# Patient Record
Sex: Female | Born: 1990 | Race: Black or African American | Hispanic: No | Marital: Single | Smoking: Former smoker
Health system: Southern US, Community
[De-identification: ages and names within clinical notes are randomized; demographics above are authoritative.]

## PROBLEM LIST (undated history)

## (undated) ENCOUNTER — Inpatient Hospital Stay (HOSPITAL_COMMUNITY): Payer: Self-pay

## (undated) DIAGNOSIS — Z8619 Personal history of other infectious and parasitic diseases: Secondary | ICD-10-CM

## (undated) DIAGNOSIS — A549 Gonococcal infection, unspecified: Secondary | ICD-10-CM

## (undated) DIAGNOSIS — F319 Bipolar disorder, unspecified: Secondary | ICD-10-CM

## (undated) DIAGNOSIS — F329 Major depressive disorder, single episode, unspecified: Secondary | ICD-10-CM

## (undated) DIAGNOSIS — F419 Anxiety disorder, unspecified: Secondary | ICD-10-CM

## (undated) DIAGNOSIS — A539 Syphilis, unspecified: Secondary | ICD-10-CM

## (undated) DIAGNOSIS — O24419 Gestational diabetes mellitus in pregnancy, unspecified control: Secondary | ICD-10-CM

## (undated) DIAGNOSIS — F29 Unspecified psychosis not due to a substance or known physiological condition: Secondary | ICD-10-CM

## (undated) DIAGNOSIS — F32A Depression, unspecified: Secondary | ICD-10-CM

## (undated) DIAGNOSIS — O30009 Twin pregnancy, unspecified number of placenta and unspecified number of amniotic sacs, unspecified trimester: Secondary | ICD-10-CM

## (undated) HISTORY — PX: TYMPANOSTOMY TUBE PLACEMENT: SHX32

## (undated) HISTORY — DX: Gestational diabetes mellitus in pregnancy, unspecified control: O24.419

## (undated) HISTORY — DX: Personal history of other infectious and parasitic diseases: Z86.19

## (undated) HISTORY — DX: Syphilis, unspecified: A53.9

## (undated) HISTORY — DX: Unspecified psychosis not due to a substance or known physiological condition: F29

## (undated) HISTORY — DX: Gonococcal infection, unspecified: A54.9

---
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
## 2011-01-03 LAB — URINE MICROSCOPIC-ADD ON

## 2011-01-03 LAB — BASIC METABOLIC PANEL
BUN: 6
CO2: 26
Calcium: 10.1
Chloride: 103
Creatinine, Ser: 0.65

## 2011-01-03 LAB — CBC
MCHC: 34.1
MCV: 90.7
Platelets: 266
RBC: 4.41
WBC: 8.5

## 2011-01-03 LAB — RAPID URINE DRUG SCREEN, HOSP PERFORMED: Barbiturates: NOT DETECTED

## 2011-01-03 LAB — DIFFERENTIAL
Basophils Relative: 1
Eosinophils Absolute: 0.1
Neutro Abs: 5.4
Neutrophils Relative %: 63

## 2011-01-03 LAB — URINALYSIS, ROUTINE W REFLEX MICROSCOPIC
Nitrite: NEGATIVE
Specific Gravity, Urine: 1.01

## 2011-01-03 LAB — POCT PREGNANCY, URINE: Preg Test, Ur: NEGATIVE

## 2011-01-03 LAB — ETHANOL: Alcohol, Ethyl (B): <5

## 2011-01-03 LAB — SALICYLATE LEVEL: Salicylate Lvl: <4

## 2011-01-04 LAB — LIPID PANEL
Cholesterol: 126
HDL: 50
LDL Cholesterol: 67
Total CHOL/HDL Ratio: 2.5
Triglycerides: 44
VLDL: 9

## 2011-01-04 LAB — CK: Total CK: 188 — ABNORMAL HIGH

## 2011-01-04 LAB — CORTISOL-AM, BLOOD: Cortisol - AM: 8.2

## 2011-01-11 LAB — BASIC METABOLIC PANEL
BUN: 10
Creatinine, Ser: 0.62
Glucose, Bld: 98
Potassium: 4.2

## 2011-01-11 LAB — DIFFERENTIAL
Basophils Absolute: 0.1
Eosinophils Absolute: 0.1
Eosinophils Relative: 2
Lymphocytes Relative: 36

## 2011-01-11 LAB — RAPID URINE DRUG SCREEN, HOSP PERFORMED
Amphetamines: NOT DETECTED
Benzodiazepines: NOT DETECTED
Cocaine: NOT DETECTED
Opiates: NOT DETECTED
Tetrahydrocannabinol: NOT DETECTED

## 2011-01-11 LAB — URINALYSIS, ROUTINE W REFLEX MICROSCOPIC
Bilirubin Urine: NEGATIVE
Glucose, UA: NEGATIVE
Hgb urine dipstick: NEGATIVE
Nitrite: NEGATIVE
Specific Gravity, Urine: 1.029
pH: 6.5

## 2011-01-11 LAB — CBC
HCT: 39.8
Platelets: 284
RDW: 12.6

## 2011-01-18 LAB — DIFFERENTIAL
Lymphocytes Relative: 14 — ABNORMAL LOW
Lymphs Abs: 1 — ABNORMAL LOW
Neutrophils Relative %: 77 — ABNORMAL HIGH

## 2011-01-18 LAB — URINALYSIS, ROUTINE W REFLEX MICROSCOPIC
Glucose, UA: NEGATIVE
Ketones, ur: NEGATIVE
pH: 6

## 2011-01-18 LAB — RAPID URINE DRUG SCREEN, HOSP PERFORMED
Barbiturates: NOT DETECTED
Benzodiazepines: NOT DETECTED
Cocaine: NOT DETECTED
Opiates: NOT DETECTED

## 2011-01-18 LAB — BASIC METABOLIC PANEL
BUN: 10
Creatinine, Ser: 0.65

## 2011-01-18 LAB — CBC
Platelets: 281
WBC: 7

## 2011-01-18 LAB — POCT PREGNANCY, URINE: Preg Test, Ur: NEGATIVE

## 2011-01-18 LAB — ETHANOL: Alcohol, Ethyl (B): <5

## 2011-02-13 ENCOUNTER — Encounter: Payer: Self-pay | Admitting: *Deleted

## 2011-02-13 ENCOUNTER — Emergency Department (HOSPITAL_COMMUNITY)
Admission: EM | Admit: 2011-02-13 | Discharge: 2011-02-14 | Disposition: A | Discharge status: Home / Self Care | Payer: BC Managed Care – PPO | Attending: Emergency Medicine | Admitting: Emergency Medicine

## 2011-02-13 DIAGNOSIS — F329 Major depressive disorder, single episode, unspecified: Secondary | ICD-10-CM | POA: Insufficient documentation

## 2011-02-13 DIAGNOSIS — F3289 Other specified depressive episodes: Secondary | ICD-10-CM | POA: Insufficient documentation

## 2011-02-13 DIAGNOSIS — F32A Depression, unspecified: Secondary | ICD-10-CM

## 2011-02-13 DIAGNOSIS — R4586 Emotional lability: Secondary | ICD-10-CM | POA: Insufficient documentation

## 2011-02-13 HISTORY — DX: Depression, unspecified: F32.A

## 2011-02-13 HISTORY — DX: Major depressive disorder, single episode, unspecified: F32.9

## 2011-02-13 HISTORY — DX: Anxiety disorder, unspecified: F41.9

## 2011-02-13 LAB — RAPID URINE DRUG SCREEN, HOSP PERFORMED
Benzodiazepines: NOT DETECTED
Cocaine: NOT DETECTED

## 2011-02-13 LAB — COMPREHENSIVE METABOLIC PANEL
BUN: 11 mg/dL (ref 6–23)
Calcium: 9.8 mg/dL (ref 8.4–10.5)
Creatinine, Ser: 0.81 mg/dL (ref 0.50–1.10)
GFR calc Af Amer: >90 mL/min (ref >90)
GFR calc non Af Amer: >90 mL/min (ref >90)
Glucose, Bld: 90 mg/dL (ref 70–99)
Total Protein: 7.8 g/dL (ref 6.0–8.3)

## 2011-02-13 LAB — CBC
HCT: 40.2 % (ref 36.0–46.0)
Hemoglobin: 13.9 g/dL (ref 12.0–15.0)
MCH: 30.9 pg (ref 26.0–34.0)
MCHC: 34.6 g/dL (ref 30.0–36.0)
MCV: 89.3 fL (ref 78.0–100.0)

## 2011-02-13 LAB — ETHANOL: Alcohol, Ethyl (B): <11 mg/dL (ref 0–11)

## 2011-02-13 MED ORDER — ZOLPIDEM TARTRATE 5 MG PO TABS: 5.0000 mg | ORAL_TABLET | Freq: Every evening | ORAL | Status: DC | PRN

## 2011-02-13 MED ORDER — IBUPROFEN 200 MG PO TABS: 600.0000 mg | ORAL_TABLET | Freq: Three times a day (TID) | ORAL | Status: DC | PRN

## 2011-02-13 MED ORDER — ARIPIPRAZOLE 2 MG PO TABS
2.0000 mg | ORAL_TABLET | Freq: Every day | ORAL | Status: DC
  Administered 2011-02-14: 2 mg via ORAL
  Filled 2011-02-13: qty 1

## 2011-02-13 MED ORDER — ACETAMINOPHEN 325 MG PO TABS: 650.0000 mg | ORAL_TABLET | ORAL | Status: DC | PRN

## 2011-02-13 MED ORDER — LORAZEPAM 1 MG PO TABS
1.0000 mg | ORAL_TABLET | Freq: Three times a day (TID) | ORAL | Status: DC | PRN
  Filled 2011-02-13: qty 1
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
## 2011-02-14 NOTE — ED Notes (Signed)
Pt attempting to find another ride home- aware of discharge

## 2011-02-14 NOTE — ED Notes (Signed)
Pt came to desk stating she will go home with someone else other than her mother.  Discussed with ACT. "Ok for this pt to go home however".  Witnessed pt calling and talking to mother.  EDP Hosmer updated on pt current status
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
## 2011-02-14 NOTE — ED Notes (Signed)
Pt family called update given.
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
## 2011-02-14 NOTE — ED Notes (Signed)
Patient denies pain and is resting comfortably.  

## 2011-02-14 NOTE — ED Notes (Signed)
Patient is resting comfortably. 
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
## 2011-02-14 NOTE — ED Notes (Signed)
Informed mother called phone in room- she was informed she could call if she wanted to.  Pt remains calm and cooperative with care- denies SI/HI at present

## 2011-02-14 NOTE — ED Notes (Signed)
Pt up pacing in room singing loudly- asked if was anxious.  Pt denied.  Pt encouraged to verbalize her thoughts.  ACT present to discuss her plan of care.  Pt refused ativan-

## 2011-02-17 ENCOUNTER — Encounter (HOSPITAL_COMMUNITY): Payer: Self-pay | Admitting: Emergency Medicine

## 2011-02-17 ENCOUNTER — Emergency Department (HOSPITAL_COMMUNITY)
Admission: EM | Admit: 2011-02-17 | Discharge: 2011-02-17 | Disposition: A | Discharge status: Home / Self Care | Payer: BC Managed Care – PPO | Attending: Emergency Medicine | Admitting: Emergency Medicine

## 2011-02-17 DIAGNOSIS — F99 Mental disorder, not otherwise specified: Secondary | ICD-10-CM

## 2011-02-17 DIAGNOSIS — F489 Nonpsychotic mental disorder, unspecified: Secondary | ICD-10-CM | POA: Insufficient documentation

## 2011-02-17 DIAGNOSIS — R443 Hallucinations, unspecified: Secondary | ICD-10-CM | POA: Insufficient documentation

## 2011-02-17 HISTORY — DX: Bipolar disorder, unspecified: F31.9

## 2011-02-17 LAB — URINALYSIS, DIPSTICK ONLY
Bilirubin Urine: NEGATIVE
Ketones, ur: NEGATIVE mg/dL
Nitrite: NEGATIVE
Protein, ur: NEGATIVE mg/dL
Urobilinogen, UA: 0.2 mg/dL (ref 0.0–1.0)

## 2011-02-17 LAB — COMPREHENSIVE METABOLIC PANEL
ALT: 26 U/L (ref 0–35)
AST: 28 U/L (ref 0–37)
Albumin: 3.7 g/dL (ref 3.5–5.2)
Alkaline Phosphatase: 44 U/L (ref 39–117)
BUN: 8 mg/dL (ref 6–23)
Chloride: 100 mEq/L (ref 96–112)
Potassium: 3.9 mEq/L (ref 3.5–5.1)
Sodium: 136 mEq/L (ref 135–145)
Total Bilirubin: 0.2 mg/dL — ABNORMAL LOW (ref 0.3–1.2)
Total Protein: 7.4 g/dL (ref 6.0–8.3)

## 2011-02-17 LAB — ETHANOL: Alcohol, Ethyl (B): <11 mg/dL (ref 0–11)

## 2011-02-17 LAB — RAPID URINE DRUG SCREEN, HOSP PERFORMED
Amphetamines: NOT DETECTED
Benzodiazepines: NOT DETECTED
Tetrahydrocannabinol: NOT DETECTED

## 2011-02-17 LAB — CBC
MCH: 29.9 pg (ref 26.0–34.0)
Platelets: 288 10*3/uL (ref 150–400)
RBC: 4.28 MIL/uL (ref 3.87–5.11)
RDW: 12.7 % (ref 11.5–15.5)
WBC: 6.5 10*3/uL (ref 4.0–10.5)

## 2011-02-17 NOTE — ED Notes (Signed)
Pt under IVC

## 2011-02-17 NOTE — ED Notes (Signed)
Pt with ivc paperwork from [facility] pt speaking in unknown language in triage but pt will also answer questions appropriately at times. Pt states it's the baby kicking inside of me.

## 2011-02-17 NOTE — ED Notes (Signed)
Pt presenting to ed from [facility] with c/o psych evaluation per paperwork pt is highly manic and laughing inappropriately. Pt found to be acting strangely talking to herself. Per paperwork pt states she's pregnant

## 2011-02-17 NOTE — ED Provider Notes (Signed)
History     CSN: [id number] Arrival date & time: 02/17/2011  3:15 PM   First MD Initiated Contact with Patient 02/17/11 1719      Chief Complaint  Patient presents with  . Psychiatric Evaluation    (Consider location/radiation/quality/duration/timing/severity/associated sxs/prior treatment) The history is provided by the patient.  pt presents from [facility] with a pt who has been desplaying odd behavior. Singing loudly and inappropriatly. Grabbing her stuff from her room and putting it in the living room. The patient states she is pregnant because she has been feeling the baby kick (patients belly is flat).  Past Medical History  Diagnosis Date  . Depression   . Anxiety   . Bipolar 1 disorder     History reviewed. No pertinent past surgical history.  History reviewed. No pertinent family history.  History  Substance Use Topics  . Smoking status: Current Some Day Smoker  . Smokeless tobacco: Not on file  . Alcohol Use: No    OB History    Grav Para Term Preterm Abortions TAB SAB Ect Mult Living                  Review of Systems  Unable to perform ROS   Allergies  Review of patient's allergies indicates no known allergies.  Home Medications   Current Outpatient Rx  Name Route Sig Dispense Refill  . ARIPIPRAZOLE 2 MG PO TABS Oral Take 2 mg by mouth daily.        BP 101/57  Pulse 98  Temp(Src) 98.5 F (36.9 C) (Oral)  Resp 16  SpO2 100%  LMP 01/15/2011  Physical Exam  Nursing note and vitals reviewed. Constitutional: She appears well-developed and well-nourished.  HENT:  Head: Normocephalic and atraumatic.  Eyes: Conjunctivae are normal. Pupils are equal, round, and reactive to light.  Neck: Trachea normal, normal range of motion and full passive range of motion without pain. Neck supple.  Cardiovascular: Normal rate, regular rhythm and normal pulses.   Pulmonary/Chest: Effort normal and breath sounds normal. Chest wall is not dull to  percussion. She exhibits no tenderness, no crepitus, no edema, no deformity and no retraction.  Abdominal: Soft. Normal appearance and bowel sounds are normal.  Musculoskeletal: Normal range of motion.  Lymphadenopathy:       Head (right side): No submental, no submandibular, no tonsillar, no preauricular, no posterior auricular and no occipital adenopathy present.       Head (left side): No submental, no submandibular, no tonsillar, no preauricular, no posterior auricular and no occipital adenopathy present.    She has no cervical adenopathy.    She has no axillary adenopathy.  Neurological: She is alert. She has normal strength.  Skin: Skin is warm, dry and intact.  Psychiatric: Her speech is normal. She is actively hallucinating. Thought content is delusional. Cognition and memory are normal. She expresses inappropriate judgment.       Pt laughing inappropriatly. She is inattentive.    ED Course  Procedures (including critical care time)  Labs Reviewed  COMPREHENSIVE METABOLIC PANEL - Abnormal; Notable for the following:    Total Bilirubin 0.2 (*)    All other components within normal limits  URINALYSIS, DIPSTICK ONLY - Abnormal; Notable for the following:    Hgb urine dipstick SMALL (*)    Leukocytes, UA SMALL (*)    All other components within normal limits  URINE RAPID DRUG SCREEN (HOSP PERFORMED)  CBC  POCT PREGNANCY, URINE  ETHANOL  POCT PREGNANCY, URINE  No results found.   No diagnosis found.    MDM  No EKG.  Pt is medically cleared to go back to [facility].        Dorthula Matas, PA 02/17/11 Ernestina Columbia

## 2011-02-18 NOTE — ED Provider Notes (Signed)
Medical screening examination/treatment/procedure(s) were performed by non-physician practitioner and as supervising physician I was immediately available for consultation/collaboration.   Benny Lennert, MD 02/18/11 [phone]

## 2011-06-08 ENCOUNTER — Ambulatory Visit (INDEPENDENT_AMBULATORY_CARE_PROVIDER_SITE_OTHER): Payer: BC Managed Care – PPO | Admitting: Family Medicine

## 2011-06-08 VITALS — BP 111/71 | HR 73 | Temp 97.9°F | Resp 18 | Ht 66.0 in | Wt 128.0 lb

## 2011-06-08 DIAGNOSIS — Z113 Encounter for screening for infections with a predominantly sexual mode of transmission: Secondary | ICD-10-CM

## 2011-06-08 LAB — POCT WET PREP WITH KOH
Clue Cells Wet Prep HPF POC: NEGATIVE
KOH Prep POC: NEGATIVE
Trichomonas, UA: NEGATIVE
WBC Wet Prep HPF POC: NEGATIVE
Yeast Wet Prep HPF POC: NEGATIVE

## 2011-06-08 NOTE — Progress Notes (Signed)
  [facility]:  Office Visit  Chief Complaint:  Chief Complaint  Patient presents with  . std check    HPI: Cheryl Cohen is a 21 y.o. female who complains of  STD check. Did not use protection about 4 days ago. She has a ? H/o of RPR and Gonorrhea which has been treated. Denies fevers, chills, joint pain, vaginal dc, dysuria, dysparenia  Past Medical History  Diagnosis Date  . Depression   . Anxiety   . Bipolar 1 disorder   . Syphilis   . Gonorrhea    History reviewed. No pertinent past surgical history. History   Social History  . Marital Status: Single    Spouse Name: N/A    Number of Children: N/A  . Years of Education: N/A   Social History Main Topics  . Smoking status: Former Games developer  . Smokeless tobacco: None  . Alcohol Use: No  . Drug Use: No  . Sexually Active: None   Other Topics Concern  . None   Social History Narrative  . None   Family History  Problem Relation Age of Onset  . Diabetes Mother   . Hypertension Father    No Known Allergies Prior to Admission medications   Medication Sig Start Date End Date Taking? Authorizing Provider  ARIPiprazole (ABILIFY) 2 MG tablet Take 2 mg by mouth daily.     Yes Historical Provider, MD     ROS: The patient denies fevers, chills, night sweats, unintentional weight loss, chest pain, palpitations, wheezing, dyspnea on exertion, nausea, vomiting, abdominal pain, dysuria, hematuria, melena, numbness, weakness, or tingling.   All other systems have been reviewed and were otherwise negative with the exception of those mentioned in the HPI and as above.    PHYSICAL EXAM: Filed Vitals:   06/08/11 1951  BP: 111/71  Pulse: 73  Temp: 97.9 F (36.6 C)  Resp: 18   Filed Vitals:   06/08/11 1951  Height: 5\' 6"  (1.676 m)  Weight: 128 lb (58.06 kg)   Body mass index is 20.66 kg/(m^2).  General: Alert, no acute distress HEENT:  Normocephalic, atraumatic, oropharynx patent.  Cardiovascular:   Regular rate and rhythm, no rubs murmurs or gallops.  No Carotid bruits, radial pulse intact. No pedal edema.  Respiratory: Clear to auscultation bilaterally.  No wheezes, rales, or rhonchi.  No cyanosis, no use of accessory musculature GI: No organomegaly, abdomen is soft and non-tender, positive bowel sounds.  No masses. Skin: No rashes. Neurologic: Facial musculature symmetric. Psychiatric: Patient is appropriate throughout our interaction. Lymphatic: No cervical lymphadenopathy Musculoskeletal: Gait intact. Pelvic Exam: nl , no CMT, no rashes, masses, lesions on labia  ASSESSMENT/PLAN: Encounter Diagnosis  Name Primary?  . Screening for STD (sexually transmitted disease) Yes    1. Wet Prep nl 2. HIV, RPR, G/C urine, HSV pending 3. Encourage condom use  Charene Mccallister PHUONG, DO 06/10/2011 11:26 AM

## 2011-06-10 LAB — SYPHILIS: RPR W/REFLEX TO RPR TITER AND TREPONEMAL ANTIBODIES, TRADITIONAL SCREENING AND DIAGNOSIS ALGORITHM

## 2011-06-10 LAB — HIV ANTIBODY (ROUTINE TESTING W REFLEX): HIV: NONREACTIVE

## 2011-06-10 LAB — GC/CHLAMYDIA PROBE AMP, URINE
Chlamydia, Swab/Urine, PCR: NEGATIVE
GC Probe Amp, Urine: NEGATIVE

## 2011-06-12 ENCOUNTER — Telehealth: Payer: Self-pay | Admitting: Family Medicine

## 2011-06-12 LAB — HSV(HERPES SIMPLEX VRS) I + II AB-IGG
HSV 1 Glycoprotein G Ab, IgG: 0.11 IV
HSV 2 Glycoprotein G Ab, IgG: 0.18 IV

## 2011-06-12 NOTE — Telephone Encounter (Signed)
Left generic message that all test results that we did were negative. She knows what that means since we only did labwork for STD screening.

## 2011-10-27 ENCOUNTER — Inpatient Hospital Stay (HOSPITAL_COMMUNITY)
Admission: AD | Admit: 2011-10-27 | Discharge: 2011-10-27 | Disposition: A | Discharge status: Home / Self Care | Payer: BC Managed Care – PPO | Source: Ambulatory Visit | Attending: Obstetrics and Gynecology | Admitting: Obstetrics and Gynecology

## 2011-10-27 DIAGNOSIS — A5400 Gonococcal infection of lower genitourinary tract, unspecified: Secondary | ICD-10-CM | POA: Insufficient documentation

## 2011-10-27 MED ORDER — CEFTRIAXONE SODIUM 250 MG IJ SOLR
250.0000 mg | Freq: Once | INTRAMUSCULAR | Status: AC
  Administered 2011-10-27: 250 mg via INTRAMUSCULAR
  Filled 2011-10-27: qty 250

## 2011-10-27 MED ORDER — AZITHROMYCIN 1 G PO PACK
1.0000 g | PACK | Freq: Once | ORAL | Status: AC
  Administered 2011-10-27: 1 g via ORAL
  Filled 2011-10-27: qty 1

## 2011-10-27 NOTE — MAU Note (Signed)
Tolerated injection.  Handout for gonorrhea given.  Stressed importance of partner treatment.    NO sex until partner treated for at least 2 wks.

## 2012-01-11 ENCOUNTER — Ambulatory Visit (INDEPENDENT_AMBULATORY_CARE_PROVIDER_SITE_OTHER): Payer: BC Managed Care – PPO | Admitting: Internal Medicine

## 2012-01-11 ENCOUNTER — Encounter: Payer: Self-pay | Admitting: Internal Medicine

## 2012-01-11 VITALS — BP 102/66 | HR 82 | Temp 98.2°F | Ht 66.5 in | Wt 146.0 lb

## 2012-01-11 DIAGNOSIS — Z299 Encounter for prophylactic measures, unspecified: Secondary | ICD-10-CM

## 2012-01-11 DIAGNOSIS — R5381 Other malaise: Secondary | ICD-10-CM

## 2012-01-11 DIAGNOSIS — R404 Transient alteration of awareness: Secondary | ICD-10-CM

## 2012-01-11 DIAGNOSIS — R400 Somnolence: Secondary | ICD-10-CM | POA: Insufficient documentation

## 2012-01-11 DIAGNOSIS — Z833 Family history of diabetes mellitus: Secondary | ICD-10-CM

## 2012-01-11 DIAGNOSIS — Z23 Encounter for immunization: Secondary | ICD-10-CM

## 2012-01-11 DIAGNOSIS — R5383 Other fatigue: Secondary | ICD-10-CM

## 2012-01-11 DIAGNOSIS — F1910 Other psychoactive substance abuse, uncomplicated: Secondary | ICD-10-CM

## 2012-01-11 DIAGNOSIS — F1990 Other psychoactive substance use, unspecified, uncomplicated: Secondary | ICD-10-CM

## 2012-01-11 DIAGNOSIS — F319 Bipolar disorder, unspecified: Secondary | ICD-10-CM

## 2012-01-11 HISTORY — DX: Family history of diabetes mellitus: Z83.3

## 2012-01-11 LAB — CBC WITH DIFFERENTIAL/PLATELET
Basophils Relative: 0.4 % (ref 0.0–3.0)
HCT: 41.4 % (ref 36.0–46.0)
Hemoglobin: 13.6 g/dL (ref 12.0–15.0)
Lymphocytes Relative: 24.7 % (ref 12.0–46.0)
Lymphs Abs: 1.8 10*3/uL (ref 0.7–4.0)
MCHC: 32.9 g/dL (ref 30.0–36.0)
Monocytes Relative: 10.2 % (ref 3.0–12.0)
Neutro Abs: 4.5 10*3/uL (ref 1.4–7.7)
RBC: 4.42 Mil/uL (ref 3.87–5.11)
RDW: 12.9 % (ref 11.5–14.6)

## 2012-01-11 LAB — HEPATIC FUNCTION PANEL
AST: 18 U/L (ref 0–37)
Albumin: 4 g/dL (ref 3.5–5.2)
Alkaline Phosphatase: 34 U/L — ABNORMAL LOW (ref 39–117)
Total Protein: 8 g/dL (ref 6.0–8.3)

## 2012-01-11 LAB — HEMOGLOBIN A1C: Hgb A1c MFr Bld: 5.6 % (ref 4.6–6.5)

## 2012-01-11 LAB — T4, FREE: Free T4: 0.88 ng/dL (ref 0.60–1.60)

## 2012-01-11 LAB — BASIC METABOLIC PANEL
CO2: 29 mEq/L (ref 19–32)
Calcium: 9.8 mg/dL (ref 8.4–10.5)
GFR: 106.96 mL/min (ref >60.00)
Glucose, Bld: 86 mg/dL (ref 70–99)
Potassium: 4.4 mEq/L (ref 3.5–5.1)
Sodium: 138 mEq/L (ref 135–145)

## 2012-01-11 LAB — TSH: TSH: 1.06 u[IU]/mL (ref 0.35–5.50)

## 2012-01-11 LAB — LIPID PANEL: Cholesterol: 153 mg/dL (ref 0–200)

## 2012-01-11 NOTE — Progress Notes (Signed)
Subjective:    Patient ID: Cheryl Cohen, female    DOB: 1990/09/30, 21 y.o.   MRN: [id number]  HPI Patient comes in as new patient visit . Previous care was  Various places [facility]  and Dr Phillip Heal her psychiatrist who has rxed her fro bipolar disorder since 9th grade . She has been hospitalized for this with aggression mania and depression in the past   Time in fall of 2012. Here with  Mom today .Mom concern  About sleepiness.  Sleeps up to 12 hours at times   Last  Hops 2012   For 14 days and hard for school and had to fail courses  With missed school  In choir  And working . Now functioning fairly well Psych said not the meds but not really addressed recently.  No new meds  Denies subs use use . No Fever like mono  There is a family hx of dm and  Lipids and etoh issues  Review of Systems ROS:  GEN/ HEENT: No fever, significant weight changes sweats headaches vision problems hearing changes, CV/ PULM; No chest pain shortness of breath cough, syncope,edema  change in exercise tolerance. GI /GU: No adominal pain, vomiting, change in bowel habits. No blood in the stool. No significant GU symptoms. At this time has ocass lower pelvic pain  rx for gc by gyne and recheck was negative no new partners SKIN/HEME: ,no acute skin rashes suspicious lesions or bleeding. No lymphadenopathy, nodules, masses.  NEURO/ PSYCH:  No neurologic signs such as weakness numbness. IMM/ Allergy: No unusual infections.  Allergy .   REST of 12 system review negative except as per HPI Past Medical History  Diagnosis Date  . Depression   . Anxiety   . Bipolar 1 disorder     hospitalized  3 x onset rx in 9th grade   . Syphilis   . Gonorrhea   . Hx of varicella     History   Social History  . Marital Status: Single    Spouse Name: N/A    Number of Children: N/A  . Years of Education: N/A   Occupational History  . Not on file.   Social History Main Topics  . Smoking status: Never Smoker   .  Smokeless tobacco: Not on file  . Alcohol Use: No  . Drug Use: No  . Sexually Active: Not on file   Other Topics Concern  . 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 Tympanostomy tube placement     asage 2-3 yrs    Family History  Problem Relation Age of Onset  . Diabetes Mother   . Arthritis Mother   . Hypertension Father   . Alcohol abuse Father     No Known Allergies  Current Outpatient Prescriptions on File Prior to Visit  Medication Sig Dispense Refill  . Norgestim-Eth Estrad Triphasic (TRINESSA, 28, PO) Take 1 tablet by mouth daily.      . ARIPiprazole (ABILIFY) 2 MG tablet Take 2 mg by mouth daily.        now on 15 mg abilify  BP 102/66  Pulse 82  Temp 98.2 F (36.8 C) (Oral)  Ht 5' 6.5" (1.689 m)  Wt 146 lb (66.225 kg)  BMI 23.21 kg/m2  SpO2 97%  LMP 12/31/2011     Objective:   Physical Exam WDWN in nad slightly drowsy smiles nl speech and affect. HEENT: Normocephalic ;atraumatic , Eyes;  PERRL, EOMs  Full, lids and conjunctiva clear,,Ears: no deformities, canals nl, TM landmarks normal, Nose: no deformity or discharge  Mouth : OP clear without lesion or edema . Neck: Supple without adenopathy or masses or bruits Chest:  Clear to A&P without wheezes rales or rhonchi CV:  S1-S2 no gallops or murmurs peripheral perfusion is normal Abdomen:  Sof,t normal bowel sounds without hepatosplenomegaly, no guarding rebound or masses no CVA tenderness No clubbing cyanosis or edema Skin: normal capillary refill ,turgor , color: No acute rashes ,petechiae or bruising Good eye contact  MS exam: normal;  No scoliosis ,LOM , joint swelling or gait disturbance . Muscle mass is normal .     Assessment & Plan:   Sleepiness ? Hypersomnia  In setting of rx for bipolar  ? If med adding to this but should talk with  psych about this  Certainly no change in med without input.  Want to avoid relapse and interfere ing with school and job at present.  Non focal exam disc prevention risk.  Check labs for r/o metabolic thyroid etc.   Bipolar  Hx of hosp x 3 at least   Continue with psych care disc transitioning to adult psych names given.  fam hx dm  Total visit > 50% spent counseling and coordinating care  Review of data

## 2012-01-11 NOTE — Patient Instructions (Signed)
Will notify you  of labs when available. Continue impaired attention to regular exercise 150 minutes per week including aerobic and resistance. This can help sleep energy level. It is also good to prevent diabetes  Please pay attention to the sugar and processed carbohydrates in your diet and limit them. Again this is to prevent diabetes and may also help your energy level.  Would ask psychiatrist to see Abilify 15 mg could be adding to your sleepiness. At this time only a psychiatrist should change or adjust medication. Review benefit risk of any medicines.

## 2012-01-12 LAB — EPSTEIN-BARR VIRUS VCA ANTIBODY PANEL
EBV EA IgG: <5 U/mL (ref <9.0)
EBV VCA IgG: <10 U/mL (ref <18.0)
EBV VCA IgM: <10 U/mL (ref <36.0)

## 2012-01-13 ENCOUNTER — Encounter: Payer: Self-pay | Admitting: Internal Medicine

## 2012-01-13 DIAGNOSIS — F319 Bipolar disorder, unspecified: Secondary | ICD-10-CM | POA: Insufficient documentation

## 2012-01-13 DIAGNOSIS — F1910 Other psychoactive substance abuse, uncomplicated: Secondary | ICD-10-CM | POA: Insufficient documentation

## 2012-01-13 DIAGNOSIS — F1990 Other psychoactive substance use, unspecified, uncomplicated: Secondary | ICD-10-CM | POA: Insufficient documentation

## 2012-01-26 ENCOUNTER — Other Ambulatory Visit: Payer: Self-pay | Admitting: Obstetrics and Gynecology

## 2012-01-26 DIAGNOSIS — N6310 Unspecified lump in the right breast, unspecified quadrant: Secondary | ICD-10-CM

## 2012-02-09 ENCOUNTER — Ambulatory Visit
Admission: RE | Admit: 2012-02-09 | Discharge: 2012-02-09 | Disposition: A | Discharge status: Home / Self Care | Payer: Medicaid Other | Source: Ambulatory Visit | Attending: Obstetrics and Gynecology | Admitting: Obstetrics and Gynecology

## 2012-02-09 DIAGNOSIS — N6310 Unspecified lump in the right breast, unspecified quadrant: Secondary | ICD-10-CM

## 2012-07-28 ENCOUNTER — Ambulatory Visit (INDEPENDENT_AMBULATORY_CARE_PROVIDER_SITE_OTHER): Payer: BC Managed Care – PPO | Admitting: Emergency Medicine

## 2012-07-28 VITALS — BP 110/70 | HR 101 | Temp 97.9°F | Resp 16 | Ht 66.0 in | Wt 137.8 lb

## 2012-07-28 DIAGNOSIS — R102 Pelvic and perineal pain: Secondary | ICD-10-CM

## 2012-07-28 DIAGNOSIS — M25559 Pain in unspecified hip: Secondary | ICD-10-CM

## 2012-07-28 LAB — POCT URINALYSIS DIPSTICK
Ketones, UA: 15
Protein, UA: 300
Urobilinogen, UA: 1
pH, UA: 6

## 2012-07-28 LAB — POCT UA - MICROSCOPIC ONLY
Casts, Ur, LPF, POC: NEGATIVE
Crystals, Ur, HPF, POC: NEGATIVE
Yeast, UA: NEGATIVE

## 2012-07-28 MED ORDER — PHENAZOPYRIDINE HCL 200 MG PO TABS
200.0000 mg | ORAL_TABLET | Freq: Three times a day (TID) | ORAL | Status: DC | PRN
Start: 2012-07-28 — End: 2013-04-23

## 2012-07-28 MED ORDER — SULFAMETHOXAZOLE-TRIMETHOPRIM 800-160 MG PO TABS: 1.0000 | ORAL_TABLET | Freq: Two times a day (BID) | ORAL | Status: DC

## 2012-07-28 NOTE — Patient Instructions (Addendum)
Urinary Tract Infection Urinary tract infections (UTIs) can develop anywhere along your urinary tract. Your urinary tract is your body's drainage system for removing wastes and extra water. Your urinary tract includes two kidneys, two ureters, a bladder, and a urethra. Your kidneys are a pair of bean-shaped organs. Each kidney is about the size of your fist. They are located below your ribs, one on each side of your spine. CAUSES Infections are caused by microbes, which are microscopic organisms, including fungi, viruses, and bacteria. These organisms are so small that they can only be seen through a microscope. Bacteria are the microbes that most commonly cause UTIs. SYMPTOMS  Symptoms of UTIs may vary by age and gender of the patient and by the location of the infection. Symptoms in young women typically include a frequent and intense urge to urinate and a painful, burning feeling in the bladder or urethra during urination. Older women and men are more likely to be tired, shaky, and weak and have muscle aches and abdominal pain. A fever may mean the infection is in your kidneys. Other symptoms of a kidney infection include pain in your back or sides below the ribs, nausea, and vomiting. DIAGNOSIS To diagnose a UTI, your caregiver will ask you about your symptoms. Your caregiver also will ask to provide a urine sample. The urine sample will be tested for bacteria and white blood cells. White blood cells are made by your body to help fight infection. TREATMENT  Typically, UTIs can be treated with medication. Because most UTIs are caused by a bacterial infection, they usually can be treated with the use of antibiotics. The choice of antibiotic and length of treatment depend on your symptoms and the type of bacteria causing your infection. HOME CARE INSTRUCTIONS  If you were prescribed antibiotics, take them exactly as your caregiver instructs you. Finish the medication even if you feel better after you  have only taken some of the medication.  Drink enough water and fluids to keep your urine clear or pale yellow.  Avoid caffeine, tea, and carbonated beverages. They tend to irritate your bladder.  Empty your bladder often. Avoid holding urine for long periods of time.  Empty your bladder before and after sexual intercourse.  After a bowel movement, women should cleanse from front to back. Use each tissue only once. SEEK MEDICAL CARE IF:   You have back pain.  You develop a fever.  Your symptoms do not begin to resolve within 3 days. SEEK IMMEDIATE MEDICAL CARE IF:   You have severe back pain or lower abdominal pain.  You develop chills.  You have nausea or vomiting.  You have continued burning or discomfort with urination. MAKE SURE YOU:   Understand these instructions.  Will watch your condition.  Will get help right away if you are not doing well or get worse. Document Released: 12/28/2004 Document Revised: 09/19/2011 Document Reviewed: 04/28/2011 Exit[employer] Patient Information 2013 [employer].  

## 2012-07-28 NOTE — Progress Notes (Signed)
[facility] [address] [place] [address] [phone]  Date:  07/28/2012   Name:  Cheryl Cohen   DOB:  Mar 31, 1991   MRN:  [id number]  PCP:  Lorretta Harp, MD    Chief Complaint: Abdominal Pain, Back Pain and Possible Pregnancy   History of Present Illness:  Cheryl Cohen is a 22 y.o. very pleasant female patient who presents with the following:  Friday developed pain in lower abdomen and pelvis. Radiates into back.  No dysuria or frequency but having urgency.  On depo provera and current on shots.  Some spotting now. No dyspareunia or discharge.  No nausea or vomiting.  No fever or chills.  Pain worse with standing and walking.   No stool change.  The patient has no complaint of blood, mucous, or pus in her stools.   Patient Active Problem List  Diagnosis  . Sleepiness  . Fatigue  . Family history of diabetes mellitus type II  . Bipolar 1 disorder  . Psychotropic drug use    Past Medical History  Diagnosis Date  . Depression   . Anxiety   . Bipolar 1 disorder     hospitalized  3 x onset rx in 9th grade   . Syphilis   . Gonorrhea   . Hx of varicella     Past Surgical History  Procedure Laterality Date  . Tympanostomy tube placement      asage 2-3 yrs    History  Substance Use Topics  . Smoking status: Never Smoker   . Smokeless tobacco: Not on file  . Alcohol Use: No    Family History  Problem Relation Age of Onset  . Diabetes Mother   . Arthritis Mother   . Hypertension Father   . Alcohol abuse Father     No Known Allergies  Medication list has been reviewed and updated.  Current Outpatient Prescriptions on File Prior to Visit  Medication Sig Dispense Refill  . ARIPiprazole (ABILIFY) 2 MG tablet Take 2 mg by mouth daily.        Lorita Officer Triphasic (TRINESSA, 28, PO) Take 1 tablet by mouth daily.       No current facility-administered medications on file prior to visit.    Review of Systems:  As  per HPI, otherwise negative.    Physical Examination: Filed Vitals:   07/28/12 1042  BP: 110/70  Pulse: 101  Temp: 97.9 F (36.6 C)  Resp: 16   Filed Vitals:   07/28/12 1042  Height: 5\' 6"  (1.676 m)  Weight: 137 lb 12.8 oz (62.506 kg)   Body mass index is 22.25 kg/(m^2). Ideal Body Weight: Weight in (lb) to have BMI = 25: 154.6  GEN: WDWN, NAD, Non-toxic, A & O x 3 HEENT: Atraumatic, Normocephalic. Neck supple. No masses, No LAD. Ears and Nose: No external deformity. CV: RRR, No M/G/R. No JVD. No thrill. No extra heart sounds. PULM: CTA B, no wheezes, crackles, rhonchi. No retractions. No resp. distress. No accessory muscle use. ABD: S, NT, ND, +BS. No rebound. No HSM. EXTR: No c/c/e NEURO Normal gait.  PSYCH: Normally interactive. Conversant. Not depressed or anxious appearing.  Calm demeanor.    Assessment and Plan: Acute cystitis Septra Pyridium   Signed,  Phillips Odor, MD   Results for orders placed in visit on 07/28/12  POCT UA - MICROSCOPIC ONLY      Result Value Range   WBC, Ur, HPF, POC TNTC  RBC, urine, microscopic 10-15     Bacteria, U Microscopic 1+     Mucus, UA small     Epithelial cells, urine per micros 3-6     Crystals, Ur, HPF, POC neg     Casts, Ur, LPF, POC neg     Yeast, UA neg    POCT URINALYSIS DIPSTICK      Result Value Range   Color, UA yellow     Clarity, UA cloudy     Glucose, UA neg     Bilirubin, UA neg     Ketones, UA 15     Spec Grav, UA 1.025     Blood, UA large     pH, UA 6.0     Protein, UA 300     Urobilinogen, UA 1.0     Nitrite, UA postive     Leukocytes, UA moderate (2+)    POCT URINE PREGNANCY      Result Value Range   Preg Test, Ur Negative

## 2012-07-29 ENCOUNTER — Telehealth: Payer: Self-pay | Admitting: Internal Medicine

## 2012-07-29 NOTE — Telephone Encounter (Signed)
Cheryl Cohen/Mother's call follows UC 07/28/12 diagnosed with UTI, T 100.  1 now, back pain continues; using Ibuprofen and pain med, antibiotic.  Wants to make sure this is normal.

## 2012-07-29 NOTE — Telephone Encounter (Signed)
Patient notified to contact urgent care for advise since she was treated there.  Was advised to come in and be seen in no better in a few days.  Advised she has only been on the antibiotic for 1 day.  Full effect has not began.  Not surprised she is having back pain today.  Again, advised to come in for an office visit in a few days if not doing better or getting worse.

## 2013-01-01 ENCOUNTER — Ambulatory Visit (INDEPENDENT_AMBULATORY_CARE_PROVIDER_SITE_OTHER): Payer: BC Managed Care – PPO | Admitting: Emergency Medicine

## 2013-01-01 VITALS — BP 100/60 | HR 90 | Temp 98.0°F | Resp 16 | Ht 66.5 in | Wt 131.2 lb

## 2013-01-01 DIAGNOSIS — J039 Acute tonsillitis, unspecified: Secondary | ICD-10-CM

## 2013-01-01 MED ORDER — PENICILLIN V POTASSIUM 500 MG PO TABS: 500.0000 mg | ORAL_TABLET | Freq: Four times a day (QID) | ORAL | Status: DC

## 2013-01-01 NOTE — Progress Notes (Signed)
[facility] [address] [place]  [place] [address] [phone]  Date:  01/01/2013   Name:  Cheryl Cohen   DOB:  Nov 03, 1990   MRN:  [id number]  PCP:  Lorretta Harp, MD    Chief Complaint: Sore Throat   History of Present Illness:  Cheryl Cohen is a 22 y.o. very pleasant female patient who presents with the following:  Ill for the week with sore throat and swollen lymph nodes.  Pain with swallowing.  No rash or cough.  No wheezing or shortness of breath.  No coryza.  Thinks she had a fever but none measured.  No improvement with over the counter medications or other home remedies. Denies other complaint or health concern today.   Patient Active Problem List   Diagnosis Date Noted  . Psychotropic drug use 01/13/2012  . Bipolar 1 disorder   . Sleepiness 01/11/2012  . Fatigue 01/11/2012  . Family history of diabetes mellitus type II 01/11/2012    Past Medical History  Diagnosis Date  . Depression   . Anxiety   . Bipolar 1 disorder     hospitalized  3 x onset rx in 9th grade   . Syphilis   . Gonorrhea   . Hx of varicella     Past Surgical History  Procedure Laterality Date  . Tympanostomy tube placement      asage 2-3 yrs    History  Substance Use Topics  . Smoking status: Never Smoker   . Smokeless tobacco: Not on file  . Alcohol Use: No    Family History  Problem Relation Age of Onset  . Diabetes Mother   . Arthritis Mother   . Hypertension Father   . Alcohol abuse Father     No Known Allergies  Medication list has been reviewed and updated.  Current Outpatient Prescriptions on File Prior to Visit  Medication Sig Dispense Refill  . ARIPiprazole (ABILIFY) 2 MG tablet Take 2 mg by mouth daily.        . medroxyPROGESTERone (DEPO-PROVERA) 150 MG/ML injection Inject 150 mg into the muscle every 3 (three) months.      Lorita Officer Triphasic (TRINESSA, 28, PO) Take 1 tablet by mouth daily.      . phenazopyridine (PYRIDIUM)  200 MG tablet Take 1 tablet (200 mg total) by mouth 3 (three) times daily as needed for pain.  6 tablet  0  . sulfamethoxazole-trimethoprim (BACTRIM DS,SEPTRA DS) 800-160 MG per tablet Take 1 tablet by mouth 2 (two) times daily.  20 tablet  0   No current facility-administered medications on file prior to visit.    Review of Systems:  As per HPI, otherwise negative.    Physical Examination: Filed Vitals:   01/01/13 1316  BP: 100/60  Pulse: 90  Temp: 98 F (36.7 C)  Resp: 16   Filed Vitals:   01/01/13 1316  Height: 5' 6.5" (1.689 m)  Weight: 131 lb 3.2 oz (59.512 kg)   Body mass index is 20.86 kg/(m^2). Ideal Body Weight: Weight in (lb) to have BMI = 25: 156.9  GEN: WDWN, NAD, Non-toxic, A & O x 3 HEENT: Atraumatic, Normocephalic. Neck supple. No masses, anterior cervical LAD.  Right exudative tonsillitis Ears and Nose: No external deformity. CV: RRR, No M/G/R. No JVD. No thrill. No extra heart sounds. PULM: CTA B, no wheezes, crackles, rhonchi. No retractions. No resp. distress. No accessory muscle use. ABD: S, NT, ND, +BS. No rebound. No HSM.  EXTR: No c/c/e NEURO Normal gait.  PSYCH: Normally interactive. Conversant. Not depressed or anxious appearing.  Calm demeanor.    Assessment and Plan: Tonsillitis Pen v k Tylenol   Signed,  Phillips Odor, MD

## 2013-01-01 NOTE — Patient Instructions (Addendum)
Tonsillitis  Tonsils are lumps of lymphoid tissues at the back of the throat. Each tonsil has 20 crevices (crypts). Tonsils help fight nose and throat infections and keep infection from spreading to other parts of the body for the first 18 months of life. Tonsillitis is an infection of the throat that causes the tonsils to become red, tender, and swollen.  CAUSES  Sudden and, if treated, temporary (acute) tonsillitis is usually caused by infection with streptococcal bacteria. Long lasting (chronic) tonsillitis occurs when the crypts of the tonsils become filled with pieces of food and bacteria, which makes it easy for the tonsils to become constantly infected.  SYMPTOMS   Symptoms of tonsillitis include:   A sore throat.   White patches on the tonsils.   Fever.   Tiredness.  DIAGNOSIS  Tonsillitis can be diagnosed through a physical exam. Diagnosis can be confirmed with the results of lab tests, including a throat culture.  TREATMENT   The goals of tonsillitis treatment include the reduction of the severity and duration of symptoms, prevention of associated conditions, and prevention of disease transmission. Tonsillitis caused by bacteria can be treated with antibiotics. Usually, treatment with antibiotics is started before the cause of the tonsillitis is known. However, if it is determined that the cause is not bacterial, antibiotics will not treat the tonsillitis. If attacks of tonsillitis are severe and frequent, your caregiver may recommend surgery to remove the tonsils (tonsillectomy).  HOME CARE INSTRUCTIONS    Rest as much as possible and get plenty of sleep.   Drink plenty of fluids. While the throat is very sore, eat soft foods or liquids, such as sherbet, soups, or instant breakfast drinks.   Eat frozen ice pops.   Older children and adults may gargle with a warm or cold liquid to help soothe the throat. Mix 1 teaspoon of salt in 1 cup of water.   Other family members who also develop a sore  throat or fever should have a medical exam or throat culture.   Only take over-the-counter or prescription medicines for pain, discomfort, or fever as directed by your caregiver.   If you are given antibiotics, take them as directed. Finish them even if you start to feel better.  SEEK MEDICAL CARE IF:    Your baby is older than 3 months with a rectal temperature of 100.5 F (38.1 C) or higher for more than 1 day.   Large, tender lumps develop in your neck.   A rash develops.   Green, yellow-brown, or bloody substance is coughed up.   You are unable to swallow liquids or food for 24 hours.   Your child is unable to swallow food or liquids for 12 hours.  SEEK IMMEDIATE MEDICAL CARE IF:    You develop any new symptoms such as vomiting, severe headache, stiff neck, chest pain, or trouble breathing or swallowing.   You have severe throat pain along with drooling or voice changes.   You have severe pain, unrelieved with recommended medications.   You are unable to fully open the mouth.   You develop redness, swelling, or severe pain anywhere in the neck.   You have a fever.   Your baby is older than 3 months with a rectal temperature of 102 F (38.9 C) or higher.   Your baby is 3 months old or younger with a rectal temperature of 100.4 F (38 C) or higher.  MAKE SURE YOU:    Understand these instructions.   Will 

## 2013-04-23 ENCOUNTER — Ambulatory Visit (INDEPENDENT_AMBULATORY_CARE_PROVIDER_SITE_OTHER): Payer: BC Managed Care – PPO | Admitting: Physician Assistant

## 2013-04-23 VITALS — BP 96/66 | HR 109 | Temp 98.5°F | Resp 16 | Ht 66.0 in | Wt 126.0 lb

## 2013-04-23 DIAGNOSIS — R059 Cough, unspecified: Secondary | ICD-10-CM

## 2013-04-23 DIAGNOSIS — R5381 Other malaise: Secondary | ICD-10-CM

## 2013-04-23 DIAGNOSIS — R5383 Other fatigue: Secondary | ICD-10-CM

## 2013-04-23 DIAGNOSIS — D7289 Other specified disorders of white blood cells: Secondary | ICD-10-CM

## 2013-04-23 DIAGNOSIS — R05 Cough: Secondary | ICD-10-CM

## 2013-04-23 DIAGNOSIS — J020 Streptococcal pharyngitis: Secondary | ICD-10-CM

## 2013-04-23 DIAGNOSIS — J029 Acute pharyngitis, unspecified: Secondary | ICD-10-CM

## 2013-04-23 LAB — POCT CBC
Granulocyte percent: 76.3 %G (ref 37–80)
HCT, POC: 44.5 % (ref 37.7–47.9)
HEMOGLOBIN: 14.3 g/dL (ref 12.2–16.2)
LYMPH, POC: 2 (ref 0.6–3.4)
MCH: 30.4 pg (ref 27–31.2)
MCHC: 32.1 g/dL (ref 31.8–35.4)
MCV: 94.7 fL (ref 80–97)
MID (CBC): 1.1 — AB (ref 0–0.9)
MPV: 8.2 fL (ref 0–99.8)
POC Granulocyte: 10.1 — AB (ref 2–6.9)
POC LYMPH PERCENT: 15.2 %L (ref 10–50)
POC MID %: 8.5 % (ref 0–12)
Platelet Count, POC: 212 10*3/uL (ref 142–424)
RBC: 4.7 M/uL (ref 4.04–5.48)
RDW, POC: 13.4 %
WBC: 13.2 10*3/uL — AB (ref 4.6–10.2)

## 2013-04-23 LAB — POCT RAPID STREP A (OFFICE): Rapid Strep A Screen: POSITIVE — AB

## 2013-04-23 MED ORDER — AMOXICILLIN 875 MG PO TABS: 875.0000 mg | ORAL_TABLET | Freq: Two times a day (BID) | ORAL | Status: DC

## 2013-04-23 NOTE — Progress Notes (Signed)
Subjective:    Patient ID: Cheryl Cohen, female    DOB: July 25, 1990, 23 y.o.   MRN: [id number]  HPI Patient ID: Cheryl GrahamMiya B Cohen MRN: [id number], DOB: July 25, 1990, 22 y.o. Date of Encounter: 04/23/2013, 9:46 AM  Primary Physician: Lorretta HarpPANOSH,WANDA KOTVAN, MD  Chief Complaint:  Chief Complaint  Patient presents with  . Sore Throat    since this weekend, exposed to URI    HPI: 23 y.o. female presents with a 3-4 day history of sore throat, post nasal drip, cough, otalgia, fever, chills, and fatigue. Subjective fever and chills. No congestion, rhinorrhea, sinus pressure, or headache. Cough is not productive and not associated with time of day. No SOB or wheezing. Normal hearing. No GI complaints. Able to swallow saliva, but hurts to do so. Right side of the throat hurts worse than the left. Decreased appetite secondary to sore throat. Two known sick contacts, both with URI's. No known strep contacts.   PMH: Past Medical History  Diagnosis Date  . Depression   . Anxiety   . Bipolar 1 disorder     hospitalized  3 x onset rx in 9th grade   . Syphilis   . Gonorrhea   . Hx of varicella      Home Meds: Prior to Admission medications   Medication Sig Start Date End Date Taking? Authorizing Provider  ARIPiprazole (ABILIFY) 2 MG tablet Take 2 mg by mouth daily.     Yes Historical Provider, MD  medroxyPROGESTERone (DEPO-PROVERA) 150 MG/ML injection Inject 150 mg into the muscle every 3 (three) months.   No Historical Provider, MD  Norgestim-Eth Estrad Triphasic (TRINESSA, 28, PO) Take 1 tablet by mouth daily.   No Bing Plumehomas F Henley, MD    Allergies: No Known Allergies  History   Social History  . Marital Status: Single    Spouse Name: N/A    Number of Children: N/A  . Years of Education: N/A   Occupational History  . Not on file.   Social History Main Topics  . Smoking status: Current Every Day Smoker    Types: Cigarettes  . Smokeless tobacco: Not on file     Comment: 1 cigarette a  day  . Alcohol Use: No  . Drug Use: No  . Sexual Activity: Not on file   Other Topics Concern  . 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 Negative for appetite change.  HENT: Positive for ear pain, postnasal drip and sore throat. Negative for congestion, rhinorrhea and sinus pressure.        Otalgia-right more than left  Respiratory: Positive for cough. Negative for shortness of breath and wheezing.        Dry cough. Cough not associate with time of day.  Gastrointestinal: Negative for nausea, vomiting and diarrhea.  Musculoskeletal: Positive for myalgias.  Neurological: Negative for headaches.       Objective:   Physical Exam  Physical Exam: Blood pressure 96/66, pulse 109, temperature 98.5 F (36.9 C), temperature source Oral, resp. rate 16, height 5\' 6"  (1.676 m), weight 126 lb (57.153 kg), last menstrual period 04/07/2013, SpO2 98.00%., Body mass index is  20.35 kg/(m^2). General: Well developed, well nourished, in no acute distress. Head: Normocephalic, atraumatic, eyes without discharge, sclera non-icteric, nares are patent. Bilateral auditory canals clear, TM's are without perforation, pearly grey with reflective cone of light bilaterally. No sinus TTP. Oral cavity moist, dentition normal. Posterior pharynx erythematous with post nasal drip. Tonsils 2+ bilaterally. No peritonsillar abscess or tonsillar exudate. Uvula midline.  Neck: Supple. No thyromegaly. Full ROM. Lymph nodes: less than 2 cm AC bilaterally. Lungs: Clear bilaterally to auscultation without wheezes, rales, or rhonchi. Breathing is unlabored. Heart: RRR with S1 S2. No murmurs, rubs, or gallops appreciated. Msk:  Strength and tone normal for age. Extremities: No  clubbing or cyanosis. No edema. Neuro: Alert and oriented X 3. Moves all extremities spontaneously. CNII-XII grossly in tact. Psych:  Responds to questions appropriately with a normal affect.   Labs: Results for orders placed in visit on 04/23/13  POCT CBC      Result Value Range   WBC 13.2 (*) 4.6 - 10.2 K/uL   Lymph, poc 2.0  0.6 - 3.4   POC LYMPH PERCENT 15.2  10 - 50 %L   MID (cbc) 1.1 (*) 0 - 0.9   POC MID % 8.5  0 - 12 %M   POC Granulocyte 10.1 (*) 2 - 6.9   Granulocyte percent 76.3  37 - 80 %G   RBC 4.70  4.04 - 5.48 M/uL   Hemoglobin 14.3  12.2 - 16.2 g/dL   HCT, POC 81.1  91.4 - 47.9 %   MCV 94.7  80 - 97 fL   MCH, POC 30.4  27 - 31.2 pg   MCHC 32.1  31.8 - 35.4 g/dL   RDW, POC 78.2     Platelet Count, POC 212  142 - 424 K/uL   MPV 8.2  0 - 99.8 fL  POCT RAPID STREP A (OFFICE)      Result Value Range   Rapid Strep A Screen Positive (*) Negative       Assessment & Plan:  23 year old female with strep pharyngitis and leukocytosis  -Amoxicillin 875 mg 1 po bid #20 no RF -New toothbrush -Tylenol/Motrin -Rest/fluids -RTC precautions   Eula Listen, MHS, PA-C [facility] [address] [place], [place] [address] [phone] [facility] 04/23/2013 9:46 AM

## 2013-05-02 ENCOUNTER — Ambulatory Visit (INDEPENDENT_AMBULATORY_CARE_PROVIDER_SITE_OTHER): Payer: BC Managed Care – PPO | Admitting: Family Medicine

## 2013-05-02 VITALS — BP 120/80 | HR 99 | Temp 99.0°F | Resp 17 | Ht 66.5 in | Wt 130.0 lb

## 2013-05-02 DIAGNOSIS — Z880 Allergy status to penicillin: Secondary | ICD-10-CM

## 2013-05-02 DIAGNOSIS — L239 Allergic contact dermatitis, unspecified cause: Secondary | ICD-10-CM

## 2013-05-02 DIAGNOSIS — L259 Unspecified contact dermatitis, unspecified cause: Secondary | ICD-10-CM

## 2013-05-02 MED ORDER — PREDNISONE 20 MG PO TABS: ORAL_TABLET | ORAL | Status: DC

## 2013-05-02 NOTE — Patient Instructions (Signed)
Take prednisone in a tapered dose fashion as directed. 3 daily for 2 days, then daily for 2 days, then one daily for 2 days. Dissection in the morning.  Take over-the-counter or Zyrtec (citirizine) one daily for itching. Can take twice daily for a couple of days if necessary. Higher doses may make you drowsy.  Return if any significant worsening of the rash. Specifically if any wheezing or sores or rash in the mouth or major worsening of a rash on the skin

## 2013-05-02 NOTE — Progress Notes (Signed)
Subjective: 23 year old lady who was started on amoxicillin about 8 days ago for a strep throat. She was strep positive. She took amoxicillin and yesterday developed a itching rash. Some on her face and trunk and arms and thighs. No other major symptoms from it. She did take Benadryl yesterday. She has some acne which accounts for some of the rash on her face and chest, but the remainder is the new rash. It itches  Objective: TMs normal. Throat clear. No oral lesions. Pharyngitis seems to be resolved. Neck supple without significant nodes. Chest is clear to auscultation. Heart regular without murmurs. Her skin shows some acne on the face making it a little more difficult to assess. There are red blotchy rash areas on the face neck chest abdomen, back or and arms.  Assessment: Amoxicillin allergy(penicillin class allergy) Allergic dermatitis  Plan: Work excuse for today and tomorrow  Taper of prednisone Over-the-counter Zyrtec Return if

## 2013-07-28 ENCOUNTER — Ambulatory Visit (INDEPENDENT_AMBULATORY_CARE_PROVIDER_SITE_OTHER): Payer: BC Managed Care – PPO | Admitting: Family Medicine

## 2013-07-28 VITALS — BP 118/68 | HR 95 | Temp 98.5°F | Resp 16 | Ht 66.5 in | Wt 132.4 lb

## 2013-07-28 DIAGNOSIS — J029 Acute pharyngitis, unspecified: Secondary | ICD-10-CM

## 2013-07-28 LAB — POCT RAPID STREP A (OFFICE): Rapid Strep A Screen: NEGATIVE

## 2013-07-28 NOTE — Patient Instructions (Signed)
Ibuprofen 400-600 mg every 8 hours as needed for pain/fever Chloraseptic spray (over the counter) as directed for throat pain or warm salt water gargles Any over the counter anti-histamine such as zyrtec, claritin, allegra (generic fine) once a day for congestion.  Pharyngitis Pharyngitis is redness, pain, and swelling (inflammation) of your pharynx.  CAUSES  Pharyngitis is usually caused by infection. Most of the time, these infections are from viruses (viral) and are part of a cold. However, sometimes pharyngitis is caused by bacteria (bacterial). Pharyngitis can also be caused by allergies. Viral pharyngitis may be spread from person to person by coughing, sneezing, and personal items or utensils (cups, forks, spoons, toothbrushes). Bacterial pharyngitis may be spread from person to person by more intimate contact, such as kissing.  SIGNS AND SYMPTOMS  Symptoms of pharyngitis include:   Sore throat.   Tiredness (fatigue).   Low-grade fever.   Headache.  Joint pain and muscle aches.  Skin rashes.  Swollen lymph nodes.  Plaque-like film on throat or tonsils (often seen with bacterial pharyngitis). DIAGNOSIS  Your health care provider will ask you questions about your illness and your symptoms. Your medical history, along with a physical exam, is often all that is needed to diagnose pharyngitis. Sometimes, a rapid strep test is done. Other lab tests may also be done, depending on the suspected cause.  TREATMENT  Viral pharyngitis will usually get better in 3 4 days without the use of medicine. Bacterial pharyngitis is treated with medicines that kill germs (antibiotics).  HOME CARE INSTRUCTIONS   Drink enough water and fluids to keep your urine clear or pale yellow.   Only take over-the-counter or prescription medicines as directed by your health care provider:   If you are prescribed antibiotics, make sure you finish them even if you start to feel better.   Do not take  aspirin.   Get lots of rest.   Gargle with 8 oz of salt water ( tsp of salt per 1 qt of water) as often as every 1 2 hours to soothe your throat.   Throat lozenges (if you are not at risk for choking) or sprays may be used to soothe your throat. SEEK MEDICAL CARE IF:   You have large, tender lumps in your neck.  You have a rash.  You cough up green, yellow-brown, or bloody spit. SEEK IMMEDIATE MEDICAL CARE IF:   Your neck becomes stiff.  You drool or are unable to swallow liquids.  You vomit or are unable to keep medicines or liquids down.  You have severe pain that does not go away with the use of recommended medicines.  You have trouble breathing (not caused by a stuffy nose). MAKE SURE YOU:   Understand these instructions.  Will watch your condition.  Will get help right away if you are not doing well or get worse. Document Released: 03/20/2005 Document Revised: 01/08/2013 Document Reviewed: 11/25/2012 [facility] Patient Information 2014 [place] [place].

## 2013-07-28 NOTE — Progress Notes (Signed)
I have discussed this case with Ms. Gessner, NP and agree.  

## 2013-07-28 NOTE — Progress Notes (Signed)
   Subjective:    Patient ID: Cheryl Cohen, female    DOB: 05-May-1990, 23 y.o.   MRN: [id number]  HPI Patient presents today with 3 day history of sore throat. Body aches, fever to 101. No recent sick contacts. No OTC medications.  Patient currently sexually active. Using depo-provera for contraception. Is not currently working. Is planning on attending classes at [employer] in the fall. Smokes several cigarettes daily. Ambilify working well for her, no side effects.  Review of Systems +headache, +ear pain, +cough, +sneezing, no SOB, +nasal drainage, no nausea/vomiting.     Objective:   Physical Exam  Vitals reviewed. Constitutional: She appears well-developed and well-nourished.  HENT:  Head: Normocephalic and atraumatic.  Right Ear: Tympanic membrane, external ear and ear canal normal.  Left Ear: Tympanic membrane, external ear and ear canal normal.  Nose: Mucosal edema and rhinorrhea present. Right sinus exhibits no maxillary sinus tenderness and no frontal sinus tenderness. Left sinus exhibits no maxillary sinus tenderness and no frontal sinus tenderness.  Mouth/Throat: Uvula is midline and mucous membranes are normal. Oropharyngeal exudate, posterior oropharyngeal edema and posterior oropharyngeal erythema present. No tonsillar abscesses.  Eyes: Conjunctivae are normal. Right eye exhibits no discharge. Left eye exhibits no discharge.  Neck: Normal range of motion. Neck supple.  Cardiovascular: Normal rate, regular rhythm and normal heart sounds.   Pulmonary/Chest: Effort normal and breath sounds normal.  Lymphadenopathy:    She has no cervical adenopathy.  Skin: Skin is warm and dry.  Psychiatric: She has a normal mood and affect. Her behavior is normal. Judgment and thought content normal.   Results for orders placed in visit on 07/28/13  POCT RAPID STREP A (OFFICE)      Result Value Ref Range   Rapid Strep A Screen Negative  Negative   Patient given 600 mg ibuprofen while in  clinic with some resolution of throat pain.    Assessment & Plan:  1. Acute pharyngitis - POCT rapid strep A- negative  - Culture, Group A Strep  -Follow up if no improvement in 5-7 days, increased pain, difficulty swallowing, fever greater than 101 for 2 days.  -Written and verbal instructions given for symptomatic treatment, including: Ibuprofen 400-600 mg every 8 hours as needed for pain/fever Chloraseptic spray (over the counter) as directed for throat pain or warm salt water gargles Any over the counter anti-histamine such as zyrtec, claritin, allegra (generic fine) once a day for congestion.   Emi Cheryl B. Gessner, FNP-BC  [facility]  07/28/2013 9:51 AM

## 2013-07-30 LAB — CULTURE, GROUP A STREP: ORGANISM ID, BACTERIA: NORMAL

## 2013-10-06 ENCOUNTER — Emergency Department (HOSPITAL_COMMUNITY)
Admission: EM | Admit: 2013-10-06 | Discharge: 2013-10-06 | Disposition: A | Discharge status: Home / Self Care | Payer: Medicaid Other | Attending: Emergency Medicine | Admitting: Emergency Medicine

## 2013-10-06 ENCOUNTER — Encounter (HOSPITAL_COMMUNITY): Payer: Self-pay | Admitting: Emergency Medicine

## 2013-10-06 DIAGNOSIS — F17200 Nicotine dependence, unspecified, uncomplicated: Secondary | ICD-10-CM | POA: Insufficient documentation

## 2013-10-06 DIAGNOSIS — Z3202 Encounter for pregnancy test, result negative: Secondary | ICD-10-CM | POA: Insufficient documentation

## 2013-10-06 DIAGNOSIS — Z8619 Personal history of other infectious and parasitic diseases: Secondary | ICD-10-CM | POA: Insufficient documentation

## 2013-10-06 DIAGNOSIS — Z880 Allergy status to penicillin: Secondary | ICD-10-CM | POA: Insufficient documentation

## 2013-10-06 DIAGNOSIS — F3130 Bipolar disorder, current episode depressed, mild or moderate severity, unspecified: Secondary | ICD-10-CM | POA: Insufficient documentation

## 2013-10-06 DIAGNOSIS — F411 Generalized anxiety disorder: Secondary | ICD-10-CM | POA: Insufficient documentation

## 2013-10-06 DIAGNOSIS — F32A Depression, unspecified: Secondary | ICD-10-CM

## 2013-10-06 DIAGNOSIS — Z79899 Other long term (current) drug therapy: Secondary | ICD-10-CM | POA: Insufficient documentation

## 2013-10-06 DIAGNOSIS — R45851 Suicidal ideations: Secondary | ICD-10-CM | POA: Insufficient documentation

## 2013-10-06 DIAGNOSIS — F329 Major depressive disorder, single episode, unspecified: Secondary | ICD-10-CM

## 2013-10-06 DIAGNOSIS — F3289 Other specified depressive episodes: Secondary | ICD-10-CM

## 2013-10-06 HISTORY — DX: Suicidal ideations: R45.851

## 2013-10-06 LAB — COMPREHENSIVE METABOLIC PANEL
ALBUMIN: 4.5 g/dL (ref 3.5–5.2)
ALT: 16 U/L (ref 0–35)
ANION GAP: 14 (ref 5–15)
AST: 19 U/L (ref 0–37)
Alkaline Phosphatase: 57 U/L (ref 39–117)
BILIRUBIN TOTAL: 0.5 mg/dL (ref 0.3–1.2)
BUN: 13 mg/dL (ref 6–23)
CALCIUM: 10.1 mg/dL (ref 8.4–10.5)
CO2: 25 mEq/L (ref 19–32)
CREATININE: 0.83 mg/dL (ref 0.50–1.10)
Chloride: 100 mEq/L (ref 96–112)
GFR calc Af Amer: >90 mL/min (ref >90)
GFR calc non Af Amer: >90 mL/min (ref >90)
Glucose, Bld: 92 mg/dL (ref 70–99)
Potassium: 3.8 mEq/L (ref 3.7–5.3)
Sodium: 139 mEq/L (ref 137–147)
TOTAL PROTEIN: 8 g/dL (ref 6.0–8.3)

## 2013-10-06 LAB — ETHANOL: Alcohol, Ethyl (B): <11 mg/dL (ref 0–11)

## 2013-10-06 LAB — CBC
HCT: 40.4 % (ref 36.0–46.0)
Hemoglobin: 14.1 g/dL (ref 12.0–15.0)
MCH: 30.9 pg (ref 26.0–34.0)
MCHC: 34.9 g/dL (ref 30.0–36.0)
MCV: 88.4 fL (ref 78.0–100.0)
PLATELETS: 225 10*3/uL (ref 150–400)
RBC: 4.57 MIL/uL (ref 3.87–5.11)
RDW: 12.5 % (ref 11.5–15.5)
WBC: 6.9 10*3/uL (ref 4.0–10.5)

## 2013-10-06 LAB — RAPID URINE DRUG SCREEN, HOSP PERFORMED
Amphetamines: NOT DETECTED
BARBITURATES: NOT DETECTED
Benzodiazepines: NOT DETECTED
COCAINE: NOT DETECTED
Opiates: NOT DETECTED
Tetrahydrocannabinol: NOT DETECTED

## 2013-10-06 LAB — SALICYLATE LEVEL

## 2013-10-06 LAB — ACETAMINOPHEN LEVEL: Acetaminophen (Tylenol), Serum: <15 ug/mL (ref 10–30)

## 2013-10-06 LAB — PREGNANCY, URINE: Preg Test, Ur: NEGATIVE

## 2013-10-06 MED ORDER — LORAZEPAM 1 MG PO TABS: 1.0000 mg | ORAL_TABLET | Freq: Three times a day (TID) | ORAL | Status: DC | PRN

## 2013-10-06 MED ORDER — IBUPROFEN 200 MG PO TABS: 600.0000 mg | ORAL_TABLET | Freq: Three times a day (TID) | ORAL | Status: DC | PRN

## 2013-10-06 MED ORDER — ACETAMINOPHEN 325 MG PO TABS: 650.0000 mg | ORAL_TABLET | ORAL | Status: DC | PRN

## 2013-10-06 MED ORDER — ONDANSETRON HCL 4 MG PO TABS: 4.0000 mg | ORAL_TABLET | Freq: Three times a day (TID) | ORAL | Status: DC | PRN

## 2013-10-06 NOTE — Progress Notes (Signed)
CSW provided patient with information on outpatient providers. Patient thanked CSW for assistance, has no further questions at this time. She will call father to pick her up at discharge.   Samuella BruinKristin Rufino Staup, MSW, [facility] Clinical Social Worker [facility] [phone]

## 2013-10-06 NOTE — Progress Notes (Signed)
[facility] did not get to see patient but will be sending information about [facility] to help patient establish primary care, using the address provided.  °

## 2013-10-06 NOTE — ED Notes (Signed)
Pt presents with father. Recent encounter with x boyfriend started event. Pt denies HI. Pt admits to SI thoughts with no plan. Pt admits to hitting herself and having moments of "mania".

## 2013-10-06 NOTE — BH Assessment (Signed)
Assessment complete. Consulted with Alberteen SamFran Hobson, NP who recommended Pt be evaluated by psychiatry later this morning. Discussed recommendation with Lottie Musselatyana A Kirichenko, PA-C who agreed with plan.  Harlin RainFord Ellis Ria CommentWarrick Jr, LPC, [facility] Triage Specialist [phone]

## 2013-10-06 NOTE — BH Assessment (Signed)
Tele Assessment Note   Cheryl Cohen is an 23 y.o. female, single, African-American who presents to [facility] ED reporting symptoms of depression and suicidal ideation. Pt reports she has a history of bipolar disorder and told her father tonight she was thinking of killing herself by cutting her wrists. Father has gone home. Pt reports she has been having suicidal thoughts since yesterday when her boyfriend broke up with her. Pt says she was thinking about cutting her wrists. Pt states she has cut herself in the past to relieve emotional stress but denies any history of suicide attempts. She says prior to yesterday's breakup she was somewhat depressed and rated her mood at that time as 5/10. She reports symptoms including tearfulness, irritability and feelings of worthlessness, guilt and hopelessness. She denies any homicidal ideation or history of violence. She reports she has a history auditory hallucination, including hearing voices, and "I have seen things on the television that are not right." Pt denies current psychotic symptoms. Pt denies problems with sleep or appetite. Pt reports she will drink 2-3 beers occasionally but denies alcohol abuse. She reports she was using marijuana occasionally but has stopped using because she is trying to find a better job.  Pt identifies her primary stressor as breaking up with her boyfriend. She reports she lives with both parents and a younger brother. She says he father is an alcoholic and she worries because he stays sober for a time and then relapses. She says her parents have a history of getting into verbal altercations due to her father's drinking. Pt reports she feels her parents are supportive but she would like to be more financially independent so she can have her own car and place to live.   Pt reports she was receiving outpatient [facility] with [facility] until 2-3 months ago. She currently has no outpatient providers. She is prescribed Abilify which  she reporting taking most of the time as prescribed. Pt denies any history of inpatient treatment but this is not accurate as Epic indicates she has been hospitalized at least three times for symptoms of psychosis.   Pt is dressed in a hospital gown, alert, oriented x4 with normal speech and normal motor behavior. Eye contact is good. Pt's mood is depressed and affect is congruent with mood. Thought process is coherent and relevant. There is no indication Pt is currently responding to internal stimuli or experiencing delusional thought content.  Pt is dressed in a hospital gown, alert, oriented x4 with soft speech and normal motor behavior. Eye contact is fair. Pt's mood is depressed and sad and affect is congruent with mood. Thought process is coherent and relevant. There is no indication Pt is currently responding to internal stimuli or experiencing delusional thought content. Pt was cooperative during assessment. Pt stated at the beginning of the assessment she was feeling suicidal but at the end she said she no longer wanted to hurt herself. Some of Pt's responses were inaccurate or inconsistent with other reports.   Axis I: 296.53 Bipolar I disorder, Current episode depressed, Severe Axis II: Deferred Axis III:  Past Medical History  Diagnosis Date  . Depression   . Anxiety   . Bipolar 1 disorder     hospitalized  3 x onset rx in 9th grade   . Syphilis   . Gonorrhea   . Hx of varicella    Axis IV: economic problems and other psychosocial or environmental problems Axis V: GAF=40  Past Medical History:  Past  Medical History  Diagnosis Date  . Depression   . Anxiety   . Bipolar 1 disorder     hospitalized  3 x onset rx in 9th grade   . Syphilis   . Gonorrhea   . Hx of varicella     Past Surgical History  Procedure Laterality Date  . Tympanostomy tube placement      asage 2-3 yrs    Family History:  Family History  Problem Relation Age of Onset  . Diabetes Mother   .  Arthritis Mother   . Hypertension Father   . Alcohol abuse Father     Social History:  reports that she has been smoking Cigarettes.  She has been smoking about 0.20 packs per day. She does not have any smokeless tobacco history on file. She reports that she does not drink alcohol or use illicit drugs.  Additional Social History:  Alcohol / Drug Use Pain Medications: Denies abuse Prescriptions: Denies abuse Over the Counter: Denies abuse History of alcohol / drug use?: Yes (Pt reports history of marijuana use.) Longest period of sobriety (when/how long): Unknown  CIWA: CIWA-Ar BP: 110/67 mmHg Pulse Rate: 64 COWS:    Allergies:  Allergies  Allergen Reactions  . Amoxicillin Rash    Home Medications:  (Not in a hospital admission)  OB/GYN Status:  No LMP recorded.  General Assessment Data Location of Assessment: [facility] ED Is this a Tele or Face-to-Face Assessment?: Tele Assessment Is this an Initial Assessment or a Re-assessment for this encounter?: Initial Assessment Living Arrangements: Parent (Both parents and younger brother) Can pt return to current living arrangement?: Yes Admission Status: Voluntary Is patient capable of signing voluntary admission?: Yes Transfer from: Home Referral Source: Self/Family/Friend     [facility] Living Arrangements: Parent (Both parents and younger brother) Name of Psychiatrist: None Name of Therapist: None  Education Status Is patient currently in school?: No Current Grade: NA Highest grade of school patient has completed: High school Name of school: NA Contact person: NA  Risk to self Suicidal Ideation: Yes-Currently Present Suicidal Intent: No Is patient at risk for suicide?: Yes Suicidal Plan?: Yes-Currently Present Specify Current Suicidal Plan: Plan to cut wrists Access to Means: Yes Specify Access to Suicidal Means: Access to sharps at home What has been your use of drugs/alcohol within the last 12 months?:  Pt reports occasional alcohol and marijuana use Previous Attempts/Gestures: No How many times?: 0 Other Self Harm Risks: Pt has history of cutting Triggers for Past Attempts: Family contact Intentional Self Injurious Behavior: Cutting Comment - Self Injurious Behavior: Pt reports she has a history of cutting Family Suicide History: No Recent stressful life event(s): Loss (Comment);Conflict (Comment) (Broke up with boyfriend) Persecutory voices/beliefs?: No Depression: Yes Depression Symptoms: Despondent;Tearfulness;Guilt;Loss of interest in usual pleasures;Feeling worthless/self pity;Feeling angry/irritable Substance abuse history and/or treatment for substance abuse?: No Suicide prevention information given to non-admitted patients: Not applicable  Risk to Others Homicidal Ideation: No Thoughts of Harm to Others: No Current Homicidal Intent: No Current Homicidal Plan: No Access to Homicidal Means: No Identified Victim: None History of harm to others?: No Assessment of Violence: None Noted Violent Behavior Description: Pt denies history of violence Does patient have access to weapons?: Yes (Comment) (Guns in the home) Criminal Charges Pending?: No Does patient have a court date: No  Psychosis Hallucinations: None noted (Pt has history of hearing voices) Delusions: None noted  Mental Status Report Appear/Hygiene: In hospital gown Eye Contact: Good Motor Activity: Unremarkable  Speech: Logical/coherent Level of Consciousness: Alert Mood: Depressed;Sad Affect: Depressed;Sad Anxiety Level: None Thought Processes: Coherent;Relevant Judgement: Unimpaired Orientation: Person;Place;Time;Situation;Appropriate for developmental age Obsessive Compulsive Thoughts/Behaviors: None  Cognitive Functioning Concentration: Normal Memory: Remote Intact;Recent Intact IQ: Average Insight: Fair Impulse Control: Fair Appetite: Good Weight Loss: 0 Weight Gain: 0 Sleep: No Change Total  Hours of Sleep: 8 Vegetative Symptoms: None  ADLScreening [facility] Assessment Services) Patient's cognitive ability adequate to safely complete daily activities?: Yes Patient able to express need for assistance with ADLs?: Yes Independently performs ADLs?: Yes (appropriate for developmental age)  Prior Inpatient Therapy Prior Inpatient Therapy: Yes Prior Therapy Dates: 2011, 2010, 2008 Prior Therapy Facilty/Provider(s): [facility], [facility] Reason for Treatment: Psychosis  Prior Outpatient Therapy Prior Outpatient Therapy: Yes Prior Therapy Dates: 2014-2015  Prior Therapy Facilty/Provider(s): [facility] Reason for Treatment: Bipolar disorder  ADL Screening (condition at time of admission) Patient's cognitive ability adequate to safely complete daily activities?: Yes Is the patient deaf or have difficulty hearing?: No Does the patient have difficulty seeing, even when wearing glasses/contacts?: No Does the patient have difficulty concentrating, remembering, or making decisions?: No Patient able to express need for assistance with ADLs?: Yes Does the patient have difficulty dressing or bathing?: No Independently performs ADLs?: Yes (appropriate for developmental age) Does the patient have difficulty walking or climbing stairs?: No Weakness of Legs: None Weakness of Arms/Hands: None  Home Assistive Devices/Equipment Home Assistive Devices/Equipment: None    Abuse/Neglect Assessment (Assessment to be complete while patient is alone) Physical Abuse: Denies Verbal Abuse: Denies Sexual Abuse: Denies Exploitation of patient/patient's resources: Denies Self-Neglect: Denies Values / Beliefs Cultural Requests During Hospitalization: None Spiritual Requests During Hospitalization: None   Advance Directives (For Healthcare) Advance Directive: Patient does not have advance directive;Patient would not like information Pre-existing out of facility DNR order (yellow form or pink MOST  form): No Nutrition Screen- [facility] Patient's home diet: Regular  Additional Information 1:1 In Past 12 Months?: No CIRT Risk: No Elopement Risk: No Does patient have medical clearance?: Yes     Disposition: Consulted with Alberteen Sam, NP who recommended Pt be evaluated by psychiatry later this morning. Discussed recommendation with Lottie Mussel, PA-C who agreed with plan.  Disposition Initial Assessment Completed for this Encounter: Yes Disposition of Patient: Other dispositions Other disposition(s): Other (Comment) (Pt to be evaluated by psychiatry later this morning)  Pamalee Leyden, [facility], [facility] Triage Specialist [phone]   Pamalee Leyden 10/06/2013 4:35 AM

## 2013-10-06 NOTE — Discharge Instructions (Signed)
Depression, Adult Depression is feeling sad, low, down in the dumps, blue, gloomy, or empty. In general, there are two kinds of depression:  Normal sadness or grief. This can happen after something upsetting. It often goes away on its own within 2 weeks. After losing a loved one (bereavement), normal sadness and grief may last longer than two weeks. It usually gets better with time.  Clinical depression. This kind lasts longer than normal sadness or grief. It keeps you from doing the things you normally do in life. It is often hard to function at home, work, or at school. It may affect your relationships with others. Treatment is often needed. GET HELP RIGHT AWAY IF:  You have thoughts about hurting yourself or others.  You lose touch with reality (psychotic symptoms). You may:  See or hear things that are not real.  Have untrue beliefs about your life or people around you.  Your medicine is giving you problems. MAKE SURE YOU:  Understand these instructions.  Will watch your condition.  Will get help right away if you are not doing well or get worse. Document Released: 04/22/2010 Document Revised: 12/13/2011 Document Reviewed: 07/20/2011 [facility] Patient Information 2015 [place], [place]. This information is not intended to replace advice given to you by your health care provider. Make sure you discuss any questions you have with your health care provider.  Suicidal Feelings, How to Help Yourself Everyone feels sad or unhappy at times, but depressing thoughts and feelings of hopelessness can lead to thoughts of suicide. It can seem as if life is too tough to handle. If you feel as though you have reached the point where suicide is the only answer, it is time to let someone know immediately.  HOW TO COPE AND PREVENT SUICIDE  Let family, friends, teachers, or counselors know. Get help. Try not to isolate yourself from those who care about you. Even though you may not feel sociable, talk  with someone every day. It is best if it is face-to-face. Remember, they will want to help you.  Eat a regularly spaced and well-balanced diet.  Get plenty of rest.  Avoid alcohol and drugs because they will only make you feel worse and may also lower your inhibitions. Remove them from the home. If you are thinking of taking an overdose of your prescribed medicines, give your medicines to someone who can give them to you one day at a time. If you are on antidepressants, let your caregiver know of your feelings so he or she can provide a safer medicine, if that is a concern.  Remove weapons or poisons from your home.  Try to stick to routines. Follow a schedule and remind yourself that you have to keep that schedule every day.  Set some realistic goals and achieve them. Make a list and cross things off as you go. Accomplishments give a sense of worth. Wait until you are feeling better before doing things you find difficult or unpleasant to do.  If you are able, try to start exercising. Even half-hour periods of exercise each day will make you feel better. Getting out in the sun or into nature helps you recover from depression faster. If you have a favorite place to walk, take advantage of that.  Increase safe activities that have always given you pleasure. This may include playing your favorite music, reading a good book, painting a picture, or playing your favorite instrument. Do whatever takes your mind off your depression.  Keep your living  space well-lighted. GET HELP Contact a suicide hotline, crisis center, or local suicide prevention center for help right away. Local centers may include a hospital, clinic, community service organization, social service provider, or health department.  Call your local emergency services (911 in the [place]).  Call a suicide hotline:  1-800-273-TALK (231-150-0543) in the [place].  1-800-SUICIDE 772-496-2392) in the [place].  216-311-3742 in the [place] for Spanish-speaking counselors.  6-168-372-9MSX (248)069-0830) in the [place] for TTY users.  Visit the following websites for information and help:  National Suicide Prevention Lifeline: www.suicidepreventionlifeline.org  Hopeline: www.hopeline.com  [employer] for Suicide Prevention: https://www.ayers.com/  For lesbian, gay, bisexual, transgender, or questioning youth, contact [employer]:  3-612-2-E-SLPNPY 573 140 7050) in the [place].  www.thetrevorproject.org  In [place], treatment resources are listed in each province with listings available under [employer] for [employer] or similar titles. Another source for Crisis Centres by [place] is located at http://www.suicideprevention.ca/in-crisis-now/find-a-crisis-centre-now/crisis-centres Document Released: 09/24/2002 Document Revised: 06/12/2011 Document Reviewed: 02/12/2007 [facility] Patient Information 2015 [place], [place]. This information is not intended to replace advice given to you by your health care provider. Make sure you discuss any questions you have with your health care provider.  Stress and Stress Management Stress is a normal reaction to life events. It is what you feel when life demands more than you are used to or more than you can handle. Some stress can be useful. For example, the stress reaction can help you catch the last bus of the day, study for a test, or meet a deadline at work. But stress that occurs too often or for too long can cause problems. It can affect your emotional health and interfere with relationships and normal daily activities. Too much stress can weaken your immune system and increase your risk for physical illness. If you already have a medical problem, stress can make it worse. CAUSES  All sorts of life events may cause stress. An event that causes stress for one person may not be stressful for another person. Major life  events commonly cause stress. These may be positive or negative. Examples include losing your job, moving into a new home, getting married, having a baby, or losing a loved one. Less obvious life events may also cause stress, especially if they occur day after day or in combination. Examples include working long hours, driving in traffic, caring for children, being in debt, or being in a difficult relationship. SIGNS AND SYMPTOMS Stress may cause emotional symptoms including, the following:  Anxiety--This is feeling worried, afraid, on edge, overwhelmed, or out of control.  Anger--This is feeling irritated or impatient.  Depression--This is feeling sad, down, helpless, or guilty.  Difficulty focusing, remembering, or making decisions. Stress may cause physical symptoms, including the following:   Aches and pains--These may affect your head, neck, back, stomach, or other areas of your body.  Tight muscles or clenched jaw.  Low energy or trouble sleeping. Stress may cause unhealthy behaviors, including the following:   Eating to feel better (overeating) or skipping meals.  Sleeping too little, too much, or both.  Working too much or putting off tasks (procrastination).  Smoking, drinking alcohol, or using drugs to feel better. DIAGNOSIS  Stress is diagnosed through an assessment by your health care provider. Your health care provider will ask questions about your symptoms and any stressful life events.Your health care provider will also ask about your medical history and may order blood tests or other tests. Certain medical  conditions and medicine can cause physical symptoms similar to stress. Mental illness can cause emotional symptoms and unhealthy behaviors similar to stress. Your health care provider may refer you to a mental health professional for further evaluation.  TREATMENT  Stress management is the recommended treatment for stress.The goals of stress management are reducing  stressful life events and coping with stress in healthy ways.  Techniques for reducing stressful life events include the following:  Stress identification--Self-monitor for stress and identify what causes stress for you. These skills may help you to avoid some stressful events.  Time management--Set your priorities, keep a calendar of events, and learn to say "no." These tools can help you avoid making too many commitments. Techniques for coping with stress include the following:  Rethinking the problem--Try to think realistically about stressful events rather than ignoring them or overreacting. Try to find the positives in a stressful situation rather than focusing on the negatives.  Exercise--Physical exercise can release both physical and emotional tension. The key is to find a form of exercise you enjoy and do it regularly.  Relaxation techniques. These relax the body and mind. Examples include yoga, meditation, tai chi, biofeedback, deep breathing, progressive muscle relaxation, listening to music, being out in nature, journaling, and other hobbies. Again, the key is to find one or more that you enjoy and can do regularly.  Healthy lifestyle--Eat a balanced diet, get plenty of sleep, and do not smoke. Avoid using alcohol or drugs to relax.  Strong support network--Spend time with family, friends, or other people you enjoy being around.Express your feelings and talk things over with someone you trust. Counseling or talktherapy with a mental health professional may be helpful if you are having difficulty managing stress on your own. Medicine is typically not recommended for the treatment of stress.Talk to your health care provider if you think you need medicine for symptoms of stress. HOME CARE INSTRUCTIONS:  Keep all follow up appointments with your health care provider.  Only take any prescribed medicines as directed by your health care provider.  Talk to your health care provider  before starting any new prescription or over-the-counter medicines. SEEK MEDICAL CARE IF:  Your symptoms get worse or you start having new symptoms.  You feel overwhelmed by your problems and can no longer manage them on your own. SEEK IMMEDIATE MEDICAL CARE IF:  You feel like hurting yourself or someone else. Document Released: 09/13/2000 Document Revised: 03/25/2013 Document Reviewed: 11/12/2012 [facility] Patient Information 2015 [place], [place]. This information is not intended to replace advice given to you by your health care provider. Make sure you discuss any questions you have with your health care provider.  Helping Someone Who Is Suicidal Take threats of suicide seriously. Listen to a suicidal person's thoughts and concerns with compassion. The fact that the person is talking to you is an important sign that he or she trusts you. Reasons for suicide can depend on where we are in life.   The younger person is often depressed over lost love.  The middle-aged person is often depressed over financial problems.  The elderly person is often depressed over health problems. SIGNS IN FAMILY OR FRIENDS WHO ARE SUICIDAL INCLUDE:  Depression which suddenly gets better. Getting over depression is usually a gradual process. A sudden change may mean the person has suddenly thought of suicide as a "solution."  A sudden loss of interest in family and friends and social withdrawal.  Loss of personal hygiene habits and not caring  for himself or herself.  Decline in handling of school, work, or other activities.  Injuries which are self-inflicted, such as burning or cutting.  Expressions of helplessness, hopelessness, and a sense of the loss of ability to handle life.  Risk-taking behavior, such as casual sex and drug use. COMMON SUICIDE RISKS INCLUDE:  Death or terminal illness of a relative or friend.  Broken relationships.  Loss of health.  Financial losses.  Chemical abuse  (drugs and alcohol).  Previous suicide attempts. If you do not feel adequate to listen or help, ask the person if you can help him or her get help. Ask if you can share the person's concerns with someone else such as a Medical illustrator. Just talking with someone else is helpful and you can be that help by listening. Some helpful tips are:  Listen to the person's thoughts and concerns. Let the person unburden his or her troubles on you.  Let the person know you will not let him or her be alone with the pain.  Ask the person if he or she is having thoughts of hurting himself or herself.  Ask what you can do to help lessen the pain.  Suggest that the person seek professional help and that you will assist him or her in finding help. Let the person know you will continue to be available to help. GET HELP  Contact a suicide hotline, crisis center, or local suicide prevention center for help right away. Local centers may include a hospital, clinic, community service organization, social service provider, or health department.  Call your local emergency services (911 in the [place]).  Call a suicide hotline:  1-800-273-TALK (1-808-100-1911) in the [place].  1-800-SUICIDE 808-610-6635) in the [place].  (814)158-2650 in the [place] for Spanish-speaking counselors.  9-290-903-0BOF (213) 263-3589) in the [place] for TTY users.  Visit the following websites for information and help:  National Suicide Prevention Lifeline: www.suicidepreventionlifeline.org  Hopeline: www.hopeline.[employer] for Suicide Prevention: PromotionalLoans.co.za  For lesbian, gay, bisexual, transgender, or questioning youth, contact [employer]:  9-144-4-P-EAKLTY 402-312-4694) in the [place].  www.thetrevorproject.org  In [place], treatment resources are listed in each [place] with listings available under [employer] for [employer] or similar  titles. Another source for Crisis Centres by [place] is located at http://www.suicideprevention.ca/in-crisis-now/find-a-crisis-centre-now/crisis-centres Document Released: 09/24/2002 Document Revised: 06/12/2011 Document Reviewed: 11/26/2007 [facility] Patient Information 2015 [place], [place]. This information is not intended to replace advice given to you by your health care provider. Make sure you discuss any questions you have with your health care provider.

## 2013-10-06 NOTE — Consult Note (Signed)
[facility] Consult   Reason for Consult:  Suicidal ideation, depression Referring Physician:  EDP  Cheryl Cohen is an 23 y.o. female. Total Time spent with patient: 45 minutes  Assessment: AXIS I:  Depressive Disorder NOS AXIS II:  Deferred AXIS III:   Past Medical History  Diagnosis Date  . Depression   . Anxiety   . Bipolar 1 disorder     hospitalized  3 x onset rx in 9th grade   . Syphilis   . Gonorrhea   . Hx of varicella    AXIS IV:  other psychosocial or environmental problems AXIS V:  61-70 mild symptoms  Plan:  No evidence of imminent risk to self or others at present.   Supportive therapy provided about ongoing stressors. Discussed crisis plan, support from social network, calling 911, coming to the Emergency Department, and calling Suicide Hotline.  Subjective:   Cheryl Cohen is a 23 y.o. female patient.  HPI:  Patient states "Cheryl Cohen I was having thoughts of hurting myself."  Patient states that she and her ex-boyfriend had a conversation that caused her to have suicidal thoughts. "I talked to my ex-boyfriend yesterday and he said he didn't want nothing to do with me anymore.  We've still been friends; I just feel like he has been manipulating me this whole time.  But this all started over a text message where I was asking for something that belong to me."  Patient states that she is no longer having suicidal thoughts "I have to much going on for me to kill myself over something like that.  I live home with my mom, dad, and brother."  Patient states that she was going to [facility] for outpatient services and wants to return for therapy and medication management.   Patient take Abilify but hasn't been taking it daily.   Patient denies suicidal/homicidal ideation, psychosis, and paranoia.   HPI Elements:   Location:  Depression. Quality:  Suicidal thoughts. Severity:  Suicidal thoughts. Timing:  1 day. Review of Systems  Constitutional: Negative  for malaise/fatigue and diaphoresis.  Gastrointestinal: Negative for vomiting, abdominal pain, diarrhea and constipation.  Musculoskeletal: Negative.   Neurological: Negative for tremors, seizures, loss of consciousness and headaches.  Psychiatric/Behavioral: Positive for depression. Negative for suicidal ideas, hallucinations and substance abuse. The patient is not nervous/anxious and does not have insomnia.     Past Psychiatric History: Past Medical History  Diagnosis Date  . Depression   . Anxiety   . Bipolar 1 disorder     hospitalized  3 x onset rx in 9th grade   . Syphilis   . Gonorrhea   . Hx of varicella     reports that she has been smoking Cigarettes.  She has been smoking about 0.20 packs per day. She does not have any smokeless tobacco history on file. She reports that she does not drink alcohol or use illicit drugs. Family History  Problem Relation Age of Onset  . Diabetes Mother   . Arthritis Mother   . Hypertension Father   . Alcohol abuse Father    Family History Substance Abuse: Yes, Describe: (Father has history of alcoholism) Family Supports: Yes, List: (Parents) Living Arrangements: Parent (Both parents and younger brother) Can pt return to current living arrangement?: Yes Abuse/Neglect [facility]) Physical Abuse: Denies Verbal Abuse: Denies Sexual Abuse: Denies Allergies:   Allergies  Allergen Reactions  . Amoxicillin Rash    ACT Assessment Complete:  Yes:  Educational Status    Risk to Self: Risk to self Suicidal Ideation: Yes-Currently Present Suicidal Intent: No Is patient at risk for suicide?: Yes Suicidal Plan?: Yes-Currently Present Specify Current Suicidal Plan: Plan to cut wrists Access to Means: Yes Specify Access to Suicidal Means: Access to sharps at home What has been your use of drugs/alcohol within the last 12 months?: Pt reports occasional alcohol and marijuana use Previous Attempts/Gestures: No How many times?: 0 Other Self Harm  Risks: Pt has history of cutting Triggers for Past Attempts: Family contact Intentional Self Injurious Behavior: Cutting Comment - Self Injurious Behavior: Pt reports she has a history of cutting Family Suicide History: No Recent stressful life event(s): Loss (Comment);Conflict (Comment) (Broke up with boyfriend) Persecutory voices/beliefs?: No Depression: Yes Depression Symptoms: Despondent;Tearfulness;Guilt;Loss of interest in usual pleasures;Feeling worthless/self pity;Feeling angry/irritable Substance abuse history and/or treatment for substance abuse?: No Suicide prevention information given to non-admitted patients: Not applicable  Risk to Others: Risk to Others Homicidal Ideation: No Thoughts of Harm to Others: No Current Homicidal Intent: No Current Homicidal Plan: No Access to Homicidal Means: No Identified Victim: None History of harm to others?: No Assessment of Violence: None Noted Violent Behavior Description: Pt denies history of violence Does patient have access to weapons?: Yes (Comment) (Guns in the home) Criminal Charges Pending?: No Does patient have a court date: No  Abuse: Abuse/Neglect Assessment (Assessment to be complete while patient is alone) Physical Abuse: Denies Verbal Abuse: Denies Sexual Abuse: Denies Exploitation of patient/patient's resources: Denies Self-Neglect: Denies  Prior Inpatient Therapy: Prior Inpatient Therapy Prior Inpatient Therapy: Yes Prior Therapy Dates: 2011, 2010, 2008 Prior Therapy Facilty/Provider(s): [facility], [place] Reason for Treatment: Psychosis  Prior Outpatient Therapy: Prior Outpatient Therapy Prior Outpatient Therapy: Yes Prior Therapy Dates: 2014-2015  Prior Therapy Facilty/Provider(s): [facility] Reason for Treatment: Bipolar disorder  Additional Information: Additional Information 1:1 In Past 12 Months?: No CIRT Risk: No Elopement Risk: No Does patient have medical clearance?: Yes   Objective: Blood  pressure 97/53, pulse 78, temperature 98.3 F (36.8 C), temperature source Oral, resp. rate 18, SpO2 100.00%.There is no weight on file to calculate BMI. Results for orders placed during the hospital encounter of 10/06/13 (from the past 72 hour(s))  URINE RAPID DRUG SCREEN (HOSP PERFORMED)     Status: None   Collection Time    10/06/13  1:00 AM      Result Value Ref Range   Opiates NONE DETECTED  NONE DETECTED   Cocaine NONE DETECTED  NONE DETECTED   Benzodiazepines NONE DETECTED  NONE DETECTED   Amphetamines NONE DETECTED  NONE DETECTED   Tetrahydrocannabinol NONE DETECTED  NONE DETECTED   Barbiturates NONE DETECTED  NONE DETECTED   Comment:            DRUG SCREEN FOR MEDICAL PURPOSES     ONLY.  IF CONFIRMATION IS NEEDED     FOR ANY PURPOSE, NOTIFY LAB     WITHIN 5 DAYS.                LOWEST DETECTABLE LIMITS     FOR URINE DRUG SCREEN     Drug Class       Cutoff (ng/mL)     Amphetamine      1000     Barbiturate      200     Benzodiazepine   292     Tricyclics       446     Opiates  300     Cocaine          300     THC              50  PREGNANCY, URINE     Status: None   Collection Time    10/06/13  1:48 AM      Result Value Ref Range   Preg Test, Ur NEGATIVE  NEGATIVE   Comment:            THE SENSITIVITY OF THIS     METHODOLOGY IS >20 mIU/mL.  ACETAMINOPHEN LEVEL     Status: None   Collection Time    10/06/13  1:57 AM      Result Value Ref Range   Acetaminophen (Tylenol), Serum <15.0  10 - 30 ug/mL   Comment:            THERAPEUTIC CONCENTRATIONS VARY     SIGNIFICANTLY. A RANGE OF 10-30     ug/mL MAY BE AN EFFECTIVE     CONCENTRATION FOR MANY PATIENTS.     HOWEVER, SOME ARE BEST TREATED     AT CONCENTRATIONS OUTSIDE THIS     RANGE.     ACETAMINOPHEN CONCENTRATIONS     >150 ug/mL AT 4 HOURS AFTER     INGESTION AND >50 ug/mL AT 12     HOURS AFTER INGESTION ARE     OFTEN ASSOCIATED WITH TOXIC     REACTIONS.  CBC     Status: None   Collection Time     10/06/13  1:57 AM      Result Value Ref Range   WBC 6.9  4.0 - 10.5 K/uL   RBC 4.57  3.87 - 5.11 MIL/uL   Hemoglobin 14.1  12.0 - 15.0 g/dL   HCT 40.4  36.0 - 46.0 %   MCV 88.4  78.0 - 100.0 fL   MCH 30.9  26.0 - 34.0 pg   MCHC 34.9  30.0 - 36.0 g/dL   RDW 12.5  11.5 - 15.5 %   Platelets 225  150 - 400 K/uL  COMPREHENSIVE METABOLIC PANEL     Status: None   Collection Time    10/06/13  1:57 AM      Result Value Ref Range   Sodium 139  137 - 147 mEq/L   Potassium 3.8  3.7 - 5.3 mEq/L   Chloride 100  96 - 112 mEq/L   CO2 25  19 - 32 mEq/L   Glucose, Bld 92  70 - 99 mg/dL   BUN 13  6 - 23 mg/dL   Creatinine, Ser 0.83  0.50 - 1.10 mg/dL   Calcium 10.1  8.4 - 10.5 mg/dL   Total Protein 8.0  6.0 - 8.3 g/dL   Albumin 4.5  3.5 - 5.2 g/dL   AST 19  0 - 37 U/L   ALT 16  0 - 35 U/L   Alkaline Phosphatase 57  39 - 117 U/L   Total Bilirubin 0.5  0.3 - 1.2 mg/dL   GFR calc non Af Amer >90  >90 mL/min   GFR calc Af Amer >90  >90 mL/min   Comment: (NOTE)     The eGFR has been calculated using the CKD EPI equation.     This calculation has not been validated in all clinical situations.     eGFR's persistently <90 mL/min signify possible Chronic Kidney     Disease.   Anion gap 14  5 - 15  ETHANOL     Status: None   Collection Time    10/06/13  1:57 AM      Result Value Ref Range   Alcohol, Ethyl (B) <11  0 - 11 mg/dL   Comment:            LOWEST DETECTABLE LIMIT FOR     SERUM ALCOHOL IS 11 mg/dL     FOR MEDICAL PURPOSES ONLY  SALICYLATE LEVEL     Status: Abnormal   Collection Time    10/06/13  1:57 AM      Result Value Ref Range   Salicylate Lvl <0.1 (*) 2.8 - 20.0 mg/dL   Labs are reviewed see above values.  Medications reviewed and no changes made  Current Facility-Administered Medications  Medication Dose Route Frequency Provider Last Rate Last Dose  . acetaminophen (TYLENOL) tablet 650 mg  650 mg Oral Q4H PRN Tatyana A Kirichenko, PA-C      . ibuprofen (ADVIL,MOTRIN) tablet  600 mg  600 mg Oral Q8H PRN Tatyana A Kirichenko, PA-C      . LORazepam (ATIVAN) tablet 1 mg  1 mg Oral Q8H PRN Tatyana A Kirichenko, PA-C      . ondansetron (ZOFRAN) tablet 4 mg  4 mg Oral Q8H PRN Tatyana A Kirichenko, PA-C       Current Outpatient Prescriptions  Medication Sig Dispense Refill  . ARIPiprazole (ABILIFY) 5 MG tablet Take 5 mg by mouth every evening.      . medroxyPROGESTERone (DEPO-PROVERA) 150 MG/ML injection Inject 150 mg into the muscle every 3 (three) months.        Psychiatric Specialty Exam:     Blood pressure 97/53, pulse 78, temperature 98.3 F (36.8 C), temperature source Oral, resp. rate 18, SpO2 100.00%.There is no weight on file to calculate BMI.  General Appearance: Fairly Groomed  Engineer, water::  Good  Speech:  Clear and Coherent and Normal Rate  Volume:  Normal  Mood:  Depressed  Affect:  Congruent and Depressed  Thought Process:  Circumstantial, Coherent, Goal Directed and Logical  Orientation:  Full (Time, Place, and Person)  Thought Content:  "I want to get back on my medication and start therapy"  Suicidal Thoughts:  No, Denies at this time  Homicidal Thoughts:  No  Memory:  Immediate;   Good Recent;   Good Remote;   Good  Judgement:  Intact  Insight:  Present  Psychomotor Activity:  Normal  Concentration:  Fair  Recall:  Good  Fund of Knowledge:Good  Language: Good  Akathisia:  No  Handed:  Right  AIMS (if indicated):     Assets:  Communication Skills Desire for Improvement Housing Resilience Social Support  Sleep:      Musculoskeletal: Strength & Muscle Tone: within normal limits Gait & Station: normal Patient leans: N/A  Treatment Plan Summary:  Patient unable to follow up with [facility] related to outstanding bill. Consulted with SW/TTS and will set patient up with an appointment for outpatient services (therapy/medication management). Discharge home with Referral to outpatient services for medication management and therapy.       Rankin, Shuvon, FNP-BC 10/06/2013 2:07 PM  I have personally seen the patient and agreed with the findings and involved in the treatment plan. Berniece Andreas, MD

## 2013-10-06 NOTE — ED Notes (Signed)
Bed: WA17 Expected date:  Expected time:  Means of arrival:  Comments: RM 12

## 2013-10-06 NOTE — [facility] Suicide Risk Assessment (Cosign Needed)
Suicide Risk Assessment  Discharge Assessment     Demographic Factors:  Black, female  Total Time spent with patient: 45 minutes Psychiatric Specialty Exam:      Blood pressure 97/53, pulse 78, temperature 98.3 F (36.8 C), temperature source Oral, resp. rate 18, SpO2 100.00%.There is no weight on file to calculate BMI.   General Appearance: Fairly Groomed   Patent attorneyye Contact:: Good   Speech: Clear and Coherent and Normal Rate   Volume: Normal   Mood: Depressed   Affect: Congruent and Depressed   Thought Process: Circumstantial, Coherent, Goal Directed and Logical   Orientation: Full (Time, Place, and Person)   Thought Content: "I want to get back on my medication and start therapy"   Suicidal Thoughts: No, Denies at this time   Homicidal Thoughts: No   Memory: Immediate; Good  Recent; Good  Remote; Good   Judgement: Intact   Insight: Present   Psychomotor Activity: Normal   Concentration: Fair   Recall: Good   Fund of Knowledge:Good   Language: Good   Akathisia: No   Handed: Right   AIMS (if indicated):   Assets: Communication Skills  Desire for Improvement  Housing  Resilience  Social Support   Sleep:   Musculoskeletal:  Strength & Muscle Tone: within normal limits  Gait & Station: normal  Patient leans: N/A    Mental Status Per Nursing Assessment::   On Admission:     Current Mental Status by Physician: Patient denies suicidal/homicidal ideation, psychosis, and paranoia  Loss Factors: NA  Historical Factors: NA  Risk Reduction Factors:   Sense of responsibility to family, Religious beliefs about death, Living with another person, especially a relative and Positive social support  Continued Clinical Symptoms:  Depression  Cognitive Features That Contribute To Risk:  None noted    Suicide Risk:  Minimal: No identifiable suicidal ideation.  Patients presenting with no risk factors but with morbid ruminations; may be classified as minimal risk based on  the severity of the depressive symptoms  Discharge Diagnoses:  AXIS I: Depressive Disorder NOS  AXIS II: Deferred  AXIS III:  Past Medical History   Diagnosis  Date   .  Depression    .  Anxiety    .  Bipolar 1 disorder      hospitalized 3 x onset rx in 9th grade   .  Syphilis    .  Gonorrhea    .  Hx of varicella     AXIS IV: other psychosocial or environmental problems  AXIS V: 61-70 mild symptoms   Plan Of Care/Follow-up recommendations:  Activity:  Resume usual activity Diet:  Resume usual diet  Is patient on multiple antipsychotic therapies at discharge:  No   Has Patient had three or more failed trials of antipsychotic monotherapy by history:  No  Recommended Plan for Multiple Antipsychotic Therapies: NA    Rankin, Shuvon, FNP-BC 10/06/2013, 2:39 PM

## 2013-10-06 NOTE — ED Notes (Signed)
Bed: [id number] Expected date:  Expected time:  Means of arrival:  Comments: tr4

## 2013-10-06 NOTE — BH Assessment (Signed)
Received call for assessment. Spoke to Electronic Data Systemsatyana A Kirichenko, PA-C who said Pt has history of depression and is having suicidal thoughts with plan to cut her wrists. Tele-assessment will be initiated.  Harlin RainFord Ellis Ria CommentWarrick Jr, LPC, [facility] Triage Specialist [phone]

## 2013-10-06 NOTE — ED Notes (Addendum)
Pt has shirt, pants, bra and shoes.   Pt has been seen and wanded by security.

## 2013-10-06 NOTE — ED Notes (Signed)
TTS in progress 
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
## 2013-10-07 NOTE — ED Provider Notes (Signed)
Medical screening examination/treatment/procedure(s) were performed by non-physician practitioner and as supervising physician I was immediately available for consultation/collaboration.  Sani Loiseau T Theran Vandergrift, MD 10/07/13 1009 

## 2014-08-18 ENCOUNTER — Ambulatory Visit (INDEPENDENT_AMBULATORY_CARE_PROVIDER_SITE_OTHER): Payer: 59 | Admitting: Internal Medicine

## 2014-08-18 ENCOUNTER — Encounter: Payer: Self-pay | Admitting: Internal Medicine

## 2014-08-18 VITALS — BP 100/60 | HR 95 | Temp 98.5°F | Wt 130.0 lb

## 2014-08-18 DIAGNOSIS — N912 Amenorrhea, unspecified: Secondary | ICD-10-CM | POA: Diagnosis not present

## 2014-08-18 DIAGNOSIS — Z3009 Encounter for other general counseling and advice on contraception: Secondary | ICD-10-CM | POA: Diagnosis not present

## 2014-08-18 DIAGNOSIS — F319 Bipolar disorder, unspecified: Secondary | ICD-10-CM | POA: Diagnosis not present

## 2014-08-18 LAB — POCT URINE PREGNANCY: Preg Test, Ur: NEGATIVE

## 2014-08-18 NOTE — Progress Notes (Signed)
Pre visit review using our clinic review tool, if applicable. No additional management support is needed unless otherwise documented below in the visit note. 

## 2014-08-18 NOTE — Patient Instructions (Addendum)
Will do a referral . To Dr. Madaline GuthrieSteiner  .  Urine preg test is negative today  Takes   While to get periods back to  normal but still risk of pregnancy .  Use some method in the meantime  No tobacco and  etoh Take folic acid  Pre pregnancy  Planning  To prevent birth defects  Many women do great with  Mirena. iud . Very reliable and no systemic side effects .,  Get appt with your GYNE to discuss this.  Disc   Also with dr Madaline GuthrieSteiner

## 2014-08-18 NOTE — Progress Notes (Signed)
Chief Complaint  Patient presents with  . Referral to psychiatry    HPI: Cheryl Cohen 24 y.o. last seen by me over 2.5 years ago  Comes in for psychiatry help referral .  It appears that her  [facility] the past  1-2 years .   Was in ed July 2015 for depression   had psychiatry . Under caer dr Madaline GuthrieSteiner since 6th grade .  New insurance requires a referral .  Ask about periods : had been on ndepo via gyne   Off   Depo  Light  Since April 18  Due no other method  Has gyne Lives At home .  1 partner  For over 6 months. 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 etoh ocass .Tobacco  black a melt . Marland Kitchen.  ROS: See pertinent positives and negatives per HPI. No cp sob some inc appetite   Past Medical History  Diagnosis Date  . Depression   . Anxiety   . Bipolar 1 disorder     hospitalized  3 x onset rx in 9th grade   . Syphilis   . Gonorrhea   . Hx of varicella     Family History  Problem Relation Age of Onset  . Diabetes Mother   . Arthritis Mother   . Hypertension Father   . Alcohol abuse Father     History   Social History  . Marital Status: Single    Spouse Name: N/A  . Number of Children: N/A  . Years of Education: N/A   Social History Main Topics  . Smoking status: Current Some Day Smoker -- 0.20 packs/day    Types: Cigarettes  . Smokeless tobacco: Not on file     Comment: 1 cigarette a day  . Alcohol Use: No  . Drug Use: No  . Sexual Activity: Not on file   Other Topics Concern  . 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 ARIPiprazole (ABILIFY) 5 MG tablet Take 5 mg by mouth every evening.    . medroxyPROGESTERone (DEPO-PROVERA) 150 MG/ML injection Inject 150 mg  into the muscle every 3 (three) months.     No facility-administered medications prior to visit.     EXAM:  BP 100/60 mmHg  Pulse 95  Temp(Src) 98.5 F (36.9 C) (Oral)  Wt 130 lb (58.968 kg)  Body mass index is 20.67 kg/(m^2).  GENERAL: vitals reviewed and listed above, alert, oriented, appears well hydrated and in no acute distress HEENT: atraumatic, conjunctiva  clear, no obvious abnormalities on inspection of external nose and ears  MS: moves all extremities without noticeable focal  abnormality PSYCH: pleasant and cooperative, no obvious depression or anxiety Skin acne face  Looks well nl thought and speech.   ASSESSMENT AND PLAN:  Discussed the following assessment and plan:  Bipolar 1 disorder - Plan: Ambulatory referral to Psychiatry  Amenorrhea - just off of depo neg ucg - Plan: POCT urine pregnancy  Encounter for other general counseling or advice on contraception dsic improatncde of sometime of contraceptino  in theinterim  . Risk benefit  Until choice is more clear issues of medication in preg  BH issues etc .  Please atlk withpsych about this.  -Patient advised to return or notify health care team  if symptoms worsen ,persist or new concerns arise. Total visit 25mins > 50% spent counseling and coordinating care as indicated in above note and in instructions to patient .   Patient Instructions  Will do a referral . To Dr. Madaline GuthrieSteiner  .  Urine preg test is negative today  Takes   While to get periods back to  normal but still risk of pregnancy .  Use some method in the meantime  No tobacco and  etoh Take folic acid  Pre pregnancy  Planning  To prevent birth defects  Many women do great with  Mirena. iud . Very reliable and no systemic side effects .,  Get appt with your GYNE to discuss this.  Disc   Also with dr Coral CeoSteiner      Wanda K. Panosh M.D.

## 2015-01-05 ENCOUNTER — Emergency Department (HOSPITAL_COMMUNITY)
Admission: EM | Admit: 2015-01-05 | Discharge: 2015-01-06 | Disposition: A | Discharge status: Home / Self Care | Payer: 59 | Attending: Emergency Medicine | Admitting: Emergency Medicine

## 2015-01-05 ENCOUNTER — Encounter (HOSPITAL_COMMUNITY): Payer: Self-pay | Admitting: Emergency Medicine

## 2015-01-05 DIAGNOSIS — Z008 Encounter for other general examination: Secondary | ICD-10-CM | POA: Diagnosis present

## 2015-01-05 DIAGNOSIS — Z3202 Encounter for pregnancy test, result negative: Secondary | ICD-10-CM | POA: Diagnosis not present

## 2015-01-05 DIAGNOSIS — Z880 Allergy status to penicillin: Secondary | ICD-10-CM | POA: Insufficient documentation

## 2015-01-05 DIAGNOSIS — Z720 Tobacco use: Secondary | ICD-10-CM | POA: Insufficient documentation

## 2015-01-05 DIAGNOSIS — F319 Bipolar disorder, unspecified: Secondary | ICD-10-CM | POA: Diagnosis not present

## 2015-01-05 DIAGNOSIS — Z8619 Personal history of other infectious and parasitic diseases: Secondary | ICD-10-CM | POA: Diagnosis not present

## 2015-01-05 LAB — COMPREHENSIVE METABOLIC PANEL
ALBUMIN: 4.4 g/dL (ref 3.5–5.0)
ALT: 17 U/L (ref 14–54)
ANION GAP: 5 (ref 5–15)
AST: 25 U/L (ref 15–41)
Alkaline Phosphatase: 59 U/L (ref 38–126)
BUN: 11 mg/dL (ref 6–20)
CHLORIDE: 105 mmol/L (ref 101–111)
CO2: 27 mmol/L (ref 22–32)
Calcium: 9.3 mg/dL (ref 8.9–10.3)
Creatinine, Ser: 0.73 mg/dL (ref 0.44–1.00)
GFR calc non Af Amer: >60 mL/min (ref >60)
Glucose, Bld: 79 mg/dL (ref 65–99)
Potassium: 3.6 mmol/L (ref 3.5–5.1)
SODIUM: 137 mmol/L (ref 135–145)
Total Bilirubin: 1 mg/dL (ref 0.3–1.2)
Total Protein: 7.7 g/dL (ref 6.5–8.1)

## 2015-01-05 LAB — CBC WITH DIFFERENTIAL/PLATELET
Basophils Absolute: 0 10*3/uL (ref 0.0–0.1)
Basophils Relative: 0 %
Eosinophils Absolute: 0.2 10*3/uL (ref 0.0–0.7)
Eosinophils Relative: 3 %
HEMATOCRIT: 42.2 % (ref 36.0–46.0)
HEMOGLOBIN: 14.3 g/dL (ref 12.0–15.0)
LYMPHS ABS: 2 10*3/uL (ref 0.7–4.0)
LYMPHS PCT: 33 %
MCH: 30.9 pg (ref 26.0–34.0)
MCHC: 33.9 g/dL (ref 30.0–36.0)
MCV: 91.1 fL (ref 78.0–100.0)
MONOS PCT: 13 %
Monocytes Absolute: 0.8 10*3/uL (ref 0.1–1.0)
NEUTROS ABS: 3 10*3/uL (ref 1.7–7.7)
NEUTROS PCT: 51 %
Platelets: 286 10*3/uL (ref 150–400)
RBC: 4.63 MIL/uL (ref 3.87–5.11)
RDW: 12.5 % (ref 11.5–15.5)
WBC: 6.1 10*3/uL (ref 4.0–10.5)

## 2015-01-05 LAB — RAPID URINE DRUG SCREEN, HOSP PERFORMED
Amphetamines: NOT DETECTED
BARBITURATES: NOT DETECTED
Benzodiazepines: NOT DETECTED
COCAINE: NOT DETECTED
Opiates: NOT DETECTED
TETRAHYDROCANNABINOL: NOT DETECTED

## 2015-01-05 LAB — ETHANOL

## 2015-01-05 LAB — HCG, QUANTITATIVE, PREGNANCY: hCG, Beta Chain, Quant, S: <1 m[IU]/mL (ref <5)

## 2015-01-05 MED ORDER — ACETAMINOPHEN 325 MG PO TABS: 650.0000 mg | ORAL_TABLET | ORAL | Status: DC | PRN

## 2015-01-05 MED ORDER — LORAZEPAM 1 MG PO TABS: 1.0000 mg | ORAL_TABLET | Freq: Three times a day (TID) | ORAL | Status: DC | PRN

## 2015-01-05 MED ORDER — IBUPROFEN 200 MG PO TABS: 600.0000 mg | ORAL_TABLET | Freq: Three times a day (TID) | ORAL | Status: DC | PRN

## 2015-01-05 MED ORDER — ONDANSETRON HCL 4 MG PO TABS: 4.0000 mg | ORAL_TABLET | Freq: Three times a day (TID) | ORAL | Status: DC | PRN

## 2015-01-05 NOTE — BH Assessment (Signed)
Per Julieanne Cotton, NP - patient meets criteria for inpatient hospitalization.  Writer informed the [facility]).

## 2015-01-05 NOTE — BHH Counselor (Signed)
01/05/15 Referral packet sent to [place], [place], [place], [place], [place], [place], [place], [address], [place], [place], [place], [place], [place], [place], [place], And [place]. Krislynn Gronau K. Vieva Brummitt,NCC, LPC-A, LCAS-A  Counselor 01/05/2015 10:34 PM

## 2015-01-05 NOTE — ED Notes (Signed)
Bed: WBH39 Expected date:  Expected time:  Means of arrival:  Comments: Hold for triage 4 

## 2015-01-05 NOTE — ED Notes (Addendum)
Per pt/mother-states she has'nt been acting herself-patient states she thinks she might be pregnant, having a lot of pregnancy symptoms-has been tested and both urine and blood were negative-she insists she is pregnant-mother thinks she is delusional-patient states she has been of BCP since April and has been having unprotected sex with a man from  [place]-mother states she has not taken meds in awhile-states she is currently manic and PCP is worried about her mentally health

## 2015-01-05 NOTE — ED Notes (Signed)
Patient denies SI, HI and AVH at this time. Patient reports that she believes she is pregnant because she has all the signs and symptoms. Patient states " My my mom thinks I'm out of touch with reality but I'm not". Plan of care discussed with patient. Patient voices no complaints or concerns at this time. Encouragement and support provided and safety maintain. Q 15 min safety checks remain in place.

## 2015-01-05 NOTE — ED Provider Notes (Signed)
CSN: [id number]     Arrival date & time 01/05/15  1435 History   First MD Initiated Contact with Patient 01/05/15 1508     Chief Complaint  Patient presents with  . Medical Clearance     (Consider location/radiation/quality/duration/timing/severity/associated sxs/prior Treatment) The history is provided by the patient.   patient presents for medical clearance. Brought in by her mother. Patient believes she is pregnant. She has had negative pregnancy tests at home and at the gynecologist. States she has to be pregnant because she has been nauseous she's been eating more and her nipples have become darker. She has a history of bipolar disorder and has been off of her Abilify. States that if she were pregnant it would've been from September. States was around the time she stopped taking her medicine also. Denies substance abuse. Discussed with patient's mother. Patient has been violent in the past. Patient will likely not want to get treatment. Patient denies suicidal or homicidal thoughts. Denies hallucinations. She does not have good insight to her disease at this time.  Past Medical History  Diagnosis Date  . Depression   . Anxiety   . Bipolar 1 disorder (HCC)     hospitalized  3 x onset rx in 9th grade   . Syphilis   . Gonorrhea   . Hx of varicella    Past Surgical History  Procedure Laterality Date  . Tympanostomy tube placement      asage 2-3 yrs   Family History  Problem Relation Age of Onset  . Diabetes Mother   . Arthritis Mother   . Hypertension Father   . Alcohol abuse Father    Social History  Substance Use Topics  . Smoking status: Current Some Day Smoker -- 0.20 packs/day    Types: Cigarettes  . Smokeless tobacco: None     Comment: 1 cigarette a day  . Alcohol Use: No   OB History    No data available     Review of Systems  Constitutional: Negative for activity change and appetite change.  Eyes: Negative for pain.  Respiratory: Negative for chest  tightness and shortness of breath.   Cardiovascular: Negative for chest pain and leg swelling.  Gastrointestinal: Negative for nausea, vomiting, abdominal pain and diarrhea.  Genitourinary: Negative for flank pain.  Musculoskeletal: Negative for back pain and neck stiffness.  Skin: Negative for rash.  Neurological: Negative for weakness, numbness and headaches.  Psychiatric/Behavioral: Negative for behavioral problems.      Allergies  Amoxicillin  Home Medications   Prior to Admission medications   Not on File   BP 107/61 mmHg  Pulse 81  Temp(Src) 97.7 F (36.5 C) (Oral)  Resp 18  SpO2 100% Physical Exam  Constitutional: She is oriented to person, place, and time. She appears well-developed and well-nourished.  HENT:  Head: Normocephalic and atraumatic.  Eyes: Pupils are equal, round, and reactive to light.  Neck: Normal range of motion. Neck supple.  Cardiovascular: Normal rate, regular rhythm and normal heart sounds.   No murmur heard. Pulmonary/Chest: Effort normal and breath sounds normal. No respiratory distress. She has no wheezes. She has no rales.  Abdominal: Soft. Bowel sounds are normal. She exhibits no distension.  Musculoskeletal: Normal range of motion.  Neurological: She is alert and oriented to person, place, and time. No cranial nerve deficit.  Skin: Skin is warm and dry.  Psychiatric: She has a normal mood and affect. Her speech is normal.  Nursing note and vitals reviewed.  ED Course  Procedures (including critical care time) Labs Review Labs Reviewed  COMPREHENSIVE METABOLIC PANEL  CBC WITH DIFFERENTIAL/PLATELET  URINE RAPID DRUG SCREEN, HOSP PERFORMED  HCG, QUANTITATIVE, PREGNANCY  ETHANOL    Imaging Review No results found. I have personally reviewed and evaluated these images and lab results as part of my medical decision-making.   EKG Interpretation None      MDM   Final diagnoses:  Bipolar 1 disorder [facility])    Patient with  the delusion that she is pregnant. His bipolar has been off her medications. Appears medically cleared at this time. To be seen by TTS.    Benjiman Core, MD 01/06/15 [phone]

## 2015-01-06 NOTE — ED Notes (Signed)
Pt. Pleasant and cooperative.  She denies HI or SI.  Pt. Expresses understanding of plan of care at this time.

## 2015-01-06 NOTE — BH Assessment (Signed)
01/06/15   Per Standley Brooking, pt accepted to [facility] by Dr. Betti Cruz for tx. Pt admitted to [facility], pls call report to [phone]. Pt awaiting sheriff transport.

## 2015-01-06 NOTE — ED Notes (Signed)
[employer] in the ED with IVC paperwork.

## 2015-01-06 NOTE — ED Notes (Signed)
Report given to Vernona Rieger, RN at [facility]

## 2015-01-20 ENCOUNTER — Encounter (HOSPITAL_COMMUNITY): Payer: Self-pay | Admitting: *Deleted

## 2015-01-20 ENCOUNTER — Inpatient Hospital Stay (HOSPITAL_COMMUNITY)
Admission: AD | Admit: 2015-01-20 | Discharge: 2015-01-20 | Disposition: A | Discharge status: Home / Self Care | Payer: 59 | Source: Ambulatory Visit | Attending: Obstetrics and Gynecology | Admitting: Obstetrics and Gynecology

## 2015-01-20 DIAGNOSIS — Z87891 Personal history of nicotine dependence: Secondary | ICD-10-CM | POA: Insufficient documentation

## 2015-01-20 DIAGNOSIS — R1030 Lower abdominal pain, unspecified: Secondary | ICD-10-CM | POA: Insufficient documentation

## 2015-01-20 DIAGNOSIS — N3000 Acute cystitis without hematuria: Secondary | ICD-10-CM

## 2015-01-20 DIAGNOSIS — N390 Urinary tract infection, site not specified: Secondary | ICD-10-CM | POA: Diagnosis not present

## 2015-01-20 DIAGNOSIS — M545 Low back pain: Secondary | ICD-10-CM | POA: Diagnosis not present

## 2015-01-20 LAB — URINALYSIS, ROUTINE W REFLEX MICROSCOPIC
Bilirubin Urine: NEGATIVE
GLUCOSE, UA: NEGATIVE mg/dL
Ketones, ur: 15 mg/dL — AB
Leukocytes, UA: NEGATIVE
Nitrite: POSITIVE — AB
PH: 5.5 (ref 5.0–8.0)
Protein, ur: NEGATIVE mg/dL
Urobilinogen, UA: 0.2 mg/dL (ref 0.0–1.0)

## 2015-01-20 LAB — POCT PREGNANCY, URINE: PREG TEST UR: NEGATIVE

## 2015-01-20 LAB — URINE MICROSCOPIC-ADD ON

## 2015-01-20 MED ORDER — SULFAMETHOXAZOLE-TRIMETHOPRIM 800-160 MG PO TABS: 1.0000 | ORAL_TABLET | Freq: Two times a day (BID) | ORAL | Status: DC

## 2015-01-20 NOTE — MAU Note (Signed)
Pt states she has lower abdominal pain and back pain since September, rates 6/10. Has not taken anything for it. Got off Depo in April. Eating a lot more and using bathroom more frequently. White discharge with no odor or itching.

## 2015-01-20 NOTE — Discharge Instructions (Signed)

## 2015-01-20 NOTE — MAU Provider Note (Signed)
History     CSN: [id number]  Arrival date and time: 01/20/15 [id number]   First Provider Initiated Contact with Patient 01/20/15 2115      Chief Complaint  Patient presents with  . Possible Pregnancy  . Abdominal Pain   HPI This is a 24 y.o. female who presents with c/o lower abdominal and low back pain for several weeks. Has not tried Tylenol or any other meds. Stopped DepoProvera in the spring. Is not on any other birth control.  Thinks she might be pregnant, increased appetite and urinary frequency. Has some white vaginal discharge but no odor or itching.   RN Note: Pt states she has lower abdominal pain and back pain since September, rates 6/10. Has not taken anything for it. Got off Depo in April. Eating a lot more and using bathroom more frequently. White discharge with no odor or itching  OB History    No data available      Past Medical History  Diagnosis Date  . Depression   . Anxiety   . Bipolar 1 disorder (HCC)     hospitalized  3 x onset rx in 9th grade   . Syphilis   . Gonorrhea   . Hx of varicella     Past Surgical History  Procedure Laterality Date  . Tympanostomy tube placement      asage 2-3 yrs    Family History  Problem Relation Age of Onset  . Diabetes Mother   . Arthritis Mother   . Hypertension Father   . Alcohol abuse Father     Social History  Substance Use Topics  . Smoking status: Former Smoker -- 0.20 packs/day    Types: Cigarettes  . Smokeless tobacco: None     Comment: 1 cigarette a day  . Alcohol Use: No    Allergies:  Allergies  Allergen Reactions  . Amoxicillin Rash        No prescriptions prior to admission    Review of Systems  Constitutional: Negative for fever, chills and malaise/fatigue.  Gastrointestinal: Positive for abdominal pain. Negative for nausea, vomiting, diarrhea and constipation.  Genitourinary: Positive for urgency and frequency. Negative for dysuria and flank pain.  Musculoskeletal: Positive for  back pain. Negative for myalgias.  Neurological: Negative for dizziness, weakness and headaches.   Physical Exam   Blood pressure 126/76, pulse 97, temperature 98 F (36.7 C), temperature source Oral, resp. rate 18, height 5\' 5"  (1.651 m), weight 132 lb 12.8 oz (60.238 kg), last menstrual period 12/03/2014, SpO2 100 %.  Physical Exam  Constitutional: She is oriented to person, place, and time. She appears well-developed and well-nourished. No distress.  HENT:  Head: Normocephalic.  Cardiovascular: Normal rate, regular rhythm and normal heart sounds.   Respiratory: Effort normal. No respiratory distress.  GI: Soft. She exhibits no distension and no mass. There is tenderness (over central suprapubic area). There is no rebound and no guarding.  Genitourinary: Vaginal discharge (thin white discharge, small amount, no whiff) found.  Musculoskeletal: Normal range of motion.  Neurological: She is alert and oriented to person, place, and time.  Skin: Skin is warm and dry. She is not diaphoretic.  Psychiatric: She has a normal mood and affect.    MAU Course  Procedures  MDM Pregnancy test and UA sent    Ref. Range 01/20/2015 20:09  Preg Test, Ur Latest Ref Range: NEGATIVE  NEGATIVE    Ref. Range 01/20/2015 20:12  Appearance Latest Ref Range: CLEAR  CLEAR  Bacteria, UA  Latest Ref Range: RARE  MANY (A)  Bilirubin Urine Latest Ref Range: NEGATIVE  NEGATIVE  Color, Urine Latest Ref Range: YELLOW  YELLOW  Glucose Latest Ref Range: NEGATIVE mg/dL NEGATIVE  Hgb urine dipstick Latest Ref Range: NEGATIVE  SMALL (A)  Ketones, ur Latest Ref Range: NEGATIVE mg/dL 15 (A)  Leukocytes, UA Latest Ref Range: NEGATIVE  NEGATIVE  Nitrite Latest Ref Range: NEGATIVE  POSITIVE (A)  pH Latest Ref Range: 5.0-8.0  5.5  Protein Latest Ref Range: NEGATIVE mg/dL NEGATIVE  RBC / HPF Latest Ref Range: <3 RBC/hpf 0-2  Specific Gravity, Urine Latest Ref Range: 1.005-1.030  >1.030 (H)  Squamous Epithelial / LPF  Latest Ref Range: RARE  FEW (A)  Urobilinogen, UA Latest Ref Range: 0.0-1.0 mg/dL 0.2  WBC, UA Latest Ref Range: <3 WBC/hpf 0-2    Assessment and Plan  A:  Urinary Frequency       Low abdominal and low back pain      Negative pregnancy test      UA is indicative for Urinary Tract Infection       P:  Discharge home      Reviewed test results       Rx Septra DS x 7 days for UTI       Followup in office for GYN care or as needed [facility] 01/20/2015, 9:24 PM

## 2015-04-04 NOTE — L&D Delivery Note (Signed)
  Cheryl Cohen, [name] [[id number]]  Delivery Note At 3:15 AM a non-viable female was delivered via Vaginal, Spontaneous Delivery (Presentation: breech).  APGAR: 0, 0; weight 10.4 oz (295 g).      Cheryl Cohen, [name] [[id number]]  Delivery Note At 3:33 AM a non-viable female was delivered via Vaginal, Spontaneous Delivery (Presentation: vertex ).  APGAR: 0, 0; weight 13.9 oz (394 g).    Anesthesia: None  Episiotomy: None Lacerations:  None Est. Blood Loss (mL):  100cc  Retained placenta, bleeding currently stable  To OR for D&C for retained placenta, procedure and risks discussed.  [name] 09/09/2015, 4:13 AM

## 2015-04-19 ENCOUNTER — Encounter (HOSPITAL_COMMUNITY): Payer: Self-pay | Admitting: *Deleted

## 2015-04-19 ENCOUNTER — Inpatient Hospital Stay (HOSPITAL_COMMUNITY)
Admission: AD | Admit: 2015-04-19 | Discharge: 2015-04-19 | Disposition: A | Discharge status: Home / Self Care | Payer: BLUE CROSS/BLUE SHIELD | Source: Ambulatory Visit | Attending: Family Medicine | Admitting: Family Medicine

## 2015-04-19 DIAGNOSIS — Z3201 Encounter for pregnancy test, result positive: Secondary | ICD-10-CM | POA: Diagnosis not present

## 2015-04-19 DIAGNOSIS — O26891 Other specified pregnancy related conditions, first trimester: Secondary | ICD-10-CM | POA: Diagnosis not present

## 2015-04-19 DIAGNOSIS — Z3A01 Less than 8 weeks gestation of pregnancy: Secondary | ICD-10-CM | POA: Insufficient documentation

## 2015-04-19 DIAGNOSIS — Z3490 Encounter for supervision of normal pregnancy, unspecified, unspecified trimester: Secondary | ICD-10-CM

## 2015-04-19 LAB — POCT PREGNANCY, URINE: PREG TEST UR: POSITIVE — AB

## 2015-04-19 NOTE — MAU Provider Note (Signed)
S: in to get conformation of pregnancy, denies complaints. O; VSS, LMP 03/13/15 with EDC of 12/18/15 A: early preg 5.2 wks P: seek PNC ASAP, Start PN vits

## 2015-04-19 NOTE — MAU Note (Signed)
Pt presents to MAU for confirmation of pregnancy. Denies any pain or vaginal bleeding.

## 2015-07-13 DIAGNOSIS — O30032 Twin pregnancy, monochorionic/diamniotic, second trimester: Secondary | ICD-10-CM | POA: Diagnosis not present

## 2015-07-13 DIAGNOSIS — Z3A17 17 weeks gestation of pregnancy: Secondary | ICD-10-CM | POA: Diagnosis not present

## 2015-07-13 DIAGNOSIS — R8290 Unspecified abnormal findings in urine: Secondary | ICD-10-CM | POA: Diagnosis not present

## 2015-07-22 DIAGNOSIS — Z36 Encounter for antenatal screening of mother: Secondary | ICD-10-CM | POA: Diagnosis not present

## 2015-07-22 DIAGNOSIS — Z3A18 18 weeks gestation of pregnancy: Secondary | ICD-10-CM | POA: Diagnosis not present

## 2015-07-22 DIAGNOSIS — O30032 Twin pregnancy, monochorionic/diamniotic, second trimester: Secondary | ICD-10-CM | POA: Diagnosis not present

## 2015-08-10 DIAGNOSIS — O365922 Maternal care for other known or suspected poor fetal growth, second trimester, fetus 2: Secondary | ICD-10-CM | POA: Diagnosis not present

## 2015-08-10 DIAGNOSIS — Z3A21 21 weeks gestation of pregnancy: Secondary | ICD-10-CM | POA: Diagnosis not present

## 2015-08-10 DIAGNOSIS — O30032 Twin pregnancy, monochorionic/diamniotic, second trimester: Secondary | ICD-10-CM | POA: Diagnosis not present

## 2015-09-03 DIAGNOSIS — F909 Attention-deficit hyperactivity disorder, unspecified type: Secondary | ICD-10-CM | POA: Diagnosis not present

## 2015-09-03 DIAGNOSIS — F319 Bipolar disorder, unspecified: Secondary | ICD-10-CM | POA: Diagnosis not present

## 2015-09-08 ENCOUNTER — Inpatient Hospital Stay (HOSPITAL_COMMUNITY)
Admission: AD | Admit: 2015-09-08 | Discharge: 2015-09-09 | DRG: 767 | Disposition: A | Discharge status: Home / Self Care | Payer: BLUE CROSS/BLUE SHIELD | Source: Ambulatory Visit | Attending: Obstetrics and Gynecology | Admitting: Obstetrics and Gynecology

## 2015-09-08 ENCOUNTER — Encounter (HOSPITAL_COMMUNITY): Payer: Self-pay | Admitting: *Deleted

## 2015-09-08 DIAGNOSIS — Z833 Family history of diabetes mellitus: Secondary | ICD-10-CM

## 2015-09-08 DIAGNOSIS — Z3A25 25 weeks gestation of pregnancy: Secondary | ICD-10-CM | POA: Diagnosis not present

## 2015-09-08 DIAGNOSIS — Z8249 Family history of ischemic heart disease and other diseases of the circulatory system: Secondary | ICD-10-CM

## 2015-09-08 DIAGNOSIS — Z87891 Personal history of nicotine dependence: Secondary | ICD-10-CM | POA: Diagnosis not present

## 2015-09-08 DIAGNOSIS — O364XX0 Maternal care for intrauterine death, not applicable or unspecified: Secondary | ICD-10-CM | POA: Diagnosis not present

## 2015-09-08 DIAGNOSIS — O30009 Twin pregnancy, unspecified number of placenta and unspecified number of amniotic sacs, unspecified trimester: Secondary | ICD-10-CM | POA: Diagnosis present

## 2015-09-08 DIAGNOSIS — O30032 Twin pregnancy, monochorionic/diamniotic, second trimester: Secondary | ICD-10-CM | POA: Diagnosis not present

## 2015-09-08 DIAGNOSIS — O364XX2 Maternal care for intrauterine death, fetus 2: Secondary | ICD-10-CM | POA: Diagnosis not present

## 2015-09-08 DIAGNOSIS — O364XX1 Maternal care for intrauterine death, fetus 1: Secondary | ICD-10-CM | POA: Diagnosis not present

## 2015-09-08 DIAGNOSIS — O321XX1 Maternal care for breech presentation, fetus 1: Secondary | ICD-10-CM | POA: Diagnosis not present

## 2015-09-08 HISTORY — DX: Twin pregnancy, unspecified number of placenta and unspecified number of amniotic sacs, unspecified trimester: O30.009

## 2015-09-08 LAB — OB RESULTS CONSOLE ABO/RH: RH TYPE: POSITIVE

## 2015-09-08 LAB — CBC
HCT: 36.9 % (ref 36.0–46.0)
Hemoglobin: 13.3 g/dL (ref 12.0–15.0)
MCH: 32.2 pg (ref 26.0–34.0)
MCHC: 36 g/dL (ref 30.0–36.0)
MCV: 89.3 fL (ref 78.0–100.0)
PLATELETS: 254 10*3/uL (ref 150–400)
RBC: 4.13 MIL/uL (ref 3.87–5.11)
RDW: 13.4 % (ref 11.5–15.5)
WBC: 13 10*3/uL — ABNORMAL HIGH (ref 4.0–10.5)

## 2015-09-08 LAB — OB RESULTS CONSOLE GC/CHLAMYDIA
Chlamydia: NEGATIVE
Gonorrhea: NEGATIVE

## 2015-09-08 LAB — OB RESULTS CONSOLE RPR: RPR: NONREACTIVE

## 2015-09-08 LAB — ABO/RH: ABO/RH(D): B POS

## 2015-09-08 LAB — TYPE AND SCREEN
ABO/RH(D): B POS
ANTIBODY SCREEN: NEGATIVE

## 2015-09-08 LAB — OB RESULTS CONSOLE HIV ANTIBODY (ROUTINE TESTING): HIV: NONREACTIVE

## 2015-09-08 LAB — OB RESULTS CONSOLE RUBELLA ANTIBODY, IGM: RUBELLA: IMMUNE

## 2015-09-08 LAB — OB RESULTS CONSOLE HEPATITIS B SURFACE ANTIGEN: HEP B S AG: NEGATIVE

## 2015-09-08 LAB — OB RESULTS CONSOLE ANTIBODY SCREEN: ANTIBODY SCREEN: NEGATIVE

## 2015-09-08 MED ORDER — OXYCODONE-ACETAMINOPHEN 5-325 MG PO TABS
1.0000 | ORAL_TABLET | ORAL | Status: DC | PRN
Start: 2015-09-08 — End: 2015-09-09

## 2015-09-08 MED ORDER — BUTORPHANOL TARTRATE 1 MG/ML IJ SOLN
1.0000 mg | INTRAMUSCULAR | Status: DC | PRN
  Administered 2015-09-08 – 2015-09-09 (×2): 1 mg via INTRAVENOUS
  Filled 2015-09-08 (×2): qty 1

## 2015-09-08 MED ORDER — OXYTOCIN 40 UNITS IN LACTATED RINGERS INFUSION - SIMPLE MED
2.5000 [IU]/h | INTRAVENOUS | Status: DC
  Administered 2015-09-09: 2 [IU]/h via INTRAVENOUS
  Filled 2015-09-08 (×2): qty 1000

## 2015-09-08 MED ORDER — OXYTOCIN BOLUS FROM INFUSION: 500.0000 mL | INTRAVENOUS | Status: DC

## 2015-09-08 MED ORDER — LACTATED RINGERS IV SOLN
INTRAVENOUS | Status: DC
  Administered 2015-09-08: 16:00:00 via INTRAVENOUS

## 2015-09-08 MED ORDER — ONDANSETRON HCL 4 MG/2ML IJ SOLN: 4.0000 mg | Freq: Four times a day (QID) | INTRAMUSCULAR | Status: DC | PRN

## 2015-09-08 MED ORDER — OXYCODONE-ACETAMINOPHEN 5-325 MG PO TABS
2.0000 | ORAL_TABLET | ORAL | Status: DC | PRN
Start: 2015-09-08 — End: 2015-09-09

## 2015-09-08 MED ORDER — MISOPROSTOL 200 MCG PO TABS
600.0000 ug | ORAL_TABLET | Freq: Four times a day (QID) | ORAL | Status: DC
  Administered 2015-09-08 (×2): 600 ug via VAGINAL
  Filled 2015-09-08 (×2): qty 3

## 2015-09-08 MED ORDER — LIDOCAINE HCL (PF) 1 % IJ SOLN
30.0000 mL | INTRAMUSCULAR | Status: DC | PRN
Start: 2015-09-08 — End: 2015-09-09
  Filled 2015-09-08: qty 30

## 2015-09-08 MED ORDER — SOD CITRATE-CITRIC ACID 500-334 MG/5ML PO SOLN
30.0000 mL | ORAL | Status: DC | PRN
  Administered 2015-09-09: 30 mL via ORAL
  Filled 2015-09-08: qty 15

## 2015-09-08 MED ORDER — LACTATED RINGERS IV SOLN: 500.0000 mL | INTRAVENOUS | Status: DC | PRN

## 2015-09-08 NOTE — Progress Notes (Signed)
Set up- Educated on use- will start at 2 hours after IV pain meds

## 2015-09-08 NOTE — H&P (Signed)
Cheryl Cohen is a 25 y.o. female, G1P0, EGA [id number] with EDC 9-16, monochorionic/diamniotic twins presenting for induction due to demise of both twins.  On her anatomy u/s on 4-20 there was already a 27%discordance, f/u u/s today with demise of both twins, Baby A measures 20 weeks, Baby B 21 weeks and has nuchal edema, pleural effusions and body wall edema, breech/transverse.  History OB History    Gravida Para Term Preterm AB TAB SAB Ectopic Multiple Living   1              Past Medical History  Diagnosis Date  . Depression   . Anxiety   . Bipolar 1 disorder (HCC)     hospitalized  3 x onset rx in 9th grade   . Syphilis   . Gonorrhea   . Hx of varicella    Past Surgical History  Procedure Laterality Date  . Tympanostomy tube placement      asage 2-3 yrs   Family History: family history includes Alcohol abuse in her father; Arthritis in her mother; Diabetes in her mother; Hypertension in her father. Social History:  reports that she has quit smoking. Her smoking use included Cigarettes. She smoked 0.20 packs per day. She does not have any smokeless tobacco history on file. She reports that she does not drink alcohol or use illicit drugs.   Review of Systems  Respiratory: Negative.   Cardiovascular: Negative.     Dilation:  (dimple) Effacement (%): Thick Exam by:: M.Merrill, RN Blood pressure 114/69, pulse 66, temperature 98.6 F (37 C), temperature source Oral, resp. rate 18, height 5\' 7"  (1.702 m), weight 70.308 kg (155 lb), last menstrual period 03/13/2015. Exam Physical Exam  Vitals reviewed. Constitutional: She appears well-developed and well-nourished.  Cardiovascular: Normal rate, regular rhythm and normal heart sounds.   No murmur heard. Respiratory: Effort normal and breath sounds normal. No respiratory distress. She has no wheezes.  GI: Soft.    Prenatal labs: ABO, Rh: --/--/B POS (06/07 1625) Antibody: PENDING (06/07 1625) Rubella: Immune (06/07 1554) RPR:  Nonreactive (06/07 1554)  HBsAg:   Neg HIV: Non-reactive (06/07 1554)   Assessment/Plan: Mono/di twin pregnancy at [redacted]W[redacted]D with demise of both twins.  Discussed in the office, could have been early TTTS.  She wants to start induction today.  She has been admitted and received first dose of cytotec 600 mg, will monitor progress.  She has been advised this may be a multi-day process and that she could require a D&C to remove placenta.     Sawyer Kahan D 09/08/2015, 5:13 PM

## 2015-09-08 NOTE — Progress Notes (Signed)
I offer initial grief support to Kearstin and her mother, Jimmy FootmanMeloney.  They found out earlier today at an ultrasound that babies Alise and Denny Peonvery had no heartbeats.  Baudelia reports feeling movement yesterday and is trying to reconcile that with being told at the ultrasound that it looked as if they may have been gone for some time.    Annalysse has left a message for FOB, but he has not yet returned her call.    She is aware of our ongoing spiritual care availability.    Please page as needs arise or as pt requests.  Chaplain Dyanne CarrelKaty Criss Pallone, Bcc Pager, [phone] 4:22 PM    09/08/15 1600  Clinical Encounter Type  Visited With Patient;Patient and family together  Visit Type Initial;Spiritual support  Referral From Nurse  Consult/Referral To Chaplain  Spiritual Encounters  Spiritual Needs Emotional;Grief support  Stress Factors  Patient Stress Factors Loss (Perinatal loss)

## 2015-09-09 ENCOUNTER — Inpatient Hospital Stay (HOSPITAL_COMMUNITY): Payer: BLUE CROSS/BLUE SHIELD | Admitting: Anesthesiology

## 2015-09-09 ENCOUNTER — Encounter (HOSPITAL_COMMUNITY)
Admission: AD | Disposition: A | Discharge status: Home / Self Care | Payer: Self-pay | Source: Ambulatory Visit | Attending: Obstetrics and Gynecology

## 2015-09-09 ENCOUNTER — Encounter (HOSPITAL_COMMUNITY): Payer: Self-pay | Admitting: *Deleted

## 2015-09-09 DIAGNOSIS — O30009 Twin pregnancy, unspecified number of placenta and unspecified number of amniotic sacs, unspecified trimester: Secondary | ICD-10-CM

## 2015-09-09 HISTORY — PX: DILATION AND EVACUATION: SHX1459

## 2015-09-09 HISTORY — DX: Twin pregnancy, unspecified number of placenta and unspecified number of amniotic sacs, unspecified trimester: O30.009

## 2015-09-09 LAB — RPR: RPR Ser Ql: NONREACTIVE

## 2015-09-09 SURGERY — DILATION AND EVACUATION, UTERUS
Anesthesia: Spinal

## 2015-09-09 MED ORDER — SIMETHICONE 80 MG PO CHEW: 80.0000 mg | CHEWABLE_TABLET | ORAL | Status: DC | PRN

## 2015-09-09 MED ORDER — IBUPROFEN 800 MG PO TABS: 800.0000 mg | ORAL_TABLET | Freq: Three times a day (TID) | ORAL | Status: DC | PRN

## 2015-09-09 MED ORDER — SENNOSIDES-DOCUSATE SODIUM 8.6-50 MG PO TABS: 2.0000 | ORAL_TABLET | ORAL | Status: DC

## 2015-09-09 MED ORDER — METHYLERGONOVINE MALEATE 0.2 MG/ML IJ SOLN: 0.2000 mg | INTRAMUSCULAR | Status: DC | PRN

## 2015-09-09 MED ORDER — ZOLPIDEM TARTRATE 5 MG PO TABS: 5.0000 mg | ORAL_TABLET | Freq: Every evening | ORAL | Status: DC | PRN

## 2015-09-09 MED ORDER — DEXAMETHASONE SODIUM PHOSPHATE 10 MG/ML IJ SOLN
INTRAMUSCULAR | Status: DC | PRN
  Administered 2015-09-09: 4 mg via INTRAVENOUS

## 2015-09-09 MED ORDER — DIBUCAINE 1 % RE OINT: 1.0000 "application " | TOPICAL_OINTMENT | RECTAL | Status: DC | PRN

## 2015-09-09 MED ORDER — ONDANSETRON HCL 4 MG/2ML IJ SOLN
INTRAMUSCULAR | Status: DC | PRN
  Administered 2015-09-09: 4 mg via INTRAVENOUS

## 2015-09-09 MED ORDER — MIDAZOLAM HCL 2 MG/2ML IJ SOLN
INTRAMUSCULAR | Status: AC
  Filled 2015-09-09: qty 2

## 2015-09-09 MED ORDER — OXYCODONE HCL 5 MG PO TABS: 10.0000 mg | ORAL_TABLET | ORAL | Status: DC | PRN

## 2015-09-09 MED ORDER — METHYLERGONOVINE MALEATE 0.2 MG PO TABS: 0.2000 mg | ORAL_TABLET | ORAL | Status: DC | PRN

## 2015-09-09 MED ORDER — LACTATED RINGERS IV SOLN: INTRAVENOUS | Status: DC

## 2015-09-09 MED ORDER — METOCLOPRAMIDE HCL 5 MG/ML IJ SOLN: 10.0000 mg | Freq: Once | INTRAMUSCULAR | Status: DC | PRN

## 2015-09-09 MED ORDER — MEPERIDINE HCL 25 MG/ML IJ SOLN: 6.2500 mg | INTRAMUSCULAR | Status: DC | PRN

## 2015-09-09 MED ORDER — PRENATAL MULTIVITAMIN CH
1.0000 | ORAL_TABLET | Freq: Every day | ORAL | Status: DC
  Administered 2015-09-09: 1 via ORAL
  Filled 2015-09-09: qty 1

## 2015-09-09 MED ORDER — SERTRALINE HCL 25 MG PO TABS
25.0000 mg | ORAL_TABLET | Freq: Every day | ORAL | Status: DC
  Filled 2015-09-09: qty 1

## 2015-09-09 MED ORDER — ONDANSETRON HCL 4 MG PO TABS: 4.0000 mg | ORAL_TABLET | ORAL | Status: DC | PRN

## 2015-09-09 MED ORDER — MAGNESIUM HYDROXIDE 400 MG/5ML PO SUSP: 30.0000 mL | ORAL | Status: DC | PRN

## 2015-09-09 MED ORDER — ONDANSETRON HCL 4 MG/2ML IJ SOLN: 4.0000 mg | INTRAMUSCULAR | Status: DC | PRN

## 2015-09-09 MED ORDER — FENTANYL CITRATE (PF) 100 MCG/2ML IJ SOLN: 25.0000 ug | INTRAMUSCULAR | Status: DC | PRN

## 2015-09-09 MED ORDER — PROPOFOL 500 MG/50ML IV EMUL
INTRAVENOUS | Status: DC | PRN
  Administered 2015-09-09 (×2): 20 mg via INTRAVENOUS

## 2015-09-09 MED ORDER — IBUPROFEN 600 MG PO TABS
600.0000 mg | ORAL_TABLET | Freq: Four times a day (QID) | ORAL | Status: DC
  Administered 2015-09-09 (×2): 600 mg via ORAL
  Filled 2015-09-09 (×2): qty 1

## 2015-09-09 MED ORDER — PROPOFOL 500 MG/50ML IV EMUL
INTRAVENOUS | Status: DC | PRN
  Administered 2015-09-09: 75 ug/kg/min via INTRAVENOUS

## 2015-09-09 MED ORDER — DIPHENHYDRAMINE HCL 25 MG PO CAPS: 25.0000 mg | ORAL_CAPSULE | Freq: Four times a day (QID) | ORAL | Status: DC | PRN

## 2015-09-09 MED ORDER — LIDOCAINE HCL (CARDIAC) 20 MG/ML IV SOLN
INTRAVENOUS | Status: AC
  Filled 2015-09-09: qty 5

## 2015-09-09 MED ORDER — ACETAMINOPHEN 325 MG PO TABS: 650.0000 mg | ORAL_TABLET | ORAL | Status: DC | PRN

## 2015-09-09 MED ORDER — FENTANYL CITRATE (PF) 100 MCG/2ML IJ SOLN
INTRAMUSCULAR | Status: DC | PRN
  Administered 2015-09-09 (×2): 50 ug via INTRAVENOUS

## 2015-09-09 MED ORDER — WITCH HAZEL-GLYCERIN EX PADS: 1.0000 "application " | MEDICATED_PAD | CUTANEOUS | Status: DC | PRN

## 2015-09-09 MED ORDER — COCONUT OIL OIL: 1.0000 "application " | TOPICAL_OIL | Status: DC | PRN

## 2015-09-09 MED ORDER — DEXAMETHASONE SODIUM PHOSPHATE 10 MG/ML IJ SOLN
INTRAMUSCULAR | Status: AC
  Filled 2015-09-09: qty 1

## 2015-09-09 MED ORDER — MIDAZOLAM HCL 5 MG/5ML IJ SOLN
INTRAMUSCULAR | Status: DC | PRN
  Administered 2015-09-09: 2 mg via INTRAVENOUS

## 2015-09-09 MED ORDER — PROPOFOL 10 MG/ML IV BOLUS
INTRAVENOUS | Status: AC
  Filled 2015-09-09: qty 40

## 2015-09-09 MED ORDER — OXYCODONE HCL 5 MG PO TABS: 5.0000 mg | ORAL_TABLET | ORAL | Status: DC | PRN

## 2015-09-09 MED ORDER — OXYTOCIN 40 UNITS IN LACTATED RINGERS INFUSION - SIMPLE MED
2.5000 [IU]/h | INTRAVENOUS | Status: DC
  Administered 2015-09-09: 2.5 [IU]/h via INTRAVENOUS

## 2015-09-09 MED ORDER — MEASLES, MUMPS & RUBELLA VAC ~~LOC~~ INJ
0.5000 mL | INJECTION | Freq: Once | SUBCUTANEOUS | Status: DC
  Filled 2015-09-09: qty 0.5

## 2015-09-09 MED ORDER — SODIUM CHLORIDE 0.9 % IR SOLN
Status: DC | PRN
  Administered 2015-09-09: 1000 mL

## 2015-09-09 MED ORDER — ONDANSETRON HCL 4 MG/2ML IJ SOLN
INTRAMUSCULAR | Status: AC
  Filled 2015-09-09: qty 2

## 2015-09-09 MED ORDER — BENZOCAINE-MENTHOL 20-0.5 % EX AERO: 1.0000 "application " | INHALATION_SPRAY | CUTANEOUS | Status: DC | PRN

## 2015-09-09 MED ORDER — FENTANYL CITRATE (PF) 100 MCG/2ML IJ SOLN
INTRAMUSCULAR | Status: AC
Start: 2015-09-09 — End: 2015-09-09
  Filled 2015-09-09: qty 2

## 2015-09-09 MED ORDER — LACTATED RINGERS IV SOLN
INTRAVENOUS | Status: DC | PRN
  Administered 2015-09-09: 05:00:00 via INTRAVENOUS

## 2015-09-09 SURGICAL SUPPLY — 22 items
ADAPTER VACURETTE TBG SET 14 (CANNULA) ×2 IMPLANT
CATH ROBINSON RED A/P 16FR (CATHETERS) ×2 IMPLANT
CLOTH BEACON ORANGE TIMEOUT ST (SAFETY) ×2 IMPLANT
DECANTER SPIKE VIAL GLASS SM (MISCELLANEOUS) ×2 IMPLANT
GLOVE BIO SURGEON STRL SZ8 (GLOVE) ×2 IMPLANT
GLOVE BIOGEL PI IND STRL 7.0 (GLOVE) ×1 IMPLANT
GLOVE BIOGEL PI INDICATOR 7.0 (GLOVE) ×1
GLOVE ORTHO TXT STRL SZ7.5 (GLOVE) ×2 IMPLANT
GOWN STRL REUS W/TWL LRG LVL3 (GOWN DISPOSABLE) ×4 IMPLANT
KIT BERKELEY 1ST TRIMESTER 3/8 (MISCELLANEOUS) ×2 IMPLANT
NS IRRIG 1000ML POUR BTL (IV SOLUTION) ×2 IMPLANT
PACK VAGINAL MINOR WOMEN LF (CUSTOM PROCEDURE TRAY) ×2 IMPLANT
PAD OB MATERNITY 4.3X12.25 (PERSONAL CARE ITEMS) ×2 IMPLANT
PAD PREP 24X48 CUFFED NSTRL (MISCELLANEOUS) ×2 IMPLANT
SET BERKELEY SUCTION TUBING (SUCTIONS) ×2 IMPLANT
TOWEL OR 17X24 6PK STRL BLUE (TOWEL DISPOSABLE) ×4 IMPLANT
VACURETTE 10 RIGID CVD (CANNULA) IMPLANT
VACURETTE 12 RIGID CVD (CANNULA) ×2 IMPLANT
VACURETTE 14MM CVD 1/2 BASE (CANNULA) ×2 IMPLANT
VACURETTE 7MM CVD STRL WRAP (CANNULA) IMPLANT
VACURETTE 8 RIGID CVD (CANNULA) IMPLANT
VACURETTE 9 RIGID CVD (CANNULA) IMPLANT

## 2015-09-09 NOTE — Progress Notes (Signed)
This was my first time meeting Ms Christell ConstantMoore, but a follow up of spiritual care services as Peter Kiewit SonsChaplains  [place] and [place] have been working with her.    Windie reports that she is "doing good".  She said that she's feeling okay physically and has been able to rest which is helpful.  She had her grandmother and god sister in the room and was not forthcoming about her feelings.  She did indicate that her SO knows about the loss of the babies and said he is going to support her, but what he says and what he does are two different things.  Pt did not express emotions about the loss of her daughters.  I will return this afternoon in an attempt to see her when she does not have visitors present.  Please page as further needs arise.  Maryanna ShapeAmanda M. Carley Hammedavee Lomax, M.Div. [facility] Chaplain Pager [phone] Office [phone]

## 2015-09-09 NOTE — Discharge Summary (Signed)
OB Discharge Summary     Patient Name: Cheryl Cohen DOB: [birth year] MRN: [id number]  Date of admission: 09/08/2015 Delivering MD:    Cheryl Cohen FD [[id number]]  Cheryl Cohen FD [[id number]]  Cheryl Cohen   Date of discharge: 09/09/2015  Admitting diagnosis: induction, direct admit 25w retained placenta IUFD Intrauterine pregnancy: [redacted]w[redacted]d     Secondary diagnosis:  Active Problems:   Fetal demise, greater than 22 weeks, antepartum   Twin pregnancy, delivered vaginally, current hospitalization  Additional problems: retained placenta, D&C     Discharge diagnosis: Preterm Pregnancy Delivered                                                                                                Post partum procedures:curettage   Augmentation: Cytotec  Complications: Stillborn x 2  Hospital course:  Induction of Labor With Vaginal Delivery   25 y.o. yo G1P0100 at [redacted]w[redacted]d was admitted to the hospital 09/08/2015 for induction of labor.  Indication for induction: IUFD x 2.  Patient had an uncomplicated labor course as follows: Membrane Rupture Time/Date:    Cheryl Cohen FD [[id number]]  2:50 AM   Cheryl Cohen FD [[id number]]  3:27 AM ,   Cheryl Cohen FD [[id number]]  09/09/2015   Cheryl Cohen FD [[id number]]  09/09/2015   Intrapartum Procedures: Episiotomy:    Cheryl Cohen FD [[id number]]  None [1]   Cheryl Cohen FD [[id number]]  None [1]                                         Lacerations:     Cheryl Cohen FD [[id number]]  None [1]   Cheryl Cohen FD [[id number]]     Patient had delivery of a Non Viable infants.  Information for the patient's newborn:  Cheryl Cohen [[id number]]    Information for the patient's newborn:  Cheryl Cohen [[id number]]        Cheryl Cohen FD [[id number]]  09/09/2015   Cheryl Cohen FD [[id number]]  09/09/2015  Details of delivery can be found in  separate delivery note.  Pt with retained placenta - D&C.  After this, patient had a routine postpartum course. Patient is discharged home 09/09/2015 at her request.   Physical exam  Filed Vitals:   09/09/15 0730 09/09/15 0745 09/09/15 0800 09/09/15 0900  BP: 106/63 107/63 110/57 104/60  Pulse: 72 78 75 65  Temp:  98 F (36.7 C) 98.1 F (36.7 C) 98.1 F (36.7 C)  TempSrc:    Oral  Resp: Height:      Weight:      SpO2: 99% 96% 99% 97%   General: alert and no distress Lochia: appropriate Uterine Fundus: firm  Labs: Lab Results  Component Value Date   WBC 13.0* 09/08/2015   HGB 13.3 09/08/2015   HCT 36.9 09/08/2015   MCV 89.3 09/08/2015   PLT 254 09/08/2015   CMP Latest Ref Rng 01/05/2015  Glucose 65 - 99  mg/dL 79  BUN 6 - 20 mg/dL 11  Creatinine 1.610.44 - 0.961.00 mg/dL 0.450.73  Sodium 409135 - 811145 mmol/L 137  Potassium 3.5 - 5.1 mmol/L 3.6  Chloride 101 - 111 mmol/L 105  CO2 22 - 32 mmol/L 27  Calcium 8.9 - 10.3 mg/dL 9.3  Total Protein 6.5 - 8.1 g/dL 7.7  Total Bilirubin 0.3 - 1.2 mg/dL 1.0  Alkaline Phos 38 - 126 U/L 59  AST 15 - 41 U/L 25  ALT 14 - 54 U/L 17    Discharge instruction: per After Visit Summary and "Baby and Me Booklet".  After visit meds:    Medication List    TAKE these medications        ibuprofen 800 MG tablet  Commonly known as:  ADVIL,MOTRIN  Take 1 tablet (800 mg total) by mouth every 8 (eight) hours as needed for moderate pain.     sertraline 25 MG tablet  Commonly known as:  ZOLOFT  Take 25 mg by mouth at bedtime.        Diet: routine diet  Activity: Advance as tolerated. Pelvic rest for 6 weeks.   Outpatient follow up:4-6 weeks Follow up Appt:No future appointments. Follow up Visit:No Follow-up on file.  Postpartum contraception: Not Discussed  Newborn Data:   Cheryl Cohen FD [[id number]][[id number]]  Live born female  Birth Weight: 10.4 oz (295 g) APGAR: 0, 0   Cheryl Cohen FD [[id number]][[id number]]  Live born female   Birth Weight: 13.9 oz (394 g) APGAR: 0, 0   Disposition:morgue x 2   09/09/2015 Cheryl ReinBovard-Stuckert, [name], MD

## 2015-09-09 NOTE — Progress Notes (Signed)
Chaplain paged to provide spiritual and emotional support for Cheryl Cheryl Cohen after the fetal demise of both her twin girls at 6622 + weeks. When chaplain arrived at 0700 Cheryl Cheryl Cohen was in the PICU not awake for a visit. Chaplain will monitor Cheryl Cohen's situation and will alert daytime chaplains if a visit is not possible before shift transition.  Benjie Karvonenharles D. Dorotha Hirschi, DMin, MDiv Chaplain

## 2015-09-09 NOTE — Progress Notes (Signed)
Request D&C.

## 2015-09-09 NOTE — Op Note (Signed)
Preoperative Diagnosis: Retained placenta Postop Diagnosis:  Same Procedure:  D&C Anesthesia:  MAC, paracervical block Findings:  Placental fragments removed with suction curettage Specimens: Placental fragments sent for routine pathology Estimated blood loss: 50 Complications: None  Procedure in detail: The patient was taken to the operating room and placed in the dorsosupine position. IV sedation was given and she was placed and mobile stirrups. Perineum and vagina were prepped and draped in the usual sterile fashion, bladder drained with a red Robinson catheter. With fundal massage and moderate traction on the cords, the placenta would not deliver and the cords eventually pulled off the placenta. A Graves speculum was inserted in the vagina. The anterior lip of the cervix was grasped with a single-tooth tenaculum.  Sharp curettage did not retrieve any placental tissue.  A size 12 curved suction curet was then inserted without difficulty. Suction curettage was return was performed with return of substantial pieces of placenta. Sharp curettage was performed with which revealed good uterine cry in all quadrants and no significant tissue. Suction curettage was performed one more time which revealed minimal blood. The single-tooth tenaculum was removed from the cervix and bleeding was controlled with pressure. All instruments were removed from the vagina. The patient was taken to the recovery in stable condition after tolerating the procedure well. Counts were correct, and she had PAS hose on throughout the procedure.

## 2015-09-09 NOTE — Progress Notes (Signed)
Walked in- Pt Nitrous  Mask sitting on floor- pt states she doesn't need at this time-  RN turned off. Pt resting

## 2015-09-09 NOTE — Anesthesia Preprocedure Evaluation (Addendum)
Anesthesia Evaluation  Patient identified by MRN, date of birth, ID band Patient awake    Reviewed: Allergy & Precautions, NPO status , Patient's Chart, lab work & pertinent test results  Airway Mallampati: II  TM Distance: >3 FB Neck ROM: Full    Dental no notable dental hx.    Pulmonary neg pulmonary ROS, former smoker,    Pulmonary exam normal breath sounds clear to auscultation       Cardiovascular negative cardio ROS Normal cardiovascular exam Rhythm:Regular Rate:Normal     Neuro/Psych Anxiety Depression Bipolar Disorder negative neurological ROS     GI/Hepatic negative GI ROS, Neg liver ROS,   Endo/Other  negative endocrine ROS  Renal/GU negative Renal ROS  negative genitourinary   Musculoskeletal negative musculoskeletal ROS (+)   Abdominal   Peds negative pediatric ROS (+)  Hematology negative hematology ROS (+)   Anesthesia Other Findings   Reproductive/Obstetrics (+) Pregnancy IUFD twins                            Anesthesia Physical Anesthesia Plan  ASA: II  Anesthesia Plan: MAC   Post-op Pain Management:    Induction:   Airway Management Planned: Natural Airway  Additional Equipment:   Intra-op Plan:   Post-operative Plan:   Informed Consent: I have reviewed the patients History and Physical, chart, labs and discussed the procedure including the risks, benefits and alternatives for the proposed anesthesia with the patient or authorized representative who has indicated his/her understanding and acceptance.   Dental advisory given  Plan Discussed with: CRNA  Anesthesia Plan Comments:        Anesthesia Quick Evaluation

## 2015-09-09 NOTE — Progress Notes (Addendum)
Patient ID: Cheryl Cohen, female   DOB: October 01, 1990, 25 y.o.   MRN: [id number]  No c/o's.  Bleeding WNL, pain controlled  AFVSS gen NAD Abd soft, FFNT  Pt desires d/c to home. D/W pt bleeding and pain precautions and reasons to call Will d/c with Motrin and MVI F/u 4-6 weeks D/w patient support and resources.

## 2015-09-09 NOTE — Progress Notes (Signed)
Waiting on placenta

## 2015-09-09 NOTE — Anesthesia Procedure Notes (Signed)
Procedure Name: MAC Date/Time: 09/09/2015 4:59 AM Performed by: Sahar Ryback, Jannet AskewHARLESETTA M Oxygen Delivery Method: Circle system utilized Preoxygenation: Pre-oxygenation with 100% oxygen Intubation Type: IV induction

## 2015-09-09 NOTE — Transfer of Care (Signed)
Immediate Anesthesia Transfer of Care Note  Patient: Cheryl Cohen  Procedure(s) Performed: Procedure(s): DILATATION AND EVACUATION (N/A)  Patient Location: PACU  Anesthesia Type:MAC  Level of Consciousness: awake, alert  and oriented  Airway & Oxygen Therapy: Patient Spontanous Breathing and Patient connected to nasal cannula oxygen  Post-op Assessment: Report given to RN and Post -op Vital signs reviewed and stable  Post vital signs: Reviewed and stable  Last Vitals:  Filed Vitals:   09/09/15 0354 09/09/15 0425  BP: 121/66 119/70  Pulse: 85 88  Temp: 37.1 C   Resp: 16     Last Pain:  Filed Vitals:   09/09/15 0533  PainSc: 0-No pain      Patients Stated Pain Goal: 0 (09/09/15 0245)  Complications: No apparent anesthesia complications

## 2015-09-10 ENCOUNTER — Encounter (HOSPITAL_COMMUNITY): Payer: Self-pay | Admitting: Obstetrics and Gynecology

## 2015-09-10 NOTE — Anesthesia Postprocedure Evaluation (Signed)
Anesthesia Post Note  Patient: Cheryl Cohen  Procedure(s) Performed: Procedure(s) (LRB): DILATATION AND EVACUATION (N/A)  Patient location during evaluation: PACU Anesthesia Type: MAC Level of consciousness: awake and alert Pain management: pain level controlled Vital Signs Assessment: post-procedure vital signs reviewed and stable Respiratory status: spontaneous breathing, nonlabored ventilation, respiratory function stable and patient connected to nasal cannula oxygen Cardiovascular status: stable and blood pressure returned to baseline Anesthetic complications: no    Last Vitals:  Filed Vitals:   09/09/15 0800 09/09/15 0900  BP: 110/57 104/60  Pulse: 75 65  Temp: 36.7 C 36.7 C  Resp: 18 18    Last Pain:  Filed Vitals:   09/09/15 0901  PainSc: 0-No pain                 Phillips Groutarignan, Omarion Minnehan

## 2015-09-11 ENCOUNTER — Encounter (HOSPITAL_COMMUNITY): Payer: Self-pay | Admitting: *Deleted

## 2015-09-11 ENCOUNTER — Inpatient Hospital Stay (HOSPITAL_COMMUNITY)
Admission: AD | Admit: 2015-09-11 | Discharge: 2015-09-12 | Disposition: A | Discharge status: Home / Self Care | Payer: BLUE CROSS/BLUE SHIELD | Source: Ambulatory Visit | Attending: Obstetrics and Gynecology | Admitting: Obstetrics and Gynecology

## 2015-09-11 DIAGNOSIS — Z880 Allergy status to penicillin: Secondary | ICD-10-CM | POA: Insufficient documentation

## 2015-09-11 DIAGNOSIS — O8612 Endometritis following delivery: Secondary | ICD-10-CM | POA: Diagnosis not present

## 2015-09-11 DIAGNOSIS — Z87891 Personal history of nicotine dependence: Secondary | ICD-10-CM | POA: Insufficient documentation

## 2015-09-11 LAB — URINE MICROSCOPIC-ADD ON

## 2015-09-11 LAB — URINALYSIS, ROUTINE W REFLEX MICROSCOPIC
BILIRUBIN URINE: NEGATIVE
BILIRUBIN URINE: NEGATIVE
Glucose, UA: NEGATIVE mg/dL
Glucose, UA: 100 mg/dL — AB
HGB URINE DIPSTICK: NEGATIVE
KETONES UR: NEGATIVE mg/dL
Ketones, ur: NEGATIVE mg/dL
NITRITE: NEGATIVE
NITRITE: NEGATIVE
PH: 7.5 (ref 5.0–8.0)
PROTEIN: 100 mg/dL — AB
Protein, ur: 30 mg/dL — AB
SPECIFIC GRAVITY, URINE: 1.015 (ref 1.005–1.030)
SPECIFIC GRAVITY, URINE: 1.015 (ref 1.005–1.030)
pH: 8 (ref 5.0–8.0)

## 2015-09-11 LAB — CBC WITH DIFFERENTIAL/PLATELET
BASOS PCT: 0 %
Basophils Absolute: 0 10*3/uL (ref 0.0–0.1)
EOS ABS: 0.1 10*3/uL (ref 0.0–0.7)
Eosinophils Relative: 0 %
HEMATOCRIT: 36.5 % (ref 36.0–46.0)
HEMOGLOBIN: 13 g/dL (ref 12.0–15.0)
Lymphocytes Relative: 12 %
Lymphs Abs: 2.2 10*3/uL (ref 0.7–4.0)
MCH: 32.4 pg (ref 26.0–34.0)
MCHC: 35.6 g/dL (ref 30.0–36.0)
MCV: 91 fL (ref 78.0–100.0)
MONOS PCT: 6 %
Monocytes Absolute: 1 10*3/uL (ref 0.1–1.0)
NEUTROS ABS: 14.7 10*3/uL — AB (ref 1.7–7.7)
NEUTROS PCT: 82 %
Platelets: 212 10*3/uL (ref 150–400)
RBC: 4.01 MIL/uL (ref 3.87–5.11)
RDW: 13.2 % (ref 11.5–15.5)
WBC: 18.1 10*3/uL — AB (ref 4.0–10.5)

## 2015-09-11 MED ORDER — CLINDAMYCIN HCL 300 MG PO CAPS
300.0000 mg | ORAL_CAPSULE | Freq: Once | ORAL | Status: AC
  Administered 2015-09-12: 300 mg via ORAL
  Filled 2015-09-11 (×2): qty 1

## 2015-09-11 NOTE — MAU Provider Note (Signed)
Chief Complaint: Chills; Fever; and Generalized Body Aches   First Provider Initiated Contact with Patient 09/11/15 2233     SUBJECTIVE HPI: Cheryl Cohen is a 25 y.o. G1P0100 at postpartum day 2 after delivery of 25 week twin fetal demise and post op day 2 status post D&C for retained placenta who presents to [facility] reporting low-grade fever, chills and body aches today. Took Motrin with no improvement of symptoms.  Associated signs and symptoms: Positive for mild breast soreness and engorgement bilaterally, small to moderate lochia. Negative for breast mass, abdominal pain, urinary complaints, GI complaints, respiratory complaints or any other obvious signs of infection.  Past Medical History  Diagnosis Date  . Depression   . Anxiety   . Bipolar 1 disorder (HCC)     hospitalized  3 x onset rx in 9th grade   . Syphilis   . Gonorrhea   . Hx of varicella   . Twin pregnancy, delivered vaginally, current hospitalization 09/09/2015   OB History  Gravida Para Term Preterm AB SAB TAB Ectopic Multiple Living  0    # Outcome Date GA Lbr Len/2nd Weight Sex Delivery Anes PTL Lv  1A Preterm 09/09/15 [redacted]w[redacted]d 00:50 / 00:25 10.4 oz (0.295 kg) F Vag-Spont None  FD  1B Preterm 09/09/15 [redacted]w[redacted]d 00:50 / 00:43 13.9 oz (0.394 kg) F Vag-Spont None  FD     Past Surgical History  Procedure Laterality Date  . Tympanostomy tube placement      asage 2-3 yrs  . Dilation and evacuation N/A 09/09/2015    Procedure: DILATATION AND EVACUATION;  Surgeon: [name], MD;  Location: [facility];  Service: Gynecology;  Laterality: N/A;   Social History   Social History  . Marital Status: Single    Spouse Name: N/A  . Number of Children: N/A  . Years of Education: N/A   Occupational History  . Not on file.   Social History Main Topics  . Smoking status: Former Smoker -- 0.20 packs/day    Types: Cigarettes  . Smokeless tobacco: Not on file     Comment: 1 cigarette a day  . Alcohol  Use: No  . Drug Use: No  . Sexual Activity: No   Other Topics Concern  . 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   Current Outpatient Prescriptions on File Prior to Encounter  Medication Sig Dispense Refill  . ibuprofen (ADVIL,MOTRIN) 800 MG tablet Take 1 tablet (800 mg total) by mouth every 8 (eight) hours as needed for moderate pain. 45 tablet 1  . sertraline (ZOLOFT) 25 MG tablet Take 25 mg by mouth at bedtime.  0   Allergies  Allergen Reactions  . Amoxicillin Rash    Has patient had a PCN reaction causing immediate rash, facial/tongue/throat swelling, SOB or lightheadedness with hypotension: NO Has patient had a PCN reaction causing severe rash involving mucus membranes or skin necrosis: NO Has patient had a PCN reaction that required hospitalization NO Has patient had a PCN reaction occurring within the last 10 years: Yes If all of the above answers are "NO", then may proceed with  Cephalosporin use.     I have reviewed the past Medical Hx, Surgical Hx, Social Hx, Allergies and Medications.   Review of Systems  Constitutional: Positive for fever (99.5) and chills. Negative for appetite change.  HENT: Negative for congestion, ear pain, rhinorrhea and sore throat.   Respiratory: Negative for cough.   Gastrointestinal: Negative for nausea, vomiting, abdominal pain and diarrhea.  Genitourinary: Positive for vaginal bleeding. Negative for dysuria, urgency, frequency, hematuria, flank pain, vaginal pain and pelvic pain.  Musculoskeletal: Positive for myalgias. Negative for back pain and neck pain.  Neurological: Negative for headaches.    OBJECTIVE Patient Vitals for the past 24 hrs:  BP Temp Temp src Pulse Resp SpO2 Height Weight  09/12/15  0052 100/59 mmHg 99.6 F (37.6 C) Oral 103 18 - - -  09/11/15 2152 - - - - - 99 % - -  09/11/15 2151 111/74 mmHg 99.7 F (37.6 C) - 105 18 - 5\' 7"  (1.702 m) 154 lb 6.4 oz (70.035 kg)   Constitutional: Well-developed, well-nourished female in no acute distress.  Cardiovascular: Mild tachycardia Respiratory: normal rate and effort. Clear to auscultation bilaterally. Breast: Mild bilateral engorgement. No dominant masses, nontender. GI: Abd soft, non-tender, fundus firm, 3/SP Neurologic: Alert and oriented x 4.  GU: Neg CVAT.  Bimanual EXAM: NEFG, small amount of lochia rubra, normal odor. Cervix closed; uterus 14-week size, no adnexal tenderness or masses. Mild CMT.  LAB RESULTS Results for orders placed or performed during the hospital encounter of 09/11/15 (from the past 24 hour(s))  Urinalysis, Routine w reflex microscopic (not at [facility])     Status: Abnormal   Collection Time: 09/11/15 10:00 PM  Result Value Ref Range   Color, Urine YELLOW YELLOW   APPearance CLEAR CLEAR   Specific Gravity, Urine 1.015 1.005 - 1.030   pH 7.5 5.0 - 8.0   Glucose, UA NEGATIVE NEGATIVE mg/dL   Hgb urine dipstick LARGE (A) NEGATIVE   Bilirubin Urine NEGATIVE NEGATIVE   Ketones, ur NEGATIVE NEGATIVE mg/dL   Protein, ur 30 (A) NEGATIVE mg/dL   Nitrite NEGATIVE NEGATIVE   Leukocytes, UA MODERATE (A) NEGATIVE  Urine microscopic-add on     Status: Abnormal   Collection Time: 09/11/15 10:00 PM  Result Value Ref Range   Squamous Epithelial / LPF 0-5 (A) NONE SEEN   WBC, UA 6-30 0 - 5 WBC/hpf   RBC / HPF 6-30 0 - 5 RBC/hpf   Bacteria, UA FEW (A) NONE SEEN  CBC with Differential/Platelet     Status: Abnormal   Collection Time: 09/11/15 10:06 PM  Result Value Ref Range   WBC 18.1 (H) 4.0 - 10.5 K/uL   RBC 4.01 3.87 - 5.11 MIL/uL   Hemoglobin 13.0 12.0 - 15.0 g/dL   HCT 04.536.5 40.936.0 - 81.146.0 %   MCV 91.0 78.0 - 100.0 fL   MCH 32.4 26.0 - 34.0 pg   MCHC 35.6 30.0 - 36.0 g/dL   RDW 91.413.2 78.211.5 - 95.615.5 %    Platelets 212 150 - 400 K/uL   Neutrophils Relative % 82 %   Neutro Abs 14.7 (H) 1.7 - 7.7 K/uL   Lymphocytes Relative 12 %   Lymphs Abs 2.2 0.7 - 4.0 K/uL   Monocytes Relative 6 %   Monocytes Absolute 1.0 0.1 - 1.0 K/uL   Eosinophils Relative 0 %   Eosinophils Absolute 0.1 0.0 - 0.7 K/uL   Basophils Relative 0 %   Basophils Absolute 0.0 0.0 - 0.1 K/uL  Urinalysis,  Routine w reflex microscopic (not at [facility])     Status: Abnormal   Collection Time: 09/11/15 10:58 PM  Result Value Ref Range   Color, Urine YELLOW YELLOW   APPearance CLEAR CLEAR   Specific Gravity, Urine 1.015 1.005 - 1.030   pH 8.0 5.0 - 8.0   Glucose, UA 100 (A) NEGATIVE mg/dL   Hgb urine dipstick NEGATIVE NEGATIVE   Bilirubin Urine NEGATIVE NEGATIVE   Ketones, ur NEGATIVE NEGATIVE mg/dL   Protein, ur 865 (A) NEGATIVE mg/dL   Nitrite NEGATIVE NEGATIVE   Leukocytes, UA TRACE (A) NEGATIVE  Urine microscopic-add on     Status: Abnormal   Collection Time: 09/11/15 10:58 PM  Result Value Ref Range   Squamous Epithelial / LPF 0-5 (A) NONE SEEN   WBC, UA 0-5 0 - 5 WBC/hpf   RBC / HPF 0-5 0 - 5 RBC/hpf   Bacteria, UA FEW (A) NONE SEEN   Urine-Other MUCOUS PRESENT    UA repeated with catheter specimen due to possible contamination with lochia.   IMAGING No results found.  MAU COURSE UA, CBC with differential.  Discussed history, exam, labs with Dr. Senaida Ores. Agrees with plan of care. Suspects symptoms are from mild endometritis. Will give dose of Cleocin here in MAU since patient has amoxicillin allergy.  MDM - 25 year old female 2 days status post vaginal delivery and D&C for retained placenta with possible mild endometritis. No evidence of emergent condition.  ASSESSMENT 1. Postpartum endometritis     PLAN Discharge home in stable conditionPer consult with Dr. Senaida Ores. Endometritis Precautions Rx Cleocin. Rx Diflucan in case used infection develops. Continue ibuprofen on schedule. Apply cabbage  leaves to breasts as needed for engorgement.     Follow-up Information    Follow up with MEISINGER,TODD D, MD. Call on 09/13/2015.   Specialty:  Obstetrics and Gynecology   Why:  to schedule follow-up appointment   Contact information:   [address] [place] [address] [phone]       Follow up with [facility]ERN[facility] ADMISS[facility].   Why:  As needed if symptoms worsen   Contact information:   [address] [id number] mc [place] [address] [phone]       Medication List    TAKE these medications        clindamycin 300 MG capsule  Commonly known as:  CLEOCIN  Take 2 capsules (600 mg total) by mouth 3 (three) times daily.     fluconazole 150 MG tablet  Commonly known as:  DIFLUCAN  Take 1 tablet (150 mg total) by mouth once. Can take additional dose three days later if symptoms persist     ibuprofen 800 MG tablet  Commonly known as:  ADVIL,MOTRIN  Take 1 tablet (800 mg total) by mouth every 8 (eight) hours as needed for moderate pain.     sertraline 25 MG tablet  Commonly known as:  ZOLOFT  Take 25 mg by mouth at bedtime.         Knowles, CNM 09/12/2015  1:08 AM

## 2015-09-11 NOTE — MAU Note (Signed)
I was admitted this past Weds with 25wk twins with no heartbeats. Delivered vag Thurs and had to have D&C to remove placenta. [facility]. Today started having chills, bodyaches, fever. Took Ibuprofen which helped but then symptoms come back. Vag bleeding minimal with no odor.

## 2015-09-12 DIAGNOSIS — O8612 Endometritis following delivery: Secondary | ICD-10-CM

## 2015-09-12 MED ORDER — FLUCONAZOLE 150 MG PO TABS: 150.0000 mg | ORAL_TABLET | Freq: Once | ORAL | Status: DC

## 2015-09-12 MED ORDER — CLINDAMYCIN HCL 300 MG PO CAPS: 600.0000 mg | ORAL_CAPSULE | Freq: Three times a day (TID) | ORAL | Status: DC

## 2015-09-12 NOTE — Discharge Instructions (Signed)
Endometritis °Endometritis is an irritation, soreness, and swelling (inflammation) of the lining of the uterus (endometrium).  °CAUSES  °· Bacterial infections. °· Sexually transmitted infections (STIs). °· Having a miscarriage or childbirth, especially after a long labor or cesarean delivery. °· Certain gynecological procedures (such as dilation and curettage, hysteroscopy, or contraceptive insertion). °SIGNS AND SYMPTOMS  °· Fever. °· Lower abdominal or pelvic pain. °· Abnormal vaginal discharge or bleeding. °· Abdominal bloating (distention) or swelling. °· General discomfort or ill feeling. °· Discomfort with bowel movements. °DIAGNOSIS  °A physical and pelvic exam are performed. Other tests may include: °· Cultures from the cervix. °· Blood tests. °· Examining a tissue sample of the uterine lining (endometrial biopsy). °· Examining discharge under a microscope (wet prep). °· Laparoscopy. °TREATMENT  °Antibiotic medicines are usually given. Other treatments may include: °· Fluids through an IV tube inserted in your vein. °· Rest. °HOME CARE INSTRUCTIONS  °· Take over-the-counter or prescription medicines for pain, discomfort, or fever as directed by your health care provider. °· Take your antibiotics as directed. Finish them even if you start to feel better. °· Resume your normal diet and activities as directed or as tolerated. °· Do not douche or have sexual intercourse until your health care provider says it is okay. °· Do not have sexual intercourse until your partner has been treated if your endometritis is caused by an STI. °SEEK IMMEDIATE MEDICAL CARE IF:  °· You have swelling or increasing pain in the abdomen. °· You have a fever. °· You have bad smelling vaginal discharge, or you have an increased amount of discharge. °· You have abnormal vaginal bleeding. °· Your medicine is not helping with the pain. °· You experience any problems that may be related to the medicine you are taking. °· You have nausea  and vomiting, or you cannot keep foods down. °· You have pain with bowel movements. °MAKE SURE YOU:  °· Understand these instructions. °· Will watch your condition. °· Will get help right away if you are not doing well or get worse. °  °This information is not intended to replace advice given to you by your health care provider. Make sure you discuss any questions you have with your health care provider. °  °Document Released: 03/14/2001 Document Revised: 11/20/2012 Document Reviewed: 10/17/2012 °Elsevier Interactive Patient Education ©2016 [employer] ° °

## 2015-09-24 DIAGNOSIS — F909 Attention-deficit hyperactivity disorder, unspecified type: Secondary | ICD-10-CM | POA: Diagnosis not present

## 2015-09-24 DIAGNOSIS — F319 Bipolar disorder, unspecified: Secondary | ICD-10-CM | POA: Diagnosis not present

## 2015-10-01 DIAGNOSIS — F319 Bipolar disorder, unspecified: Secondary | ICD-10-CM | POA: Diagnosis not present

## 2015-10-01 DIAGNOSIS — F909 Attention-deficit hyperactivity disorder, unspecified type: Secondary | ICD-10-CM | POA: Diagnosis not present

## 2015-10-28 ENCOUNTER — Telehealth: Payer: Self-pay | Admitting: Internal Medicine

## 2015-10-28 DIAGNOSIS — F319 Bipolar disorder, unspecified: Secondary | ICD-10-CM | POA: Diagnosis not present

## 2015-10-28 DIAGNOSIS — F909 Attention-deficit hyperactivity disorder, unspecified type: Secondary | ICD-10-CM | POA: Diagnosis not present

## 2015-10-28 NOTE — Telephone Encounter (Signed)
FYI: Pt needs TB skin test prior to going to school for clinicals by 8/7. Pt was scheduled next week.  However, pt seen last 08/2014 and the only other time 01/11/2012 as a new pt.  Advised pt she should be seen for check up..  Pt verbalized understanding, but refused to make appt at this time.

## 2015-10-29 NOTE — Telephone Encounter (Signed)
Ok to come in for tb skin test.  However, if she needs paperwork filled out than she will need to make an appointment.  Thanks for your help.

## 2015-11-03 ENCOUNTER — Ambulatory Visit (INDEPENDENT_AMBULATORY_CARE_PROVIDER_SITE_OTHER): Payer: BLUE CROSS/BLUE SHIELD | Admitting: Family Medicine

## 2015-11-03 DIAGNOSIS — Z111 Encounter for screening for respiratory tuberculosis: Secondary | ICD-10-CM

## 2015-11-03 DIAGNOSIS — Z113 Encounter for screening for infections with a predominantly sexual mode of transmission: Secondary | ICD-10-CM | POA: Diagnosis not present

## 2015-11-03 DIAGNOSIS — Z7251 High risk heterosexual behavior: Secondary | ICD-10-CM | POA: Diagnosis not present

## 2015-11-03 DIAGNOSIS — Z3009 Encounter for other general counseling and advice on contraception: Secondary | ICD-10-CM | POA: Diagnosis not present

## 2015-11-04 DIAGNOSIS — F909 Attention-deficit hyperactivity disorder, unspecified type: Secondary | ICD-10-CM | POA: Diagnosis not present

## 2015-11-04 DIAGNOSIS — F319 Bipolar disorder, unspecified: Secondary | ICD-10-CM | POA: Diagnosis not present

## 2015-11-05 LAB — TB SKIN TEST
Induration: 0 mm
TB Skin Test: NEGATIVE

## 2015-11-19 DIAGNOSIS — F319 Bipolar disorder, unspecified: Secondary | ICD-10-CM | POA: Diagnosis not present

## 2015-11-19 DIAGNOSIS — F909 Attention-deficit hyperactivity disorder, unspecified type: Secondary | ICD-10-CM | POA: Diagnosis not present

## 2015-12-10 DIAGNOSIS — F909 Attention-deficit hyperactivity disorder, unspecified type: Secondary | ICD-10-CM | POA: Diagnosis not present

## 2015-12-31 DIAGNOSIS — F909 Attention-deficit hyperactivity disorder, unspecified type: Secondary | ICD-10-CM | POA: Diagnosis not present

## 2015-12-31 DIAGNOSIS — F319 Bipolar disorder, unspecified: Secondary | ICD-10-CM | POA: Diagnosis not present

## 2016-02-16 ENCOUNTER — Encounter (HOSPITAL_COMMUNITY): Payer: Self-pay

## 2016-02-16 ENCOUNTER — Emergency Department (HOSPITAL_COMMUNITY)
Admission: EM | Admit: 2016-02-16 | Discharge: 2016-02-17 | Disposition: A | Discharge status: Home / Self Care | Payer: Federal, State, Local not specified - Other | Attending: Emergency Medicine | Admitting: Emergency Medicine

## 2016-02-16 DIAGNOSIS — F319 Bipolar disorder, unspecified: Secondary | ICD-10-CM | POA: Insufficient documentation

## 2016-02-16 DIAGNOSIS — Z87891 Personal history of nicotine dependence: Secondary | ICD-10-CM | POA: Insufficient documentation

## 2016-02-16 DIAGNOSIS — F3162 Bipolar disorder, current episode mixed, moderate: Secondary | ICD-10-CM

## 2016-02-16 DIAGNOSIS — Z79899 Other long term (current) drug therapy: Secondary | ICD-10-CM | POA: Insufficient documentation

## 2016-02-16 LAB — COMPREHENSIVE METABOLIC PANEL
ALBUMIN: 4.6 g/dL (ref 3.5–5.0)
ALK PHOS: 50 U/L (ref 38–126)
ALT: 18 U/L (ref 14–54)
AST: 23 U/L (ref 15–41)
Anion gap: 9 (ref 5–15)
BUN: 11 mg/dL (ref 6–20)
CALCIUM: 9.4 mg/dL (ref 8.9–10.3)
CHLORIDE: 105 mmol/L (ref 101–111)
CO2: 24 mmol/L (ref 22–32)
CREATININE: 0.7 mg/dL (ref 0.44–1.00)
GFR calc non Af Amer: >60 mL/min (ref >60)
GLUCOSE: 90 mg/dL (ref 65–99)
Potassium: 3.6 mmol/L (ref 3.5–5.1)
SODIUM: 138 mmol/L (ref 135–145)
Total Bilirubin: 0.7 mg/dL (ref 0.3–1.2)
Total Protein: 8 g/dL (ref 6.5–8.1)

## 2016-02-16 LAB — RAPID URINE DRUG SCREEN, HOSP PERFORMED
AMPHETAMINES: NOT DETECTED
BARBITURATES: NOT DETECTED
Benzodiazepines: NOT DETECTED
COCAINE: NOT DETECTED
OPIATES: NOT DETECTED
Tetrahydrocannabinol: NOT DETECTED

## 2016-02-16 LAB — CBC WITH DIFFERENTIAL/PLATELET
BASOS ABS: 0 10*3/uL (ref 0.0–0.1)
Basophils Relative: 0 %
Eosinophils Absolute: 0.2 10*3/uL (ref 0.0–0.7)
Eosinophils Relative: 2 %
HEMATOCRIT: 39.6 % (ref 36.0–46.0)
Hemoglobin: 14 g/dL (ref 12.0–15.0)
LYMPHS PCT: 30 %
Lymphs Abs: 2.7 10*3/uL (ref 0.7–4.0)
MCH: 30.4 pg (ref 26.0–34.0)
MCHC: 35.4 g/dL (ref 30.0–36.0)
MCV: 86.1 fL (ref 78.0–100.0)
MONO ABS: 0.9 10*3/uL (ref 0.1–1.0)
Monocytes Relative: 10 %
NEUTROS PCT: 58 %
Neutro Abs: 5.1 10*3/uL (ref 1.7–7.7)
Platelets: 275 10*3/uL (ref 150–400)
RBC: 4.6 MIL/uL (ref 3.87–5.11)
RDW: 12.7 % (ref 11.5–15.5)
WBC: 8.9 10*3/uL (ref 4.0–10.5)

## 2016-02-16 LAB — PREGNANCY, URINE: Preg Test, Ur: NEGATIVE

## 2016-02-16 LAB — ETHANOL: Alcohol, Ethyl (B): <5 mg/dL (ref <5)

## 2016-02-16 MED ORDER — ACETAMINOPHEN 325 MG PO TABS: 650.0000 mg | ORAL_TABLET | ORAL | Status: DC | PRN

## 2016-02-16 MED ORDER — ONDANSETRON HCL 4 MG PO TABS: 4.0000 mg | ORAL_TABLET | Freq: Three times a day (TID) | ORAL | Status: DC | PRN

## 2016-02-16 MED ORDER — SERTRALINE HCL 50 MG PO TABS
25.0000 mg | ORAL_TABLET | Freq: Every day | ORAL | Status: DC
  Administered 2016-02-16: 25 mg via ORAL
  Filled 2016-02-16: qty 1

## 2016-02-16 MED ORDER — LORAZEPAM 1 MG PO TABS: 1.0000 mg | ORAL_TABLET | Freq: Three times a day (TID) | ORAL | Status: DC | PRN

## 2016-02-16 MED ORDER — IBUPROFEN 200 MG PO TABS: 600.0000 mg | ORAL_TABLET | Freq: Three times a day (TID) | ORAL | Status: DC | PRN

## 2016-02-16 NOTE — ED Notes (Signed)
Pt does not want her mother, father, grandmother or grandfather to visit.
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
## 2016-02-16 NOTE — ED Notes (Signed)
Belongings placed in locker #26. 

## 2016-02-16 NOTE — ED Provider Notes (Signed)
[facility]-EMERGENCY DEPT Provider Note   CSN: [id number] Arrival date & time: 02/16/16  1758     History   Chief Complaint Chief Complaint  Patient presents with  . Mental Health Problem    HPI Cheryl Cohen is a 25 y.o. female.  HPI Patient presents feeling overwhelmed. History of bipolar disorder. She has been out of her [place]uda for 2 months. States she has a lot of social issues with her family. States her father has substance abuse. States she lost 2 babies a few months ago. Denies suicidal or homicidal thoughts. She is pleasant but rather pressured speech.  Denies substance abuse. She states she is still on her Zoloft. She has a psychiatrist that she sees.   Past Medical History:  Diagnosis Date  . Anxiety   . Bipolar 1 disorder (HCC)    hospitalized  3 x onset rx in 9th grade   . Depression   . Gonorrhea   . Hx of varicella   . Syphilis   . Twin pregnancy, delivered vaginally, current hospitalization 09/09/2015    Patient Active Problem List   Diagnosis Date Noted  . Fetal demise, greater than 22 weeks, antepartum 09/08/2015  . Depressive disorder, not elsewhere classified 10/06/2013  . Suicidal ideation 10/06/2013  . Psychotropic drug use 01/13/2012  . Bipolar 1 disorder (HCC)   . Sleepiness 01/11/2012  . Fatigue 01/11/2012  . Family history of diabetes mellitus type II 01/11/2012    Past Surgical History:  Procedure Laterality Date  . DILATION AND EVACUATION N/A 09/09/2015   Procedure: DILATATION AND EVACUATION;  Surgeon: Lavina Hammanodd Meisinger, MD;  Location: [facility];  Service: Gynecology;  Laterality: N/A;  . TYMPANOSTOMY TUBE PLACEMENT     asage 2-3 yrs    OB History    Gravida Para Term Preterm AB Living   1 1   1    0   SAB TAB Ectopic Multiple Live Births         1         Home Medications    Prior to Admission medications   Medication Sig Start Date End Date Taking? Authorizing Provider  clindamycin (CLEOCIN) 300 MG capsule Take 2 capsules (600 mg total)  by mouth 3 (three) times daily. 09/12/15   Dorathy KinsmanVirginia Smith, CNM  fluconazole (DIFLUCAN) 150 MG tablet Take 1 tablet (150 mg total) by mouth once. Can take additional dose three days later if symptoms persist 09/12/15   Dorathy KinsmanVirginia Smith, CNM  ibuprofen (ADVIL,MOTRIN) 800 MG tablet Take 1 tablet (800 mg total) by mouth every 8 (eight) hours as needed for moderate pain. 09/09/15   Jody Bovard-Stuckert, MD  sertraline (ZOLOFT) 25 MG tablet Take 25 mg by mouth at bedtime. 09/03/15   Historical Provider, MD    Family History Family History  Problem Relation Age of Onset  . Diabetes Mother   . Arthritis Mother   . Hypertension Father   . Alcohol abuse Father     Social History Social History  Substance Use Topics  . Smoking status: Former Smoker    Packs/day: 0.20    Types: Cigarettes  . Smokeless tobacco: Not on file     Comment: 1 cigarette a day  . Alcohol use No     Allergies   Amoxicillin   Review of Systems Review of Systems  Constitutional: Negative for appetite change.  HENT: Negative for congestion.   Respiratory: Negative for shortness of breath.   Cardiovascular: Negative for chest pain.  Gastrointestinal: Negative for abdominal  pain.  Genitourinary: Negative for dyspareunia.  Musculoskeletal: Negative for back pain.  Neurological: Negative for weakness.  Hematological: Negative for adenopathy.  Psychiatric/Behavioral: Negative for confusion and sleep disturbance. The patient is nervous/anxious.      Physical Exam Updated Vital Signs BP 131/77 (BP Location: Right Arm)   Pulse 97   Temp 98.3 F (36.8 C) (Oral)   Resp 18   SpO2 96%   Physical Exam  Constitutional: She appears well-developed.  HENT:  Head: Atraumatic.  Eyes: EOM are normal.  Neck: Neck supple.  Cardiovascular: Normal rate.   Pulmonary/Chest: Effort normal.  Abdominal: Soft.  Musculoskeletal: Normal range of motion.  Neurological: She is alert.  Skin: Skin is warm.  Psychiatric:  Patient has  very pressured speech. Somewhat tangential also.     ED Treatments / Results  Labs (all labs ordered are listed, but only abnormal results are displayed) Labs Reviewed  COMPREHENSIVE METABOLIC PANEL  ETHANOL  RAPID URINE DRUG SCREEN, HOSP PERFORMED  PREGNANCY, URINE  CBC WITH DIFFERENTIAL/PLATELET    EKG  EKG Interpretation None       Radiology No results found.  Procedures Procedures (including critical care time)  Medications Ordered in ED Medications - No data to display   Initial Impression / Assessment and Plan / ED Course  I have reviewed the triage vital signs and the nursing notes.  Pertinent labs & imaging results that were available during my care of the patient were reviewed by me and considered in my medical decision making (see chart for details).  Clinical Course   Patient presents with bipolar disorder. Reportedly fairly active now. She is very pressured. Denies suicidal or homicidal thoughts. She's been off her what to do. Medically cleared and will be seen by TTS.  Final Clinical Impressions(s) / ED Diagnoses   Final diagnoses:  Bipolar 1 disorder [facility](HCC)    New Prescriptions New Prescriptions   No medications on file     Benjiman CoreNathan Erling Arrazola, MD 02/16/16 2109

## 2016-02-17 DIAGNOSIS — Z87891 Personal history of nicotine dependence: Secondary | ICD-10-CM

## 2016-02-17 DIAGNOSIS — Z833 Family history of diabetes mellitus: Secondary | ICD-10-CM

## 2016-02-17 DIAGNOSIS — F3162 Bipolar disorder, current episode mixed, moderate: Secondary | ICD-10-CM | POA: Diagnosis not present

## 2016-02-17 DIAGNOSIS — Z811 Family history of alcohol abuse and dependence: Secondary | ICD-10-CM

## 2016-02-17 DIAGNOSIS — Z8261 Family history of arthritis: Secondary | ICD-10-CM | POA: Diagnosis not present

## 2016-02-17 DIAGNOSIS — Z9889 Other specified postprocedural states: Secondary | ICD-10-CM | POA: Diagnosis not present

## 2016-02-17 DIAGNOSIS — Z8249 Family history of ischemic heart disease and other diseases of the circulatory system: Secondary | ICD-10-CM

## 2016-02-17 DIAGNOSIS — Z79899 Other long term (current) drug therapy: Secondary | ICD-10-CM

## 2016-02-17 MED ORDER — SERTRALINE HCL 50 MG PO TABS: 50.0000 mg | ORAL_TABLET | Freq: Every day | ORAL | 0 refills | Status: DC

## 2016-02-17 MED ORDER — ZIPRASIDONE HCL 20 MG PO CAPS
20.0000 mg | ORAL_CAPSULE | Freq: Every day | ORAL | Status: DC
  Administered 2016-02-17: 20 mg via ORAL
  Filled 2016-02-17: qty 1

## 2016-02-17 MED ORDER — ZIPRASIDONE HCL 20 MG PO CAPS: 20.0000 mg | ORAL_CAPSULE | Freq: Every day | ORAL | 0 refills | Status: DC

## 2016-02-17 MED ORDER — SERTRALINE HCL 50 MG PO TABS: 50.0000 mg | ORAL_TABLET | Freq: Every day | ORAL | Status: DC

## 2016-02-17 NOTE — Discharge Instructions (Addendum)
Bipolar 1 Disorder Bipolar 1 disorder is a mental health disorder in which a person has episodes of emotional highs (mania), and may also have episodes of emotional lows (depression) in addition to highs. Bipolar 1 disorder is different from other bipolar disorders because it involves extreme manic episodes. These episodes last at least one week or involve symptoms that are so severe that hospitalization is needed to keep the person safe. What increases the risk? The cause of this condition is not known. However, certain factors make you more likely to have bipolar disorder, such as:  Having a family member with the disorder.  An imbalance of certain chemicals in the brain (neurotransmitters).  Stress, such as illness, financial problems, or a death.  Certain conditions that affect the brain or spinal cord (neurologic conditions).  Brain injury (trauma).  Having another mental health disorder, such as:  Obsessive compulsive disorder.  Schizophrenia. What are the signs or symptoms? Symptoms of mania include:  Very high self-esteem or self-confidence.  Decreased need for sleep.  Unusual talkativeness or feeling a need to keep talking. Speech may be very fast. It may seem like you cannot stop talking.  Racing thoughts or constant talking, with quick shifts between topics that may or may not be related (flight of ideas).  Decreased ability to focus or concentrate.  Increased purposeful activity, such as work, studies, or social activity.  Increased nonproductive activity. This could be pacing, squirming and fidgeting, or finger and toe tapping.  Impulsive behavior and poor judgment. This may result in high-risk activities, such as having unprotected sex or spending a lot of money. Symptoms of depression include:  Feeling sad, hopeless, or helpless.  Frequent or uncontrollable crying.  Lack of feeling or caring about anything.  Sleeping too much.  Moving more slowly  than usual.  Not being able to enjoy things you used to enjoy.  Wanting to be alone all the time.  Feeling guilty or worthless.  Lack of energy or motivation.  Trouble concentrating or remembering.  Trouble making decisions.  Increased appetite.  Thoughts of death, or the desire to harm yourself. Sometimes, you may have a mixed mood. This means having symptoms of depression and mania. Stress can make symptoms worse. How is this diagnosed? To diagnose bipolar disorder, your health care provider may ask about your:  Emotional episodes.  Medical history.  Alcohol and drug use. This includes prescription medicines. Certain medical conditions and substances can cause symptoms that seem like bipolar disorder (secondary bipolar disorder). How is this treated? Bipolar disorder is a long-term (chronic) illness. It is best controlled with ongoing (continuous) treatment rather than treatment only when symptoms occur. Treatment may include:  Medicine. Medicine can be prescribed by a provider who specializes in treating mental disorders (psychiatrist).  Medicines called mood stabilizers are usually prescribed.  If symptoms occur even while taking a mood stabilizer, other medicines may be added.  Psychotherapy. Some forms of talk therapy, such as cognitive-behavioral therapy (CBT), can provide support, education, and guidance.  Coping methods, such as journaling or relaxation exercises. These may include:  Yoga.  Meditation.  Deep breathing.  Lifestyle changes, such as:  Limiting alcohol and drug use.  Exercising regularly.  Getting plenty of sleep.  Making healthy eating choices. A combination of medicine, talk therapy, and coping methods is best. A procedure in which electricity is applied to the brain through the scalp (electroconvulsive therapy) may be used in cases of severe mania when medicine and psychotherapy work too  slowly or do not work. Follow these instructions  at home: Activity  Return to your normal activities as told by your health care provider.  Find activities that you enjoy, and make time to do them.  Exercise regularly as told by your health care provider. Lifestyle  Limit alcohol intake to no more than 1 drink a day for nonpregnant women and 2 drinks a day for men. One drink equals 12 oz of beer, 5 oz of wine, or 1 oz of hard liquor.  Follow a set schedule for eating and sleeping.  Eat a balanced diet that includes fresh fruits and vegetables, whole grains, low-fat dairy, and lean meat.  Get 7-8 hours of sleep each night. General instructions  Take over-the-counter and prescription medicines only as told by your health care provider.  Think about joining a support group. Your health care provider may be able to recommend a support group.  Talk with your family and loved ones about your treatment goals and how they can help.  Keep all follow-up visits as told by your health care provider. This is important. 1.Take all your medications as prescribed.  2. Report any adverse side effects to your medication to your outpatient provider. 3. Do not use alcohol or illegal drugs while taking prescription medications.  4. In the event of worsening symptoms, call 911, the crisis hotline or go to nearest emergency room for evaluation of symptoms. Where to find more information: For more information about bipolar disorder, visit the following websites:  [employer]ness: www.nami.org  [employer]: http://www.maynard.net/www.nimh.nih.gov Contact a health care provider if:  Your symptoms get worse.  You have side effects from your medicine, and they get worse.  You have trouble sleeping.  You have trouble doing daily activities.  You feel unsafe in your surroundings.  You are dealing with substance abuse. Get help right away if:  You have new symptoms.  You have thoughts about harming  yourself.  You self-harm. This information is not intended to replace advice given to you by your health care provider. Make sure you discuss any questions you have with your health care provider. Document Released: 06/26/2000 Document Revised: 11/14/2015 Document Reviewed: 11/18/2015 Elsevier Interactive Patient Education  2017 [employer] For your ongoing mental health needs, you are advised to follow up with [facility].  New and returning patients are seen at their walk-in clinic.  Walk-in hours are Monday - Friday from 8:00 am - 3:00 pm.  Walk-in patients are seen on a first come, first served basis.  Try to arrive as early as possible for he best chance of being seen the same day:       [facility]      [address]      [place], [place] [address]      [phone]

## 2016-02-17 NOTE — ED Notes (Signed)
Pt personal belongings removed from locker 26 and [facility].

## 2016-02-17 NOTE — ED Notes (Signed)
Pt d/c home per MD order. Pt denies SI/HI. Denies AVH. Discharge summary reviewed with pt. Pt verbalizes understanding of discharge summary and outpatient resources. RX provided. Bus pass provided per pt request. Pt signed for personal property and property returned. Pt signed e-signature. Ambulatory off unit.

## 2016-02-17 NOTE — ED Notes (Signed)
TTS at bedside. 

## 2016-02-17 NOTE — Consult Note (Signed)
[facility] Consult   Reason for Consult:  Psychiatric Evaluation Referring Physician:  EDP Patient Identification: Cheryl Cohen MRN:  [id number] Principal Diagnosis: Bipolar 1 disorder, mixed, moderate ([name]) Diagnosis:   Patient Active Problem List   Diagnosis Date Noted  . Bipolar 1 disorder, mixed, moderate ([name]) [F31.62] 02/17/2016  . Fetal demise, greater than 22 weeks, antepartum [O36.4XX0] 09/08/2015  . Depressive disorder, not elsewhere classified [F32.9] 10/06/2013  . Suicidal ideation [R45.851] 10/06/2013  . Psychotropic drug use [F19.10] 01/13/2012  . Bipolar 1 disorder ([name]) [F31.9]   . Sleepiness [R40.0] 01/11/2012  . Fatigue [R53.83] 01/11/2012  . Family history of diabetes mellitus type II [Z83.3] 01/11/2012    Total Time spent with patient: 30 minutes  Subjective:   Cheryl Cohen is a 25 y.o. female patient who states "my family is mentally deranged."  HPI:  Per behavioral health therapeutic triage, Cheryl Cohen is an 25 y.o. female.  -Clinician reviewed note from Dr. Alvino Chapel.  Patient presents feeling overwhelmed. History of bipolar disorder. She has been out of her latuda for2 months. States she has a lot of social issues with her family. States her father has substance abuse. States she lost 2 babies a few months ago. Denies suicidal or homicidal thoughts. She is pleasant but rather pressured speech. Denies substance abuse. She states she is still on her Zoloft. She has a psychiatrist that she sees.  Patient is very talkative.  She strays away from questions and will talk about things totally unrelated.  Patient can be redirected but will have to be directed a few times to answer questions.  Patient talked about her father's drinking and past abuse.  She lives with her mother and talks about past emotional abuse of seeing father and mother fighting.  Patient's mother told her to come to [facility].  Patient denies any SI, HI or A/V hallucinations.   However patient talks almost non-stop and references her stillborn twins from June of this year.  Patient says repeatedly that she needs to get her life straightened out.  Patient is seen by Dr. Charna Archer at [facility], her therapy is from there also.  Patient says that she has had numerous psychiatric hospitalizations at [facility], [facility] and has been at [facility].  Patient reports that she takes her medication as prescribed but does say she has not taken [place].  Evaluation on the unit: Chart and nursing notes reviewed. Face-to-face evaluation completed with Dr. Darleene Cleaver. Patient is alert, oriented x 3, calm and cooperative. She states she has been under a lot of stress and has been feeling overwhelmed. She states she delivered stillborn twins in June, 2017. She states she was started on Zoloft after that. She has also been prescribed Latuda but states she stopped taking it 2 months ago due to cost. She was referred to grief therapy but did not participate. She states her insurance will not resume until December 1st. She has been followed by Stephannie Peters at [facility] but has not seen her in the past 2 months. She states she has a strained relationship with her father, who has a history of alcohol abuse. She states she lives with her mom and she is currently working at [employer].  She reports previous hospitalizations to [facility]. She denies suicidal or homicidal ideation, intent or plan. She denies AVH. She denies prior suicide attempts.   Past Psychiatric History: Bipolar 1 disorder, depression  Risk to Self: Suicidal Ideation: No Suicidal Intent: No Is  patient at risk for suicide?: No Suicidal Plan?: No Access to Means: No What has been your use of drugs/alcohol within the last 12 months?: None reported How many times?: 0 Other Self Harm Risks: None Triggers for Past Attempts: None known Intentional Self Injurious Behavior: None Risk to Others: Homicidal  Ideation: No Thoughts of Harm to Others: Yes-Currently Present Comment - Thoughts of Harm to Others: Wants to harm the father of her still born twins Current Homicidal Intent: No Current Homicidal Plan: No Access to Homicidal Means: No Identified Victim: No one History of harm to others?: No Assessment of Violence: None Noted Violent Behavior Description: None noted Does patient have access to weapons?: No Criminal Charges Pending?: No Does patient have a court date: No Prior Inpatient Therapy: Prior Inpatient Therapy: Yes Prior Therapy Dates: 2012, 2013 Prior Therapy Facilty/Provider(s): [facility], [place], [facility] Reason for Treatment: Bi polar d/o Prior Outpatient Therapy: Prior Outpatient Therapy: Yes Prior Therapy Dates: Current Prior Therapy Facilty[facility]Provider[facility] Reason for Treatment: med management & therapy Does patient have an ACCT team?: No Does patient have Intensive In-House Services?  : No Does patient have [facility] services? : No Does patient have [facility] services?: No  Past Medical History:  Past Medical History:  Diagnosis Date  . Anxiety   . Bipolar 1 disorder ([place])    hospitalized  3 x onset rx in 9th grade   . Depression   . Gonorrhea   . Hx of varicella   . Syphilis   . Twin pregnancy, delivered vaginally, current hospitalization 09/09/2015    Past Surgical History:  Procedure Laterality Date  . DILATION AND EVACUATION N/A 09/09/2015   Procedure: DILATATION AND EVACUATION;  Surgeon: Cheri Fowler, MD;  Location: [facility];  Service: Gynecology;  Laterality: N/A;  . TYMPANOSTOMY TUBE PLACEMENT     asage 2-3 yrs   Family History:  Family History  Problem Relation Age of Onset  . Diabetes Mother   . Arthritis Mother   . Hypertension Father   . Alcohol abuse Father    Family Psychiatric  History: Father-alcohol abuse Social History:  History  Alcohol Use No     History  Drug Use No    Social History   Social History  . Marital  status: Single    Spouse name: N/A  . Number of children: N/A  . Years of education: N/A   Social History Main Topics  . Smoking status: Former Smoker    Packs/day: 0.20    Types: Cigarettes  . Smokeless tobacco: None     Comment: 1 cigarette a day  . Alcohol use No  . Drug use: No  . Sexual activity: No   Other Topics Concern  . None   Social History Narrative   hh of 4    Lives at home  [employer] to [employer] for [employer] second year    Working also Engineer, water   Pet dog   Neg tad some caffiene   Some exercise in class    Additional Social History:    Allergies:   Amoxicillin  Labs:  Results for orders placed or performed during the hospital encounter of 02/16/16 (from the past 48 hour(s))  Urine rapid drug screen (hosp performed)     Status: None   Collection Time: 02/16/16  8:00 PM  Result Value Ref Range   Opiates NONE DETECTED NONE DETECTED   Cocaine NONE DETECTED NONE DETECTED   Benzodiazepines NONE DETECTED NONE DETECTED   Amphetamines  NONE DETECTED NONE DETECTED   Tetrahydrocannabinol NONE DETECTED NONE DETECTED   Barbiturates NONE DETECTED NONE DETECTED    Comment:        DRUG SCREEN FOR MEDICAL PURPOSES ONLY.  IF CONFIRMATION IS NEEDED FOR ANY PURPOSE, NOTIFY LAB WITHIN 5 DAYS.        LOWEST DETECTABLE LIMITS FOR URINE DRUG SCREEN Drug Class       Cutoff (ng/mL) Amphetamine      1000 Barbiturate      200 Benzodiazepine   130 Tricyclics       865 Opiates          300 Cocaine          300 THC              50   Pregnancy, urine     Status: None   Collection Time: 02/16/16  8:00 PM  Result Value Ref Range   Preg Test, Ur NEGATIVE NEGATIVE    Comment:        THE SENSITIVITY OF THIS METHODOLOGY IS >20 mIU/mL.   Comprehensive metabolic panel     Status: None   Collection Time: 02/16/16  8:24 PM  Result Value Ref Range   Sodium 138 135 - 145 mmol/L   Potassium 3.6 3.5 - 5.1 mmol/L   Chloride 105 101 - 111 mmol/L   CO2 24 22 - 32 mmol/L    Glucose, Bld 90 65 - 99 mg/dL   BUN 11 6 - 20 mg/dL   Creatinine, Ser 0.70 0.44 - 1.00 mg/dL   Calcium 9.4 8.9 - 10.3 mg/dL   Total Protein 8.0 6.5 - 8.1 g/dL   Albumin 4.6 3.5 - 5.0 g/dL   AST 23 15 - 41 U/L   ALT 18 14 - 54 U/L   Alkaline Phosphatase 50 38 - 126 U/L   Total Bilirubin 0.7 0.3 - 1.2 mg/dL   GFR calc non Af Amer >60 >60 mL/min   GFR calc Af Amer >60 >60 mL/min    Comment: (NOTE) The eGFR has been calculated using the CKD EPI equation. This calculation has not been validated in all clinical situations. eGFR's persistently <60 mL/min signify possible Chronic Kidney Disease.    Anion gap 9 5 - 15  Ethanol     Status: None   Collection Time: 02/16/16  8:24 PM  Result Value Ref Range   Alcohol, Ethyl (B) <5 <5 mg/dL    Comment:        LOWEST DETECTABLE LIMIT FOR SERUM ALCOHOL IS 5 mg/dL FOR MEDICAL PURPOSES ONLY   CBC with Differential     Status: None   Collection Time: 02/16/16  8:24 PM  Result Value Ref Range   WBC 8.9 4.0 - 10.5 K/uL   RBC 4.60 3.87 - 5.11 MIL/uL   Hemoglobin 14.0 12.0 - 15.0 g/dL   HCT 39.6 36.0 - 46.0 %   MCV 86.1 78.0 - 100.0 fL   MCH 30.4 26.0 - 34.0 pg   MCHC 35.4 30.0 - 36.0 g/dL   RDW 12.7 11.5 - 15.5 %   Platelets 275 150 - 400 K/uL   Neutrophils Relative % 58 %   Neutro Abs 5.1 1.7 - 7.7 K/uL   Lymphocytes Relative 30 %   Lymphs Abs 2.7 0.7 - 4.0 K/uL   Monocytes Relative 10 %   Monocytes Absolute 0.9 0.1 - 1.0 K/uL   Eosinophils Relative 2 %   Eosinophils Absolute 0.2 0.0 - 0.7 K/uL  Basophils Relative 0 %   Basophils Absolute 0.0 0.0 - 0.1 K/uL    Current Facility-Administered Medications  Medication Dose Route Frequency Provider Last Rate Last Dose  . acetaminophen (TYLENOL) tablet 650 mg  650 mg Oral Q4H PRN Davonna Belling, MD      . ibuprofen (ADVIL,MOTRIN) tablet 600 mg  600 mg Oral Q8H PRN Davonna Belling, MD      . LORazepam (ATIVAN) tablet 1 mg  1 mg Oral Q8H PRN Davonna Belling, MD      . ondansetron  [facility]) tablet 4 mg  4 mg Oral Q8H PRN Davonna Belling, MD      . sertraline (ZOLOFT) tablet 50 mg  50 mg Oral QHS Lurena Nida, NP      . ziprasidone (GEODON) capsule 20 mg  20 mg Oral Q0600 Lurena Nida, NP       Current Outpatient Prescriptions  Medication Sig Dispense Refill  . B Complex-C (B-COMPLEX WITH VITAMIN C) tablet Take 1 tablet by mouth daily.    Marland Kitchen omega-3 acid ethyl esters (LOVAZA) 1 g capsule Take 1 g by mouth daily.    . sertraline (ZOLOFT) 50 MG tablet Take 1 tablet by mouth daily.  1  . clindamycin (CLEOCIN) 300 MG capsule Take 2 capsules (600 mg total) by mouth 3 (three) times daily. (Patient not taking: Reported on 02/16/2016) 60 capsule 0  . fluconazole (DIFLUCAN) 150 MG tablet Take 1 tablet (150 mg total) by mouth once. Can take additional dose three days later if symptoms persist (Patient not taking: Reported on 02/16/2016) 2 tablet 1  . ibuprofen (ADVIL,MOTRIN) 800 MG tablet Take 1 tablet (800 mg total) by mouth every 8 (eight) hours as needed for moderate pain. (Patient not taking: Reported on 02/16/2016) 45 tablet 1    Musculoskeletal: Strength & Muscle Tone: within normal limits Gait & Station: normal Patient leans: N/A  Psychiatric Specialty Exam: Physical Exam  Nursing note and vitals reviewed.   Review of Systems  Constitutional: Negative.   HENT: Negative.   Eyes: Negative.   Respiratory: Negative.   Cardiovascular: Negative.   Gastrointestinal: Negative.   Genitourinary: Negative.   Musculoskeletal: Negative.   Skin: Negative.   Neurological: Negative.   Endo/Heme/Allergies: Negative.   Psychiatric/Behavioral: Positive for depression.    Blood pressure 101/63, pulse 63, temperature 98.1 F (36.7 C), temperature source Oral, resp. rate 16, SpO2 100 %.There is no height or weight on file to calculate BMI.  General Appearance: Fairly Groomed  Eye Contact:  Good  Speech:  Clear and Coherent and Pressured  Volume:  Normal  Mood:  Anxious   Affect:  Congruent  Thought Process:  Coherent and Goal Directed  Orientation:  Full (Time, Place, and Person)  Thought Content:  Rumination  Suicidal Thoughts:  No  Homicidal Thoughts:  No  Memory:  Immediate;   Fair Recent;   Fair  Judgement:  Intact  Insight:  Present  Psychomotor Activity:  Increased  Concentration:  Concentration: Good and Attention Span: Good  Recall:  Good  Fund of Knowledge:  Good  Language:  Good  Akathisia:  No  Handed:  Right  AIMS (if indicated):     Assets:  Communication Skills Desire for Improvement Housing Physical Health Vocational/Educational  ADL's:  Intact  Cognition:  WNL  Sleep:        Case discussed with Dr. Darleene Cleaver; recommendations are: Disposition: No evidence of imminent risk to self or others at present.   Patient does not meet  criteria for psychiatric inpatient admission. Supportive therapy provided about ongoing stressors.  Refer to [facility] for outpatient follow-up and medication management Rxs for Geodon 20 mg daily for mood stabilization and Zoloft 50 mg daily for depression given at time of discharge.  Serena Colonel, FNP-BC [place] 02/17/2016 11:20 AM  Patient seen face-to-face for psychiatric evaluation, chart reviewed and case discussed with the physician extender and developed treatment plan. Reviewed the information documented and agree with the treatment plan. Corena Pilgrim, MD

## 2016-02-17 NOTE — BH Assessment (Signed)
[facility] Assessment Progress Note   Per Mojeed Akintayo, MD, this pt does not require psychiatric hospitalization at this time.  Pt is to be discharged from [facility] with recommendation to follow up with [facility].  This has been included in pt's discharge instructions.  Pt's nurse, Ashley, has been notified.  Natalia Wittmeyer, MA Triage Specialist [phone]      

## 2016-02-17 NOTE — [facility] Suicide Risk Assessment (Signed)
Suicide Risk Assessment  Discharge Assessment   [facility]   Principal Problem: Bipolar 1 disorder, mixed, moderate ([facility]) Discharge Diagnoses:  Patient Active Problem List   Diagnosis Date Noted  . Bipolar 1 disorder, mixed, moderate (HCC) [F31.62] 02/17/2016  . Fetal demise, greater than 22 weeks, antepartum [O36.4XX0] 09/08/2015  . Depressive disorder, not elsewhere classified [F32.9] 10/06/2013  . Suicidal ideation [R45.851] 10/06/2013  . Psychotropic drug use [F19.10] 01/13/2012  . Bipolar 1 disorder (HCC) [F31.9]   . Sleepiness [R40.0] 01/11/2012  . Fatigue [R53.83] 01/11/2012  . Family history of diabetes mellitus type II [Z83.3] 01/11/2012    Total Time spent with patient: 15 minutes  Musculoskeletal: Strength & Muscle Tone: within normal limits Gait & Station: normal Patient leans: N/A  Psychiatric Specialty Exam:   Blood pressure 101/63, pulse 63, temperature 98.1 F (36.7 C), temperature source Oral, resp. rate 16, SpO2 100 %.There is no height or weight on file to calculate BMI.   General Appearance: Fairly Groomed  Eye Contact:  Good  Speech:  Clear and Coherent and Pressured  Volume:  Normal  Mood:  Anxious  Affect:  Congruent  Thought Process:  Coherent and Goal Directed  Orientation:  Full (Time, Place, and Person)  Thought Content:  Rumination  Suicidal Thoughts:  No  Homicidal Thoughts:  No  Memory:  Immediate;   Fair Recent;   Fair  Judgement:  Intact  Insight:  Present  Psychomotor Activity:  Increased  Concentration:  Concentration: Good and Attention Span: Good  Recall:  Good  Fund of Knowledge:  Good  Language:  Good  Akathisia:  No  Handed:  Right  AIMS (if indicated):     Assets:  Communication Skills Desire for Improvement Housing Physical Health Vocational/Educational  ADL's:  Intact  Cognition:  WNL  Sleep:      Mental Status Per Nursing Assessment::   On Admission:     Demographic Factors:   Adolescent or young adult  Loss Factors: Loss of significant relationship  Historical Factors: Family history of mental illness or substance abuse  Risk Reduction Factors:   Employed, Living with another person, especially a relative and Positive therapeutic relationship  Continued Clinical Symptoms:  Bipolar Disorder:   Mixed State  Cognitive Features That Contribute To Risk:  None    Suicide Risk:  Minimal: No identifiable suicidal ideation.  Patients presenting with no risk factors but with morbid ruminations; may be classified as minimal risk based on the severity of the depressive symptoms  Plan Of Care/Follow-up recommendations:  Activity:  As tolerated Diet:  Regular  1.Take all your medications as prescribed.  2. Report any adverse side effects to your medication to your outpatient provider. 3. Do not use alcohol or illegal drugs while taking prescription medications. 4. In the event of worsening symptoms, call 911, the crisis hotline or go to nearest emergency room for evaluation of symptoms.  Alberteen SamFran Tanner Vigna, FNP-BC [facility] 02/17/2016, 11:22 AM

## 2016-02-17 NOTE — ED Notes (Addendum)
Pt admitted to room #42. Pt presents with manic behavior, tangential speech. Pt reports she was dx with bipolar a couple of years ago and has been in and out of hospitals since. [facility]. Pt reports she has been off Latuda for 2 months .  Pt reports she was started on Zoloft back in June when she lost her twin girls. (stillborn). Pt denies SI/HI. Denies AVH. Pt reports she has been eating and sleeping good. Pt identifies family as a stressor. Pt reports she does not want mother, father, grandmother, or grandfather to visit at this time. Consent to release information/visitation form placed on chart. Special checks q 15 mins in place for safety. Video monitoring in place.

## 2016-02-17 NOTE — ED Notes (Signed)
Pt does not wish to talk to grandfather at this time. Notified pt that her grandfather has been calling and trying to receive information. Pt states she does not wish for her grandfather to have information.
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
## 2016-02-19 ENCOUNTER — Emergency Department (HOSPITAL_COMMUNITY)
Admission: EM | Admit: 2016-02-19 | Discharge: 2016-02-20 | Disposition: A | Discharge status: Other Acute Inpatient Hospital | Payer: Federal, State, Local not specified - Other | Attending: Emergency Medicine | Admitting: Emergency Medicine

## 2016-02-19 ENCOUNTER — Emergency Department (HOSPITAL_COMMUNITY)
Admission: EM | Admit: 2016-02-19 | Discharge: 2016-02-19 | Disposition: A | Discharge status: Home / Self Care | Payer: BLUE CROSS/BLUE SHIELD | Attending: Emergency Medicine | Admitting: Emergency Medicine

## 2016-02-19 ENCOUNTER — Inpatient Hospital Stay (HOSPITAL_COMMUNITY)
Admission: AD | Admit: 2016-02-19 | Payer: Federal, State, Local not specified - Other | Source: Intra-hospital | Admitting: Psychiatry

## 2016-02-19 ENCOUNTER — Encounter (HOSPITAL_COMMUNITY): Payer: Self-pay

## 2016-02-19 ENCOUNTER — Encounter (HOSPITAL_COMMUNITY): Payer: Self-pay | Admitting: Oncology

## 2016-02-19 DIAGNOSIS — Z79899 Other long term (current) drug therapy: Secondary | ICD-10-CM | POA: Insufficient documentation

## 2016-02-19 DIAGNOSIS — Z711 Person with feared health complaint in whom no diagnosis is made: Secondary | ICD-10-CM

## 2016-02-19 DIAGNOSIS — Z87891 Personal history of nicotine dependence: Secondary | ICD-10-CM | POA: Insufficient documentation

## 2016-02-19 DIAGNOSIS — F3163 Bipolar disorder, current episode mixed, severe, without psychotic features: Secondary | ICD-10-CM | POA: Insufficient documentation

## 2016-02-19 DIAGNOSIS — F314 Bipolar disorder, current episode depressed, severe, without psychotic features: Secondary | ICD-10-CM | POA: Insufficient documentation

## 2016-02-19 DIAGNOSIS — Z791 Long term (current) use of non-steroidal anti-inflammatories (NSAID): Secondary | ICD-10-CM | POA: Insufficient documentation

## 2016-02-19 DIAGNOSIS — F3130 Bipolar disorder, current episode depressed, mild or moderate severity, unspecified: Secondary | ICD-10-CM | POA: Diagnosis present

## 2016-02-19 LAB — URINALYSIS, ROUTINE W REFLEX MICROSCOPIC
Bilirubin Urine: NEGATIVE
GLUCOSE, UA: NEGATIVE mg/dL
Hgb urine dipstick: NEGATIVE
KETONES UR: NEGATIVE mg/dL
LEUKOCYTES UA: NEGATIVE
Nitrite: NEGATIVE
PH: 6.5 (ref 5.0–8.0)
Protein, ur: NEGATIVE mg/dL
Specific Gravity, Urine: 1.01 (ref 1.005–1.030)

## 2016-02-19 LAB — ACETAMINOPHEN LEVEL

## 2016-02-19 LAB — COMPREHENSIVE METABOLIC PANEL
ALBUMIN: 4.7 g/dL (ref 3.5–5.0)
ALK PHOS: 48 U/L (ref 38–126)
ALT: 19 U/L (ref 14–54)
ANION GAP: 8 (ref 5–15)
AST: 24 U/L (ref 15–41)
BILIRUBIN TOTAL: 0.9 mg/dL (ref 0.3–1.2)
BUN: 11 mg/dL (ref 6–20)
CALCIUM: 9.6 mg/dL (ref 8.9–10.3)
CO2: 23 mmol/L (ref 22–32)
CREATININE: 0.71 mg/dL (ref 0.44–1.00)
Chloride: 107 mmol/L (ref 101–111)
GFR calc Af Amer: >60 mL/min (ref >60)
GFR calc non Af Amer: >60 mL/min (ref >60)
GLUCOSE: 101 mg/dL — AB (ref 65–99)
Potassium: 3.9 mmol/L (ref 3.5–5.1)
Sodium: 138 mmol/L (ref 135–145)
TOTAL PROTEIN: 7.9 g/dL (ref 6.5–8.1)

## 2016-02-19 LAB — CBC WITH DIFFERENTIAL/PLATELET
BASOS PCT: 1 %
Basophils Absolute: 0 10*3/uL (ref 0.0–0.1)
EOS ABS: 0.2 10*3/uL (ref 0.0–0.7)
EOS PCT: 3 %
HCT: 39.6 % (ref 36.0–46.0)
Hemoglobin: 13.6 g/dL (ref 12.0–15.0)
Lymphocytes Relative: 28 %
Lymphs Abs: 2.3 10*3/uL (ref 0.7–4.0)
MCH: 30 pg (ref 26.0–34.0)
MCHC: 34.3 g/dL (ref 30.0–36.0)
MCV: 87.4 fL (ref 78.0–100.0)
MONO ABS: 1 10*3/uL (ref 0.1–1.0)
MONOS PCT: 13 %
Neutro Abs: 4.6 10*3/uL (ref 1.7–7.7)
Neutrophils Relative %: 55 %
Platelets: 290 10*3/uL (ref 150–400)
RBC: 4.53 MIL/uL (ref 3.87–5.11)
RDW: 13.1 % (ref 11.5–15.5)
WBC: 8.2 10*3/uL (ref 4.0–10.5)

## 2016-02-19 LAB — RAPID URINE DRUG SCREEN, HOSP PERFORMED
Amphetamines: NOT DETECTED
Barbiturates: NOT DETECTED
Benzodiazepines: NOT DETECTED
COCAINE: NOT DETECTED
OPIATES: NOT DETECTED
Tetrahydrocannabinol: NOT DETECTED

## 2016-02-19 LAB — SALICYLATE LEVEL: Salicylate Lvl: <7 mg/dL (ref 2.8–30.0)

## 2016-02-19 LAB — POC URINE PREG, ED: Preg Test, Ur: NEGATIVE

## 2016-02-19 LAB — ETHANOL: Alcohol, Ethyl (B): <5 mg/dL (ref <5)

## 2016-02-19 MED ORDER — IBUPROFEN 200 MG PO TABS: 600.0000 mg | ORAL_TABLET | Freq: Three times a day (TID) | ORAL | Status: DC | PRN

## 2016-02-19 MED ORDER — OXCARBAZEPINE 300 MG PO TABS
300.0000 mg | ORAL_TABLET | Freq: Two times a day (BID) | ORAL | Status: DC
  Administered 2016-02-20: 300 mg via ORAL
  Filled 2016-02-19 (×2): qty 1

## 2016-02-19 MED ORDER — ZIPRASIDONE HCL 20 MG PO CAPS: 20.0000 mg | ORAL_CAPSULE | Freq: Every day | ORAL | Status: DC

## 2016-02-19 MED ORDER — ALUM & MAG HYDROXIDE-SIMETH 200-200-20 MG/5ML PO SUSP: 30.0000 mL | ORAL | Status: DC | PRN

## 2016-02-19 MED ORDER — ACETAMINOPHEN 325 MG PO TABS: 650.0000 mg | ORAL_TABLET | ORAL | Status: DC | PRN

## 2016-02-19 MED ORDER — ZIPRASIDONE HCL 20 MG PO CAPS
40.0000 mg | ORAL_CAPSULE | Freq: Every day | ORAL | Status: DC
  Administered 2016-02-20: 40 mg via ORAL
  Filled 2016-02-19: qty 2

## 2016-02-19 MED ORDER — SERTRALINE HCL 50 MG PO TABS
50.0000 mg | ORAL_TABLET | Freq: Every day | ORAL | Status: DC
  Administered 2016-02-19: 50 mg via ORAL
  Filled 2016-02-19: qty 1

## 2016-02-19 MED ORDER — BENZTROPINE MESYLATE 0.5 MG PO TABS
0.5000 mg | ORAL_TABLET | Freq: Two times a day (BID) | ORAL | Status: DC
  Administered 2016-02-20: 0.5 mg via ORAL
  Filled 2016-02-19 (×2): qty 1

## 2016-02-19 MED ORDER — LURASIDONE HCL 40 MG PO TABS
40.0000 mg | ORAL_TABLET | Freq: Every day | ORAL | Status: DC
  Administered 2016-02-19: 40 mg via ORAL
  Filled 2016-02-19: qty 1

## 2016-02-19 MED ORDER — B COMPLEX-C PO TABS
1.0000 | ORAL_TABLET | Freq: Every day | ORAL | Status: DC
  Administered 2016-02-19 – 2016-02-20 (×2): 1 via ORAL
  Filled 2016-02-19 (×2): qty 1

## 2016-02-19 MED ORDER — OMEGA-3-ACID ETHYL ESTERS 1 G PO CAPS
1.0000 g | ORAL_CAPSULE | Freq: Every day | ORAL | Status: DC
  Administered 2016-02-19 – 2016-02-20 (×2): 1 g via ORAL
  Filled 2016-02-19 (×2): qty 1
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
## 2016-02-19 NOTE — ED Triage Notes (Signed)
Pt is here to get a pregnancy test.  States she had unprotected sex and is concerned.  Pt is hard to keep focused and needs repeated redirection to answer questions.  Lists multiple life stressors.

## 2016-02-19 NOTE — ED Provider Notes (Signed)
[facility]-EMERGENCY DEPT Provider Note   CSN: [id number] Arrival date & time: 02/19/16  0559     History   Chief Complaint Chief Complaint  Patient presents with  . Requesting Pregnancy Test    HPI Cheryl Cohen is a 25 y.o. female.  With Bipolar 1 on Depo Provera who was seen this week for psychiatric work up and cleared by TTS and had a negative pregnancy test on 02/16/2016 at 2000 who presents requesting another pregnancy test stating her family has her "all fucked up on Depo."  She states she does not have regular cycles on Depo and has mood swings and this is caused by her thinking she is pregnant. She denied abdominal pain.    The history is provided by the patient.    Past Medical History:  Diagnosis Date  . Anxiety   . Bipolar 1 disorder (HCC)    hospitalized  3 x onset rx in 9th grade   . Depression   . Gonorrhea   . Hx of varicella   . Syphilis   . Twin pregnancy, delivered vaginally, current hospitalization 09/09/2015    Patient Active Problem List   Diagnosis Date Noted  . Bipolar 1 disorder, mixed, moderate (HCC) 02/17/2016  . Fetal demise, greater than 22 weeks, antepartum 09/08/2015  . Depressive disorder, not elsewhere classified 10/06/2013  . Suicidal ideation 10/06/2013  . Psychotropic drug use 01/13/2012  . Bipolar 1 disorder (HCC)   . Sleepiness 01/11/2012  . Fatigue 01/11/2012  . Family history of diabetes mellitus type II 01/11/2012    Past Surgical History:  Procedure Laterality Date  . DILATION AND EVACUATION N/A 09/09/2015   Procedure: DILATATION AND EVACUATION;  Surgeon: Lavina Hammanodd Meisinger, MD;  Location: [facility];  Service: Gynecology;  Laterality: N/A;  . TYMPANOSTOMY TUBE PLACEMENT     asage 2-3 yrs    OB History    Gravida Para Term Preterm AB Living   1 1   1    0   SAB TAB Ectopic Multiple Live Births         1         Home Medications    Prior to Admission medications   Medication Sig Start Date End Date Taking? Authorizing Provider    B Complex-C (B-COMPLEX WITH VITAMIN C) tablet Take 1 tablet by mouth daily.    Historical Provider, MD  clindamycin (CLEOCIN) 300 MG capsule Take 2 capsules (600 mg total) by mouth 3 (three) times daily. Patient not taking: Reported on 02/16/2016 09/12/15   Dorathy KinsmanVirginia Smith, CNM  fluconazole (DIFLUCAN) 150 MG tablet Take 1 tablet (150 mg total) by mouth once. Can take additional dose three days later if symptoms persist Patient not taking: Reported on 02/16/2016 09/12/15   Dorathy KinsmanVirginia Smith, CNM  ibuprofen (ADVIL,MOTRIN) 800 MG tablet Take 1 tablet (800 mg total) by mouth every 8 (eight) hours as needed for moderate pain. Patient not taking: Reported on 02/16/2016 09/09/15   Sherian ReinJody Bovard-Stuckert, MD  omega-3 acid ethyl esters (LOVAZA) 1 g capsule Take 1 g by mouth daily.    Historical Provider, MD  sertraline (ZOLOFT) 50 MG tablet Take 1 tablet by mouth daily. 12/31/15   Historical Provider, MD  sertraline (ZOLOFT) 50 MG tablet Take 1 tablet (50 mg total) by mouth at bedtime. 02/17/16   Kristeen MansFran E Hobson, NP  ziprasidone (GEODON) 20 MG capsule Take 1 capsule (20 mg total) by mouth daily with lunch. 02/17/16   Kristeen MansFran E Hobson, NP    Family History  Family History  Problem Relation Age of Onset  . Diabetes Mother   . Arthritis Mother   . Hypertension Father   . Alcohol abuse Father     Social History Social History  Substance Use Topics  . Smoking status: Former Smoker    Packs/day: 0.20    Types: Cigarettes  . Smokeless tobacco: Never Used     Comment: 1 cigarette a day  . Alcohol use No     Allergies   Amoxicillin   Review of Systems Review of Systems  Cardiovascular: Negative for chest pain, palpitations and leg swelling.  Gastrointestinal: Negative for abdominal pain and vomiting.  Genitourinary: Negative for pelvic pain.  All other systems reviewed and are negative.    Physical Exam Updated Vital Signs BP 132/88 (BP Location: Left Arm)   Pulse (!) 122   Temp 98 F (36.7 C)  (Oral)   Resp 16   SpO2 97%   Physical Exam  Constitutional: She is oriented to person, place, and time. She appears well-developed and well-nourished. No distress.  HENT:  Head: Normocephalic and atraumatic.  Mouth/Throat: No oropharyngeal exudate.  Eyes: Conjunctivae are normal. Pupils are equal, round, and reactive to light.  Neck: Normal range of motion. Neck supple. No JVD present.  Cardiovascular: Normal rate, regular rhythm and intact distal pulses.   Pulmonary/Chest: Effort normal and breath sounds normal. No stridor. No respiratory distress. She has no wheezes.  Abdominal: Soft. Bowel sounds are normal. She exhibits no mass. There is no tenderness. There is no rebound and no guarding.  Musculoskeletal: Normal range of motion.  Neurological: She is alert and oriented to person, place, and time.  Skin: Skin is warm. Capillary refill takes less than 2 seconds.  Psychiatric:  argumentative     ED Treatments / Results   Results for orders placed or performed during the hospital encounter of 02/16/16  Comprehensive metabolic panel  Result Value Ref Range   Sodium 138 135 - 145 mmol/L   Potassium 3.6 3.5 - 5.1 mmol/L   Chloride 105 101 - 111 mmol/L   CO2 24 22 - 32 mmol/L   Glucose, Bld 90 65 - 99 mg/dL   BUN 11 6 - 20 mg/dL   Creatinine, Ser 1.61 0.44 - 1.00 mg/dL   Calcium 9.4 8.9 - 09.6 mg/dL   Total Protein 8.0 6.5 - 8.1 g/dL   Albumin 4.6 3.5 - 5.0 g/dL   AST 23 15 - 41 U/L   ALT 18 14 - 54 U/L   Alkaline Phosphatase 50 38 - 126 U/L   Total Bilirubin 0.7 0.3 - 1.2 mg/dL   GFR calc non Af Amer >60 >60 mL/min   GFR calc Af Amer >60 >60 mL/min   Anion gap 9 5 - 15  Ethanol  Result Value Ref Range   Alcohol, Ethyl (B) <5 <5 mg/dL  Urine rapid drug screen (hosp performed)  Result Value Ref Range   Opiates NONE DETECTED NONE DETECTED   Cocaine NONE DETECTED NONE DETECTED   Benzodiazepines NONE DETECTED NONE DETECTED   Amphetamines NONE DETECTED NONE DETECTED    Tetrahydrocannabinol NONE DETECTED NONE DETECTED   Barbiturates NONE DETECTED NONE DETECTED  Pregnancy, urine  Result Value Ref Range   Preg Test, Ur NEGATIVE NEGATIVE  CBC with Differential  Result Value Ref Range   WBC 8.9 4.0 - 10.5 K/uL   RBC 4.60 3.87 - 5.11 MIL/uL   Hemoglobin 14.0 12.0 - 15.0 g/dL   HCT 04.5 40.9 -  46.0 %   MCV 86.1 78.0 - 100.0 fL   MCH 30.4 26.0 - 34.0 pg   MCHC 35.4 30.0 - 36.0 g/dL   RDW 16.112.7 09.611.5 - 04.515.5 %   Platelets 275 150 - 400 K/uL   Neutrophils Relative % 58 %   Neutro Abs 5.1 1.7 - 7.7 K/uL   Lymphocytes Relative 30 %   Lymphs Abs 2.7 0.7 - 4.0 K/uL   Monocytes Relative 10 %   Monocytes Absolute 0.9 0.1 - 1.0 K/uL   Eosinophils Relative 2 %   Eosinophils Absolute 0.2 0.0 - 0.7 K/uL   Basophils Relative 0 %   Basophils Absolute 0.0 0.0 - 0.1 K/uL   No results found.  Procedures Procedures (including critical care time)   Final Clinical Impressions(s) / ED Diagnoses   Final diagnoses:  Physically well but worried  Given the normal pregnancy test within the past 2.5 days and no pain etc on a contraceptive a repeat pregnancy test is not indicated.  MSE is completed and when patient was told she would not be receiving another test in the ED and that a complete exam was performed she left.  Follow up with your PMD  New Prescriptions New Prescriptions   No medications on file     Saint Hank, MD 02/19/16 [phone]

## 2016-02-19 NOTE — ED Triage Notes (Signed)
Pt reports that she is here to get a pregnancy test. Pt was just seen for the same several hours ago in this ER and discharged by the ER MD. Mom reports that she is here for a psychiatric evaluation. Pt denies SI/HI. Pt does answer questions when asked directly but has a hard time understanding certain things when this RN speaks.

## 2016-02-19 NOTE — BH Assessment (Addendum)
Assessment completed. Consulted with Dr. Jannifer FranklinAkintayo who recommends inpatient treatment.  Informed EDPA and patient of recommendation.   Davina PokeJoVea Delaynee Alred, LCSW Therapeutic Triage Specialist [facility] 02/19/2016 11:18 AM

## 2016-02-19 NOTE — ED Notes (Signed)
Pt states that she does not want to take Geodon at this time because she cannot afford it when she is discharged. This was a problem before. She said that the cost was $1000.00 per month.

## 2016-02-19 NOTE — ED Notes (Signed)
Introduced self to patient/family. Pt oriented to unit expectations.  Assessed pt for:  A) Anxiety &/or agitation: On admission pt is restless and manic, but friendly and redirectable. She has difficulty staying in her room, preferring to pace the halls talking to anyone who will talk with her. She has been especially friendly to a female patient who is close to her age and peer group.   S) Safety: Safety maintained with q-15-minute checks and hourly rounds by staff.   A) ADLs: Pt is able to perform all ADLs.   P) Pick-Up (room cleanliness): Pt's room is free of clutter.  Pt was given a lunch tray and a snack. She is thirsty and was given a pitcher of water. She said that she works, but she wants to go to school to be an Charity fundraiserN. She said that she has a lot of talent: she sings, writes poetry, and draws and colors. Denies SI/HI, AVH. She does not appear to be responding to internal stimuli, but she is manic and potentially engaging in dangerous disruptive behaviors.

## 2016-02-19 NOTE — ED Notes (Signed)
Pt was continuously talking and singing opera, talking to anyone on the unit that she could. Present she is sleeping.

## 2016-02-19 NOTE — ED Notes (Signed)
Bed: WA03 Expected date:  Expected time:  Means of arrival:  Comments: 

## 2016-02-19 NOTE — ED Notes (Signed)
She gives a meandering hx. She is here with her mother and "Godmother". She requests pregnancy test and mentions miscarrying twins this June.

## 2016-02-19 NOTE — ED Notes (Signed)
Patient denies SI, HI and AVH at this time. Plan of care discussed with patient voices no complaints or concerns at this time. Encouragement and support provided and safety maintain. Q 15 min safety checks remain in place and video monitoring.

## 2016-02-20 ENCOUNTER — Encounter (HOSPITAL_COMMUNITY): Payer: Self-pay | Admitting: *Deleted

## 2016-02-20 ENCOUNTER — Inpatient Hospital Stay (HOSPITAL_COMMUNITY)
Admission: AD | Admit: 2016-02-20 | Discharge: 2016-02-23 | DRG: 885 | Disposition: A | Discharge status: Home / Self Care | Payer: Federal, State, Local not specified - Other | Attending: Psychiatry | Admitting: Psychiatry

## 2016-02-20 DIAGNOSIS — Z811 Family history of alcohol abuse and dependence: Secondary | ICD-10-CM

## 2016-02-20 DIAGNOSIS — F3163 Bipolar disorder, current episode mixed, severe, without psychotic features: Secondary | ICD-10-CM | POA: Diagnosis present

## 2016-02-20 DIAGNOSIS — Z87891 Personal history of nicotine dependence: Secondary | ICD-10-CM

## 2016-02-20 DIAGNOSIS — Z8249 Family history of ischemic heart disease and other diseases of the circulatory system: Secondary | ICD-10-CM

## 2016-02-20 DIAGNOSIS — Z9119 Patient's noncompliance with other medical treatment and regimen: Secondary | ICD-10-CM | POA: Diagnosis not present

## 2016-02-20 DIAGNOSIS — Z79899 Other long term (current) drug therapy: Secondary | ICD-10-CM

## 2016-02-20 DIAGNOSIS — Z833 Family history of diabetes mellitus: Secondary | ICD-10-CM

## 2016-02-20 DIAGNOSIS — F319 Bipolar disorder, unspecified: Secondary | ICD-10-CM | POA: Diagnosis present

## 2016-02-20 DIAGNOSIS — F3161 Bipolar disorder, current episode mixed, mild: Secondary | ICD-10-CM | POA: Diagnosis present

## 2016-02-20 DIAGNOSIS — Z8261 Family history of arthritis: Secondary | ICD-10-CM | POA: Diagnosis not present

## 2016-02-20 DIAGNOSIS — Z888 Allergy status to other drugs, medicaments and biological substances status: Secondary | ICD-10-CM

## 2016-02-20 DIAGNOSIS — F3130 Bipolar disorder, current episode depressed, mild or moderate severity, unspecified: Secondary | ICD-10-CM | POA: Diagnosis present

## 2016-02-20 MED ORDER — BENZTROPINE MESYLATE 0.5 MG PO TABS
0.5000 mg | ORAL_TABLET | Freq: Two times a day (BID) | ORAL | Status: DC
  Administered 2016-02-20 – 2016-02-21 (×2): 0.5 mg via ORAL
  Filled 2016-02-20 (×6): qty 1

## 2016-02-20 MED ORDER — OMEGA-3-ACID ETHYL ESTERS 1 G PO CAPS
1.0000 g | ORAL_CAPSULE | Freq: Every day | ORAL | Status: DC
Start: 2016-02-21 — End: 2016-02-23
  Administered 2016-02-21 – 2016-02-23 (×3): 1 g via ORAL
  Filled 2016-02-20 (×6): qty 1

## 2016-02-20 MED ORDER — INFLUENZA VAC SPLIT QUAD 0.5 ML IM SUSY
0.5000 mL | PREFILLED_SYRINGE | INTRAMUSCULAR | Status: AC
  Administered 2016-02-21: 0.5 mL via INTRAMUSCULAR
  Filled 2016-02-20: qty 0.5

## 2016-02-20 MED ORDER — IBUPROFEN 600 MG PO TABS: 600.0000 mg | ORAL_TABLET | Freq: Three times a day (TID) | ORAL | Status: DC | PRN

## 2016-02-20 MED ORDER — ZIPRASIDONE HCL 40 MG PO CAPS
40.0000 mg | ORAL_CAPSULE | Freq: Every day | ORAL | Status: DC
  Filled 2016-02-20 (×2): qty 1

## 2016-02-20 MED ORDER — ALUM & MAG HYDROXIDE-SIMETH 200-200-20 MG/5ML PO SUSP: 30.0000 mL | ORAL | Status: DC | PRN

## 2016-02-20 MED ORDER — OXCARBAZEPINE 300 MG PO TABS
300.0000 mg | ORAL_TABLET | Freq: Two times a day (BID) | ORAL | Status: DC
  Administered 2016-02-20 – 2016-02-21 (×2): 300 mg via ORAL
  Filled 2016-02-20 (×5): qty 1

## 2016-02-20 MED ORDER — B COMPLEX-C PO TABS
1.0000 | ORAL_TABLET | Freq: Every day | ORAL | Status: DC
  Administered 2016-02-21 – 2016-02-23 (×3): 1 via ORAL
  Filled 2016-02-20: qty 1
  Filled 2016-02-20: qty 7
  Filled 2016-02-20 (×3): qty 1

## 2016-02-20 MED ORDER — ACETAMINOPHEN 325 MG PO TABS: 650.0000 mg | ORAL_TABLET | ORAL | Status: DC | PRN

## 2016-02-20 MED ORDER — MAGNESIUM HYDROXIDE 400 MG/5ML PO SUSP: 30.0000 mL | Freq: Every day | ORAL | Status: DC | PRN

## 2016-02-20 MED ORDER — SERTRALINE HCL 50 MG PO TABS
50.0000 mg | ORAL_TABLET | Freq: Every day | ORAL | Status: DC
  Administered 2016-02-20: 50 mg via ORAL
  Filled 2016-02-20 (×3): qty 1

## 2016-02-20 NOTE — ED Notes (Signed)
Pt took her medications after being encouraged by Dr. Jannifer FranklinAkintayo.

## 2016-02-20 NOTE — Tx Team (Signed)
Initial Treatment Plan 02/20/2016 7:56 PM Scotti Randel PiggBrianna Hackel [id number]    PATIENT STRESSORS: Loss of twin babies (D & C--Still born in 6/17) Traumatic event   PATIENT STRENGTHS: Ability for insight Capable of independent living Communication skills Motivation for treatment/growth Physical Health Supportive family/friends Work skills   PATIENT IDENTIFIED PROBLEMS: "I Just want to be able to be myself again, happy" Alteration in mood (Depression & anxiety)  Ineffective coping skills (grief--loss of twin babies)  Alteration in thought process (poor concentration, tangential)                 DISCHARGE CRITERIA:  Improved stabilization in mood, thinking, and/or behavior Motivation to continue treatment in a less acute level of care Verbal commitment to aftercare and medication compliance  PRELIMINARY DISCHARGE PLAN: Outpatient therapy Return to previous living arrangement Return to previous work or school arrangements  PATIENT/FAMILY INVOLVEMENT: This treatment plan has been presented to and reviewed with the patient, Aleda GranaMiya [name] Spangle. The patient have been given the opportunity to ask questions and make suggestions.  Sherryl MangesWesseh, Cathleen Yagi, RN 02/20/2016, 7:56 PM

## 2016-02-20 NOTE — ED Notes (Signed)
Pt up out of bed again, but not has talkative as before. She likes to hang around the front of the unit where other people congregate, and/or use the telephone.

## 2016-02-20 NOTE — Consult Note (Signed)
[facility] Consult   Reason for Consult:  Mania  Referring Physician:  EDP Patient Identification: Cheryl Cohen MRN:  [id number] Principal Diagnosis: Bipolar disord, crnt epsd mixed, severe, w/o psych features [facility]) Diagnosis:   Patient Active Problem List   Diagnosis Date Noted  . Bipolar disord, crnt epsd mixed, severe, w/o psych features ([place]) [F31.63] 02/19/2016    Priority: High  . Fetal demise, greater than 22 weeks, antepartum [O36.4XX0] 09/08/2015  . Suicidal ideation [R45.851] 10/06/2013  . Family history of diabetes mellitus type II [Z83.3] 01/11/2012    Total Time spent with patient: 45 minutes  Subjective:   Cheryl Cohen is a 25 y.o. female patient admitted with mania.  HPI:  On admission to the ED:  25 y.o. female presenting to [facility] voluntarily requesting a pregnancy test and psychiatric evaluation. Patient states that she came in for a pregnancy test three days ago and then again this morning. Patient states that she thinks that she is pregnant and feels that the medical system has not been acknowledging her pregnancy. Patient states that she would like additional tests to be ran to determine pregnancy. Patient states that she was pregnant with twins that were due in September and the twins were still born in June. Patient states that losing her children has been her main stressor over the past few months. Patient states that she has been depressed and endorses symptoms of depression as; tearfulness and fatigue. Patient states that she has been sleeping less but is tangential when discussing her sleep patterns.  Patient denies suicidal ideations and homicidal ideations. Patient denies auditory and visual hallucinations. Patient states that she has been paranoid lately about being pregnant. Patient denies history of aggression. Patient denies pending charges and upcoming court dates. Patient denies active probation. Patient denies access to firearms and  weapons.  Patient denies use of drugs and alcohol. Patient UDS and BAL both clear at time of assessment.   Patient is alert and oriented x4. Patient is cooperative with the assessment. Patients speech is pressured and she is tangential at times. Patient states that she is having racing thoughts. Patients mother states that the patient has been "up all night talking about pregnancy" and she "ran in the street earlier" (which patient denies). Patients mood is labile and affect is congruent. Patient states that she has had previous admissions to behavioral health with the last admission being in 2015. Patient states that she has a psychiatrist and a therapist at [facility]. Patient states that she has been prescribed Latuda and Zoloft. Patient states that her mother is supportive. Patient states that she has not had any support from the person which was the father of the twins which has also been stressful.   Patients mother provided collateral information and states that the patient has not been sleeping, has had rapid pressured speech, and has shown labile moods.  She states that patient is focused on being pregnancy and will not accept not being pregnant at this time.   Today, she continues to be manic with pressured speech, poor concentration, delusions of being pregnant, tangential.  Denies suicidal/homicidal ideations and alcohol/drug use.  Stabilization needed.  Past Psychiatric History: bipolar disorder  Risk to Self: Suicidal Ideation: No Suicidal Intent: No Is patient at risk for suicide?: No Suicidal Plan?: No Access to Means: No What has been your use of drugs/alcohol within the last 12 months?: Denies How many times?: 0 Other Self Harm Risks: Denies currently  Triggers  for Past Attempts: None known Intentional Self Injurious Behavior: None Risk to Others: Homicidal Ideation: No Thoughts of Harm to Others: No Comment - Thoughts of Harm to Others: Denies Current  Homicidal Intent: No Current Homicidal Plan: No Access to Homicidal Means: No Identified Victim: Denies History of harm to others?: No Assessment of Violence: None Noted Violent Behavior Description: Denies Does patient have access to weapons?: No Criminal Charges Pending?: No Does patient have a court date: No Prior Inpatient Therapy: Prior Inpatient Therapy: Yes Prior Therapy Dates: 2012, 2013 Prior Therapy Facilty/Provider(s): [facility], [facility] Reason for Treatment: Bipolar Prior Outpatient Therapy: Prior Outpatient Therapy: Yes Prior Therapy Dates: Current  Prior Therapy Facilty/Provider(s): [facility] Reason for Treatment: Medication Management Does patient have an ACCT team?: No Does patient have Intensive In-House Services?  : No Does patient have [facility] services? : No Does patient have P4CC services?: No  Past Medical History:  Past Medical History:  Diagnosis Date  . Anxiety   . Bipolar 1 disorder ([place])    hospitalized  3 x onset rx in 9th grade   . Depression   . Gonorrhea   . Hx of varicella   . Syphilis   . Twin pregnancy, delivered vaginally, current hospitalization 09/09/2015    Past Surgical History:  Procedure Laterality Date  . DILATION AND EVACUATION N/A 09/09/2015   Procedure: DILATATION AND EVACUATION;  Surgeon: Cheri Fowler, MD;  Location: [facility];  Service: Gynecology;  Laterality: N/A;  . TYMPANOSTOMY TUBE PLACEMENT     asage 2-3 yrs   Family History:  Family History  Problem Relation Age of Onset  . Diabetes Mother   . Arthritis Mother   . Hypertension Father   . Alcohol abuse Father    Family Psychiatric  History: none Social History:  History  Alcohol Use No     History  Drug Use No    Social History   Social History  . Marital status: Single    Spouse name: N/A  . Number of children: N/A  . Years of education: N/A   Social History Main Topics  . Smoking status: Former Smoker    Packs/day: 0.20    Types:  Cigarettes  . Smokeless tobacco: Never Used     Comment: 1 cigarette a day  . Alcohol use No  . Drug use: No  . Sexual activity: No   Other Topics Concern  . None   Social History Narrative   hh of 4    Lives at home  [place] to [place] for [place] second year    Working also Engineer, water   Pet dog   Neg tad some caffiene   Some exercise in class    Additional Social History:    Allergies:   Allergies  Allergen Reactions  . Amoxicillin Rash    Has patient had a PCN reaction causing immediate rash, facial/tongue/throat swelling, SOB or lightheadedness with hypotension: Yes Has patient had a PCN reaction causing severe rash involving mucus membranes or skin necrosis: No Has patient had a PCN reaction that required hospitalization: No Has patient had a PCN reaction occurring within the last 10 years: Yes If all of the above answers are "NO", then may proceed with Cephalosporin use.     Labs:  Results for orders placed or performed during the hospital encounter of 02/19/16 (from the past 48 hour(s))  Urinalysis, Routine w reflex microscopic     Status: None   Collection Time: 02/19/16 10:24 AM  Result Value Ref Range   Color, Urine YELLOW YELLOW   APPearance CLEAR CLEAR   Specific Gravity, Urine 1.010 1.005 - 1.030   pH 6.5 5.0 - 8.0   Glucose, UA NEGATIVE NEGATIVE mg/dL   Hgb urine dipstick NEGATIVE NEGATIVE   Bilirubin Urine NEGATIVE NEGATIVE   Ketones, ur NEGATIVE NEGATIVE mg/dL   Protein, ur NEGATIVE NEGATIVE mg/dL   Nitrite NEGATIVE NEGATIVE   Leukocytes, UA NEGATIVE NEGATIVE    Comment: MICROSCOPIC NOT DONE ON URINES WITH NEGATIVE PROTEIN, BLOOD, LEUKOCYTES, NITRITE, OR GLUCOSE <1000 mg/dL.  Urine rapid drug screen (hosp performed)not at [facility]     Status: None   Collection Time: 02/19/16 10:24 AM  Result Value Ref Range   Opiates NONE DETECTED NONE DETECTED   Cocaine NONE DETECTED NONE DETECTED   Benzodiazepines NONE DETECTED NONE DETECTED   Amphetamines NONE  DETECTED NONE DETECTED   Tetrahydrocannabinol NONE DETECTED NONE DETECTED   Barbiturates NONE DETECTED NONE DETECTED    Comment:        DRUG SCREEN FOR MEDICAL PURPOSES ONLY.  IF CONFIRMATION IS NEEDED FOR ANY PURPOSE, NOTIFY LAB WITHIN 5 DAYS.        LOWEST DETECTABLE LIMITS FOR URINE DRUG SCREEN Drug Class       Cutoff (ng/mL) Amphetamine      1000 Barbiturate      200 Benzodiazepine   786 Tricyclics       767 Opiates          300 Cocaine          300 THC              50   POC urine preg, ED     Status: None   Collection Time: 02/19/16 10:29 AM  Result Value Ref Range   Preg Test, Ur NEGATIVE NEGATIVE    Comment:        THE SENSITIVITY OF THIS METHODOLOGY IS >24 mIU/mL   Comprehensive metabolic panel     Status: Abnormal   Collection Time: 02/19/16 10:50 AM  Result Value Ref Range   Sodium 138 135 - 145 mmol/L   Potassium 3.9 3.5 - 5.1 mmol/L   Chloride 107 101 - 111 mmol/L   CO2 23 22 - 32 mmol/L   Glucose, Bld 101 (H) 65 - 99 mg/dL   BUN 11 6 - 20 mg/dL   Creatinine, Ser 0.71 0.44 - 1.00 mg/dL   Calcium 9.6 8.9 - 10.3 mg/dL   Total Protein 7.9 6.5 - 8.1 g/dL   Albumin 4.7 3.5 - 5.0 g/dL   AST 24 15 - 41 U/L   ALT 19 14 - 54 U/L   Alkaline Phosphatase 48 38 - 126 U/L   Total Bilirubin 0.9 0.3 - 1.2 mg/dL   GFR calc non Af Amer >60 >60 mL/min   GFR calc Af Amer >60 >60 mL/min    Comment: (NOTE) The eGFR has been calculated using the CKD EPI equation. This calculation has not been validated in all clinical situations. eGFR's persistently <60 mL/min signify possible Chronic Kidney Disease.    Anion gap 8 5 - 15  Ethanol     Status: None   Collection Time: 02/19/16 10:50 AM  Result Value Ref Range   Alcohol, Ethyl (B) <5 <5 mg/dL    Comment:        LOWEST DETECTABLE LIMIT FOR SERUM ALCOHOL IS 5 mg/dL FOR MEDICAL PURPOSES ONLY   CBC with Diff     Status: None  Collection Time: 02/19/16 10:50 AM  Result Value Ref Range   WBC 8.2 4.0 - 10.5 K/uL   RBC  4.53 3.87 - 5.11 MIL/uL   Hemoglobin 13.6 12.0 - 15.0 g/dL   HCT 55.2 53.6 - 48.3 %   MCV 87.4 78.0 - 100.0 fL   MCH 30.0 26.0 - 34.0 pg   MCHC 34.3 30.0 - 36.0 g/dL   RDW 89.3 06.8 - 40.5 %   Platelets 290 150 - 400 K/uL   Neutrophils Relative % 55 %   Neutro Abs 4.6 1.7 - 7.7 K/uL   Lymphocytes Relative 28 %   Lymphs Abs 2.3 0.7 - 4.0 K/uL   Monocytes Relative 13 %   Monocytes Absolute 1.0 0.1 - 1.0 K/uL   Eosinophils Relative 3 %   Eosinophils Absolute 0.2 0.0 - 0.7 K/uL   Basophils Relative 1 %   Basophils Absolute 0.0 0.0 - 0.1 K/uL  Salicylate level     Status: None   Collection Time: 02/19/16 10:50 AM  Result Value Ref Range   Salicylate Lvl <7.0 2.8 - 30.0 mg/dL  Acetaminophen level     Status: Abnormal   Collection Time: 02/19/16 10:50 AM  Result Value Ref Range   Acetaminophen (Tylenol), Serum <10 (L) 10 - 30 ug/mL    Comment:        THERAPEUTIC CONCENTRATIONS VARY SIGNIFICANTLY. A RANGE OF 10-30 ug/mL MAY BE AN EFFECTIVE CONCENTRATION FOR MANY PATIENTS. HOWEVER, SOME ARE BEST TREATED AT CONCENTRATIONS OUTSIDE THIS RANGE. ACETAMINOPHEN CONCENTRATIONS >150 ug/mL AT 4 HOURS AFTER INGESTION AND >50 ug/mL AT 12 HOURS AFTER INGESTION ARE OFTEN ASSOCIATED WITH TOXIC REACTIONS.     Current Facility-Administered Medications  Medication Dose Route Frequency Provider Last Rate Last Dose  . acetaminophen (TYLENOL) tablet 650 mg  650 mg Oral Q4H PRN Rise Mu, PA-C      . alum & mag hydroxide-simeth (MAALOX/MYLANTA) 200-200-20 MG/5ML suspension 30 mL  30 mL Oral PRN Rise Mu, PA-C      . B-complex with vitamin C tablet 1 tablet  1 tablet Oral Daily Rise Mu, PA-C   1 tablet at 02/20/16 1016  . benztropine (COGENTIN) tablet 0.5 mg  0.5 mg Oral BID Thedore Mins, MD   0.5 mg at 02/20/16 1016  . ibuprofen (ADVIL,MOTRIN) tablet 600 mg  600 mg Oral Q8H PRN Rise Mu, PA-C      . omega-3 acid ethyl esters (LOVAZA) capsule 1 g  1 g Oral  Daily Rise Mu, PA-C   1 g at 02/20/16 1016  . Oxcarbazepine (TRILEPTAL) tablet 300 mg  300 mg Oral BID Thedore Mins, MD   300 mg at 02/20/16 1016  . sertraline (ZOLOFT) tablet 50 mg  50 mg Oral QHS Rise Mu, PA-C   50 mg at 02/19/16 2142  . ziprasidone (GEODON) capsule 40 mg  40 mg Oral QPC supper Thedore Mins, MD       Current Outpatient Prescriptions  Medication Sig Dispense Refill  . B Complex-C (B-COMPLEX WITH VITAMIN C) tablet Take 1 tablet by mouth daily.    Marland Kitchen ibuprofen (ADVIL,MOTRIN) 800 MG tablet Take 1 tablet (800 mg total) by mouth every 8 (eight) hours as needed for moderate pain. 45 tablet 1  . lurasidone (LATUDA) 40 MG TABS tablet Take 40 mg by mouth daily.    Marland Kitchen omega-3 acid ethyl esters (LOVAZA) 1 g capsule Take 1 g by mouth daily.    . sertraline (ZOLOFT) 50 MG tablet  Take 1 tablet (50 mg total) by mouth at bedtime. 30 tablet 0  . ziprasidone (GEODON) 20 MG capsule Take 1 capsule (20 mg total) by mouth daily with lunch. (Patient not taking: Reported on 02/19/2016) 30 capsule 0    Musculoskeletal: Strength & Muscle Tone: within normal limits Gait & Station: normal Patient leans: N/A  Psychiatric Specialty Exam: Physical Exam  Constitutional: She is oriented to person, place, and time. She appears well-developed and well-nourished.  HENT:  Head: Normocephalic.  Neck: Normal range of motion.  Respiratory: Effort normal.  Musculoskeletal: Normal range of motion.  Neurological: She is alert and oriented to person, place, and time.  Psychiatric: Her mood appears anxious. Her affect is labile. Her speech is rapid and/or pressured. She is hyperactive. Thought content is delusional. Cognition and memory are impaired. She expresses impulsivity and inappropriate judgment. She is inattentive.    Review of Systems  Constitutional: Negative.   HENT: Negative.   Eyes: Negative.   Respiratory: Negative.   Cardiovascular: Negative.   Gastrointestinal:  Negative.   Genitourinary: Negative.   Musculoskeletal: Negative.   Skin: Negative.   Neurological: Negative.   Endo/Heme/Allergies: Negative.   Psychiatric/Behavioral: The patient is nervous/anxious and has insomnia.     Blood pressure 114/76, pulse 117, temperature 99 F (37.2 C), temperature source Oral, resp. rate 17, SpO2 98 %.There is no height or weight on file to calculate BMI.  General Appearance: Casual  Eye Contact:  Fair  Speech:  Pressured  Volume:  Normal  Mood:  Anxious and Euphoric  Affect:  Labile  Thought Process:  Coherent and Descriptions of Associations: Tangential  Orientation:  Full (Time, Place, and Person)  Thought Content:  Delusions and Tangential  Suicidal Thoughts:  No  Homicidal Thoughts:  No  Memory:  Immediate;   Poor Recent;   Poor Remote;   Fair  Judgement:  Impaired  Insight:  Lacking  Psychomotor Activity:  Increased  Concentration:  Concentration: Poor and Attention Span: Poor  Recall:  [employer] of Knowledge:  Fair  Language:  Good  Akathisia:  No  Handed:  Right  AIMS (if indicated):     Assets:  Housing Leisure Time Physical Health Resilience Social Support  ADL's:  Intact  Cognition:  Impaired,  Mild  Sleep:        Treatment Plan Summary: Daily contact with patient to assess and evaluate symptoms and progress in treatment and Medication management:   Bipolar affective disorder, mania, severe with psychosis: -Crisis stabilization -Medication management:  Continued Zoloft 50 mg daily for depression, increase Geodon 20 mg to 40 mg daily for psychosis.  Start Cogentin 0.5 mg BID for EPS and Trileptal 300 mg BID for mood stabilization, discontinue Latuda 40 mg daily for psychosis.   -Individual counseling  Disposition: Recommend psychiatric Inpatient admission when medically cleared.  Waylan Boga, NP 02/20/2016 11:55 AM  Patient seen face-to-face for psychiatric evaluation, chart reviewed and case discussed with the  physician extender and developed treatment plan. Reviewed the information documented and agree with the treatment plan. Corena Pilgrim, MD

## 2016-02-20 NOTE — ED Notes (Signed)
  Assessed pt for:  A) Anxiety &/or agitation: Pt is agitated and manic. She said that it is "Just ADHD."  She is very selective about what medications she will take.   S) Safety: Safety maintained with q-15-minute checks and hourly rounds by staff.   A) ADLs: Pt able to complete ADLs without assist.  P) Pick-Up (room cleanliness): Pt's room free from clutter.  Pt has been manic this morning. Night shift said that she did not sleep much, that she was up and down all night.  She engages with other patients constantly, and has told one female pt that she is pregnant (which she is not). Her room was moved because she was across the hall from a female patient who is also manic in order to reduce stimulus. She is overall cooperative, but only takes her medications selectively. She said that she wants to go home, and suggestion that she take the medications prescribed for her to help her achieve that goal was met with, "Well that is coercion".

## 2016-02-20 NOTE — ED Provider Notes (Signed)
25 year old female who presented with requesting pregnancy test and psychiatric evaluation. Patient was evaluated by Wilburn MylarKenneth Tyler Leaphart PA-C and TTS was consulted recommending inpt treatment.  Patient is hemodynamically stable, with normal labs, on my evaluation is ambulatory in [facility], with no acute medical concerns.  She is medically cleared.   Alvira MondayErin Kathalene Sporer, MD 02/20/16 [phone]

## 2016-02-20 NOTE — ED Notes (Signed)
Pt continues to assert that she is pregnant, and when told that her urine test was negative, she thinks that she is being manipulated, or that the result was incorrect.

## 2016-02-20 NOTE — ED Notes (Signed)
Pt requested and was given 3 extra blankets and for here television to be turned off. She is sleepy.

## 2016-02-20 NOTE — Progress Notes (Signed)
Pt did not attend wrap up group this evening.  
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
## 2016-02-20 NOTE — ED Provider Notes (Signed)
[facility] but refuses to sign voluntary paperwork to go.  Evaluated patient and reviewed ED course in computer. Since miscarriage of twin gestation in the summer, family as noted she has increased promiscuity and manic behavior, not eating or sleeping. She has flight of ideas with tangential speech when is speak with her. Does not demonstrate insight into why she is here in the [facility] or need for treatment.  IVC paperwork placed.   Lavera Guiseana Duo Jacquie Lukes, MD 02/20/16 [phone]
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
## 2016-02-20 NOTE — Progress Notes (Signed)
CSW faxed patient's IVC and first examination paperwork to magistrate office. CSW contacted magistrate office to confirm receipt of paperwork. Magistrate staff reported that paperwork was received. CSW informed staff that patient required service and transportation. CSW logged patient's IVC and first examination paperwork into IVC log book.

## 2016-02-20 NOTE — BH Assessment (Signed)
Faxed information to the following facilities for placement:  [facility]   [address] Ellis Patsy BaltimoreWarrick Jr, [facility], [facility], [facility] Triage Specialist [phone]

## 2016-02-20 NOTE — ED Notes (Signed)
Pt's status changed to IVC because she would not sign voluntary consent and because of elopement risk. [facility]. There were not belongings because her mother took them home which is indicated on the chart.
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
## 2016-02-21 ENCOUNTER — Encounter (HOSPITAL_COMMUNITY): Payer: Self-pay | Admitting: Psychiatry

## 2016-02-21 DIAGNOSIS — Z888 Allergy status to other drugs, medicaments and biological substances status: Secondary | ICD-10-CM

## 2016-02-21 DIAGNOSIS — Z8261 Family history of arthritis: Secondary | ICD-10-CM

## 2016-02-21 DIAGNOSIS — Z833 Family history of diabetes mellitus: Secondary | ICD-10-CM

## 2016-02-21 DIAGNOSIS — Z79899 Other long term (current) drug therapy: Secondary | ICD-10-CM

## 2016-02-21 DIAGNOSIS — Z811 Family history of alcohol abuse and dependence: Secondary | ICD-10-CM

## 2016-02-21 DIAGNOSIS — F3163 Bipolar disorder, current episode mixed, severe, without psychotic features: Principal | ICD-10-CM

## 2016-02-21 DIAGNOSIS — Z8249 Family history of ischemic heart disease and other diseases of the circulatory system: Secondary | ICD-10-CM

## 2016-02-21 DIAGNOSIS — F3161 Bipolar disorder, current episode mixed, mild: Secondary | ICD-10-CM | POA: Diagnosis present

## 2016-02-21 MED ORDER — OXCARBAZEPINE 300 MG PO TABS
300.0000 mg | ORAL_TABLET | Freq: Every day | ORAL | Status: DC
  Administered 2016-02-22 – 2016-02-23 (×2): 300 mg via ORAL
  Filled 2016-02-21 (×2): qty 1
  Filled 2016-02-21: qty 21
  Filled 2016-02-21: qty 1

## 2016-02-21 MED ORDER — SERTRALINE HCL 25 MG PO TABS
25.0000 mg | ORAL_TABLET | Freq: Every day | ORAL | Status: DC
  Administered 2016-02-21 – 2016-02-22 (×2): 25 mg via ORAL
  Filled 2016-02-21 (×3): qty 1
  Filled 2016-02-21: qty 7

## 2016-02-21 MED ORDER — BENZTROPINE MESYLATE 0.5 MG PO TABS
0.5000 mg | ORAL_TABLET | Freq: Every day | ORAL | Status: DC
  Administered 2016-02-21 – 2016-02-22 (×2): 0.5 mg via ORAL
  Filled 2016-02-21 (×3): qty 1
  Filled 2016-02-21: qty 7

## 2016-02-21 MED ORDER — HALOPERIDOL 5 MG PO TABS
5.0000 mg | ORAL_TABLET | Freq: Every day | ORAL | Status: DC
  Administered 2016-02-21: 5 mg via ORAL
  Filled 2016-02-21 (×2): qty 1

## 2016-02-21 MED ORDER — OXCARBAZEPINE 300 MG PO TABS
600.0000 mg | ORAL_TABLET | Freq: Every evening | ORAL | Status: DC
  Administered 2016-02-21 – 2016-02-22 (×2): 600 mg via ORAL
  Filled 2016-02-21 (×4): qty 2
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
## 2016-02-21 NOTE — Progress Notes (Signed)
Did not attend group 
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
## 2016-02-21 NOTE — [facility] Suicide Risk Assessment (Signed)
[facility]   Nursing information obtained from:    Demographic factors:    Current Mental Status:    Loss Factors:    Historical Factors:    Risk Reduction Factors:     Total Time spent with patient: 30 minutes Principal Problem: Bipolar disorder, curr episode mixed, severe, w/o psychotic features (HCC) Diagnosis:   Patient Active Problem List   Diagnosis Date Noted  . Bipolar disorder, curr episode mixed, severe, w/o psychotic features (HCC) [F31.63] 02/21/2016  . Fetal demise, greater than 22 weeks, antepartum [O36.4XX0] 09/08/2015  . Suicidal ideation [R45.851] 10/06/2013  . Family history of diabetes mellitus type II [Z83.3] 01/11/2012   Subjective Data: Please see H&P.   Continued Clinical Symptoms:    The "Alcohol Use Disorders Identification Test", Guidelines for Use in Primary Care, Second Edition.  World Science writerHealth Organization [facility](WHO). Score between 0-7:  no or low risk or alcohol related problems. Score between 8-15:  moderate risk of alcohol related problems. Score between 16-19:  high risk of alcohol related problems. Score 20 or above:  warrants further diagnostic evaluation for alcohol dependence and treatment.   CLINICAL FACTORS:   Bipolar Disorder:   Mixed State   Musculoskeletal: Strength & Muscle Tone: within normal limits Gait & Station: normal Patient leans: N/A  Psychiatric Specialty Exam: Physical Exam  Review of Systems  Psychiatric/Behavioral: The patient is nervous/anxious.   All other systems reviewed and are negative.   Blood pressure 102/72, pulse (!) 144, temperature 98 F (36.7 C), temperature source Oral, resp. rate 18, height 5\' 7"  (1.702 m), weight 57.6 kg (127 lb), SpO2 100 %.Body mass index is 19.89 kg/m.            Please see H&P.                                               COGNITIVE FEATURES THAT CONTRIBUTE TO RISK:  Closed-mindedness, Polarized thinking and Thought  constriction (tunnel vision)    SUICIDE RISK:   Mild:  Suicidal ideation of limited frequency, intensity, duration, and specificity.  There are no identifiable plans, no associated intent, mild dysphoria and related symptoms, good self-control (both objective and subjective assessment), few other risk factors, and identifiable protective factors, including available and accessible social support.   PLAN OF CARE: Please see H&P.   I certify that inpatient services furnished can reasonably be expected to improve the patient's condition.  Turquoise Esch, MD 02/21/2016, 11:30 AM

## 2016-02-21 NOTE — Tx Team (Signed)
Interdisciplinary Treatment and Diagnostic Plan Update  02/21/2016 Time of Session: 1:01 PM  Cheryl Cohen MRN: [id number]  Principal Diagnosis: Bipolar disorder, curr episode mixed, severe, w/o psychotic features ([place])  Secondary Diagnoses: Principal Problem:   Bipolar disorder, curr episode mixed, severe, w/o psychotic features ([place])   Current Medications:  Current Facility-Administered Medications  Medication Dose Route Frequency Provider Last Rate Last Dose  . acetaminophen (TYLENOL) tablet 650 mg  650 mg Oral Q4H PRN Patrecia Pour, NP      . alum & mag hydroxide-simeth (MAALOX/MYLANTA) 200-200-20 MG/5ML suspension 30 mL  30 mL Oral PRN Patrecia Pour, NP      . B-complex with vitamin C tablet 1 tablet  1 tablet Oral Daily Patrecia Pour, NP   1 tablet at 02/21/16 0831  . benztropine (COGENTIN) tablet 0.5 mg  0.5 mg Oral QHS Saramma Eappen, MD      . haloperidol (HALDOL) tablet 5 mg  5 mg Oral QHS Saramma Eappen, MD      . ibuprofen (ADVIL,MOTRIN) tablet 600 mg  600 mg Oral Q8H PRN Patrecia Pour, NP      . magnesium hydroxide (MILK OF MAGNESIA) suspension 30 mL  30 mL Oral Daily PRN Patrecia Pour, NP      . omega-3 acid ethyl esters (LOVAZA) capsule 1 g  1 g Oral Daily Patrecia Pour, NP   1 g at 02/21/16 0831  . [START ON 02/22/2016] Oxcarbazepine (TRILEPTAL) tablet 300 mg  300 mg Oral Daily Saramma Eappen, MD      . Oxcarbazepine (TRILEPTAL) tablet 600 mg  600 mg Oral QPM Saramma Eappen, MD      . sertraline (ZOLOFT) tablet 25 mg  25 mg Oral QHS Ursula Alert, MD        PTA Medications: Prescriptions Prior to Admission  Medication Sig Dispense Refill Last Dose  . B Complex-C (B-COMPLEX WITH VITAMIN C) tablet Take 1 tablet by mouth daily.   02/18/2016 at Unknown time  . ibuprofen (ADVIL,MOTRIN) 800 MG tablet Take 1 tablet (800 mg total) by mouth every 8 (eight) hours as needed for moderate pain. 45 tablet 1 Past Week at Unknown time  . lurasidone (LATUDA) 40 MG TABS  tablet Take 40 mg by mouth daily.   02/18/2016 at Unknown time  . omega-3 acid ethyl esters (LOVAZA) 1 g capsule Take 1 g by mouth daily.   02/18/2016 at Unknown time  . sertraline (ZOLOFT) 50 MG tablet Take 1 tablet (50 mg total) by mouth at bedtime. 30 tablet 0 02/18/2016 at Unknown time  . ziprasidone (GEODON) 20 MG capsule Take 1 capsule (20 mg total) by mouth daily with lunch. (Patient not taking: Reported on 02/19/2016) 30 capsule 0 Not Taking at Unknown time    Treatment Modalities: Medication Management, Group therapy, Case management,  1 to 1 session with clinician, Psychoeducation, Recreational therapy.   Physician Treatment Plan for Primary Diagnosis: Bipolar disorder, curr episode mixed, severe, w/o psychotic features ([place]) Long Term Goal(s): Improvement in symptoms so as ready for discharge  Short Term Goals: Ability to verbalize feelings will improve   Medication Management: Evaluate patient's response, side effects, and tolerance of medication regimen.  Therapeutic Interventions: 1 to 1 sessions, Unit Group sessions and Medication administration.  Evaluation of Outcomes: Progressing  Physician Treatment Plan for Secondary Diagnosis: Principal Problem:   Bipolar disorder, curr episode mixed, severe, w/o psychotic features ([place])   Long Term Goal(s): Improvement in symptoms so as ready for  discharge  Short Term Goals: Compliance with prescribed medications will improve  Medication Management: Evaluate patient's response, side effects, and tolerance of medication regimen.  Therapeutic Interventions: 1 to 1 sessions, Unit Group sessions and Medication administration.  Evaluation of Outcomes: Progressing   RN Treatment Plan for Primary Diagnosis: Bipolar disorder, curr episode mixed, severe, w/o psychotic features ([place]) Long Term Goal(s): Knowledge of disease and therapeutic regimen to maintain health will improve  Short Term Goals: Ability to demonstrate self-control  and Ability to identify and develop effective coping behaviors will improve  Medication Management: RN will administer medications as ordered by provider, will assess and evaluate patient's response and provide education to patient for prescribed medication. RN will report any adverse and/or side effects to prescribing provider.  Therapeutic Interventions: 1 on 1 counseling sessions, Psychoeducation, Medication administration, Evaluate responses to treatment, Monitor vital signs and CBGs as ordered, Perform/monitor CIWA, COWS, AIMS and Fall Risk screenings as ordered, Perform wound care treatments as ordered.  Evaluation of Outcomes: Progressing   LCSW Treatment Plan for Primary Diagnosis: Bipolar disorder, curr episode mixed, severe, w/o psychotic features ([place]) Long Term Goal(s): Safe transition to appropriate next level of care at discharge, Engage patient in therapeutic group addressing interpersonal concerns.  Short Term Goals: Engage patient in aftercare planning with referrals and resources  Therapeutic Interventions: Assess for all discharge needs, 1 to 1 time with Social worker, Explore available resources and support systems, Assess for adequacy in community support network, Educate family and significant other(s) on suicide prevention, Complete Psychosocial Assessment, Interpersonal group therapy.  Evaluation of Outcomes: Met   Progress in Treatment: Attending groups: Yes Participating in groups: Yes Taking medication as prescribed: Yes Toleration medication: Yes, no side effects reported at this time Family/Significant other contact made: No Patient understands diagnosis: Yes AEB asking for help with "getting back on track" Discussing patient identified problems/goals with staff: Yes Medical problems stabilized or resolved: Yes Denies suicidal/homicidal ideation: Yes Issues/concerns per patient self-inventory: None Other: N/A  New problem(s) identified: None identified at  this time.   New Short Term/Long Term Goal(s): None identified at this time.   Discharge Plan or Barriers: return home, follow up outpt  Reason for Continuation of Hospitalization:  [place]  Medication stabilization   Estimated Length of Stay: 3-5 days  Attendees: Patient: 02/21/2016  1:01 PM  Physician: Ursula Alert, MD 02/21/2016  1:01 PM  Nursing: Hoy Register, RN 02/21/2016  1:01 PM  RN Care Manager: Lars Pinks, RN 02/21/2016  1:01 PM  Social Worker: Ripley Fraise 02/21/2016  1:01 PM  Recreational Therapist: Laretta Bolster  02/21/2016  1:01 PM  Other: Norberto Sorenson 02/21/2016  1:01 PM  Other:  02/21/2016  1:01 PM    Scribe for Treatment Team:  Roque Lias 02/21/2016 1:01 PM

## 2016-02-21 NOTE — Progress Notes (Signed)
Recreation Therapy Notes  Date: 02/21/16 Time: 1000 Location: 500 Hall Dayroom  Group Topic: Self-Esteem  Goal Area(s) Addresses:  Patient will identify positive ways to increase self-esteem. Patient will verbalize benefit of increased self-esteem.  Behavioral Response: Engaged  Intervention: Worksheets with blank faces, markers, colored pencils  Activity: How I See Me.  LRT introduced the concept of self-esteem and why it's important.  Patients were given a worksheet with a blank face on it.  Patients were asked to draw or use words to describe how they see themselves on the blank face.  Patients were then asked to draw/write how other people see them on the back of the worksheet.  Education:  Self-Esteem, Building control surveyorDischarge Planning.   Education Outcome: Acknowledges education/In group clarification offered/Needs additional education  Clinical Observations/Feedback: Pt stated that self-esteem is "when you cherish yourself and have confidence in yourself".  Pt left early with doctor and returned at the end of group.    Cheryl Cohen, LRT/CTRS       Cheryl RancherLindsay, Kaileb Monsanto A 02/21/2016 12:18 PM

## 2016-02-21 NOTE — Progress Notes (Signed)
DAR NOTE: Patient presents with anxious affect and mood.  Patient is hyper verbal and tangential in speech at times.  Described energy level as hyper and concentration as good.  Denies pain, auditory and visual hallucinations.  Rates depression at 0, hopelessness at 0, and anxiety at 0.  Maintained on routine safety checks.  Medications given as prescribed.  Support and encouragement offered as needed.  Attended group and participated.  States goal for today is "to work and focus on myself."  Patient observed socializing with peers in the dayroom.  Offered no complaint.

## 2016-02-21 NOTE — [facility] Group Notes (Signed)
[facility] LCSW Group Therapy  02/21/2016 1:15 pm  Type of Therapy: Process Group Therapy  Participation Level:  Active  Participation Quality:  Appropriate  Affect:  Flat  Cognitive:  Oriented  Insight:  Improving  Engagement in Group:  Limited  Engagement in Therapy:  Limited  Modes of Intervention:  Activity, Clarification, Education, Problem-solving and Support  Summary of Progress/Problems: Today's group addressed the issue of overcoming obstacles.  Patients were asked to identify their biggest obstacle post d/c that stands in the way of their on-going success, and then problem solve as to how to manage this. Stayed the entire time, engaged throughout.  Talked about the loss of her twins when another patient was talking about grief and loss, but this was not the overall focus for her.  Rather, she wanted to talk about her strengths.  "I've been working since I was 21.  I am organized and they always ask me to take on more responsibility."  Others complemented her on her focus and drive.  Cheryl Cohen, Megham Dwyer B 02/21/2016   3:33 PM

## 2016-02-22 LAB — LIPID PANEL
CHOLESTEROL: 127 mg/dL (ref 0–200)
HDL: 57 mg/dL (ref >40)
LDL CALC: 62 mg/dL (ref 0–99)
Total CHOL/HDL Ratio: 2.2 RATIO
Triglycerides: 39 mg/dL (ref <150)
VLDL: 8 mg/dL (ref 0–40)

## 2016-02-22 LAB — TSH: TSH: 0.936 u[IU]/mL (ref 0.350–4.500)

## 2016-02-22 MED ORDER — HALOPERIDOL LACTATE 5 MG/ML IJ SOLN: 5.0000 mg | Freq: Two times a day (BID) | INTRAMUSCULAR | Status: DC | PRN

## 2016-02-22 MED ORDER — HALOPERIDOL 5 MG PO TABS
5.0000 mg | ORAL_TABLET | Freq: Two times a day (BID) | ORAL | Status: DC | PRN
Start: 2016-02-22 — End: 2016-02-23
  Administered 2016-02-22: 5 mg via ORAL
  Filled 2016-02-22: qty 1

## 2016-02-22 MED ORDER — LORAZEPAM 2 MG/ML IJ SOLN: 0.5000 mg | Freq: Four times a day (QID) | INTRAMUSCULAR | Status: DC | PRN

## 2016-02-22 MED ORDER — DIPHENHYDRAMINE HCL 25 MG PO CAPS
25.0000 mg | ORAL_CAPSULE | Freq: Two times a day (BID) | ORAL | Status: DC | PRN
  Administered 2016-02-22: 25 mg via ORAL
  Filled 2016-02-22: qty 1

## 2016-02-22 MED ORDER — LORAZEPAM 0.5 MG PO TABS
0.5000 mg | ORAL_TABLET | Freq: Four times a day (QID) | ORAL | Status: DC | PRN
  Administered 2016-02-22: 0.5 mg via ORAL
  Filled 2016-02-22: qty 1

## 2016-02-22 MED ORDER — HALOPERIDOL 5 MG PO TABS
10.0000 mg | ORAL_TABLET | Freq: Every day | ORAL | Status: DC
  Administered 2016-02-22: 10 mg via ORAL
  Filled 2016-02-22: qty 2
  Filled 2016-02-22: qty 14
  Filled 2016-02-22: qty 2

## 2016-02-22 MED ORDER — DIPHENHYDRAMINE HCL 50 MG/ML IJ SOLN: 25.0000 mg | Freq: Two times a day (BID) | INTRAMUSCULAR | Status: DC | PRN

## 2016-02-22 NOTE — Progress Notes (Signed)
Recreation Therapy Notes  Date: 02/22/16 Time: 1000 Location: 500 Hall Dayroom  Group Topic: Coping Skills  Goal Area(s) Addresses:  Pt will be able to identify positive coping skills. Pt will be able to identify the benefits of using coping skills post d/c.  Behavioral Response: Engaged  Intervention: Can with coping skills and various words written on paper  Activity: Electrical engineerCoping Skills Pictionary.  Patients picked a piece of paper from the can with a word on it.  Patient was to draw what was on the strip of paper on the board.  The remainder of the group had to try and guess what the person was drawing.  The person that guesses the picture will get the next turn.   Education: PharmacologistCoping Skills, Building control surveyorDischarge Planning.   Education Outcome: Acknowledges understanding/In group clarification offered/Needs additional education.   Clinical Observations/Feedback: Pt identified breathing and an exercise regimen as coping skills.  Pt was bright and active during group.  Pt and peer were singing but was able to be redirected.  Pt stated that the group was fun.  Pt expressed that if she uses her coping skills moving forward, they will help her be successful and get better.    Caroll RancherMarjette Sabrine Cohen, LRT/CTRS     Caroll RancherLindsay, [name] 02/22/2016 11:34 AM

## 2016-02-22 NOTE — [facility] Suicide Risk Assessment (Signed)
[facility] INPATIENT:  Family/Significant Other Suicide Prevention Education  Suicide Prevention Education:  Education Completed; No one has been identified by the patient as the family member/significant other with whom the patient will be residing, and identified as the person(s) who will aid the patient in the event of a mental health crisis (suicidal ideations/suicide attempt).  With written consent from the patient, the family member/significant other has been provided the following suicide prevention education, prior to the and/or following the discharge of the patient.  The suicide prevention education provided includes the following:  Suicide risk factors  Suicide prevention and interventions  National Suicide Hotline telephone number  [facility] assessment telephone number  [facility] Emergency Assistance 911  [facility] and/or Residential Mobile Crisis Unit telephone number  Request made of family/significant other to:  Remove weapons (e.g., guns, rifles, knives), all items previously/currently identified as safety concern.    Remove drugs/medications (over-the-counter, prescriptions, illicit drugs), all items previously/currently identified as a safety concern.  The family member/significant other verbalizes understanding of the suicide prevention education information provided.  The family member/significant other agrees to remove the items of safety concern listed above. The patient did not endorse SI at the time of admission, nor did the patient c/o SI during the stay here.  SPE not required.  Baldo DaubRodney B [facility] 02/22/2016, 2:05 PM

## 2016-02-22 NOTE — Progress Notes (Signed)
Patient ID: Cheryl GranaMiya [name] Scalese, female   DOB: 12-24-1990, 25 y.o.   MRN: [id number]  Pt exhibiting sexually inappropriate behaviors. Pt stands at door waving and dancing provocatively at patients on the other unit. Pt redirectable, reviewed rules/treatment agreement.

## 2016-02-22 NOTE — Progress Notes (Signed)
Patient was noted to be stomping on the floor while pacing, eye clenched shut hissing at peers and staff, talking in a low strange voice. When writer attempted to get patient's attention she snatched away from Clinical research associatewriter and hissed.  Physician was notified and patient was assessed by physician.  PRN psychiatric medications were ordered and given to patient.

## 2016-02-22 NOTE — Progress Notes (Signed)
DAR NOTE: Patient remained hyperactivelypresents with anxious affect and depressed mood.  Denies pain, auditory and visual hallucinations.  Rates depression at 0, hopelessness at 0, and anxiety at 0.  Described energy level as high and concentration as good.  Maintained on routine safety checks.  Medications given as prescribed.  Support and encouragement offered as needed.  Attended group and participated.  States goal for today is "to continue to keep a positive attitude."  Patient observed socializing with peers in the dayroom.  Offered no complaint.
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
## 2016-02-22 NOTE — [facility] Group Notes (Signed)
Pt goal for tomorrow is to work on getting home to her family and friends. Pt was excited she got to see her mother and Godmother today.

## 2016-02-22 NOTE — Progress Notes (Signed)
[facility]  02/22/2016 2:42 PM Briseis Randel PiggBrianna Capistran  MRN:  [id number] Subjective: Patient states " I am ok. I want to go home soon."  Objective: Patient is a 25 y.o.AA female, who is single , employed , [place] , has a hx of Bipolar disorder , noncompliant on medications , who presented to [facility] with manic sx.  Patient seen and chart reviewed.Discussed patient with treatment team.  Pt today seen as labile, euphoric , hyperactive, dancing in the hallways , is intrusive , and per staff has been sexually inappropriate with peers. Pt later on in the after noon was seen as delusional , talking in a soft tone , hissing at peers and could nto be verbally redirected requiring PRN haldol, benadryl, ativan PO. Pt continues to need encouragement and support.    Principal Problem: Bipolar disorder, curr episode mixed, severe, w/o psychotic features (HCC) Diagnosis:   Patient Active Problem List   Diagnosis Date Noted  . Bipolar disorder, curr episode mixed, severe, w/o psychotic features (HCC) [F31.63] 02/21/2016  . Fetal demise, greater than 22 weeks, antepartum [O36.4XX0] 09/08/2015  . Suicidal ideation [R45.851] 10/06/2013  . Family history of diabetes mellitus type II [Z83.3] 01/11/2012   Total Time spent with patient: 30 minutes  Past Psychiatric History: Please see H&P.   Past Medical History:  Past Medical History:  Diagnosis Date  . Anxiety   . Bipolar 1 disorder (HCC)    hospitalized  3 x onset rx in 9th grade   . Depression   . Gonorrhea   . Hx of varicella   . Syphilis   . Twin pregnancy, delivered vaginally, current hospitalization 09/09/2015    Past Surgical History:  Procedure Laterality Date  . DILATION AND EVACUATION N/A 09/09/2015   Procedure: DILATATION AND EVACUATION;  Surgeon: Lavina Hammanodd Meisinger, MD;  Location: [facility];  Service: Gynecology;  Laterality: N/A;  . TYMPANOSTOMY TUBE PLACEMENT     asage 2-3 yrs   Family History:  Family History   Problem Relation Age of Onset  . Diabetes Mother   . Arthritis Mother   . Hypertension Father   . Alcohol abuse Father   . Alcoholism Father    Family Psychiatric  History: Please see H&P.  Social History:  History  Alcohol Use No     History  Drug Use No    Social History   Social History  . Marital status: Single    Spouse name: N/A  . Number of children: N/A  . Years of education: N/A   Social History Main Topics  . Smoking status: Former Smoker    Packs/day: 0.20    Types: Cigarettes  . Smokeless tobacco: Never Used     Comment: 1 cigarette a day  . Alcohol use No  . Drug use: No  . Sexual activity: No   Other Topics Concern  . 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  acetaminophen (TYLENOL) tablet 650 mg  650 mg Oral Q4H PRN Charm Rings, NP      . alum & mag hydroxide-simeth (MAALOX/MYLANTA) 200-200-20 MG/5ML suspension 30 mL  30 mL Oral PRN Charm Rings, NP      . B-complex with vitamin C tablet 1 tablet  1 tablet Oral Daily Charm Rings, NP   1 tablet at 02/22/16 0744  . benztropine (COGENTIN) tablet 0.5 mg  0.5 mg Oral QHS Jomarie Longs, MD   0.5 mg at 02/21/16 2122  . diphenhydrAMINE (BENADRYL) capsule 25 mg  25 mg Oral BID PRN Jomarie Longs, MD   25 mg at 02/22/16 1336   Or  . diphenhydrAMINE (BENADRYL) injection 25 mg  25 mg Intramuscular BID PRN Jomarie Longs, MD      . haloperidol (HALDOL) tablet 10 mg  10 mg Oral QHS Fayrene Towner, MD      . haloperidol (HALDOL) tablet 5 mg  5 mg Oral BID PRN Jomarie Longs, MD   5 mg at 02/22/16 1336   Or  . haloperidol  lactate (HALDOL) injection 5 mg  5 mg Intramuscular BID PRN Jomarie Longs, MD      . ibuprofen (ADVIL,MOTRIN) tablet 600 mg  600 mg Oral Q8H PRN Charm Rings, NP      . LORazepam (ATIVAN) tablet 0.5 mg  0.5 mg Oral Q6H PRN Jomarie Longs, MD   0.5 mg at 02/22/16 1336   Or  . LORazepam (ATIVAN) injection 0.5 mg  0.5 mg Intramuscular Q6H PRN Cornelius Marullo, MD      . magnesium hydroxide (MILK OF MAGNESIA) suspension 30 mL  30 mL Oral Daily PRN Charm Rings, NP      . omega-3 acid ethyl esters (LOVAZA) capsule 1 g  1 g Oral Daily Charm Rings, NP   1 g at 02/22/16 0744  . Oxcarbazepine (TRILEPTAL) tablet 300 mg  300 mg Oral Daily Jomarie Longs, MD   300 mg at 02/22/16 0744  . Oxcarbazepine (TRILEPTAL) tablet 600 mg  600 mg Oral QPM Kamica Florance, MD   600 mg at 02/21/16 1817  . sertraline (ZOLOFT) tablet 25 mg  25 mg Oral QHS Jomarie Longs, MD   25 mg at 02/21/16 2122    Lab Results:  Results for orders placed or performed during the hospital encounter of 02/20/16 (from the past 48 hour(s))  TSH     Status: None   Collection Time: 02/22/16  6:29 AM  Result Value Ref Range   TSH 0.936 0.350 - 4.500 uIU/mL    Comment: Performed by a 3rd Generation assay with a functional sensitivity of <=0.01 uIU/mL. Performed at [facility]   Lipid panel     Status: None   Collection Time: 02/22/16  6:29 AM  Result Value Ref Range   Cholesterol 127 0 - 200 mg/dL   Triglycerides 39 <540 mg/dL   HDL 57 >98 mg/dL   Total CHOL/HDL Ratio 2.2 RATIO   VLDL 8 0 - 40 mg/dL   LDL Cholesterol 62 0 - 99 mg/dL    Comment:        Total Cholesterol/HDL:CHD Risk Coronary Heart Disease Risk Table                     Men   Women  1/2 Average Risk   3.4   3.3  Average Risk       5.0   4.4  2 X Average Risk  9.6   7.1  3 X Average Risk  23.4   11.0        Use the calculated Patient Ratio above and the CHD Risk Table to determine the patient's CHD Risk.        ATP III CLASSIFICATION  (LDL):  <100     mg/dL   Optimal  478-295  mg/dL   Near or Above                    Optimal  130-159  mg/dL   Borderline  621-308  mg/dL   High  >657     mg/dL   Very High Performed at [facility]     Blood Alcohol level:  Lab Results  Component Value Date   [facility] <5 02/19/2016   ETH <5 02/16/2016    Metabolic Disorder Labs: Lab Results  Component Value Date   HGBA1C 5.6 01/11/2012   MPG 114 10/31/2009   MPG 120 02/18/2008   Lab Results  Component Value Date   PROLACTIN  10/31/2009    35.4 (NOTE)     Reference Ranges:                 Female:                       2.1 -  17.1 ng/ml                 Female:   Pregnant          9.7 - 208.5 ng/mL                           Non Pregnant      2.8 -  29.2 ng/mL                           Post  Menopausal   1.8 -  20.3 ng/mL                     PROLACTIN  02/18/2008    32.7 (NOTE)     Reference Ranges:                 Female:                       2.1 -  17.1 ng/ml                 Female:   Pregnant          9.7 - 208.5 ng/mL                           Non Pregnant      2.8 -  29.2 ng/mL                           Post  Menopausal   1.8 -  20.3 ng/mL                     Lab Results  Component Value Date   CHOL 127 02/22/2016   TRIG 39 02/22/2016   HDL 57 02/22/2016   CHOLHDL 2.2 02/22/2016   VLDL 8 02/22/2016   LDLCALC 62 02/22/2016   LDLCALC 71 01/11/2012    Physical Findings: AIMS: Facial and Oral  Movements Muscles of Facial Expression: None, normal Lips and Perioral Area: None, normal Jaw: None, normal Tongue: None, normal,Extremity Movements Upper (arms, wrists, hands, fingers): None, normal Lower (legs, knees, ankles, toes): None, normal, Trunk Movements Neck, shoulders, hips: None, normal, Overall Severity Severity of abnormal movements (highest score from questions above): None, normal Incapacitation due to abnormal movements: None, normal Patient's awareness of abnormal movements (rate only patient's report): No  Awareness, Dental Status Current problems with teeth and/or dentures?: No Does patient usually wear dentures?: No  CIWA:    COWS:     Musculoskeletal: Strength & Muscle Tone: within normal limits Gait & Station: normal Patient leans: N/A  Psychiatric Specialty Exam: Physical Exam  Nursing note and vitals reviewed.   Review of Systems  Psychiatric/Behavioral: The patient is nervous/anxious.   All other systems reviewed and are negative.   Blood pressure 113/72, pulse (!) 112, temperature 98.2 F (36.8 C), temperature source Oral, resp. rate 16, height 5\' 7"  (1.702 m), weight 57.6 kg (127 lb), SpO2 100 %.Body mass index is 19.89 kg/m.  General Appearance: Guarded  Eye Contact:  Fair  Speech:  Pressured  Volume:  Normal  Mood:  Anxious and Euphoric  Affect:  Labile  Thought Process:  Irrelevant and Descriptions of Associations: Loose on and off   Orientation:  Full (Time, Place, and Person)  Thought Content:  Delusions and Rumination pregnancy conspiracy and so on - not too preoccupied with it   Suicidal Thoughts:  No  Homicidal Thoughts:  No  Memory:  Immediate;   Fair Recent;   Fair Remote;   Fair  Judgement:  Impaired  Insight:  Shallow  Psychomotor Activity:  Increased and Restlessness  Concentration:  Concentration: Fair and Attention Span: Fair  Recall:  [employer] of Knowledge:  Fair  Language:  Fair  Akathisia:  No  Handed:  Right  AIMS (if indicated):     Assets:  Desire for Improvement  ADL's:  Intact  Cognition:  WNL  Sleep:  Number of Hours: 6     Treatment Plan Summary:Patient seen as manic , having episodes when she is delusional and anxious - requiring PRN medications - will continue treatment.  Bipolar disorder, curr episode mixed, severe, w/o psychotic features (HCC) unstable   Will continue today 02/22/16  plan as below except where it is noted.   Daily contact with patient to assess and evaluate symptoms and progress in treatment and  Medication management For mood lability: Will continue Trilpetal 300 mg po daily, 600 mg po qpm.   For Psychosis: Will increase  Haldol to 10  mg po qhs . Will continue Cogentin 0.5 mg po qhs for EPS.  For affective sx: Reduced zoloft to 25 mg po daily - since it may stimulating at this time.  For anxiety/agitation: Will continue PRN medications as per agitation protocol.  Reviewed past medical records,treatment plan.   Will continue to monitor vitals ,medication compliance and treatment side effects while patient is here.   Will monitor for medical issues as well as call consult as needed.   Reviewed labs cbc - wnl, cmp - wnl, UDS- negative , pregnancy test - negative - 02/16/16, 02/19/16, tsh - wnl  lipid panel- wnl pending hba1c, pl.  I have reviewed EKG for qtc monitoring since she is on haldol- qtc - wnl , sinus arrythmia with NSR- patient asymptomatic - will refer to follow up with out patient provider.  CSW will continue working on disposition. Will obtain collateral  information from family.  Patient to participate in therapeutic milieu .  Adell Koval, MD 02/22/2016, 2:42 PM

## 2016-02-22 NOTE — Progress Notes (Addendum)
Nursing Progress Note: 7p-7a D: Pt currently presents with a hyperactive/tangential affect and behavior. Pt reports to writer that their goal is to "get home to whoever wants me there." Pt states "I got my babies in belly, but I'm still dancing. I like Jonette EvaKendrick Lamar. I hope that I can get out of here tomorrow. I'm gonna leave if I take meds. Let's go get medicated. I like doing that." Pt reports "exuberant" sleep with current medication regimen.   A: Pt provided with medications per providers orders. Pt's labs and vitals were monitored throughout the night. Pt supported emotionally and encouraged to express concerns and questions. Pt educated on medications.  R: Pt's safety ensured with 15 minute and environmental checks. Pt currently denies SI/HI/Self Harm and A/V hallucinations. Pt verbally agrees to seek staff if SI/HI or A/VH occurs and to consult with staff before acting on any harmful thoughts. Will continue POC.

## 2016-02-23 LAB — PROLACTIN: PROLACTIN: 92.6 ng/mL — AB (ref 4.8–23.3)

## 2016-02-23 LAB — HEMOGLOBIN A1C
Hgb A1c MFr Bld: 5.5 % (ref 4.8–5.6)
MEAN PLASMA GLUCOSE: 111 mg/dL

## 2016-02-23 MED ORDER — B COMPLEX-C PO TABS: 1.0000 | ORAL_TABLET | Freq: Every day | ORAL | Status: DC

## 2016-02-23 MED ORDER — IBUPROFEN 800 MG PO TABS: 800.0000 mg | ORAL_TABLET | Freq: Three times a day (TID) | ORAL | 0 refills | Status: DC | PRN

## 2016-02-23 MED ORDER — SERTRALINE HCL 25 MG PO TABS: 25.0000 mg | ORAL_TABLET | Freq: Every day | ORAL | 0 refills | Status: DC

## 2016-02-23 MED ORDER — HALOPERIDOL 10 MG PO TABS: 10.0000 mg | ORAL_TABLET | Freq: Every day | ORAL | 0 refills | Status: DC

## 2016-02-23 MED ORDER — OXCARBAZEPINE 300 MG PO TABS: ORAL_TABLET | ORAL | 0 refills | Status: DC

## 2016-02-23 MED ORDER — BENZTROPINE MESYLATE 0.5 MG PO TABS: 0.5000 mg | ORAL_TABLET | Freq: Every day | ORAL | 0 refills | Status: DC

## 2016-02-23 MED ORDER — OMEGA-3-ACID ETHYL ESTERS 1 G PO CAPS: 1.0000 g | ORAL_CAPSULE | Freq: Every day | ORAL | Status: DC

## 2016-02-23 NOTE — Progress Notes (Signed)
Pt discharged home with her god mother. Pt was ambulatory, stable and appreciative at that time. All papers and prescriptions were given and valuables returned. Verbal understanding expressed. Denies SI/HI and A/VH. Pt given opportunity to express concerns and ask questions.

## 2016-02-23 NOTE — [facility] Suicide Risk Assessment (Signed)
[facility]   Principal Problem: Bipolar disorder, curr episode mixed, severe, w/o psychotic features [facility](HCC) Discharge Diagnoses:  Patient Active Problem List   Diagnosis Date Noted  . Bipolar disorder, curr episode mixed, severe, w/o psychotic features (HCC) [F31.63] 02/21/2016  . Fetal demise, greater than 22 weeks, antepartum [O36.4XX0] 09/08/2015  . Suicidal ideation [R45.851] 10/06/2013  . Family history of diabetes mellitus type II [Z83.3] 01/11/2012    Total Time spent with patient: 30 minutes  Musculoskeletal: Strength & Muscle Tone: within normal limits Gait & Station: normal Patient leans: N/A  Psychiatric Specialty Exam: Review of Systems  Psychiatric/Behavioral: Negative for depression, hallucinations and suicidal ideas. The patient is not nervous/anxious.   All other systems reviewed and are negative.   Blood pressure 115/71, pulse (!) 130, temperature 98.2 F (36.8 C), temperature source Oral, resp. rate 18, height 5\' 7"  (1.702 m), weight 57.6 kg (127 lb), SpO2 100 %.Body mass index is 19.89 kg/m.  General Appearance: Casual  Eye Contact::  Fair  Speech:  Clear and Coherent409  Volume:  Normal  Mood:  Euthymic  Affect:  Appropriate  Thought Process:  Goal Directed and Descriptions of Associations: Intact  Orientation:  Full (Time, Place, and Person)  Thought Content:  WDL  Suicidal Thoughts:  No  Homicidal Thoughts:  No  Memory:  Immediate;   Fair Recent;   Fair Remote;   Fair  Judgement:  Fair  Insight:  Fair  Psychomotor Activity:  Normal  Concentration:  Fair  Recall:  [employer]Fair  Fund of Knowledge:Fair  Language: Fair  Akathisia:  No  Handed:  Right  AIMS (if indicated):   0  Assets:  Desire for Improvement  Sleep:  Number of Hours: 6.75  Cognition: WNL  ADL's:  Intact   Mental Status Per Nursing Assessment::   On Admission:     Demographic Factors:  Adolescent or young adult  Loss Factors: NA  Historical  Factors: Impulsivity  Risk Reduction Factors:   Positive social support and Positive therapeutic relationship  Continued Clinical Symptoms:  Bipolar Disorder: improved - continue treatment.  Cognitive Features That Contribute To Risk:  None    Suicide Risk:  Minimal: No identifiable suicidal ideation.  Patients presenting with no risk factors but with morbid ruminations; may be classified as minimal risk based on the severity of the depressive symptoms  Follow-up Information    [facility] Follow up on 02/23/2016.   Why:  [facility] 5[facility]30 [facility] Contact information: [address] [place] [address] [phone]           Plan Of Care/Follow-up recommendations:  Activity:  no restrictions Diet:  regular Tests:  follow up on your prolactin level, pt with recent hx of pregnancy Other:  none  Heidy Mccubbin, MD 02/23/2016, 9:12 AM

## 2016-02-23 NOTE — Progress Notes (Signed)
Recreation Therapy Notes  Date: 02/23/16 Time: 1000 Location: 500 [address] Dayroom  Group Topic: Self-Expression  Goal Area(s) Addresses:  Patient will identify things they are thankful for. Patient will verbalize benefit of being thankful.  Behavioral Response: Engaged  Intervention: Worksheet with a picture frame, markers  Activity: What I'm Thankful For.  Patients were given a worksheet with the outline of a picture frame.  Patients were to draw, write a poem or list the things they were thankful for.  Education:  Self-Expression, Building control surveyorDischarge Planning.   Education Outcome: Acknowledges education/In group clarification offered/Needs additional education  Clinical Observations/Feedback: Pt was bright and stated she was happy to be going home.  Pt expressed she was thankful for her health, Christ, family, sunset, birds and herself.  Pt also stated that being thankful will allow her to have a good relationship with her family and it won't let her allow anyone to hold her back.   Caroll RancherMarjette Gali Spinney, LRT/CTRS     Caroll RancherLindsay, Berdella Bacot A 02/23/2016 10:46 AM

## 2016-02-23 NOTE — Tx Team (Signed)
Interdisciplinary Treatment and Diagnostic Plan Update  02/23/2016 Time of Session: 2:07 PM  Cheryl Cohen MRN: [id number]  Principal Diagnosis: Bipolar disorder, curr episode mixed, severe, w/o psychotic features ([place])  Secondary Diagnoses: Principal Problem:   Bipolar disorder, curr episode mixed, severe, w/o psychotic features ([place])   Current Medications:  Current Facility-Administered Medications  Medication Dose Route Frequency Provider Last Rate Last Dose  . acetaminophen (TYLENOL) tablet 650 mg  650 mg Oral Q4H PRN Patrecia Pour, NP      . alum & mag hydroxide-simeth (MAALOX/MYLANTA) 200-200-20 MG/5ML suspension 30 mL  30 mL Oral PRN Patrecia Pour, NP      . B-complex with vitamin C tablet 1 tablet  1 tablet Oral Daily Patrecia Pour, NP   1 tablet at 02/23/16 [id number]  . benztropine (COGENTIN) tablet 0.5 mg  0.5 mg Oral QHS Ursula Alert, MD   0.5 mg at 02/22/16 2124  . diphenhydrAMINE (BENADRYL) capsule 25 mg  25 mg Oral BID PRN Ursula Alert, MD   25 mg at 02/22/16 1336   Or  . diphenhydrAMINE (BENADRYL) injection 25 mg  25 mg Intramuscular BID PRN Ursula Alert, MD      . haloperidol (HALDOL) tablet 10 mg  10 mg Oral QHS Ursula Alert, MD   10 mg at 02/22/16 2125  . haloperidol (HALDOL) tablet 5 mg  5 mg Oral BID PRN Ursula Alert, MD   5 mg at 02/22/16 1336   Or  . haloperidol lactate (HALDOL) injection 5 mg  5 mg Intramuscular BID PRN Ursula Alert, MD      . ibuprofen (ADVIL,MOTRIN) tablet 600 mg  600 mg Oral Q8H PRN Patrecia Pour, NP      . LORazepam (ATIVAN) tablet 0.5 mg  0.5 mg Oral Q6H PRN Ursula Alert, MD   0.5 mg at 02/22/16 1336   Or  . LORazepam (ATIVAN) injection 0.5 mg  0.5 mg Intramuscular Q6H PRN Saramma Eappen, MD      . magnesium hydroxide (MILK OF MAGNESIA) suspension 30 mL  30 mL Oral Daily PRN Patrecia Pour, NP      . omega-3 acid ethyl esters (LOVAZA) capsule 1 g  1 g Oral Daily Patrecia Pour, NP   1 g at 02/23/16 0733  . Oxcarbazepine  (TRILEPTAL) tablet 300 mg  300 mg Oral Daily Ursula Alert, MD   300 mg at 02/23/16 0734  . Oxcarbazepine (TRILEPTAL) tablet 600 mg  600 mg Oral QPM Saramma Eappen, MD   600 mg at 02/22/16 1830  . sertraline (ZOLOFT) tablet 25 mg  25 mg Oral QHS Ursula Alert, MD   25 mg at 02/22/16 2124    PTA Medications: Prescriptions Prior to Admission  Medication Sig Dispense Refill Last Dose  . lurasidone (LATUDA) 40 MG TABS tablet Take 40 mg by mouth daily.   02/18/2016 at Unknown time  . sertraline (ZOLOFT) 50 MG tablet Take 1 tablet (50 mg total) by mouth at bedtime. 30 tablet 0 02/18/2016 at Unknown time  . ziprasidone (GEODON) 20 MG capsule Take 1 capsule (20 mg total) by mouth daily with lunch. (Patient not taking: Reported on 02/19/2016) 30 capsule 0 Not Taking at Unknown time  . [DISCONTINUED] B Complex-C (B-COMPLEX WITH VITAMIN C) tablet Take 1 tablet by mouth daily.   02/18/2016 at Unknown time  . [DISCONTINUED] ibuprofen (ADVIL,MOTRIN) 800 MG tablet Take 1 tablet (800 mg total) by mouth every 8 (eight) hours as needed for moderate pain. 45 tablet  1 Past Week at Unknown time  . [DISCONTINUED] omega-3 acid ethyl esters (LOVAZA) 1 g capsule Take 1 g by mouth daily.   02/18/2016 at Unknown time    Treatment Modalities: Medication Management, Group therapy, Case management,  1 to 1 session with clinician, Psychoeducation, Recreational therapy.   Physician Treatment Plan for Primary Diagnosis: Bipolar disorder, curr episode mixed, severe, w/o psychotic features ([place]) Long Term Goal(s): Improvement in symptoms so as ready for discharge  Short Term Goals: Ability to verbalize feelings will improve   Medication Management: Evaluate patient's response, side effects, and tolerance of medication regimen.  Therapeutic Interventions: 1 to 1 sessions, Unit Group sessions and Medication administration.  Evaluation of Outcomes: Adequate for Discharge  Physician Treatment Plan for Secondary  Diagnosis: Principal Problem:   Bipolar disorder, curr episode mixed, severe, w/o psychotic features ([place])   Long Term Goal(s): Improvement in symptoms so as ready for discharge  Short Term Goals: Compliance with prescribed medications will improve  Medication Management: Evaluate patient's response, side effects, and tolerance of medication regimen.  Therapeutic Interventions: 1 to 1 sessions, Unit Group sessions and Medication administration.  Evaluation of Outcomes: Adequate for Discharge   RN Treatment Plan for Primary Diagnosis: Bipolar disorder, curr episode mixed, severe, w/o psychotic features ([place]) Long Term Goal(s): Knowledge of disease and therapeutic regimen to maintain health will improve  Short Term Goals: Ability to demonstrate self-control and Ability to identify and develop effective coping behaviors will improve  Medication Management: RN will administer medications as ordered by provider, will assess and evaluate patient's response and provide education to patient for prescribed medication. RN will report any adverse and/or side effects to prescribing provider.  Therapeutic Interventions: 1 on 1 counseling sessions, Psychoeducation, Medication administration, Evaluate responses to treatment, Monitor vital signs and CBGs as ordered, Perform/monitor CIWA, COWS, AIMS and Fall Risk screenings as ordered, Perform wound care treatments as ordered.  Evaluation of Outcomes: Adequate for Discharge   LCSW Treatment Plan for Primary Diagnosis: Bipolar disorder, curr episode mixed, severe, w/o psychotic features ([place]) Long Term Goal(s): Safe transition to appropriate next level of care at discharge, Engage patient in therapeutic group addressing interpersonal concerns.  Short Term Goals: Engage patient in aftercare planning with referrals and resources  Therapeutic Interventions: Assess for all discharge needs, 1 to 1 time with Social worker, Explore available resources and  support systems, Assess for adequacy in community support network, Educate family and significant other(s) on suicide prevention, Complete Psychosocial Assessment, Interpersonal group therapy.  Evaluation of Outcomes: Met   Progress in Treatment: Attending groups: Yes Participating in groups: Yes Taking medication as prescribed: Yes Toleration medication: Yes, no side effects reported at this time Family/Significant other contact made: No Patient understands diagnosis: Yes AEB asking for help with "getting back on track" Discussing patient identified problems/goals with staff: Yes Medical problems stabilized or resolved: Yes Denies suicidal/homicidal ideation: Yes Issues/concerns per patient self-inventory: None Other: N/A  New problem(s) identified: None identified at this time.   New Short Term/Long Term Goal(s): None identified at this time.   Discharge Plan or Barriers: return home, follow up outpt  Reason for Continuation of Hospitalization:   Estimated Length of Stay: D/C today  Attendees: Patient: 02/23/2016  2:07 PM  Physician: Ursula Alert, MD 02/23/2016  2:07 PM  Nursing: Hoy Register, RN 02/23/2016  2:07 PM  RN Care Manager: Lars Pinks, RN 02/23/2016  2:07 PM  Social Worker: Ripley Fraise 02/23/2016  2:07 PM  Recreational Therapist: Laretta Bolster  02/23/2016  2:07 PM  Other: Norberto Sorenson 02/23/2016  2:07 PM  Other:  02/23/2016  2:07 PM    Scribe for Treatment Team:  Roque Lias 02/23/2016 2:07 PM

## 2016-02-23 NOTE — Progress Notes (Signed)
Nursing Progress Note: 7p-7a D: Pt currently presents with a blunted/sullen/appropriate to circumstance affect and behavior. Pt states "I can't wait to go home, so I'm gonna take meds and do what I'm supposed to do." Pt reports "amazing" sleep with current medication regimen.   A: Pt provided with medications per providers orders. Pt's labs and vitals were monitored throughout the night. Pt supported emotionally and encouraged to express concerns and questions. Pt educated on medications.  R: Pt's safety ensured with 15 minute and environmental checks. Pt currently denies SI/HI/Self Harm and A/V hallucinations. Pt verbally agrees to seek staff if SI/HI or A/VH occurs and to consult with staff before acting on any harmful thoughts. Will continue POC.

## 2016-02-23 NOTE — Plan of Care (Signed)
Problem: [facility] Participation in Recreation Therapeutic Interventions Goal: STG-Patient will identify at least five coping skills for ** STG: Coping Skills - Patient will be able to identify at least 5 coping skills for improving relationship with family by conclusion of recreation therapy tx  Outcome: Completed/Met Date Met: 02/23/16 Pt was able to identify coping skills for improving relationship with family by completion of coping skills recreation therapy session.  Victorino Sparrow, LRT/CTRS

## 2016-02-23 NOTE — Discharge Summary (Signed)
Physician Discharge Summary Note  Patient:  Cheryl Cohen is an 25 y.o., female MRN:  [id number] DOB:  20-Jul-1990 Patient phone:  [phone] (home)  Patient address:   [address] [place] [address],   Total Time spent with patient: Greater than 30 minutes  Date of Admission:  02/20/2016  Date of Discharge: 02-23-16  Reason for Admission: Worsening symptoms of Bipolar disorder, manic episodes.  Principal Problem: Bipolar disorder, curr episode mixed, severe, w/o psychotic features [facility](HCC)  Discharge Diagnoses: Patient Active Problem List   Diagnosis Date Noted  . Bipolar disorder, curr episode mixed, severe, w/o psychotic features (HCC) [F31.63] 02/21/2016  . Fetal demise, greater than 22 weeks, antepartum [O36.4XX0] 09/08/2015  . Suicidal ideation [R45.851] 10/06/2013  . Family history of diabetes mellitus type II [Z83.3] 01/11/2012   Past Psychiatric History: Bipolar disorder  Past Medical History:  Past Medical History:  Diagnosis Date  . Anxiety   . Bipolar 1 disorder (HCC)    hospitalized  3 x onset rx in 9th grade   . Depression   . Gonorrhea   . Hx of varicella   . Syphilis   . Twin pregnancy, delivered vaginally, current hospitalization 09/09/2015    Past Surgical History:  Procedure Laterality Date  . DILATION AND EVACUATION N/A 09/09/2015   Procedure: DILATATION AND EVACUATION;  Surgeon: Lavina Hammanodd Meisinger, MD;  Location: [facility];  Service: Gynecology;  Laterality: N/A;  . TYMPANOSTOMY TUBE PLACEMENT     asage 2-3 yrs   Family History:  Family History  Problem Relation Age of Onset  . Diabetes Mother   . Arthritis Mother   . Hypertension Father   . Alcohol abuse Father   . Alcoholism Father    Family Psychiatric  History: See H&P  Social History:  History  Alcohol Use No     History  Drug Use No    Social History   Social History  . Marital status: Single    Spouse name: N/A  . Number of children: N/A  . Years of education: N/A    Social History Main Topics  . Smoking status: Former Smoker    Packs/day: 0.20    Types: Cigarettes  . Smokeless tobacco: Never Used     Comment: 1 cigarette a day  . Alcohol use No  . Drug use: No  . Sexual activity: No   Other Topics Concern  . 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  Cheryl Cohen [facility] for worsening symptoms of Bipolar I disorder, most recent episode, manic. Reports indicated that patient is not compliant with her medication regimen. Cheryl Cohen was in need of mood stabilization treatments. After evaluation of her symptoms, she was medicated & discharged on; Haldol 10 mg for mood control, Trileptal 300 mg & 600 mg respectively for mood stabilization & sertraline 25 mg for depression. She was also enrolled in the group counseling sessions being offered & held on this unit. She learned coping skills. Cheryl Cohen presented no other significant pre-existing medical problems  that required treatment & or monitoring. She tolerated her treatment regimen without any adverse effects or reactions.  During the course of her treatment, [name]'s progress was monitored by observation & her daily report of symptom reduction noted. Her emotional & mental status were assessed & monitored by the daily self-inventory reports completed by her & the clinical staff. She was also evaluated daily by the treatment team for mood stability & plans for continued recovery after discharge. [name]'s motivation was an integral factor her mood stability. She was offered further treatment options upon discharge on an outpatient basis  as listed below. She was provided with all the necessary information needed to make this appointment without problems.   Upon discharge, she was both mentally & medically stable. She adamntly denied any suicidal/homicidal ideations, auditory/visual/tactile hallucinations, delusional thoughts & or paranoia. [name] in no apparent distress. Transportation per family.  Physical Findings: AIMS: Facial and Oral Movements Muscles of Facial Expression: None, normal Lips and Perioral Area: None, normal Jaw: None, normal Tongue: None, normal,Extremity Movements Upper (arms, wrists, hands, fingers): None, normal Lower (legs, knees, ankles, toes): None, normal, Trunk Movements Neck, shoulders, hips: None, normal, Overall Severity Severity of abnormal movements (highest score from questions above): None, normal Incapacitation due to abnormal movements: None, normal Patient's awareness of abnormal movements (rate only patient's report): No Awareness, Dental Status Current problems with teeth and/or dentures?: No Does patient usually wear dentures?: No  CIWA:    COWS:     Musculoskeletal: Strength & Muscle Tone: within normal limits Gait & Station: normal Patient leans: N/A  Psychiatric Specialty Exam: Physical Exam  Constitutional: She is oriented to person, place, and time. She appears well-developed.  HENT:  Head: Normocephalic.  Eyes: Pupils are equal, round, and reactive to light.  Neck: Normal range of motion.  Cardiovascular: Normal rate.   Respiratory: Effort normal.  GI: Soft.  Genitourinary:  Genitourinary Comments: Deferred  Musculoskeletal: Normal range of motion.  Neurological: She is alert and oriented to person, place, and time.  Skin: Skin is warm and dry.    Review of Systems  Constitutional: Negative.   HENT: Negative.   Eyes: Negative.   Respiratory: Negative.   Cardiovascular: Negative.   Gastrointestinal: Negative.    Genitourinary: Negative.   Musculoskeletal: Negative.   Skin: Negative.   Neurological: Negative.   Endo/Heme/Allergies: Negative.   Psychiatric/Behavioral: Positive for depression (Stable). Negative for hallucinations, memory loss, substance abuse and suicidal ideas. The patient has insomnia (Stable). The patient is not nervous/anxious.     Blood pressure 115/71, pulse (!) 130, temperature 98.2 F (36.8 C), temperature source Oral, resp. rate 18, height 5\' 7"  (1.702 m), weight 57.6 kg (127 lb), SpO2 100 %.Body mass index is 19.89 kg/m.  See Md's SRA   Have you used any form of tobacco in the last 30 days? (Cigarettes, Smokeless Tobacco, Cigars, and/or Pipes): No  Has this patient used any form of tobacco in the last 30 days? (Cigarettes, Smokeless Tobacco, Cigars, and/or Pipes): No  Blood Alcohol level:  Lab Results  Component Value Date   [facility] 02/19/2016   ETH <5 02/16/2016   Metabolic Disorder Labs:  Lab Results  Component Value Date   HGBA1C 5.5 02/22/2016   MPG 111 02/22/2016   MPG 114 10/31/2009   Lab Results  Component Value Date   PROLACTIN 92.6 (H) 02/22/2016   PROLACTIN  10/31/2009    35.4 (NOTE)     Reference Ranges:  Female:                       2.1 -  17.1 ng/ml                 Female:   Pregnant          9.7 - 208.5 ng/mL                           Non Pregnant      2.8 -  29.2 ng/mL                           Post  Menopausal   1.8 -  20.3 ng/mL                     Lab Results  Component Value Date   CHOL 127 02/22/2016   TRIG 39 02/22/2016   HDL 57 02/22/2016   CHOLHDL 2.2 02/22/2016   VLDL 8 02/22/2016   LDLCALC 62 02/22/2016   LDLCALC 71 01/11/2012   See Psychiatric Specialty Exam and Suicide Risk Assessment completed by Attending Physician prior to discharge.  Discharge destination:  Home  Is patient on multiple antipsychotic therapies at discharge:  No   Has Patient had three or more failed trials of antipsychotic monotherapy by  history:  No  Recommended Plan for Multiple Antipsychotic Therapies: NA    Medication List    STOP taking these medications   lurasidone 40 MG Tabs tablet Commonly known as:  LATUDA   ziprasidone 20 MG capsule Commonly known as:  GEODON     TAKE these medications     Indication  B-complex with vitamin C tablet Take 1 tablet by mouth daily. Vitamin supplement What changed:  additional instructions  Indication:  Vitamin supplement   benztropine 0.5 MG tablet Commonly known as:  COGENTIN Take 1 tablet (0.5 mg total) by mouth at bedtime. For prevention of drug induced tremors.  Indication:  Extrapyramidal Reaction caused by Medications   haloperidol 10 MG tablet Commonly known as:  HALDOL Take 1 tablet (10 mg total) by mouth at bedtime. For mood control  Indication:  Mood control   ibuprofen 800 MG tablet Commonly known as:  ADVIL,MOTRIN Take 1 tablet (800 mg total) by mouth every 8 (eight) hours as needed for moderate pain.  Indication:  Moderate pain   omega-3 acid ethyl esters 1 g capsule Commonly known as:  LOVAZA Take 1 capsule (1 g total) by mouth daily. For high fat What changed:  additional instructions  Indication:  High Amount of Triglycerides in the Blood   Oxcarbazepine 300 MG tablet Commonly known as:  TRILEPTAL Take 1 tablet (300 mg) in the morning & 3 tablets (600 mg): For mood stabilization  Indication:  Manic-Depression   sertraline 25 MG tablet Commonly known as:  ZOLOFT Take 1 tablet (25 mg total) by mouth at bedtime. For depression What changed:  medication strength  how much to take  additional instructions  Indication:  Major Depressive Disorder      Follow-up Information    [facility] Follow up on 02/23/2016.   Why:  Wednesday at 5:30 with [facility] information: [address] [place] [address] [phone]          Follow-up recommendations:  Activity:  As tolerated Diet: As recommended  by your primary care doctor.  Keep all scheduled follow-up appointments as recommended.  Comments: Patient is instructed prior to discharge to: Take all medications as prescribed by his/her mental healthcare provider. Report any adverse effects and or reactions from the medicines to his/her outpatient provider promptly. Patient has been instructed & cautioned: To not engage in alcohol and or illegal drug use while on prescription medicines. In the event of worsening symptoms, patient is instructed to call the crisis hotline, 911 and or go to the nearest ED for appropriate evaluation and treatment of symptoms. To follow-up with his/her primary care provider for your other medical issues, concerns and or health care needs.   Signed: Sanjuana Kava, NP, PMHNP-BC 02/23/2016, 9:01 AM

## 2016-02-23 NOTE — Progress Notes (Signed)
  [facility] Discharge Plan :  Will you be returning to the same living situation after discharge:  Yes,  home At discharge, do you have transportation home?: Yes,  family Do you have the ability to pay for your medications: Yes,  family  Release of information consent forms completed and in the chart;  Patient's signature needed at discharge.  Patient to Follow up at: Follow-up Information    [facility] Follow [facility] on 02/23/2016.   Why:  [facility] Contact information: [address] [place] [address] [phone]           Next level of care provider has [facility] to [facility] Link:no  Safety Planning and Suicide Prevention discussed: Yes,  yes  Have you used any form of tobacco in the last 30 days? (Cigarettes, Smokeless Tobacco, Cigars, and/or Pipes): No  Has patient been referred to the Quitline?: N/A patient is not a smoker  Patient has been referred for addiction treatment: N/A  Cheryl Cohen 02/23/2016, 2:12 PM

## 2016-02-28 ENCOUNTER — Ambulatory Visit (INDEPENDENT_AMBULATORY_CARE_PROVIDER_SITE_OTHER): Payer: Self-pay

## 2016-02-28 DIAGNOSIS — Z3202 Encounter for pregnancy test, result negative: Secondary | ICD-10-CM

## 2016-02-28 DIAGNOSIS — Z3200 Encounter for pregnancy test, result unknown: Secondary | ICD-10-CM

## 2016-02-28 LAB — POCT PREGNANCY, URINE: Preg Test, Ur: NEGATIVE

## 2016-02-28 NOTE — Progress Notes (Signed)
Patient presented to office today for a pregnancy test. Test confirm she is not pregnant at this time.When test results were confirm with patient. Patient stated she has to be pregnant she stated naming all the symptoms she had. Patient states I took a test at home and it was negative and I just lost may twin babies in September. I explain to patient at this time the test is negative and if she wanted to wait a couple more weeks and take another home pregnancy test and if it is positive she should [facility]. Patient became confused and upset and left out of the office.

## 2016-03-03 ENCOUNTER — Inpatient Hospital Stay (HOSPITAL_COMMUNITY)
Admission: AD | Admit: 2016-03-03 | Discharge: 2016-03-03 | Disposition: A | Discharge status: Home / Self Care | Payer: BLUE CROSS/BLUE SHIELD | Source: Ambulatory Visit | Attending: Obstetrics and Gynecology | Admitting: Obstetrics and Gynecology

## 2016-03-03 ENCOUNTER — Encounter (HOSPITAL_COMMUNITY): Payer: Self-pay

## 2016-03-03 DIAGNOSIS — Z87891 Personal history of nicotine dependence: Secondary | ICD-10-CM | POA: Insufficient documentation

## 2016-03-03 DIAGNOSIS — R140 Abdominal distension (gaseous): Secondary | ICD-10-CM

## 2016-03-03 DIAGNOSIS — N912 Amenorrhea, unspecified: Secondary | ICD-10-CM | POA: Diagnosis not present

## 2016-03-03 DIAGNOSIS — Z880 Allergy status to penicillin: Secondary | ICD-10-CM | POA: Insufficient documentation

## 2016-03-03 DIAGNOSIS — Z3202 Encounter for pregnancy test, result negative: Secondary | ICD-10-CM | POA: Insufficient documentation

## 2016-03-03 DIAGNOSIS — F3163 Bipolar disorder, current episode mixed, severe, without psychotic features: Secondary | ICD-10-CM

## 2016-03-03 DIAGNOSIS — Z3042 Encounter for surveillance of injectable contraceptive: Secondary | ICD-10-CM

## 2016-03-03 LAB — POCT PREGNANCY, URINE: Preg Test, Ur: NEGATIVE

## 2016-03-03 LAB — URINALYSIS, ROUTINE W REFLEX MICROSCOPIC
BILIRUBIN URINE: NEGATIVE
GLUCOSE, UA: NEGATIVE mg/dL
Ketones, ur: NEGATIVE mg/dL
Leukocytes, UA: NEGATIVE
Nitrite: NEGATIVE
PH: 7 (ref 5.0–8.0)
Protein, ur: NEGATIVE mg/dL
SPECIFIC GRAVITY, URINE: 1.01 (ref 1.005–1.030)

## 2016-03-03 LAB — URINE MICROSCOPIC-ADD ON
BACTERIA UA: NONE SEEN
WBC, UA: NONE SEEN WBC/hpf (ref 0–5)

## 2016-03-03 NOTE — MAU Note (Signed)
Patient unsure if pregnant, having slight sharp pain in lower abdomen and back, belly bloated, breasts feel very heavy, LMP around beginning of September, took home UPT was negative last Saturday, no vaginal bleeding or discharge, had some spotting yesterday.

## 2016-03-03 NOTE — Discharge Instructions (Signed)
Hormonal Contraception Information Introduction Estrogen and progesterone (progestin) are hormones used in many forms of birth control (contraception). These two hormones make up most hormonal contraceptives. Hormonal contraceptives use either:  A combination of estrogen hormone and progesterone hormone in one of these forms:  Pill. Pills come in various combinations of active hormone pills and nonhormonal pills. Different combinations of pills may give you a period once a month, once every 3 months, or no period at all. It is important to take the pills the same time each day.  Patch. The patch is placed on the lower abdomen every week for 3 weeks. On the fourth week, the patch is not placed.  Vaginal ring. The ring is placed in the vagina and left there for 3 weeks. It is then removed for 1 week.  Progesterone alone in one of these forms:  Pill. Hormone pills are taken every day of the cycle.  Intrauterine device (IUD). The IUD is inserted during a menstrual period and removed or replaced every 5 years or sooner.  Implant. Plastic rods are placed under the skin of the upper arm. They are removed or replaced every 3 years or sooner.  Injection. The injection is given once every 90 days. Pregnancy can still occur with any of these hormonal contraceptive methods. If you have any suspicion that you might be pregnant, take a pregnancy test and talk to your health care provider. Progesterone contraceptives Progesterone-only contraceptives can prevent pregnancy by:  Blocking ovulation. This occurs in many women, but some women will continue to ovulate.  Preventing the entry of sperm into the uterus by keeping the cervical mucus thick and sticky.  Changing the lining of the uterus. This change makes it more difficult for an egg to implant. Side effects Talk to your health care provider about what side effects may affect you. If you develop persistent side effects or if the effects are  severe, talk to your health care provider.  Progesterone. Side effects of progesterone can vary. They include: missed periods, spotting, increased appetite and weight gain.  Questions to ask This information is not intended to replace advice given to you by your health care provider. Make sure you discuss any questions you have with your health care provider. Document Released: 04/09/2007 Document Revised: 12/22/2015 Document Reviewed: 09/01/2012  2017 Elsevier

## 2016-03-03 NOTE — MAU Provider Note (Signed)
History     CSN: [id number]  Arrival date and time: 03/03/16 1622   First Provider Initiated Contact with Patient 03/03/16 1802      Chief Complaint  Patient presents with  . Abdominal Pain  . Back Pain   Patient is a 25 year old G1 P0100 who had a second trimester loss of twins in June. She presents today with complaints of feeling like she is pregnant. She states she had unprotected sex in September and has not had a period since. She did get a Depo-Provera injection in November. She has had some spotting. Additionally she reports feeling like her breasts are full she has gained abdominal weight. She reports some morning nausea and increased appetite. She reports some abdominal cramping. She denies having straining to have bowel movements. And reports frequent some loose bowel movements. She states they're not like diarrhea. She is very insistent that none of these symptoms could be caused by Depo-Provera or another explanation and that they all are due to pregnancy. She presented several days ago for pregnancy test and was referred to the clinic. It was negative there. A second urine pregnancy test was performed today and is negative.    OB History    Gravida Para Term Preterm AB Living   1 1   1    0   SAB TAB Ectopic Multiple Live Births         1        Past Medical History:  Diagnosis Date  . Anxiety   . Bipolar 1 disorder (HCC)    hospitalized  3 x onset rx in 9th grade   . Depression   . Gonorrhea   . Hx of varicella   . Syphilis   . Twin pregnancy, delivered vaginally, current hospitalization 09/09/2015    Past Surgical History:  Procedure Laterality Date  . DILATION AND EVACUATION N/A 09/09/2015   Procedure: DILATATION AND EVACUATION;  Surgeon: Lavina Hammanodd Meisinger, MD;  Location: [facility];  Service: Gynecology;  Laterality: N/A;  . TYMPANOSTOMY TUBE PLACEMENT     asage 2-3 yrs    Family History  Problem Relation Age of Onset  . Diabetes Mother   . Arthritis Mother    . Hypertension Father   . Alcohol abuse Father   . Alcoholism Father     Social History  Substance Use Topics  . Smoking status: Former Smoker    Packs/day: 0.20    Types: Cigarettes  . Smokeless tobacco: Never Used     Comment: 1 cigarette a day  . Alcohol use No    Allergies:  Allergies  Allergen Reactions  . Amoxicillin Rash    Has patient had a PCN reaction causing immediate rash, facial/tongue/throat swelling, SOB or lightheadedness with hypotension: Yes Has patient had a PCN reaction causing severe rash involving mucus membranes or skin necrosis: No Has patient had a PCN reaction that required hospitalization: No Has patient had a PCN reaction occurring within the last 10 years: Yes If all of the above answers are "NO", then may proceed with Cephalosporin use.     Prescriptions Prior to Admission  Medication Sig Dispense Refill Last Dose  . B Complex-C (B-COMPLEX WITH VITAMIN C) tablet Take 1 tablet by mouth daily. Vitamin supplement     . benztropine (COGENTIN) 0.5 MG tablet Take 1 tablet (0.5 mg total) by mouth at bedtime. For prevention of drug induced tremors. 30 tablet 0   . haloperidol (HALDOL) 10 MG tablet Take 1 tablet (10 mg total)  by mouth at bedtime. For mood control 30 tablet 0   . ibuprofen (ADVIL,MOTRIN) 800 MG tablet Take 1 tablet (800 mg total) by mouth every 8 (eight) hours as needed for moderate pain. 1 tablet 0   . omega-3 acid ethyl esters (LOVAZA) 1 g capsule Take 1 capsule (1 g total) by mouth daily. For high fat     . Oxcarbazepine (TRILEPTAL) 300 MG tablet Take 1 tablet (300 mg) in the morning & 2 tablets (600 mg): For mood stabilization 90 tablet 0   . sertraline (ZOLOFT) 25 MG tablet Take 1 tablet (25 mg total) by mouth at bedtime. For depression 30 tablet 0     Review of Systems  Constitutional: Negative for chills and fever.  HENT: Negative for congestion and sore throat.   Eyes: Negative for blurred vision and double vision.  Respiratory:  Negative for cough and hemoptysis.   Cardiovascular: Negative for chest pain and palpitations.  Gastrointestinal: Negative for abdominal pain, constipation, diarrhea, heartburn, nausea and vomiting.  Skin: Negative for itching and rash.  Neurological: Negative for dizziness and headaches.   Physical Exam   Blood pressure 118/77, pulse 87, temperature 98.5 F (36.9 C), resp. rate 18, last menstrual period 12/08/2015.  Physical Exam  Constitutional: She is oriented to person, place, and time. She appears well-developed and well-nourished.  HENT:  Head: Normocephalic.  Cardiovascular: Normal rate and intact distal pulses.   Respiratory: Effort normal. No respiratory distress.  GI: Soft. Bowel sounds are normal. She exhibits no distension. There is no tenderness. There is no rebound and no guarding.  No palpable uterus or abdominal mass  Musculoskeletal: Normal range of motion. She exhibits no edema.  Neurological: She is alert and oriented to person, place, and time.  Skin: Skin is warm and dry.    MAU Course  Procedures  MDM In MAU extensive time was spent counseling patient on possible side effects of Depo-Provera. Urine pregnancy test was performed and negative. Did discuss with patient that she is not pregnant at this time patient voiced "I don't understand and having all the other symptoms". Mother is present and in the room. She reports she thinks this is due to the trauma of her twin loss and agrees that the patient is likely not pregnant.  Assessment and Plan  #1: Surveillance of Depo-Provera. Extensive counseling given on side effects of Depo-Provera including absent. Spotting and weight gain. #2: Concern for pregnancy: Urine pregnancy test was negative. Likely her concern for pregnancy symptoms status from psychiatric trauma from her twin loss. Will continue to support with psychiatric care. Mother is attempting the patient's into the heart strings program and patient states  she will see her therapist.  Ernestina Pennaicholas Dian Minahan 03/03/2016, 6:18 PM

## 2016-03-06 DIAGNOSIS — Z3202 Encounter for pregnancy test, result negative: Secondary | ICD-10-CM | POA: Diagnosis not present

## 2016-03-08 DIAGNOSIS — F319 Bipolar disorder, unspecified: Secondary | ICD-10-CM | POA: Diagnosis not present

## 2016-03-08 DIAGNOSIS — F909 Attention-deficit hyperactivity disorder, unspecified type: Secondary | ICD-10-CM | POA: Diagnosis not present

## 2016-03-09 DIAGNOSIS — Z3042 Encounter for surveillance of injectable contraceptive: Secondary | ICD-10-CM | POA: Diagnosis not present

## 2016-03-09 DIAGNOSIS — N898 Other specified noninflammatory disorders of vagina: Secondary | ICD-10-CM | POA: Diagnosis not present

## 2016-04-04 DIAGNOSIS — F909 Attention-deficit hyperactivity disorder, unspecified type: Secondary | ICD-10-CM | POA: Diagnosis not present

## 2016-04-04 DIAGNOSIS — F319 Bipolar disorder, unspecified: Secondary | ICD-10-CM | POA: Diagnosis not present

## 2016-04-07 DIAGNOSIS — Z3009 Encounter for other general counseling and advice on contraception: Secondary | ICD-10-CM | POA: Diagnosis not present

## 2016-05-08 ENCOUNTER — Ambulatory Visit: Payer: BLUE CROSS/BLUE SHIELD | Admitting: Family Medicine

## 2016-05-15 ENCOUNTER — Ambulatory Visit: Payer: BLUE CROSS/BLUE SHIELD

## 2016-05-15 DIAGNOSIS — Z111 Encounter for screening for respiratory tuberculosis: Secondary | ICD-10-CM

## 2016-05-15 NOTE — Progress Notes (Signed)
Tuberculin skin test applied to right ventral forearm. Explained how to read the test, measuring induration not just erythema; she will come into office within 48-72 hours to have test read.

## 2016-05-17 ENCOUNTER — Encounter: Payer: Self-pay | Admitting: Family Medicine

## 2016-06-20 DIAGNOSIS — Z1389 Encounter for screening for other disorder: Secondary | ICD-10-CM | POA: Diagnosis not present

## 2016-06-20 DIAGNOSIS — Z01419 Encounter for gynecological examination (general) (routine) without abnormal findings: Secondary | ICD-10-CM | POA: Diagnosis not present

## 2016-06-20 DIAGNOSIS — Z1151 Encounter for screening for human papillomavirus (HPV): Secondary | ICD-10-CM | POA: Diagnosis not present

## 2016-06-20 DIAGNOSIS — Z130 Encounter for screening for diseases of the blood and blood-forming organs and certain disorders involving the immune mechanism: Secondary | ICD-10-CM | POA: Diagnosis not present

## 2016-06-20 DIAGNOSIS — Z113 Encounter for screening for infections with a predominantly sexual mode of transmission: Secondary | ICD-10-CM | POA: Diagnosis not present

## 2016-06-20 DIAGNOSIS — Z6821 Body mass index (BMI) 21.0-21.9, adult: Secondary | ICD-10-CM | POA: Diagnosis not present

## 2016-06-20 DIAGNOSIS — Z124 Encounter for screening for malignant neoplasm of cervix: Secondary | ICD-10-CM | POA: Diagnosis not present

## 2016-06-21 DIAGNOSIS — Z124 Encounter for screening for malignant neoplasm of cervix: Secondary | ICD-10-CM | POA: Diagnosis not present

## 2016-06-23 DIAGNOSIS — F909 Attention-deficit hyperactivity disorder, unspecified type: Secondary | ICD-10-CM | POA: Diagnosis not present

## 2016-06-23 DIAGNOSIS — F319 Bipolar disorder, unspecified: Secondary | ICD-10-CM | POA: Diagnosis not present

## 2016-07-30 ENCOUNTER — Emergency Department (HOSPITAL_COMMUNITY)
Admission: EM | Admit: 2016-07-30 | Discharge: 2016-07-31 | Disposition: A | Discharge status: Home / Self Care | Payer: BLUE CROSS/BLUE SHIELD | Attending: Emergency Medicine | Admitting: Emergency Medicine

## 2016-07-30 ENCOUNTER — Encounter (HOSPITAL_COMMUNITY): Payer: Self-pay

## 2016-07-30 DIAGNOSIS — F918 Other conduct disorders: Secondary | ICD-10-CM | POA: Insufficient documentation

## 2016-07-30 DIAGNOSIS — Z87891 Personal history of nicotine dependence: Secondary | ICD-10-CM | POA: Diagnosis not present

## 2016-07-30 DIAGNOSIS — R45850 Homicidal ideations: Secondary | ICD-10-CM | POA: Insufficient documentation

## 2016-07-30 DIAGNOSIS — R4689 Other symptoms and signs involving appearance and behavior: Secondary | ICD-10-CM

## 2016-07-30 DIAGNOSIS — Z3202 Encounter for pregnancy test, result negative: Secondary | ICD-10-CM

## 2016-07-30 LAB — CBC WITH DIFFERENTIAL/PLATELET
BASOS PCT: 1 %
Basophils Absolute: 0.1 10*3/uL (ref 0.0–0.1)
Eosinophils Absolute: 0.2 10*3/uL (ref 0.0–0.7)
Eosinophils Relative: 3 %
HEMATOCRIT: 38.2 % (ref 36.0–46.0)
HEMOGLOBIN: 13.3 g/dL (ref 12.0–15.0)
LYMPHS PCT: 39 %
Lymphs Abs: 2.9 10*3/uL (ref 0.7–4.0)
MCH: 30.2 pg (ref 26.0–34.0)
MCHC: 34.8 g/dL (ref 30.0–36.0)
MCV: 86.8 fL (ref 78.0–100.0)
MONOS PCT: 12 %
Monocytes Absolute: 0.9 10*3/uL (ref 0.1–1.0)
NEUTROS ABS: 3.4 10*3/uL (ref 1.7–7.7)
Neutrophils Relative %: 45 %
Platelets: 257 10*3/uL (ref 150–400)
RBC: 4.4 MIL/uL (ref 3.87–5.11)
RDW: 13.4 % (ref 11.5–15.5)
WBC: 7.5 10*3/uL (ref 4.0–10.5)

## 2016-07-30 LAB — RAPID URINE DRUG SCREEN, HOSP PERFORMED
AMPHETAMINES: NOT DETECTED
Barbiturates: NOT DETECTED
Benzodiazepines: NOT DETECTED
Cocaine: NOT DETECTED
OPIATES: NOT DETECTED
Tetrahydrocannabinol: NOT DETECTED

## 2016-07-30 LAB — COMPREHENSIVE METABOLIC PANEL
ALT: 21 U/L (ref 14–54)
AST: 26 U/L (ref 15–41)
Albumin: 4.1 g/dL (ref 3.5–5.0)
Alkaline Phosphatase: 59 U/L (ref 38–126)
Anion gap: 8 (ref 5–15)
BILIRUBIN TOTAL: 0.7 mg/dL (ref 0.3–1.2)
BUN: 8 mg/dL (ref 6–20)
CHLORIDE: 105 mmol/L (ref 101–111)
CO2: 23 mmol/L (ref 22–32)
CREATININE: 0.88 mg/dL (ref 0.44–1.00)
Calcium: 9.4 mg/dL (ref 8.9–10.3)
GFR calc Af Amer: >60 mL/min (ref >60)
GLUCOSE: 90 mg/dL (ref 65–99)
POTASSIUM: 3.7 mmol/L (ref 3.5–5.1)
Sodium: 136 mmol/L (ref 135–145)
Total Protein: 6.6 g/dL (ref 6.5–8.1)

## 2016-07-30 LAB — URINALYSIS, ROUTINE W REFLEX MICROSCOPIC
Bilirubin Urine: NEGATIVE
GLUCOSE, UA: NEGATIVE mg/dL
HGB URINE DIPSTICK: NEGATIVE
KETONES UR: NEGATIVE mg/dL
Leukocytes, UA: NEGATIVE
Nitrite: NEGATIVE
PH: 5 (ref 5.0–8.0)
PROTEIN: NEGATIVE mg/dL
Specific Gravity, Urine: 1.024 (ref 1.005–1.030)

## 2016-07-30 LAB — ETHANOL: Alcohol, Ethyl (B): <5 mg/dL (ref <5)

## 2016-07-30 LAB — SALICYLATE LEVEL: Salicylate Lvl: <7 mg/dL (ref 2.8–30.0)

## 2016-07-30 LAB — ACETAMINOPHEN LEVEL: Acetaminophen (Tylenol), Serum: <10 ug/mL — ABNORMAL LOW (ref 10–30)

## 2016-07-30 LAB — POC URINE PREG, ED: PREG TEST UR: NEGATIVE

## 2016-07-30 MED ORDER — ZIPRASIDONE MESYLATE 20 MG IM SOLR
10.0000 mg | Freq: Once | INTRAMUSCULAR | Status: AC
  Administered 2016-07-30: 10 mg via INTRAMUSCULAR
  Filled 2016-07-30: qty 20

## 2016-07-30 MED ORDER — SERTRALINE HCL 50 MG PO TABS
50.0000 mg | ORAL_TABLET | Freq: Every day | ORAL | Status: DC
Start: 2016-07-30 — End: 2016-07-31
  Administered 2016-07-30: 50 mg via ORAL
  Filled 2016-07-30: qty 1

## 2016-07-30 MED ORDER — B COMPLEX-C PO TABS
1.0000 | ORAL_TABLET | Freq: Every day | ORAL | Status: DC
  Filled 2016-07-30: qty 1

## 2016-07-30 MED ORDER — LAMOTRIGINE 100 MG PO TABS
100.0000 mg | ORAL_TABLET | Freq: Every day | ORAL | Status: DC
  Filled 2016-07-30: qty 1

## 2016-07-30 MED ORDER — STERILE WATER FOR INJECTION IJ SOLN
INTRAMUSCULAR | Status: AC
  Administered 2016-07-30: 11:00:00
  Filled 2016-07-30: qty 10

## 2016-07-30 MED ORDER — LORAZEPAM 1 MG PO TABS
1.0000 mg | ORAL_TABLET | Freq: Three times a day (TID) | ORAL | Status: DC | PRN
  Administered 2016-07-30 (×2): 1 mg via ORAL
  Filled 2016-07-30 (×2): qty 1

## 2016-07-30 MED ORDER — ONDANSETRON HCL 4 MG PO TABS
4.0000 mg | ORAL_TABLET | Freq: Three times a day (TID) | ORAL | Status: DC | PRN
  Administered 2016-07-30: 4 mg via ORAL
  Filled 2016-07-30: qty 1

## 2016-07-30 MED ORDER — IBUPROFEN 400 MG PO TABS
600.0000 mg | ORAL_TABLET | Freq: Three times a day (TID) | ORAL | Status: DC | PRN
  Administered 2016-07-30: 600 mg via ORAL
  Filled 2016-07-30 (×2): qty 1

## 2016-07-30 NOTE — ED Notes (Signed)
Sitter at bedside pt sleeping at this time

## 2016-07-30 NOTE — Progress Notes (Signed)
Patient has been referred to the inpatient treatment facilities: [facility], [facility], [address], [place], and [place].  At capacity: [facility], [place], [facility], [facility].  CSW in disposition will continue to seek placement.  Melbourne Abts, LCSWA Disposition staff 07/30/2016 12:42 PM

## 2016-07-30 NOTE — ED Notes (Addendum)
Pt was punching the siderail on the bed. When asked by this RN what she was doing, she stopped and said she was fine, then started to respond to internal stimuli.  Sitter states she seems to be acting out with the TV.  Sitter sts will turn TV to something more positive.

## 2016-07-30 NOTE — ED Provider Notes (Signed)
Notified by RN that pt now demanding to leave. She has been intermittently aggressive and continues to have HI towards mother. She has shown bizarre behavior in room including soliing herself and stripping naked. I feel she is high risk of dangerousness to herself and others due to decompensated bipolar disorder. IVC placed.   Shaune Pollack, MD 07/30/16 [phone]

## 2016-07-30 NOTE — ED Notes (Addendum)
After scanning and opening meds, this RN went into pt's room to give meds.  Pt had her eyes closed and rolled back while talking, so this RN turned on the lights. The patient spoke with pressured speech about how great Sundays are.  After redirecting her to the meds, this RN answered pt's questions about which medications were which.  Pt mistakenly thought the lamotrigine was ativan and refused to take it, but accepted the med cup and took the ativan and sertraline.  She put the lamotrigine back in the cup and gave it back to this RN.  Pt then started to talk about all the plans she had for the next year.  So as not to undo any rapport, this RN listened to the patient and offered the medication a couple more times throughout the conversation; she still refused. Charge nurse aware.

## 2016-07-30 NOTE — ED Notes (Signed)
Staffing Office advised is calling [facility] for sitter from [facility].

## 2016-07-30 NOTE — ED Notes (Signed)
Pt in room speaking to persons who are not in room, pt becoming agitated at times.  Walked in to room asked pt whom she was yelling at pt replies "my people" pt remains in room within ear shot of nurses station

## 2016-07-30 NOTE — ED Notes (Signed)
Pt lying on bed w/eyes closed. Respirations even, unlabored. 

## 2016-07-30 NOTE — ED Notes (Signed)
Patient is resting comfortably. Pt asleep
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
## 2016-07-30 NOTE — ED Notes (Addendum)
Pt transported to F8 via stretcher from Pod A 10. Pt noted to be sleepy. Warm blankets and pillow given. Pt requested for door to room be closed. Advised may nearly close door - voiced understanding. Copy of Medical Clearance Pt Policy form placed on counter in room. Pt's belongings inventoried by [employer], Charity fundraiser - 1 labeled belongings bag placed in lower cabinet - LOCKED - valuables envelope given to security. Pt had removed her ID bracelet in Pod A10 - bracelet placed on counter and will re-apply when pt awakens and is able to verify name/DOB.

## 2016-07-30 NOTE — ED Notes (Signed)
Pt noted to be talking loudly to herself - at times, she is crying w/no tears, laughing and cursing. Pt cooperative w/staff.

## 2016-07-30 NOTE — ED Triage Notes (Signed)
Pt arrives appears anxious and scattered

## 2016-07-30 NOTE — ED Notes (Signed)
Cheryl Cohen, Cheryl Cohen, aware pt now located in [employer].

## 2016-07-30 NOTE — ED Provider Notes (Signed)
[facility]ER[facility]ENCY DEPT Provider Note   CSN: [id number] Arrival date & time: 07/30/16  4098     History   Chief Complaint Chief Complaint  Patient presents with  . Possible Pregnancy    pt states increased fatigue and urination with back aches, pt unable to remember her last menstrual cycle     HPI Cheryl Cohen is a 26 y.o. female who presents for a pregnancy test. PMH significant for bipolar d/o, hx of miscarriage, hx of possible pregnancy complaints. She states that she was in an argument with her mother this morning. She states that her mother was "acting irrational" and called the police on her. [employer] state the mother called the police because the patient was threatening to slit her her throat (the mother's) and blow the house up. She was also throwing things at the wall causing holes and at her mother. The patient denies all of this and her main concern is that she thinks she is pregnant. She reports being off birth control since January and is sexually active. She reports taking a pregnancy test at home today which was positive. She denies fever, chills, chest pain, SOB, abdominal pain, N/V/D, constipation, dysuria, vaginal discharge or bleeding. She denies SI/HI, AVH. She states she has been taking her psych medicines as prescribed and vitamins for her "every day well-being".  HPI  Past Medical History:  Diagnosis Date  . Anxiety   . Bipolar 1 disorder (HCC)    hospitalized  3 x onset rx in 9th grade   . Depression   . Gonorrhea   . Hx of varicella   . Syphilis   . Twin pregnancy, delivered vaginally, current hospitalization 09/09/2015    Patient Active Problem List   Diagnosis Date Noted  . Bipolar disorder, curr episode mixed, severe, w/o psychotic features (HCC) 02/21/2016  . Fetal demise, greater than 22 weeks, antepartum 09/08/2015  . Suicidal ideation 10/06/2013  . Family history of diabetes mellitus type II 01/11/2012    Past Surgical History:  Procedure  Laterality Date  . DILATION AND EVACUATION N/A 09/09/2015   Procedure: DILATATION AND EVACUATION;  Surgeon: [name], MD;  Location: [facility];  Service: Gynecology;  Laterality: N/A;  . TYMPANOSTOMY TUBE PLACEMENT     asage 2-3 yrs    OB History    Gravida Para Term Preterm AB Living   0   SAB TAB Ectopic Multiple Live Births         1         Home Medications    Prior to Admission medications   Medication Sig Start Date End Date Taking? Authorizing Provider  B Complex-C (B-COMPLEX WITH VITAMIN C) tablet Take 1 tablet by mouth daily. Vitamin supplement 02/23/16  Yes Sanjuana Kava, NP  lamoTRIgine (LAMICTAL) 100 MG tablet Take 100 mg by mouth at bedtime. 07/07/16  Yes Historical Provider, MD  sertraline (ZOLOFT) 50 MG tablet Take 50 mg by mouth at bedtime. 06/18/16  Yes Historical Provider, MD  benztropine (COGENTIN) 0.5 MG tablet Take 1 tablet (0.5 mg total) by mouth at bedtime. For prevention of drug induced tremors. Patient not taking: Reported on 07/30/2016 02/23/16   Sanjuana Kava, NP  haloperidol (HALDOL) 10 MG tablet Take 1 tablet (10 mg total) by mouth at bedtime. For mood control Patient not taking: Reported on 07/30/2016 02/23/16   Sanjuana Kava, NP  ibuprofen (ADVIL,MOTRIN) 800 MG tablet Take 1 tablet (800 mg total) by  mouth every 8 (eight) hours as needed for moderate pain. Patient not taking: Reported on 07/30/2016 02/23/16   Sanjuana Kava, NP  Oxcarbazepine (TRILEPTAL) 300 MG tablet Take 1 tablet (300 mg) in the morning & 2 tablets (600 mg): For mood stabilization Patient not taking: Reported on 07/30/2016 02/23/16   Sanjuana Kava, NP  sertraline (ZOLOFT) 25 MG tablet Take 1 tablet (25 mg total) by mouth at bedtime. For depression Patient not taking: Reported on 07/30/2016 02/23/16   Sanjuana Kava, NP    Family History Family History  Problem Relation Age of Onset  . Diabetes Mother   . Arthritis Mother   . Hypertension Father   . Alcohol abuse Father   .  Alcoholism Father     Social History Social History  Substance Use Topics  . Smoking status: Former Smoker    Packs/day: 0.20    Types: Cigarettes  . Smokeless tobacco: Never Used     Comment: 1 cigarette a day  . Alcohol use No     Allergies   Amoxicillin   Review of Systems Review of Systems  Constitutional: Negative for chills and fever.  Respiratory: Negative for shortness of breath.   Cardiovascular: Negative for chest pain.  Gastrointestinal: Negative for abdominal pain, diarrhea, nausea and vomiting.  Genitourinary: Negative for dysuria, vaginal bleeding and vaginal discharge.  Psychiatric/Behavioral: Negative for agitation, behavioral problems, dysphoric mood, hallucinations, self-injury and suicidal ideas. The patient is not nervous/anxious.   All other systems reviewed and are negative.   Physical Exam Updated Vital Signs Ht  (1.702 m)   Wt 63.5 kg   BMI 21.93 kg/m   Physical Exam  Constitutional: She is oriented to person, place, and time. She appears well-developed and well-nourished. No distress.  Calm, cooperative. Odd affect - talks with eyes closed and smiles  HENT:  Head: Normocephalic and atraumatic.  Eyes: Conjunctivae are normal. Pupils are equal, round, and reactive to light. Right eye exhibits no discharge. Left eye exhibits no discharge. No scleral icterus.  Neck: Normal range of motion.  Cardiovascular: Normal rate and regular rhythm.  Exam reveals no gallop and no friction rub.   No murmur heard. Pulmonary/Chest: Effort normal and breath sounds normal. No respiratory distress. She has no wheezes. She has no rales. She exhibits no tenderness.  Abdominal: Soft. Bowel sounds are normal. She exhibits no distension and no mass. There is no tenderness. There is no rebound and no guarding. No hernia.  Neurological: She is alert and oriented to person, place, and time.  Skin: Skin is warm and dry.  Psychiatric: Her speech is normal. She  expresses impulsivity. She expresses no homicidal and no suicidal ideation. She expresses no suicidal plans and no homicidal plans.  Nursing note and vitals reviewed.    ED Treatments / Results  Labs (all labs ordered are listed, but only abnormal results are displayed) Labs Reviewed  URINALYSIS, ROUTINE W REFLEX MICROSCOPIC - Abnormal; Notable for the following:       Result Value   APPearance HAZY (*)    All other components within normal limits  CBC WITH DIFFERENTIAL/PLATELET  RAPID URINE DRUG SCREEN, HOSP PERFORMED  COMPREHENSIVE METABOLIC PANEL  ETHANOL  SALICYLATE LEVEL  ACETAMINOPHEN LEVEL  POC URINE PREG, ED    EKG  EKG Interpretation None       Radiology No results found.  Procedures Procedures (including critical care time)  Medications Ordered in ED Medications - No data to display   Initial  Impression / Assessment and Plan / ED Course  I have reviewed the triage vital signs and the nursing notes.  Pertinent labs & imaging results that were available during my care of the patient were reviewed by me and considered in my medical decision making (see chart for details).  26 year old female with aggressive behavior and homicidal threats towards her mother. Vitals are normal. Pregnancy test is negative. Labs are unremarkable. UDS clean. Advised patient of results and she was visibly upset. Pt medically cleared for TTS consult for homicidal ideation.  Final Clinical Impressions(s) / ED Diagnoses   Final diagnoses:  Encounter for pregnancy test with result negative  Homicidal ideation  Aggressive behavior    New Prescriptions Current Discharge Medication List       Bethel Born, PA-C 07/30/16 1009    Jacalyn Lefevre, MD 07/30/16 1100

## 2016-07-30 NOTE — ED Notes (Signed)
Dr Erma Heritage aware of pt's behavior. 24-hour IVC paperwork completed and notarized. IVC papers completed, notarized and faxed to Magistrate. Pt changed into paper scrubs from gown - RN assisted.

## 2016-07-30 NOTE — ED Notes (Signed)
Pt now awake - talking loudly - stating "I need to get out of here." Pt aware she is at [facility] and that she was BIB [employer]. Encouraged pt to stay. Pt's stretcher changed out for bed for comfort. Pt states she is hungry - [place] sandwich, crackers, graham crackers, peanut butter, and water given as requested.

## 2016-07-30 NOTE — ED Notes (Signed)
Staffing is aware of sitter need and has been in contact with [facility] who is working on Financial planner.

## 2016-07-30 NOTE — ED Notes (Addendum)
Fleet Contras, Consulting civil engineer, advised Comptroller unavailable per [employer].

## 2016-07-30 NOTE — ED Notes (Signed)
Snack bag given ([place] sandwich, applesauce, graham crackers, peanut butter, cookies) along with Sprite Zero.

## 2016-07-30 NOTE — ED Notes (Signed)
Magistrate has IVC paperwork.

## 2016-07-31 DIAGNOSIS — Z9114 Patient's other noncompliance with medication regimen: Secondary | ICD-10-CM | POA: Diagnosis not present

## 2016-07-31 DIAGNOSIS — E559 Vitamin D deficiency, unspecified: Secondary | ICD-10-CM | POA: Diagnosis not present

## 2016-07-31 DIAGNOSIS — F319 Bipolar disorder, unspecified: Secondary | ICD-10-CM | POA: Diagnosis not present

## 2016-07-31 DIAGNOSIS — F312 Bipolar disorder, current episode manic severe with psychotic features: Secondary | ICD-10-CM | POA: Diagnosis not present

## 2016-07-31 DIAGNOSIS — I10 Essential (primary) hypertension: Secondary | ICD-10-CM | POA: Diagnosis not present

## 2016-07-31 NOTE — ED Notes (Signed)
[name] called for an update on patient's current behavior patterns and if she was appropriate for their facility.  This RN relayed there was no hypersexual behavior, but that patient was prone to bouts of singing but was easily redirected.

## 2016-07-31 NOTE — ED Triage Notes (Signed)
PT escorted to shower by sitter.

## 2016-07-31 NOTE — ED Notes (Addendum)
Fax failed to send to [employer].

## 2016-07-31 NOTE — ED Notes (Signed)
Called [facility] to advise on fax not sending.  Gave another fax number, [phone].  Fax appears to be sending.

## 2016-07-31 NOTE — ED Notes (Signed)
[name] will take the patient today after 10:00 am.  Need copy of IVC paperwork.

## 2016-07-31 NOTE — ED Provider Notes (Signed)
Patient accepted to [facility] by Dr. Robet Leu. I personally evaluated the patient prior to transfer who had no complaints or acute changes. Appropriate for transfer. EMTALA performed.   Nira Conn, MD 07/31/16 [phone]

## 2016-07-31 NOTE — ED Notes (Signed)
Patient singing the national anthem.  Redirected by sitter and reminded that it is night time.

## 2016-07-31 NOTE — ED Notes (Signed)
IVC paperwork faxed to [facility] at [phone].

## 2016-07-31 NOTE — ED Notes (Signed)
[facility]'s fax line is reading busy.  Fax set to redial.

## 2016-07-31 NOTE — Progress Notes (Signed)
Cheryl Cohen at [facility] reports the pt has been accepted contingent upon the pt being IVC'd due to psychosis and erratic behaviors in the ED. Spoke with [place], RN who states the pt is IVC'd and she will update the chart to reflect that the pt is IVC'd vs VOL.   Call to report to [facility] is [phone]. Pt accepted by Dr. Betti Cruz and able to arrive anytime after 10am on 07/31/16. Cheryl Cohen has requested a copy of the IVC faxed to [phone]. RN advised to fax IVC to [facility].   Princess Bruins, MSW, LCSWA TTS Specialist [phone]

## 2016-07-31 NOTE — ED Notes (Addendum)
Pt drumming on side rails and humming/singing.  She does not seem to be disturbing the other patients at this time.

## 2016-08-03 DIAGNOSIS — I10 Essential (primary) hypertension: Secondary | ICD-10-CM | POA: Diagnosis not present

## 2016-08-05 DIAGNOSIS — F312 Bipolar disorder, current episode manic severe with psychotic features: Secondary | ICD-10-CM | POA: Diagnosis not present

## 2016-08-06 DIAGNOSIS — F312 Bipolar disorder, current episode manic severe with psychotic features: Secondary | ICD-10-CM | POA: Diagnosis not present

## 2016-08-07 DIAGNOSIS — F312 Bipolar disorder, current episode manic severe with psychotic features: Secondary | ICD-10-CM | POA: Diagnosis not present

## 2016-08-08 DIAGNOSIS — F312 Bipolar disorder, current episode manic severe with psychotic features: Secondary | ICD-10-CM | POA: Diagnosis not present

## 2016-08-09 DIAGNOSIS — F312 Bipolar disorder, current episode manic severe with psychotic features: Secondary | ICD-10-CM | POA: Diagnosis not present

## 2016-08-10 DIAGNOSIS — F312 Bipolar disorder, current episode manic severe with psychotic features: Secondary | ICD-10-CM | POA: Diagnosis not present

## 2016-08-11 DIAGNOSIS — F312 Bipolar disorder, current episode manic severe with psychotic features: Secondary | ICD-10-CM | POA: Diagnosis not present

## 2016-08-12 DIAGNOSIS — F312 Bipolar disorder, current episode manic severe with psychotic features: Secondary | ICD-10-CM | POA: Diagnosis not present

## 2016-08-13 DIAGNOSIS — F312 Bipolar disorder, current episode manic severe with psychotic features: Secondary | ICD-10-CM | POA: Diagnosis not present

## 2016-08-14 DIAGNOSIS — F312 Bipolar disorder, current episode manic severe with psychotic features: Secondary | ICD-10-CM | POA: Diagnosis not present

## 2016-08-15 DIAGNOSIS — F312 Bipolar disorder, current episode manic severe with psychotic features: Secondary | ICD-10-CM | POA: Diagnosis not present

## 2016-08-16 DIAGNOSIS — F312 Bipolar disorder, current episode manic severe with psychotic features: Secondary | ICD-10-CM | POA: Diagnosis not present

## 2016-08-17 DIAGNOSIS — F312 Bipolar disorder, current episode manic severe with psychotic features: Secondary | ICD-10-CM | POA: Diagnosis not present

## 2016-08-18 DIAGNOSIS — F312 Bipolar disorder, current episode manic severe with psychotic features: Secondary | ICD-10-CM | POA: Diagnosis not present

## 2016-08-22 DIAGNOSIS — F319 Bipolar disorder, unspecified: Secondary | ICD-10-CM | POA: Diagnosis not present

## 2016-08-22 DIAGNOSIS — N925 Other specified irregular menstruation: Secondary | ICD-10-CM | POA: Diagnosis not present

## 2016-08-24 ENCOUNTER — Ambulatory Visit: Payer: Self-pay | Admitting: Family Medicine

## 2016-08-24 DIAGNOSIS — F319 Bipolar disorder, unspecified: Secondary | ICD-10-CM | POA: Diagnosis not present

## 2016-08-24 DIAGNOSIS — F909 Attention-deficit hyperactivity disorder, unspecified type: Secondary | ICD-10-CM | POA: Diagnosis not present

## 2016-08-29 NOTE — Progress Notes (Deleted)
No chief complaint on file.   HPI: Cheryl Cohen 26 y.o. come in for  peroblenm visit  But last seen 2016 for bipolar disease and  Has been  Had a number or ed visits related to bipolar and depression   Last  11 17  On 4 29 to ed for  Neg preg tets and had homocidal aggressive  Sx  ROS: See pertinent positives and negatives per HPI.  Past Medical History:  Diagnosis Date  . Anxiety   . Bipolar 1 disorder (HCC)    hospitalized  3 x onset rx in 9th grade   . Depression   . Gonorrhea   . Hx of varicella   . Syphilis   . Twin pregnancy, delivered vaginally, current hospitalization 09/09/2015    Family History  Problem Relation Age of Onset  . Diabetes Mother   . Arthritis Mother   . Hypertension Father   . Alcohol abuse Father   . Alcoholism Father     Social History   Social History  . Marital status: Single    Spouse name: N/A  . Number of children: N/A  . Years of education: N/A   Social History Main Topics  . Smoking status: Former Smoker    Packs/day: 0.20    Types: Cigarettes  . Smokeless tobacco: Never Used     Comment: 1 cigarette a day  . Alcohol use No  . Drug use: No  . Sexual activity: No   Other Topics Concern  . 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 B Complex-C (B-COMPLEX WITH VITAMIN C) tablet Take 1 tablet by mouth daily. Vitamin supplement    . lamoTRIgine (LAMICTAL) 100 MG tablet Take 100 mg by mouth at bedtime.  1  . sertraline (ZOLOFT) 50 MG tablet Take 50 mg by mouth at bedtime.  1   No facility-administered medications prior to visit.      EXAM:  There were no vitals taken for this visit.  There is no height or weight on file to calculate BMI.  GENERAL: vitals reviewed and listed  above, alert, oriented, appears well hydrated and in no acute distress HEENT: atraumatic, conjunctiva  clear, no obvious abnormalities on inspection of external nose and ears OP : no lesion edema or exudate  NECK: no obvious masses on inspection palpation  LUNGS: clear to auscultation bilaterally, no wheezes, rales or rhonchi, good air movement CV: HRRR, no clubbing cyanosis or  peripheral edema nl cap refill  MS: moves all extremities without noticeable focal  abnormality PSYCH: pleasant and cooperative, no obvious depression or anxiety Lab Results  Component Value Date   WBC 7.5 07/30/2016   HGB 13.3 07/30/2016   HCT 38.2 07/30/2016   PLT 257 07/30/2016   GLUCOSE 90 07/30/2016   CHOL 127 02/22/2016   TRIG 39 02/22/2016   HDL 57 02/22/2016   LDLCALC 62 02/22/2016   ALT 21 07/30/2016   AST 26 07/30/2016   NA 136 07/30/2016   K 3.7 07/30/2016   CL 105 07/30/2016   CREATININE 0.88 07/30/2016   BUN 8 07/30/2016   CO2 23 07/30/2016  TSH 0.936 02/22/2016   HGBA1C 5.5 02/22/2016   BP Readings from Last 3 Encounters:  07/31/16 110/60  03/03/16 118/77  02/20/16 104/68    ASSESSMENT AND PLAN:  Discussed the following assessment and plan:  No diagnosis found.  -Patient advised to return or notify health care team  if  new concerns arise.  There are no Patient Instructions on file for this visit.   Neta Mends. Panosh M.D.

## 2016-08-30 ENCOUNTER — Ambulatory Visit: Payer: Self-pay | Admitting: Internal Medicine

## 2016-08-31 DIAGNOSIS — F909 Attention-deficit hyperactivity disorder, unspecified type: Secondary | ICD-10-CM | POA: Diagnosis not present

## 2016-08-31 DIAGNOSIS — F319 Bipolar disorder, unspecified: Secondary | ICD-10-CM | POA: Diagnosis not present

## 2016-09-01 DIAGNOSIS — F909 Attention-deficit hyperactivity disorder, unspecified type: Secondary | ICD-10-CM | POA: Diagnosis not present

## 2016-09-01 DIAGNOSIS — F319 Bipolar disorder, unspecified: Secondary | ICD-10-CM | POA: Diagnosis not present

## 2016-09-04 ENCOUNTER — Encounter: Payer: Self-pay | Admitting: Internal Medicine

## 2016-09-04 ENCOUNTER — Ambulatory Visit (INDEPENDENT_AMBULATORY_CARE_PROVIDER_SITE_OTHER): Payer: BLUE CROSS/BLUE SHIELD | Admitting: Internal Medicine

## 2016-09-04 VITALS — BP 110/70 | HR 84 | Temp 99.0°F | Ht 67.0 in | Wt 154.2 lb

## 2016-09-04 DIAGNOSIS — N912 Amenorrhea, unspecified: Secondary | ICD-10-CM | POA: Diagnosis not present

## 2016-09-04 DIAGNOSIS — A530 Latent syphilis, unspecified as early or late: Secondary | ICD-10-CM | POA: Diagnosis not present

## 2016-09-04 NOTE — Progress Notes (Signed)
Chief Complaint  Patient presents with  . Abnormal Lab    syohilis     HPI: Cheryl Cohen 26 y.o. come in for   Concern about positive test ?  Seen in ed 4 29 not by med since 5 16  She is bipolar with ? Med care on lithium Lamictal respiratory and Zoloft. She states that she had a positive test for syphilis she was told from blood drawn about 3 weeks ago. It was sent to our office but not available to me at this visit. Apparently had no symptoms or rash but was told to get evaluated. When she lost her baby last year she had negative syphilis HIV testing. One partner in the last 3 months. But has had previous partners no SA for $ risk  Has had a headache today. Apparently her last period was in February and hasn't bled until today. She does have a headache today Review of record shows that she's been to the emergency room and GYN office with negative pregnancy tests.  She lives at home with mom at this time. She states she is allergic to penicillin amoxicillin breaks her out in a rash on her face. ROS: See pertinent positives and negatives per HPI. No cp sob  Is some headache today andmalaise  No rash ulcers  Gyne sx     Past Medical History:  Diagnosis Date  . Anxiety   . Bipolar 1 disorder (HCC)    hospitalized  3 x onset rx in 9th grade   . Depression   . Gonorrhea   . Hx of varicella   . Syphilis   . Twin pregnancy, delivered vaginally, IUFD stillborns 09/09/2015   IUFD  22 + weeks  still borns   [redacted]w[redacted]d     Secondary diagnosis:  Active Problems:   Fetal demise, greater than 22 weeks, antepartum   Twin pregnancy, delivered vaginally, current hospitalization  Family History  Problem Relation Age of Onset  . Diabetes Mother   . Arthritis Mother   . Hypertension Father   . Alcohol abuse Father   . Alcoholism Father     Social History   Social History  . Marital status: Single    Spouse name: N/A  . Number of children: N/A  . Years of education: N/A    Social History Main Topics  . Smoking status: Former Smoker    Packs/day: 0.20    Types: Cigarettes  . Smokeless tobacco: Never Used     Comment: 1 cigarette a day  . Alcohol use No  . Drug use: No  . Sexual activity: No   Other Topics Concern  . 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 B Complex-C (B-COMPLEX WITH VITAMIN C) tablet Take 1 tablet by mouth daily. Vitamin supplement    . sertraline (ZOLOFT) 50 MG tablet Take 50 mg by mouth at bedtime.  1  . lamoTRIgine (LAMICTAL) 100 MG tablet Take 100 mg by mouth at bedtime.  1   No facility-administered medications prior to visit.      EXAM:  BP 110/70 (BP Location:  Right Arm, Patient Position: Sitting, Cuff Size: Normal)   Pulse 84   Temp 99 F (37.2 C) (Oral)   Ht 5\' 7"  (1.702 m)   Wt 154 lb 3.2 oz (69.9 kg)   BMI 24.15 kg/m   Body mass index is 24.15 kg/m.  GENERAL: vitals reviewed and listed above, alert, oriented, appears well hydrated and in no acute distress HEENT: atraumatic, conjunctiva  clear, no obvious abnormalities on inspection of external nose and ears OP : no lesion edema or exudate  NECK: no obvious masses on inspection palpation  LUNGS: clear to auscultation bilaterally, no wheezes, rales or rhonchi, good air movement CV: HRRR, no clubbing cyanosis or  peripheral edema nl cap refill  Abdomen:  Sof,t normal bowel sounds without hepatosplenomegaly, no guarding rebound or masses no CVA tenderness protuberant but no  Masses felt  MS: moves all extremities without noticeable focal  abnormality PSYCH: pleasant and cooperative,  Lab Results  Component Value Date   WBC 7.5 07/30/2016   HGB 13.3 07/30/2016   HCT 38.2 07/30/2016   PLT 257 07/30/2016    GLUCOSE 90 07/30/2016   CHOL 127 02/22/2016   TRIG 39 02/22/2016   HDL 57 02/22/2016   LDLCALC 62 02/22/2016   ALT 21 07/30/2016   AST 26 07/30/2016   NA 136 07/30/2016   K 3.7 07/30/2016   CL 105 07/30/2016   CREATININE 0.88 07/30/2016   BUN 8 07/30/2016   CO2 23 07/30/2016   TSH 0.936 02/22/2016   HGBA1C 5.5 02/22/2016   BP Readings from Last 3 Encounters:  09/04/16 110/70  07/31/16 110/60  03/03/16 118/77    ASSESSMENT AND PLAN:  Discussed the following assessment and plan:  Positive serological reaction for syphilis - by report  rpr neg last june  will repeat wth vdrl. - Plan: RPR, VDRL, Serum, hCG, quantitative, pregnancy, CBC with Differential/Platelet, HIV antibody, TSH, T4, free, Fluorescent treponemal ab(fta)-IgG-bld  Amenorrhea - just started bleeding today  - Plan: RPR, VDRL, Serum, hCG, quantitative, pregnancy, CBC with Differential/Platelet, HIV antibody, TSH, T4, free Difficult to delineate accurate history but it sounds like she had a positive screening test that I don't have the results from and she is felt that she's pregnant despite 2 negative pregnancy tests seen in the emergency room in the OB/GYN. She is bleeding today with a heavy.. Of note it has been a year since  delivery of twin stillborns  June 8?  Of note in her past history it is recorded that she's had syphilis in the past. But I don't have records of treatment or follow-up. And last screen in June 17 was negative.  She is allergic to penicillin with rash and swelling according to her so would need alternative therapy may have to involve infectious disease a health department. Plan labs today RPR and VDRL with HIV repeat CBC thyroid etc. Implants follow-up. Adequate treatment. -Patient advised to return or notify health care team  if  new concerns arise.  Patient Instructions   Unfortunately I don't have a copy of the lab results but I agree that is an important thing to evaluate and get treated  as soon as possible. We are rgetting blood work today and then make a plan for treatment. And sending this as urgent .  May need to get infectious disease people to advise the best antibiotic for  You.  We have checking blood work blood pregnancy test and syphilis test. Usually treated with penicillin but sine you are allergic  will need to be  Using a different medication .    Syphilis Test Why am I having this test? Syphilis is an infectious disease caused by a type of bacteria called Treponema pallidum. It is most commonly spread through sexual contact. Syphilis may also spread to a fetus through the blood of the mother. You may have this test if you have symptoms of syphilis, such as a painless sore (chancre). Syphilis symptoms resemble the symptoms of many other conditions. Early symptoms may go away before new symptoms develop as the disease progresses. Untreated syphilis can lead to severe complications. Ultimately, the bacteria can damage the heart, brain, and nervous system. Pregnant women often have a syphilis test. You may also have this test if you are at risk of the disease because of:  Having a sexual partner with syphilis.  Engaging in high-risk sexual activity.  Having another sexually transmitted infection, such as gonorrhea.  What kind of sample is taken? A blood test is the most common way to test for syphilis. This requires taking a blood sample from a vein in your hand or arm. The blood test checks for antibodies to the bacteria that cause syphilis. Antibodies are proteins your body makes in response to germs and other things that can make you sick. There are two types of blood tests to check for syphilis. Both tests are necessary to make a definite diagnosis.  Nontreponemal test. ? This is usually the first test. ? This test can also detect antibodies to other proteins and may give a positive result for syphilis even if you do not have the condition (false  positive).  Treponemal test. ? This test is done if you get a positive result to the nontreponemal test. ? It tests specifically for antibodies to syphilis. ? Other conditions are not likely to cause a positive result. ? This test will not reveal whether antibodies are from a previous syphilis infection or a recent one.  If your health care provider suspects late-stage syphilis, you may have a spinal tap (lumbar puncture). This is a procedure in which a small amount of the fluid that surrounds the brain and spinal cord (cerebrospinal fluid or CSF) is removed and examined for syphilis. How do I prepare for this test? There is no preparation required for this test. What do the results mean? Your test results will be reported as either negative or positive. If the result of your syphilis test is negative, no antibodies were present at the time of the test. This could mean:  You do not have syphilis.  The antibodies have not formed yet. This can take several weeks. If it is possible that you were recently exposed to the bacteria, you may need to have the test again at a later time.  If you test positive for syphilis on the first nontreponemal test, you will most likely have the second treponemal test to confirm the diagnosis. If the result of the second test is also positive, it is likely that you have syphilis. If the result of the second test is negative, further testing may be needed to make sure you do not have syphilis. Talk to your health care provider to discuss your results, treatment options, and if necessary, the need for more tests. It is your responsibility to obtain your test results. Ask the lab or department performing the test when and how you will get your results. Talk with your health care provider if you have any questions about your results. Talk  with your health care provider to discuss your results, treatment options, and if necessary, the need for more tests. Talk with your  health care provider if you have any questions about your results. This information is not intended to replace advice given to you by your health care provider. Make sure you discuss any questions you have with your health care provider. Document Released: 04/22/2004 Document Revised: 11/24/2015 Document Reviewed: 07/14/2013 Elsevier Interactive Patient Education  2018 [employer]     WiltonWanda K. Panosh M.D.

## 2016-09-04 NOTE — Patient Instructions (Addendum)
Unfortunately I don't have a copy of the lab results but I agree that is an important thing to evaluate and get treated as soon as possible. We are rgetting blood work today and then make a plan for treatment. And sending this as urgent .  May need to get infectious disease people to advise the best antibiotic for  You.  We have checking blood work blood pregnancy test and syphilis test. Usually treated with penicillin but sine you are allergic will need to be  Using a different medication .    Syphilis Test Why am I having this test? Syphilis is an infectious disease caused by a type of bacteria called Treponema pallidum. It is most commonly spread through sexual contact. Syphilis may also spread to a fetus through the blood of the mother. You may have this test if you have symptoms of syphilis, such as a painless sore (chancre). Syphilis symptoms resemble the symptoms of many other conditions. Early symptoms may go away before new symptoms develop as the disease progresses. Untreated syphilis can lead to severe complications. Ultimately, the bacteria can damage the heart, brain, and nervous system. Pregnant women often have a syphilis test. You may also have this test if you are at risk of the disease because of:  Having a sexual partner with syphilis.  Engaging in high-risk sexual activity.  Having another sexually transmitted infection, such as gonorrhea.  What kind of sample is taken? A blood test is the most common way to test for syphilis. This requires taking a blood sample from a vein in your hand or arm. The blood test checks for antibodies to the bacteria that cause syphilis. Antibodies are proteins your body makes in response to germs and other things that can make you sick. There are two types of blood tests to check for syphilis. Both tests are necessary to make a definite diagnosis.  Nontreponemal test. ? This is usually the first test. ? This test can also detect antibodies  to other proteins and may give a positive result for syphilis even if you do not have the condition (false positive).  Treponemal test. ? This test is done if you get a positive result to the nontreponemal test. ? It tests specifically for antibodies to syphilis. ? Other conditions are not likely to cause a positive result. ? This test will not reveal whether antibodies are from a previous syphilis infection or a recent one.  If your health care provider suspects late-stage syphilis, you may have a spinal tap (lumbar puncture). This is a procedure in which a small amount of the fluid that surrounds the brain and spinal cord (cerebrospinal fluid or CSF) is removed and examined for syphilis. How do I prepare for this test? There is no preparation required for this test. What do the results mean? Your test results will be reported as either negative or positive. If the result of your syphilis test is negative, no antibodies were present at the time of the test. This could mean:  You do not have syphilis.  The antibodies have not formed yet. This can take several weeks. If it is possible that you were recently exposed to the bacteria, you may need to have the test again at a later time.  If you test positive for syphilis on the first nontreponemal test, you will most likely have the second treponemal test to confirm the diagnosis. If the result of the second test is also positive, it is likely that you have  syphilis. If the result of the second test is negative, further testing may be needed to make sure you do not have syphilis. Talk to your health care provider to discuss your results, treatment options, and if necessary, the need for more tests. It is your responsibility to obtain your test results. Ask the lab or department performing the test when and how you will get your results. Talk with your health care provider if you have any questions about your results. Talk with your health care  provider to discuss your results, treatment options, and if necessary, the need for more tests. Talk with your health care provider if you have any questions about your results. This information is not intended to replace advice given to you by your health care provider. Make sure you discuss any questions you have with your health care provider. Document Released: 04/22/2004 Document Revised: 11/24/2015 Document Reviewed: 07/14/2013 Elsevier Interactive Patient Education  [employer]2018 [employer] Inc[employer]

## 2016-09-05 LAB — CBC WITH DIFFERENTIAL/PLATELET
BASOS ABS: 0 10*3/uL (ref 0.0–0.1)
BASOS PCT: 0.3 % (ref 0.0–3.0)
EOS ABS: 0.2 10*3/uL (ref 0.0–0.7)
Eosinophils Relative: 7.2 % — ABNORMAL HIGH (ref 0.0–5.0)
HEMATOCRIT: 33.8 % — AB (ref 36.0–46.0)
HEMOGLOBIN: 11.6 g/dL — AB (ref 12.0–15.0)
LYMPHS PCT: 20.4 % (ref 12.0–46.0)
Lymphs Abs: 0.7 10*3/uL (ref 0.7–4.0)
MCHC: 34.2 g/dL (ref 30.0–36.0)
MCV: 91.1 fl (ref 78.0–100.0)
MONO ABS: 1 10*3/uL (ref 0.1–1.0)
Monocytes Relative: 30.9 % — ABNORMAL HIGH (ref 3.0–12.0)
Neutro Abs: 1.4 10*3/uL (ref 1.4–7.7)
Neutrophils Relative %: 41.2 % — ABNORMAL LOW (ref 43.0–77.0)
Platelets: 181 10*3/uL (ref 150.0–400.0)
RBC: 3.71 Mil/uL — AB (ref 3.87–5.11)
RDW: 12.8 % (ref 11.5–15.5)
WBC: 3.3 10*3/uL — AB (ref 4.0–10.5)

## 2016-09-05 LAB — HCG, QUANTITATIVE, PREGNANCY: QUANTITATIVE HCG: 0.18 m[IU]/mL

## 2016-09-05 LAB — HIV ANTIBODY (ROUTINE TESTING W REFLEX): HIV: NONREACTIVE

## 2016-09-05 LAB — T4, FREE: FREE T4: 0.7 ng/dL (ref 0.60–1.60)

## 2016-09-05 LAB — TSH: TSH: 1.65 u[IU]/mL (ref 0.35–4.50)

## 2016-09-05 LAB — VDRL: VDRL, Serum: NONREACTIVE

## 2016-09-05 LAB — FLUORESCENT TREPONEMAL AB(FTA)-IGG-BLD: FLUORESCENT TREPONEMAL ABS: NONREACTIVE

## 2016-09-05 LAB — RPR

## 2016-09-11 ENCOUNTER — Telehealth: Payer: Self-pay | Admitting: *Deleted

## 2016-09-11 NOTE — Telephone Encounter (Signed)
[facility] called stating the pt is on the line and questioned how much iron she should be taking.  I spoke with the pt and she stated she lost the copy of her labs and I advised her per Dr Rosezella FloridaPanosh's note to take an over the counter iron supplement daily and the 55mg  [facility] and she agreed.

## 2016-09-12 DIAGNOSIS — N898 Other specified noninflammatory disorders of vagina: Secondary | ICD-10-CM | POA: Diagnosis not present

## 2016-09-12 DIAGNOSIS — N925 Other specified irregular menstruation: Secondary | ICD-10-CM | POA: Diagnosis not present

## 2016-09-12 DIAGNOSIS — Z3202 Encounter for pregnancy test, result negative: Secondary | ICD-10-CM | POA: Diagnosis not present

## 2016-09-14 DIAGNOSIS — F909 Attention-deficit hyperactivity disorder, unspecified type: Secondary | ICD-10-CM | POA: Diagnosis not present

## 2016-09-14 DIAGNOSIS — F319 Bipolar disorder, unspecified: Secondary | ICD-10-CM | POA: Diagnosis not present

## 2016-09-25 DIAGNOSIS — F319 Bipolar disorder, unspecified: Secondary | ICD-10-CM | POA: Diagnosis not present

## 2016-09-25 DIAGNOSIS — F909 Attention-deficit hyperactivity disorder, unspecified type: Secondary | ICD-10-CM | POA: Diagnosis not present

## 2016-10-02 ENCOUNTER — Encounter (HOSPITAL_COMMUNITY): Payer: Self-pay | Admitting: Nurse Practitioner

## 2016-10-02 ENCOUNTER — Emergency Department (HOSPITAL_COMMUNITY)
Admission: EM | Admit: 2016-10-02 | Discharge: 2016-10-02 | Disposition: A | Discharge status: Home / Self Care | Payer: BLUE CROSS/BLUE SHIELD | Attending: Emergency Medicine | Admitting: Emergency Medicine

## 2016-10-02 DIAGNOSIS — F22 Delusional disorders: Secondary | ICD-10-CM | POA: Insufficient documentation

## 2016-10-02 DIAGNOSIS — Z87891 Personal history of nicotine dependence: Secondary | ICD-10-CM | POA: Insufficient documentation

## 2016-10-02 DIAGNOSIS — Z79899 Other long term (current) drug therapy: Secondary | ICD-10-CM | POA: Diagnosis not present

## 2016-10-02 DIAGNOSIS — F3161 Bipolar disorder, current episode mixed, mild: Secondary | ICD-10-CM | POA: Insufficient documentation

## 2016-10-02 DIAGNOSIS — F319 Bipolar disorder, unspecified: Secondary | ICD-10-CM | POA: Diagnosis not present

## 2016-10-02 DIAGNOSIS — R451 Restlessness and agitation: Secondary | ICD-10-CM | POA: Diagnosis not present

## 2016-10-02 LAB — COMPREHENSIVE METABOLIC PANEL
ALBUMIN: 4.1 g/dL (ref 3.5–5.0)
ALT: 17 U/L (ref 14–54)
ANION GAP: 8 (ref 5–15)
AST: 25 U/L (ref 15–41)
Alkaline Phosphatase: 67 U/L (ref 38–126)
BUN: 15 mg/dL (ref 6–20)
CO2: 25 mmol/L (ref 22–32)
Calcium: 9.5 mg/dL (ref 8.9–10.3)
Chloride: 105 mmol/L (ref 101–111)
Creatinine, Ser: 0.79 mg/dL (ref 0.44–1.00)
GFR calc Af Amer: >60 mL/min (ref >60)
GFR calc non Af Amer: >60 mL/min (ref >60)
GLUCOSE: 105 mg/dL — AB (ref 65–99)
POTASSIUM: 3.9 mmol/L (ref 3.5–5.1)
SODIUM: 138 mmol/L (ref 135–145)
Total Bilirubin: 0.4 mg/dL (ref 0.3–1.2)
Total Protein: 7.3 g/dL (ref 6.5–8.1)

## 2016-10-02 LAB — URINALYSIS, ROUTINE W REFLEX MICROSCOPIC
BILIRUBIN URINE: NEGATIVE
Glucose, UA: NEGATIVE mg/dL
Hgb urine dipstick: NEGATIVE
Ketones, ur: NEGATIVE mg/dL
Leukocytes, UA: NEGATIVE
NITRITE: NEGATIVE
PH: 6 (ref 5.0–8.0)
Protein, ur: NEGATIVE mg/dL
SPECIFIC GRAVITY, URINE: 1.019 (ref 1.005–1.030)

## 2016-10-02 LAB — CBC
HEMATOCRIT: 35.9 % — AB (ref 36.0–46.0)
Hemoglobin: 12.2 g/dL (ref 12.0–15.0)
MCH: 30.3 pg (ref 26.0–34.0)
MCHC: 34 g/dL (ref 30.0–36.0)
MCV: 89.1 fL (ref 78.0–100.0)
PLATELETS: 239 10*3/uL (ref 150–400)
RBC: 4.03 MIL/uL (ref 3.87–5.11)
RDW: 13.1 % (ref 11.5–15.5)
WBC: 8.4 10*3/uL (ref 4.0–10.5)

## 2016-10-02 LAB — I-STAT BETA HCG BLOOD, ED (MC, WL, AP ONLY)

## 2016-10-02 LAB — RAPID URINE DRUG SCREEN, HOSP PERFORMED
AMPHETAMINES: NOT DETECTED
BARBITURATES: NOT DETECTED
BENZODIAZEPINES: NOT DETECTED
COCAINE: NOT DETECTED
Opiates: NOT DETECTED
Tetrahydrocannabinol: NOT DETECTED

## 2016-10-02 LAB — ETHANOL: Alcohol, Ethyl (B): <5 mg/dL (ref <5)

## 2016-10-02 LAB — SALICYLATE LEVEL: Salicylate Lvl: <7 mg/dL (ref 2.8–30.0)

## 2016-10-02 LAB — ACETAMINOPHEN LEVEL: Acetaminophen (Tylenol), Serum: <10 ug/mL — ABNORMAL LOW (ref 10–30)

## 2016-10-02 LAB — LITHIUM LEVEL: Lithium Lvl: <0.06 mmol/L — ABNORMAL LOW (ref 0.60–1.20)

## 2016-10-02 MED ORDER — BIOTIN 1000 MCG PO TABS: 1000.0000 ug | ORAL_TABLET | Freq: Every day | ORAL | Status: DC

## 2016-10-02 MED ORDER — LITHIUM CARBONATE ER 450 MG PO TBCR: 225.0000 mg | EXTENDED_RELEASE_TABLET | Freq: Every day | ORAL | Status: DC

## 2016-10-02 MED ORDER — SERTRALINE HCL 50 MG PO TABS
50.0000 mg | ORAL_TABLET | Freq: Every day | ORAL | Status: DC
  Filled 2016-10-02: qty 1

## 2016-10-02 MED ORDER — RISPERIDONE 1 MG PO TABS
1.5000 mg | ORAL_TABLET | Freq: Every day | ORAL | Status: DC
  Administered 2016-10-02: 1.5 mg via ORAL
  Filled 2016-10-02: qty 1

## 2016-10-02 MED ORDER — FERROUS SULFATE 325 (65 FE) MG PO TABS
325.0000 mg | ORAL_TABLET | Freq: Every day | ORAL | Status: DC
  Administered 2016-10-02: 325 mg via ORAL
  Filled 2016-10-02 (×2): qty 1

## 2016-10-02 NOTE — ED Notes (Signed)
Patient given warm blanket and milk
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
## 2016-10-02 NOTE — ED Triage Notes (Signed)
Pt is presented by her mother who collaborates hx stating pt has a psychiatric problems and has recently been delusional. Pt states she is pregnant and noone believes her. She was worked up for similar complaint by her OBGYN as chart review shows with results of negative pregnancy. She reports recent unprotected sex and continues to say that he symptoms of feeling tired, sick, nauseated and noticing different odors can be attributed to her pregnancy. Pt denies suicidal or homicidal ideation.

## 2016-10-02 NOTE — Discharge Instructions (Signed)
For your ongoing behavioral health needs, you are advised to continue treatment with the [facility].  Your next appointment with Leone Payorrystal Montague, NP is scheduled for Thursday, October 12, 2016 at 1:45 pm:       [facility]      [address]      [place], [place] [address]      [phone]

## 2016-10-02 NOTE — ED Notes (Signed)
Patient called out stating that she wanted to know the results of her pregnancy test that she did earlier when she got her.  Informed patient that MD would be the one to discuss her test results with her.

## 2016-10-02 NOTE — Progress Notes (Signed)
PHARMACIST - PHYSICIAN ORDER COMMUNICATION  CONCERNING: P&T Medication Policy on Herbal Medications  DESCRIPTION:  This patient's order for:  Biotin  has been noted.  This product(s) is classified as an "herbal" or natural product. Due to a lack of definitive safety studies or FDA approval, nonstandard manufacturing practices, plus the potential risk of unknown drug-drug interactions while on inpatient medications, the Pharmacy and Therapeutics Committee does not permit the use of "herbal" or natural products of this type within [facility].   ACTION TAKEN: The pharmacy department is unable to verify this order at this time and your patient has been informed of this safety policy. Please reevaluate patient's clinical condition at discharge and address if the herbal or natural product(s) should be resumed at that time.   Thanks Lorenza EvangelistGreen, Promiss Labarbera R 10/02/2016 3:49 AM

## 2016-10-02 NOTE — Progress Notes (Signed)
Pt reports she does not want her mother Cheryl Cohen([name] Crady) to have consent for her records. Pt was informed, only the individuals she signs consent to release information to will be granted information. Pt verbalized understanding and expressed she does not want her mother to have access to her information or plan of care.  Cheryl Cohen, MSW, LCSWA TTS Specialist [phone]
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
## 2016-10-02 NOTE — ED Notes (Signed)
TTS screening being done at this time.  

## 2016-10-02 NOTE — BH Assessment (Signed)
[facility] Assessment Progress Note  Per Thedore MinsMojeed Akintayo, MD, this pt does not require psychiatric hospitalization at this time.  [facility] to continue treatment with Leone Payorrystal Montague, NP at the [facility].  Her next appointment is scheduled for Thursday, 10/12/2016 at 13:45.  This has been included in pt's discharge instructions.  Pt's nurse has been notified.  Doylene Canninghomas Tonatiuh Mallon, MA Triage Specialist [phone]

## 2016-10-02 NOTE — BHH Suicide Risk Assessment (Signed)
Suicide Risk Assessment  Discharge Assessment   [facility]   Principal Problem: Bipolar affective disorder, mixed, mild (HCC) Discharge Diagnoses:  Patient Active Problem List   Diagnosis Date Noted  . Bipolar affective disorder, mixed, mild (HCC) [F31.61] 02/21/2016    Priority: High  . Fetal demise, greater than 22 weeks, antepartum [O36.4XX0] 09/08/2015  . Suicidal ideation [R45.851] 10/06/2013  . Family history of diabetes mellitus type II [Z83.3] 01/11/2012    Total Time spent with patient: 45 minutes  Musculoskeletal: Strength & Muscle Tone: within normal limits Gait & Station: normal Patient leans: N/A  Psychiatric Specialty Exam:   Blood pressure 120/81, pulse 77, temperature 98.3 F (36.8 C), temperature source Oral, resp. rate 16, SpO2 100 %.There is no height or weight on file to calculate BMI.  General Appearance: Casual  Eye Contact::  Good  Speech:  Normal Rate409  Volume:  Normal  Mood:  Euthymic  Affect:  Congruent  Thought Process:  Coherent and Descriptions of Associations: Intact  Orientation:  Full (Time, Place, and Person)  Thought Content:  WDL and Logical  Suicidal Thoughts:  No  Homicidal Thoughts:  No  Memory:  Immediate;   Good Recent;   Good Remote;   Good  Judgement:  Fair  Insight:  Fair  Psychomotor Activity:  Normal  Concentration:  Good  Recall:  Good  Fund of Knowledge:Fair  Language: Good  Akathisia:  No  Handed:  Right  AIMS (if indicated):     Assets:  Housing Intimacy Leisure Time Physical Health Resilience Social Support  Sleep:     Cognition: WNL  ADL's:  Intact   Mental Status Per Nursing Assessment::   On Admission:   Patient reports she got upset because she thought her boyfriend was going to break up with her and she ran down the street.  This was not the case and today she wants to return home.  No suicidal/homicidal ideations, hallucinations, and alcohol/drug abuse.  Patient at  neuropsychiatry and already has an appointment in place.  Demographic Factors:  Adolescent or young adult  Loss Factors: NA  Historical Factors: NA  Risk Reduction Factors:   Sense of responsibility to family, Living with another person, especially a relative, Positive social support and Positive therapeutic relationship  Continued Clinical Symptoms:  None  Cognitive Features That Contribute To Risk:  None    Suicide Risk:  Minimal: No identifiable suicidal ideation.  Patients presenting with no risk factors but with morbid ruminations; may be classified as minimal risk based on the severity of the depressive symptoms    Plan Of Care/Follow-up recommendations:  Activity:  as tolerated Diet:  heart healhty diet  Cheryl Vilar, NP 10/02/2016, 10:43 AM

## 2016-10-03 ENCOUNTER — Emergency Department (HOSPITAL_COMMUNITY)
Admission: EM | Admit: 2016-10-03 | Discharge: 2016-10-05 | Disposition: A | Discharge status: Other Acute Inpatient Hospital | Payer: BLUE CROSS/BLUE SHIELD | Attending: Emergency Medicine | Admitting: Emergency Medicine

## 2016-10-03 ENCOUNTER — Encounter (HOSPITAL_COMMUNITY): Payer: Self-pay | Admitting: Emergency Medicine

## 2016-10-03 DIAGNOSIS — Z046 Encounter for general psychiatric examination, requested by authority: Secondary | ICD-10-CM | POA: Insufficient documentation

## 2016-10-03 DIAGNOSIS — F3163 Bipolar disorder, current episode mixed, severe, without psychotic features: Secondary | ICD-10-CM | POA: Insufficient documentation

## 2016-10-03 DIAGNOSIS — F22 Delusional disorders: Secondary | ICD-10-CM | POA: Diagnosis not present

## 2016-10-03 DIAGNOSIS — Z87891 Personal history of nicotine dependence: Secondary | ICD-10-CM | POA: Insufficient documentation

## 2016-10-03 DIAGNOSIS — R451 Restlessness and agitation: Secondary | ICD-10-CM | POA: Diagnosis not present

## 2016-10-03 DIAGNOSIS — F4010 Social phobia, unspecified: Secondary | ICD-10-CM | POA: Diagnosis not present

## 2016-10-03 DIAGNOSIS — Z79899 Other long term (current) drug therapy: Secondary | ICD-10-CM | POA: Insufficient documentation

## 2016-10-03 DIAGNOSIS — Z9119 Patient's noncompliance with other medical treatment and regimen: Secondary | ICD-10-CM | POA: Diagnosis not present

## 2016-10-03 DIAGNOSIS — F319 Bipolar disorder, unspecified: Secondary | ICD-10-CM | POA: Diagnosis not present

## 2016-10-03 DIAGNOSIS — Z811 Family history of alcohol abuse and dependence: Secondary | ICD-10-CM | POA: Diagnosis not present

## 2016-10-03 MED ORDER — DIPHENHYDRAMINE HCL 50 MG/ML IJ SOLN
50.0000 mg | Freq: Once | INTRAMUSCULAR | Status: AC
  Administered 2016-10-04: 50 mg via INTRAMUSCULAR
  Filled 2016-10-03: qty 1

## 2016-10-03 MED ORDER — ACETAMINOPHEN 325 MG PO TABS
650.0000 mg | ORAL_TABLET | ORAL | Status: DC | PRN
  Administered 2016-10-05: 650 mg via ORAL
  Filled 2016-10-03: qty 2

## 2016-10-03 MED ORDER — LORAZEPAM 2 MG/ML IJ SOLN
2.0000 mg | Freq: Once | INTRAMUSCULAR | Status: AC
  Administered 2016-10-04: 2 mg via INTRAMUSCULAR
  Filled 2016-10-03: qty 1

## 2016-10-03 MED ORDER — IBUPROFEN 200 MG PO TABS: 600.0000 mg | ORAL_TABLET | Freq: Three times a day (TID) | ORAL | Status: DC | PRN

## 2016-10-03 MED ORDER — ALUM & MAG HYDROXIDE-SIMETH 200-200-20 MG/5ML PO SUSP: 30.0000 mL | Freq: Four times a day (QID) | ORAL | Status: DC | PRN

## 2016-10-03 MED ORDER — ONDANSETRON HCL 4 MG PO TABS: 4.0000 mg | ORAL_TABLET | Freq: Three times a day (TID) | ORAL | Status: DC | PRN

## 2016-10-03 MED ORDER — ZIPRASIDONE MESYLATE 20 MG IM SOLR
20.0000 mg | Freq: Once | INTRAMUSCULAR | Status: AC
  Administered 2016-10-04: 20 mg via INTRAMUSCULAR
  Filled 2016-10-03: qty 20

## 2016-10-03 NOTE — ED Provider Notes (Signed)
[facility] Provider Note: Lowella Dell, MD, FACEP  CSN: [id number] MRN: [id number] ARRIVAL: 10/03/16 at 2243 ROOM: WA17/WA17   CHIEF COMPLAINT  No chief complaint on file.  Level 5 Caveat: psychosis HISTORY OF PRESENT ILLNESS  Shree Taysha Majewski is a 26 y.o. female who was seen in the ED yesterday for delusions and agitation and was sent to [facility]. She was sent back this evening due to noncompliance with treatment, reportedly throwing a glass of water. She is under involuntary commitment and reportedly is cooperative at this time. She states she is pregnant and believes she is having twins although her pregnancy test was negative yesterday. She states she has been eating "a lot" and has been having cramps in her lower back and lower abdomen but none presently.   Past Medical History:  Diagnosis Date  . Anxiety   . Bipolar 1 disorder (HCC)    hospitalized  3 x onset rx in 9th grade   . Depression   . Gonorrhea   . Hx of varicella   . Syphilis   . Twin pregnancy, delivered vaginally, IUFD stillborns 09/09/2015   IUFD  22 + weeks  still borns     Past Surgical History:  Procedure Laterality Date  . DILATION AND EVACUATION N/A 09/09/2015   Procedure: DILATATION AND EVACUATION;  Surgeon: [name], MD;  Location: [facility];  Service: Gynecology;  Laterality: N/A;  . TYMPANOSTOMY TUBE PLACEMENT     asage 2-3 yrs    Family History  Problem Relation Age of Onset  . Diabetes Mother   . Arthritis Mother   . Hypertension Father   . Alcohol abuse Father   . Alcoholism Father     Social History  Substance Use Topics  . Smoking status: Former Smoker    Packs/day: 0.20    Types: Cigarettes  . Smokeless tobacco: Never Used     Comment: 1 cigarette a day  . Alcohol use No    Prior to Admission medications   Medication Sig Start Date End Date Taking? Authorizing Provider  Biotin 1000 MCG tablet Take 1,000 mcg by mouth daily.    [provider]  ferrous  sulfate 325 (65 FE) MG EC tablet Take 325 mg by mouth daily with breakfast.    [provider]  lithium carbonate (ESKALITH) 450 MG CR tablet Take 450 mg by mouth at bedtime 08/18/16   [provider]  Omega-3 1000 MG CAPS Take 1,000 mg by mouth daily.    [provider]  risperiDONE (RISPERDAL) 3 MG tablet Take 3 mg by mouth daily 08/18/16   [provider]  sertraline (ZOLOFT) 50 MG tablet Take 50 mg by mouth at bedtime. 06/18/16   [provider]    Allergies Amoxicillin   REVIEW OF SYSTEMS     PHYSICAL EXAMINATION  Initial Vital Signs Blood pressure 126/73, pulse 81, temperature 98.4 F (36.9 C), temperature source Oral, resp. rate 16, height 5\' 7"  (1.702 m), weight 72.6 kg (160 lb), last menstrual period 10/01/2016, SpO2 100 %.  Examination General: Well-developed, well-nourished female in no acute distress; appearance consistent with age of record HENT: normocephalic; atraumatic Eyes: pupils equal, round and reactive to light; extraocular muscles intact Neck: supple Heart: regular rate and rhythm Lungs: clear to auscultation bilaterally Abdomen: soft; nondistended; nontender; no masses or hepatosplenomegaly; bowel sounds present Extremities: No deformity; full range of motion; pulses normal Neurologic: Awake, alert; motor function intact in all extremities and symmetric; no facial droop Skin:  Warm and dry Psychiatric: Delusions; friendly and cooperative   RESULTS  Summary of this visit's results, reviewed by myself:   EKG Interpretation  Date/Time:    Ventricular Rate:    PR Interval:    QRS Duration:   QT Interval:    QTC Calculation:   R Axis:     Text Interpretation:        Laboratory Studies: No results found for this or any previous visit (from the past 24 hour(s)). Imaging Studies: No results found.  ED COURSE  Nursing notes and initial vitals signs, including pulse oximetry, reviewed.  Vitals:   10/03/16  2248  BP: 126/73  Pulse: 81  Resp: 16  Temp: 98.4 F (36.9 C)  TempSrc: Oral  SpO2: 100%  Weight: 72.6 kg (160 lb)  Height: 5\' 7"  (1.702 m)    PROCEDURES    ED DIAGNOSES     ICD-10-CM   1. Delusions (HCC) F22        Jaydee Conran, MD 10/03/16 2302

## 2016-10-03 NOTE — ED Triage Notes (Signed)
[facility] states that patient is not compliant with treatment and psychiatrist stated to transfer her to [facility] for further treatment. Patient is under IVC and is cooperative at present time. [facility].

## 2016-10-03 NOTE — ED Notes (Signed)
Bed: WA17 Expected date:  Expected time:  Means of arrival:  Comments: From [facility]-combative/IVC

## 2016-10-04 DIAGNOSIS — F3163 Bipolar disorder, current episode mixed, severe, without psychotic features: Secondary | ICD-10-CM | POA: Diagnosis present

## 2016-10-04 MED ORDER — HALOPERIDOL 2 MG PO TABS
2.0000 mg | ORAL_TABLET | Freq: Two times a day (BID) | ORAL | Status: DC
  Administered 2016-10-04 – 2016-10-05 (×3): 2 mg via ORAL
  Filled 2016-10-04 (×3): qty 1

## 2016-10-04 MED ORDER — TRIHEXYPHENIDYL HCL 2 MG PO TABS
2.0000 mg | ORAL_TABLET | Freq: Two times a day (BID) | ORAL | Status: DC
  Administered 2016-10-04 – 2016-10-05 (×3): 2 mg via ORAL
  Filled 2016-10-04 (×2): qty 1

## 2016-10-04 MED ORDER — TRAZODONE HCL 100 MG PO TABS
100.0000 mg | ORAL_TABLET | Freq: Every day | ORAL | Status: DC
  Administered 2016-10-04: 100 mg via ORAL
  Filled 2016-10-04: qty 1

## 2016-10-04 NOTE — ED Notes (Signed)
TTS into see 

## 2016-10-04 NOTE — Progress Notes (Signed)
10/04/16 1335:  Pt was sitting in dayroom watching television.  LRT asked pt if she would be interested in doing activities.  Pt stated she was interested.  LRT, intern and pt played [place]O and Jenga.  Pt was bright and talking about her pregnancy, baby's father and her mother.  Pt then played checkers with intern.  Pt was appropriate and social throughout activity.  Pt requested coloring sheets and blank paper to draw on for later.  LRT copied the coloring sheets and left blank paper with pt.   Caroll RancherMarjette Eaven Schwager, LRT/CTRS
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
## 2016-10-04 NOTE — ED Notes (Signed)
Pt resting at present, no distress noted, calm at present.  Will continue to monitor.

## 2016-10-04 NOTE — ED Notes (Signed)
On the phone 

## 2016-10-04 NOTE — ED Notes (Addendum)
In the dayroom drawing, singing loudly at times, redirectable

## 2016-10-04 NOTE — ED Notes (Signed)
Up tot he bathroom to shower and change scrubs 

## 2016-10-04 NOTE — ED Notes (Signed)
Dr Akintyo and Jamison DNP into see 

## 2016-10-04 NOTE — ED Notes (Signed)
Pt A&O x 3, no distress noted, calm & cooperative at present.  Periodically in dayroom, conversing with others.  Monitoring for safety, Q 15 min checks in effect.

## 2016-10-04 NOTE — ED Notes (Signed)
Up to the bathroom 

## 2016-10-04 NOTE — ED Notes (Addendum)
Pt medicated w/o difficulty. Snack given.  Pt asked about her admission.  Admission discussed w/ pt who responded that she was "going to kill herself."  When asked to contract for safety, pt layed on the bed and would not answer or open her eyes. Will continue to monitor closely.

## 2016-10-04 NOTE — ED Notes (Signed)
In dayroom w/ rec therapist 

## 2016-10-04 NOTE — ED Notes (Signed)
In day room talking w/ other pt 

## 2016-10-04 NOTE — ED Notes (Signed)
[facility], delusional thinking she is pregnant, demanding an Ultrasound be done on her immediately.  Pt angry and cursing at staff, stating she will f__k this department up and Dr Jannifer FranklinAkintayo.  Awake, alert & responsive.  Monitoring for safety, Q 15 min checks in effect, no distress noted.  Safety check for contraband completed, no items found.

## 2016-10-04 NOTE — ED Notes (Signed)
Pt cursing at staff, demanding a Ultrasound because she states she is pregnant.  Pt states, "I will turn this department out."  PA Spencer notifed.  Med orders given.  [employer] and Security at bedside for assistance.

## 2016-10-04 NOTE — ED Notes (Signed)
In day room drawing

## 2016-10-04 NOTE — ED Notes (Signed)
Sitting in room talking about her pregnancy and who the father is.  Smiling, pleasant, attempted to redirect to other topics.

## 2016-10-04 NOTE — ED Notes (Addendum)
In room singing, talking, pleasant. Pt states the she was "not going to kill myself."  Pt continues to think she is pregnant.

## 2016-10-05 DIAGNOSIS — F22 Delusional disorders: Secondary | ICD-10-CM | POA: Diagnosis not present

## 2016-10-05 DIAGNOSIS — N281 Cyst of kidney, acquired: Secondary | ICD-10-CM | POA: Diagnosis not present

## 2016-10-05 DIAGNOSIS — Z046 Encounter for general psychiatric examination, requested by authority: Secondary | ICD-10-CM | POA: Diagnosis not present

## 2016-10-05 DIAGNOSIS — R443 Hallucinations, unspecified: Secondary | ICD-10-CM

## 2016-10-05 DIAGNOSIS — F312 Bipolar disorder, current episode manic severe with psychotic features: Secondary | ICD-10-CM | POA: Diagnosis not present

## 2016-10-05 DIAGNOSIS — Z811 Family history of alcohol abuse and dependence: Secondary | ICD-10-CM

## 2016-10-05 DIAGNOSIS — F4010 Social phobia, unspecified: Secondary | ICD-10-CM

## 2016-10-05 DIAGNOSIS — F3163 Bipolar disorder, current episode mixed, severe, without psychotic features: Secondary | ICD-10-CM

## 2016-10-05 DIAGNOSIS — R140 Abdominal distension (gaseous): Secondary | ICD-10-CM | POA: Diagnosis not present

## 2016-10-05 DIAGNOSIS — Z9119 Patient's noncompliance with other medical treatment and regimen: Secondary | ICD-10-CM | POA: Diagnosis not present

## 2016-10-05 DIAGNOSIS — R45850 Homicidal ideations: Secondary | ICD-10-CM

## 2016-10-05 DIAGNOSIS — R454 Irritability and anger: Secondary | ICD-10-CM | POA: Diagnosis not present

## 2016-10-05 DIAGNOSIS — Z87891 Personal history of nicotine dependence: Secondary | ICD-10-CM

## 2016-10-05 DIAGNOSIS — R451 Restlessness and agitation: Secondary | ICD-10-CM | POA: Diagnosis not present

## 2016-10-05 DIAGNOSIS — F1290 Cannabis use, unspecified, uncomplicated: Secondary | ICD-10-CM | POA: Diagnosis not present

## 2016-10-05 DIAGNOSIS — Z9114 Patient's other noncompliance with medication regimen: Secondary | ICD-10-CM | POA: Diagnosis not present

## 2016-10-05 DIAGNOSIS — Z79899 Other long term (current) drug therapy: Secondary | ICD-10-CM | POA: Diagnosis not present

## 2016-10-05 MED ORDER — ZIPRASIDONE MESYLATE 20 MG IM SOLR: 20.0000 mg | Freq: Once | INTRAMUSCULAR | Status: DC

## 2016-10-05 MED ORDER — IBUPROFEN 200 MG PO TABS: 600.0000 mg | ORAL_TABLET | Freq: Three times a day (TID) | ORAL | Status: DC | PRN

## 2016-10-05 MED ORDER — DIPHENHYDRAMINE HCL 50 MG/ML IJ SOLN
25.0000 mg | Freq: Once | INTRAMUSCULAR | Status: AC
Start: 2016-10-05 — End: 2016-10-05
  Administered 2016-10-05: 25 mg via INTRAMUSCULAR
  Filled 2016-10-05: qty 1

## 2016-10-05 MED ORDER — HALOPERIDOL DECANOATE 100 MG/ML IM SOLN
50.0000 mg | INTRAMUSCULAR | Status: DC
  Administered 2016-10-05: 50 mg via INTRAMUSCULAR
  Filled 2016-10-05: qty 0.5

## 2016-10-05 MED ORDER — LORAZEPAM 2 MG/ML IJ SOLN
2.0000 mg | Freq: Once | INTRAMUSCULAR | Status: AC
  Administered 2016-10-05: 2 mg via INTRAMUSCULAR
  Filled 2016-10-05: qty 1

## 2016-10-05 MED ORDER — ZIPRASIDONE MESYLATE 20 MG IM SOLR
INTRAMUSCULAR | Status: AC
  Administered 2016-10-05: 10 mg
  Filled 2016-10-05: qty 20

## 2016-10-05 MED ORDER — HALOPERIDOL 2 MG PO TABS
2.0000 mg | ORAL_TABLET | Freq: Four times a day (QID) | ORAL | Status: DC | PRN
  Administered 2016-10-05: 2 mg via ORAL
  Filled 2016-10-05: qty 1

## 2016-10-05 NOTE — ED Provider Notes (Signed)
Pt accepted for inpatient [facility] at [facility], Dr. Carolyn StareBasnet Psychiatrist, attending. Pt medically clear for transfer and inpatient psychiatric care. No t pregnant. Reassuring labs 7/3   Cheryl PorterJames, Cheryl Hemric, MD 10/05/16 1343

## 2016-10-05 NOTE — ED Notes (Signed)
Pt continues to focus on "pregnancy".  Gently reoriented to reality.  Compliant with scheduled medications.  Support and redirection given.

## 2016-10-05 NOTE — ED Notes (Signed)
[facility].  Pt was calm and cooperative.  Report called to [facility].  All belongings were sent with patient.

## 2016-10-05 NOTE — ED Notes (Signed)
Pt more anxious, agitated, cursing at staff members, slamming the phone down on receiver after pt informed phone hours were over at 9pm.  PA GillisSpencer notified of behaviors.  PRN med order given.

## 2016-10-05 NOTE — BH Assessment (Signed)
[facility] Assessment Progress Note  Per Thedore MinsMojeed Akintayo, MD, this pt requires psychiatric hospitalization at this time.  Pt presents under IVC initiated by her mother, which Dr Jannifer FranklinAkintayo has upheld.  At 13:30 Satira SarkShawna Allen calls from [facility] to report that pt has been accepted to their facility by Dr Carolyn StareBasnet.  EDP Rolland PorterMark James, MD concurs with this decision.  Pt's nurse, Kendal Hymendie, has been notified, and agrees to call report to [phone].  [facility]  Doylene Canninghomas Laurianne Floresca, MA Triage Specialist [phone]
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
## 2016-10-05 NOTE — ED Notes (Signed)
Pt was upset that she will not be discharged home.  She became agitated and started cursing me and slamming her door shut.  She did not become physical in any way.  Prn order obtained from NP

## 2016-10-05 NOTE — Progress Notes (Signed)
10/05/16 1345:  Pt was sleep in her room.  LRT woke up pt at the request of nurse.  Pt was pleasant and stated that she wanted to do some activities.  LRT, pt and intern played hot potato with the beach ball in the dayroom.  During the activity, pt was laughing and appropriate.  Pt mentioned several times that she was ready to leave here.  Pt went to talk to nurse about when she would be leaving.  Pt came back and was upset and became more agitated because she wasn't happy with the answer she got.  Pt left the dayroom and did not return.  Caroll RancherMarjette Caydon Feasel, LRT/CTRS

## 2016-10-06 DIAGNOSIS — F312 Bipolar disorder, current episode manic severe with psychotic features: Secondary | ICD-10-CM | POA: Diagnosis not present

## 2016-10-06 DIAGNOSIS — F1290 Cannabis use, unspecified, uncomplicated: Secondary | ICD-10-CM | POA: Diagnosis not present

## 2016-10-06 DIAGNOSIS — Z9114 Patient's other noncompliance with medication regimen: Secondary | ICD-10-CM | POA: Diagnosis not present

## 2016-10-07 DIAGNOSIS — F312 Bipolar disorder, current episode manic severe with psychotic features: Secondary | ICD-10-CM | POA: Diagnosis not present

## 2016-10-08 DIAGNOSIS — F312 Bipolar disorder, current episode manic severe with psychotic features: Secondary | ICD-10-CM | POA: Diagnosis not present

## 2016-10-09 DIAGNOSIS — F312 Bipolar disorder, current episode manic severe with psychotic features: Secondary | ICD-10-CM | POA: Diagnosis not present

## 2016-10-09 DIAGNOSIS — R454 Irritability and anger: Secondary | ICD-10-CM | POA: Diagnosis not present

## 2016-10-10 DIAGNOSIS — F312 Bipolar disorder, current episode manic severe with psychotic features: Secondary | ICD-10-CM | POA: Diagnosis not present

## 2016-10-11 DIAGNOSIS — N281 Cyst of kidney, acquired: Secondary | ICD-10-CM | POA: Diagnosis not present

## 2016-10-11 DIAGNOSIS — F312 Bipolar disorder, current episode manic severe with psychotic features: Secondary | ICD-10-CM | POA: Diagnosis not present

## 2016-10-11 DIAGNOSIS — R140 Abdominal distension (gaseous): Secondary | ICD-10-CM | POA: Diagnosis not present

## 2016-10-12 DIAGNOSIS — F312 Bipolar disorder, current episode manic severe with psychotic features: Secondary | ICD-10-CM | POA: Diagnosis not present

## 2016-10-13 DIAGNOSIS — F312 Bipolar disorder, current episode manic severe with psychotic features: Secondary | ICD-10-CM | POA: Diagnosis not present

## 2016-10-14 DIAGNOSIS — F312 Bipolar disorder, current episode manic severe with psychotic features: Secondary | ICD-10-CM | POA: Diagnosis not present

## 2016-10-15 DIAGNOSIS — F312 Bipolar disorder, current episode manic severe with psychotic features: Secondary | ICD-10-CM | POA: Diagnosis not present

## 2016-10-16 DIAGNOSIS — F312 Bipolar disorder, current episode manic severe with psychotic features: Secondary | ICD-10-CM | POA: Diagnosis not present

## 2016-10-17 DIAGNOSIS — F312 Bipolar disorder, current episode manic severe with psychotic features: Secondary | ICD-10-CM | POA: Diagnosis not present

## 2016-10-18 DIAGNOSIS — F312 Bipolar disorder, current episode manic severe with psychotic features: Secondary | ICD-10-CM | POA: Diagnosis not present

## 2016-10-20 ENCOUNTER — Emergency Department (HOSPITAL_COMMUNITY)
Admission: EM | Admit: 2016-10-20 | Discharge: 2016-10-23 | Disposition: A | Discharge status: Other Acute Inpatient Hospital | Payer: BLUE CROSS/BLUE SHIELD | Attending: Emergency Medicine | Admitting: Emergency Medicine

## 2016-10-20 ENCOUNTER — Encounter (HOSPITAL_COMMUNITY): Payer: Self-pay | Admitting: Emergency Medicine

## 2016-10-20 DIAGNOSIS — F909 Attention-deficit hyperactivity disorder, unspecified type: Secondary | ICD-10-CM | POA: Diagnosis not present

## 2016-10-20 DIAGNOSIS — Z811 Family history of alcohol abuse and dependence: Secondary | ICD-10-CM | POA: Diagnosis not present

## 2016-10-20 DIAGNOSIS — F29 Unspecified psychosis not due to a substance or known physiological condition: Secondary | ICD-10-CM | POA: Diagnosis not present

## 2016-10-20 DIAGNOSIS — Z87891 Personal history of nicotine dependence: Secondary | ICD-10-CM | POA: Diagnosis not present

## 2016-10-20 DIAGNOSIS — F3163 Bipolar disorder, current episode mixed, severe, without psychotic features: Secondary | ICD-10-CM | POA: Diagnosis present

## 2016-10-20 DIAGNOSIS — Z008 Encounter for other general examination: Secondary | ICD-10-CM | POA: Insufficient documentation

## 2016-10-20 DIAGNOSIS — F918 Other conduct disorders: Secondary | ICD-10-CM | POA: Diagnosis not present

## 2016-10-20 DIAGNOSIS — Z79899 Other long term (current) drug therapy: Secondary | ICD-10-CM | POA: Diagnosis not present

## 2016-10-20 DIAGNOSIS — F319 Bipolar disorder, unspecified: Secondary | ICD-10-CM | POA: Diagnosis not present

## 2016-10-20 LAB — ETHANOL: Alcohol, Ethyl (B): <5 mg/dL (ref <5)

## 2016-10-20 LAB — CBC WITH DIFFERENTIAL/PLATELET
BASOS PCT: 1 %
Basophils Absolute: 0 10*3/uL (ref 0.0–0.1)
EOS ABS: 0.5 10*3/uL (ref 0.0–0.7)
EOS PCT: 8 %
HCT: 35 % — ABNORMAL LOW (ref 36.0–46.0)
HEMOGLOBIN: 12.1 g/dL (ref 12.0–15.0)
Lymphocytes Relative: 35 %
Lymphs Abs: 2.4 10*3/uL (ref 0.7–4.0)
MCH: 30 pg (ref 26.0–34.0)
MCHC: 34.6 g/dL (ref 30.0–36.0)
MCV: 86.8 fL (ref 78.0–100.0)
Monocytes Absolute: 1.4 10*3/uL — ABNORMAL HIGH (ref 0.1–1.0)
Monocytes Relative: 20 %
NEUTROS PCT: 36 %
Neutro Abs: 2.5 10*3/uL (ref 1.7–7.7)
PLATELETS: 257 10*3/uL (ref 150–400)
RBC: 4.03 MIL/uL (ref 3.87–5.11)
RDW: 12.5 % (ref 11.5–15.5)
WBC: 6.8 10*3/uL (ref 4.0–10.5)

## 2016-10-20 LAB — URINALYSIS, ROUTINE W REFLEX MICROSCOPIC
BILIRUBIN URINE: NEGATIVE
Glucose, UA: NEGATIVE mg/dL
Hgb urine dipstick: NEGATIVE
Ketones, ur: NEGATIVE mg/dL
Leukocytes, UA: NEGATIVE
NITRITE: NEGATIVE
PROTEIN: NEGATIVE mg/dL
SPECIFIC GRAVITY, URINE: 1.005 (ref 1.005–1.030)
pH: 6 (ref 5.0–8.0)

## 2016-10-20 LAB — RAPID URINE DRUG SCREEN, HOSP PERFORMED
AMPHETAMINES: NOT DETECTED
BENZODIAZEPINES: NOT DETECTED
Barbiturates: NOT DETECTED
Cocaine: NOT DETECTED
Opiates: NOT DETECTED
TETRAHYDROCANNABINOL: NOT DETECTED

## 2016-10-20 LAB — COMPREHENSIVE METABOLIC PANEL
ALBUMIN: 4.1 g/dL (ref 3.5–5.0)
ALT: 18 U/L (ref 14–54)
AST: 22 U/L (ref 15–41)
Alkaline Phosphatase: 49 U/L (ref 38–126)
Anion gap: 8 (ref 5–15)
BUN: 12 mg/dL (ref 6–20)
CHLORIDE: 101 mmol/L (ref 101–111)
CO2: 29 mmol/L (ref 22–32)
CREATININE: 0.69 mg/dL (ref 0.44–1.00)
Calcium: 9.3 mg/dL (ref 8.9–10.3)
GFR calc Af Amer: >60 mL/min (ref >60)
GFR calc non Af Amer: >60 mL/min (ref >60)
GLUCOSE: 87 mg/dL (ref 65–99)
Potassium: 3.6 mmol/L (ref 3.5–5.1)
SODIUM: 138 mmol/L (ref 135–145)
Total Bilirubin: 0.5 mg/dL (ref 0.3–1.2)
Total Protein: 7 g/dL (ref 6.5–8.1)

## 2016-10-20 LAB — POC URINE PREG, ED: PREG TEST UR: NEGATIVE

## 2016-10-20 LAB — LITHIUM LEVEL

## 2016-10-20 MED ORDER — OLANZAPINE 5 MG PO TABS
5.0000 mg | ORAL_TABLET | ORAL | Status: DC
  Administered 2016-10-21 – 2016-10-23 (×3): 5 mg via ORAL
  Filled 2016-10-20 (×4): qty 1

## 2016-10-20 MED ORDER — OLANZAPINE 5 MG PO TABS
15.0000 mg | ORAL_TABLET | Freq: Every day | ORAL | Status: DC
  Administered 2016-10-20 – 2016-10-22 (×3): 15 mg via ORAL
  Filled 2016-10-20 (×2): qty 1

## 2016-10-20 NOTE — BH Assessment (Signed)
[facility] Assessment Progress Note   Case was staffed with lord DNP who recommended a inpatient admission as appropriate bed placement is investigated.
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
## 2016-10-20 NOTE — ED Notes (Signed)
Pt taking a shower 

## 2016-10-20 NOTE — ED Notes (Signed)
Patient sitting at her door talking and singing.
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
## 2016-10-20 NOTE — ED Notes (Signed)
SBAR Report received from previous nurse. Pt received calm and visible on unit. Pt denies current SI/ HI, A/V H, depression, anxiety, or pain at this time, and appears otherwise stable and free of distress but has fixed delusions of being pregnant. Pt reminded of camera surveillance, q 15 min rounds, and rules of the milieu. Will continue to assess.

## 2016-10-20 NOTE — ED Provider Notes (Signed)
[facility]-EMERGENCY DEPT Provider Note   CSN: [id number] Arrival date & time: 10/20/16  1351     History   Chief Complaint Chief Complaint  Patient presents with  . Aggressive Behavior    HPI Cheryl Cohen is a 26 y.o. female with a history of bipolar1, who presents on IVC by her mother for erratic aggressive behavior.  Chart review shows patient was seen here on 10/03/16 and admitted to [facility] with transfer [facility].  Patient continues to believe she is pregnant with twins but also admits that she vaginally delivered IUFD twins on 09-27-15.  Patient has had multiple negative pregnancy tests since.  She reports that she stopped taking her lithium and haldol because she read they can cause harm to the potentially baby.  Patient reports her LMP was December/january, however she was on her menstrual period when she was admitted on 10/03/16.  She reports that she has been experiencing nausea secondary to being pregnant but is not currently nauseous.  She denies physical pain.    Chart review shows she was pregnant with twins at 25 weeks 4 days by dates when she had a ultrasound on 09/08/15 which showed that both babies ([place]ise and [place]) did not have heartbeats and appeared to have been deceased for a while (measured around 20 weeks at final ultrasound).  She was then admitted to [facility] and gave birth to both deceased babies vaginally on 09/27/15 with following D&C for placental removal that same day.    HPI  Past Medical History:  Diagnosis Date  . Anxiety   . Bipolar 1 disorder (HCC)    hospitalized  3 x onset rx in 9th grade   . Depression   . Gonorrhea   . Hx of varicella   . Syphilis   . Twin pregnancy, delivered vaginally, IUFD stillborns 2015/09/27   IUFD  22 + weeks  still borns     Patient Active Problem List   Diagnosis Date Noted  . Bipolar affective disorder, mixed, severe (HCC) 10/04/2016  . Fetal demise, greater than 22 weeks, antepartum 09/08/2015  . Suicidal  ideation 10/06/2013  . Family history of diabetes mellitus type II 01/11/2012    Past Surgical History:  Procedure Laterality Date  . DILATION AND EVACUATION N/A 09/27/15   Procedure: DILATATION AND EVACUATION;  Surgeon: [name], MD;  Location: [facility];  Service: Gynecology;  Laterality: N/A;  . TYMPANOSTOMY TUBE PLACEMENT     asage 2-3 yrs    OB History    Gravida Para Term Preterm AB Living   1 1   1    0   SAB TAB Ectopic Multiple Live Births         1         Home Medications    Prior to Admission medications   Medication Sig Start Date End Date Taking? Authorizing Provider  ferrous sulfate 325 (65 FE) MG EC tablet Take 325 mg by mouth daily with breakfast.   Yes [provider]  lithium carbonate (ESKALITH) 450 MG CR tablet Take 225 mg by mouth at bedtime.   Yes [provider]  OLANZapine (ZYPREXA) 15 MG tablet Take 15 mg by mouth daily. 10/19/16  Yes [provider]  OLANZapine (ZYPREXA) 5 MG tablet Take 5 mg by mouth daily. 10/19/16  Yes [provider]  omega-3 acid ethyl esters (LOVAZA) 1 g capsule Take 1 g by mouth daily.   Yes [provider]  risperiDONE (RISPERDAL) 3 MG  tablet Take 1.5 mg by mouth at bedtime.   Yes [provider]    Family History Family History  Problem Relation Age of Onset  . Diabetes Mother   . Arthritis Mother   . Hypertension Father   . Alcohol abuse Father   . Alcoholism Father     Social History Social History  Substance Use Topics  . Smoking status: Former Smoker    Packs/day: 0.20    Types: Cigarettes  . Smokeless tobacco: Never Used     Comment: 1 cigarette a day  . Alcohol use No     Allergies   Amoxicillin   Review of Systems Review of Systems  Constitutional: Negative for chills and fever.  HENT: Negative for ear pain and sore throat.   Eyes: Negative for pain and visual disturbance.  Respiratory: Negative for cough and shortness of breath.     Cardiovascular: Negative for chest pain and palpitations.  Gastrointestinal: Positive for abdominal distention ("from my pregnancy"). Negative for abdominal pain, nausea and vomiting.  Genitourinary: Negative for dysuria and hematuria.  Musculoskeletal: Negative for arthralgias and back pain.  Skin: Negative for color change and rash.  Neurological: Negative for seizures and syncope.  All other systems reviewed and are negative.    Physical Exam Updated Vital Signs BP (!) 103/54 (BP Location: Left Arm)   Pulse 90   Temp 98.4 F (36.9 C) (Oral)   Resp 16   LMP 10/01/2016   SpO2 100%   Physical Exam  Constitutional: She appears well-developed and well-nourished.  HENT:  Head: Normocephalic and atraumatic.  Right Ear: External ear normal.  Eyes: Conjunctivae are normal. Right eye exhibits no discharge. Left eye exhibits no discharge. No scleral icterus.  Neck: No tracheal deviation present.  Cardiovascular: Normal rate, regular rhythm, normal heart sounds and intact distal pulses.   Pulmonary/Chest: Effort normal and breath sounds normal. No stridor. No respiratory distress. She has no wheezes.  Abdominal: Soft. Bowel sounds are normal. She exhibits distension (Mildly distended). There is no hepatosplenomegaly. There is no tenderness. There is no rigidity and no guarding.  Abdomen does not feel gravid.   Musculoskeletal: She exhibits no deformity.  Neurological: She is alert. She exhibits normal muscle tone.  Skin: Skin is warm and dry. She is not diaphoretic.  Psychiatric: Her affect is labile. Her speech is rapid and/or pressured and tangential. She is hyperactive. Thought content is delusional. Thought content is not paranoid. She expresses no homicidal and no suicidal ideation. She expresses no suicidal plans and no homicidal plans.  Patient singing when I entered the room  Nursing note and vitals reviewed.    ED Treatments / Results  Labs (all labs ordered are listed,  but only abnormal results are displayed) Labs Reviewed  CBC WITH DIFFERENTIAL/PLATELET - Abnormal; Notable for the following:       Result Value   HCT 35.0 (*)    Monocytes Absolute 1.4 (*)    All other components within normal limits  URINALYSIS, ROUTINE W REFLEX MICROSCOPIC - Abnormal; Notable for the following:    APPearance HAZY (*)    All other components within normal limits  LITHIUM LEVEL - Abnormal; Notable for the following:    Lithium Lvl <0.06 (*)    All other components within normal limits  COMPREHENSIVE METABOLIC PANEL  ETHANOL  RAPID URINE DRUG SCREEN, HOSP PERFORMED  POC URINE PREG, ED    EKG  EKG Interpretation None       Radiology No results  found.  Procedures Procedures (including critical care time)  Medications Ordered in ED Medications  OLANZapine (ZYPREXA) tablet 15 mg (not administered)  OLANZapine (ZYPREXA) tablet 5 mg (not administered)     Initial Impression / Assessment and Plan / ED Course  I have reviewed the triage vital signs and the nursing notes.  Pertinent labs & imaging results that were available during my care of the patient were reviewed by me and considered in my medical decision making (see chart for details).  Clinical Course as of Oct 20 1720  Fri Oct 20, 2016  1546 Spoke with TTS. Will order consult  [EH]    Clinical Course User Index [EH] Cristina GongHammond, Paddy Neis W, PA-C   Pt presents to the ED by IVC per her mother for reported erratic and aggressive behavior along with Delusions of still being pregnant.  Patient gave birth to twin girls Ward Givens(Alise and Denny Peonvery) at approximately 22 week after intrauterine fetal demise on 09/09/2015.  She continues to believe that she has pregnant with twins, despite mentioning that she knows her twins are "in a better place." She has had multiple negative pregnancy tests, including a negative one here today.  The patient currently does not have any acute physical complaints and is in no acute distress.  The patients demeanor is energetic, singing in her room, possibly manic. The patient was brought to ED by [employer] after her mother obtained a IVC.   Patient is medically clear at this time, pending TTS consult.   Final Clinical Impressions(s) / ED Diagnoses   Final diagnoses:  Medical clearance for psychiatric admission  Psychotic disorder with delusions    New Prescriptions New Prescriptions   No medications on file     Norman ClayHammond, Joanna Hall W, PA-C 10/20/16 1729    Arby BarrettePfeiffer, Marcy, MD 10/21/16 1058

## 2016-10-20 NOTE — ED Triage Notes (Signed)
Patient IVC'd by her mother for erratic aggressive behavior.  She continues to think she is pregnant after losing twins last year.  She is refusing to take her medications because she believes she is pregnant.  Patient with exaggerated movements, hyperactive but cooperative.

## 2016-10-21 ENCOUNTER — Encounter (HOSPITAL_COMMUNITY): Payer: Self-pay | Admitting: Registered Nurse

## 2016-10-21 DIAGNOSIS — Z811 Family history of alcohol abuse and dependence: Secondary | ICD-10-CM | POA: Diagnosis not present

## 2016-10-21 DIAGNOSIS — Z87891 Personal history of nicotine dependence: Secondary | ICD-10-CM | POA: Diagnosis not present

## 2016-10-21 DIAGNOSIS — F3163 Bipolar disorder, current episode mixed, severe, without psychotic features: Secondary | ICD-10-CM

## 2016-10-21 DIAGNOSIS — F29 Unspecified psychosis not due to a substance or known physiological condition: Secondary | ICD-10-CM

## 2016-10-21 MED ORDER — DIPHENHYDRAMINE HCL 50 MG/ML IJ SOLN: 25.0000 mg | Freq: Once | INTRAMUSCULAR | Status: DC

## 2016-10-21 MED ORDER — STERILE WATER FOR INJECTION IJ SOLN
INTRAMUSCULAR | Status: AC
  Administered 2016-10-21: 14:00:00
  Filled 2016-10-21: qty 10

## 2016-10-21 MED ORDER — DIPHENHYDRAMINE HCL 50 MG/ML IJ SOLN
25.0000 mg | Freq: Four times a day (QID) | INTRAMUSCULAR | Status: DC | PRN
  Administered 2016-10-21: 25 mg via INTRAMUSCULAR
  Filled 2016-10-21: qty 1

## 2016-10-21 MED ORDER — LORAZEPAM 2 MG/ML IJ SOLN
2.0000 mg | Freq: Four times a day (QID) | INTRAMUSCULAR | Status: DC | PRN
  Administered 2016-10-21: 2 mg via INTRAMUSCULAR
  Filled 2016-10-21: qty 1

## 2016-10-21 MED ORDER — ZIPRASIDONE MESYLATE 20 MG IM SOLR
10.0000 mg | Freq: Four times a day (QID) | INTRAMUSCULAR | Status: DC | PRN
  Administered 2016-10-21: 10 mg via INTRAMUSCULAR
  Filled 2016-10-21: qty 20

## 2016-10-21 MED ORDER — LORAZEPAM 2 MG/ML IJ SOLN: 2.0000 mg | Freq: Once | INTRAMUSCULAR | Status: DC

## 2016-10-21 MED ORDER — ZIPRASIDONE MESYLATE 20 MG IM SOLR: 10.0000 mg | Freq: Once | INTRAMUSCULAR | Status: DC

## 2016-10-21 NOTE — ED Notes (Signed)
Pt compliant with medication regimen. Pt behavior labile .Pt has fixed delusions on being pregnant. Pt not endorsing SI/HI/AVH. Special checks q 15 mins in place for safety, Video monitoring in place. Will continue to monitor.

## 2016-10-21 NOTE — ED Notes (Signed)
SBAR Report received from previous nurse. Pt received asleep on unit. Will continue to assess.

## 2016-10-21 NOTE — ED Notes (Signed)
Pt loud, verbally aggressive, verbally threatening, intrusive, dancing in hallway. Behavior impossible to redirect. Shuvon, NP notified of pt behavior. Will continue to monitor.

## 2016-10-21 NOTE — Progress Notes (Signed)
CSW filed patient's examination and recommendation paperwork into IVC logbook.  Julitza Rickles, LCSWA [facility]  Clinical Social Worker [phone] 

## 2016-10-21 NOTE — Consult Note (Signed)
[facility] Face-to-Face Psychiatry Consult   Reason for Consult:  Delusional Referring Physician:  EDP Patient Identification: Cheryl Cohen MRN:  [id number] Principal Diagnosis: Bipolar affective disorder, mixed, severe (HCC) Diagnosis:   Patient Active Problem List   Diagnosis Date Noted  . Bipolar affective disorder, mixed, severe (HCC) [F31.63] 10/04/2016  . Fetal demise, greater than 22 weeks, antepartum [O36.4XX0] 09/08/2015  . Suicidal ideation [R45.851] 10/06/2013  . Family history of diabetes mellitus type II [Z83.3] 01/11/2012    Total Time spent with patient: 45 minutes  Subjective:   Cheryl Cohen is a 26 y.o. female patient present to [facility] under IVC.  HPI:  Patient seen by Dr. Elsie Saas and this provider.  Chart reviewed and face to face evaluation on 10/21/16.   On evaluation  Cheryl Cohen reports that she is here because of her mother. Patient rambles about her mother, father, laughing when nothing is funny, agitated when speaking of mother.  Patient continues to say that she is pregnant and that she is not taking medications because she does not want to harm her babies.    Past Psychiatric History: Bipolar Disorder  Risk to Self: Suicidal Ideation: No Suicidal Intent: No Is patient at risk for suicide?: No Suicidal Plan?: No Access to Means: No What has been your use of drugs/alcohol within the last 12 months?: Patient denies How many times?: 0 Other Self Harm Risks: NA Triggers for Past Attempts: Unknown Intentional Self Injurious Behavior: None Comment - Self Injurious Behavior:  (None) Risk to Others: Homicidal Ideation: No Thoughts of Harm to Others: Yes-Currently Present (No H/I pt denies wanting to take partner's life "just hurt") Comment - Thoughts of Harm to Others: Patient reports she is upset with partner Current Homicidal Intent: No Current Homicidal Plan: No Describe Current Homicidal Plan:  (NA) Access to Homicidal Means:  No Identified Victim: Partner/Family per IVC History of harm to others?: No Assessment of Violence: None Noted Violent Behavior Description: NA Does patient have access to weapons?: No Criminal Charges Pending?: No Does patient have a court date: No Prior Inpatient Therapy: Prior Inpatient Therapy: Yes Prior Therapy Dates: 2018,2017 Prior Therapy Facilty/Provider(s): [facility] Prior Outpatient Therapy: Prior Outpatient Therapy: Yes Prior Therapy Dates: Current Prior Therapy Facilty/Provider(s): [facility] Reason for Treatment: [facility] Does patient have an ACCT team?: No Does patient have Intensive In-House Services?  : No Does patient have [facility] services? : Yes Does patient have P4CC services?: No  Past Medical History:  Past Medical History:  Diagnosis Date  . Anxiety   . Bipolar 1 disorder (HCC)    hospitalized  3 x onset rx in 9th grade   . Depression   . Gonorrhea   . Hx of varicella   . Syphilis   . Twin pregnancy, delivered vaginally, IUFD stillborns 09/09/2015   IUFD  22 + weeks  still borns     Past Surgical History:  Procedure Laterality Date  . DILATION AND EVACUATION N/A 09/09/2015   Procedure: DILATATION AND EVACUATION;  Surgeon: [name], MD;  Location: [facility];  Service: Gynecology;  Laterality: N/A;  . TYMPANOSTOMY TUBE PLACEMENT     asage 2-3 yrs   Family History:  Family History  Problem Relation Age of Onset  . Diabetes Mother   . Arthritis Mother   . Hypertension Father   . Alcohol abuse Father   . Alcoholism Father    Family Psychiatric  History: Father: Alcohol abuse Social History:  History  Alcohol Use  No     History  Drug Use No    Social History   Social History  . Marital status: Single    Spouse name: N/A  . Number of children: N/A  . Years of education: N/A   Social History Main Topics  . Smoking status: Former Smoker    Packs/day: 0.20    Types: Cigarettes  . Smokeless tobacco: Never Used      Comment: 1 cigarette a day  . Alcohol use No  . Drug use: No  . Sexual activity: No   Other Topics Concern  . None   Social History Narrative   hh of 4    Lives at home  [place] [facility] [place] for [place]  In past    Worked also [employer]   Pet dog   Neg tad some caffiene       Now living at home  Had been with [facility]   Additional Social History:    Allergies:   Allergies  Allergen Reactions  . Amoxicillin Rash and Other (See Comments)    Has patient had a PCN reaction causing immediate rash, facial/tongue/throat swelling, SOB or lightheadedness with hypotension: yes Has patient had a PCN reaction causing severe rash involving mucus membranes or skin necrosis: No Has patient had a PCN reaction that required hospitalization: No Has patient had a PCN reaction occurring within the last 10 years: Yes If all of the above answers are "NO", then may proceed with Cephalosporin use.     Labs:  Results for orders placed or performed during the hospital encounter of 10/20/16 (from the past 48 hour(s))  Urine rapid drug screen (hosp performed)     Status: None   Collection Time: 10/20/16  3:16 PM  Result Value Ref Range   Opiates NONE DETECTED NONE DETECTED   Cocaine NONE DETECTED NONE DETECTED   Benzodiazepines NONE DETECTED NONE DETECTED   Amphetamines NONE DETECTED NONE DETECTED   Tetrahydrocannabinol NONE DETECTED NONE DETECTED   Barbiturates NONE DETECTED NONE DETECTED    Comment:        DRUG SCREEN FOR MEDICAL PURPOSES ONLY.  IF CONFIRMATION IS NEEDED FOR ANY PURPOSE, NOTIFY LAB WITHIN 5 DAYS.        LOWEST DETECTABLE LIMITS FOR URINE DRUG SCREEN Drug Class       Cutoff (ng/mL) Amphetamine      1000 Barbiturate      200 Benzodiazepine   606 Tricyclics       301 Opiates          300 Cocaine          300 THC              50   Urinalysis, Routine w reflex microscopic     Status: Abnormal   Collection Time: 10/20/16  3:16 PM  Result Value Ref Range   Color, Urine  YELLOW YELLOW   APPearance HAZY (A) CLEAR   Specific Gravity, Urine 1.005 1.005 - 1.030   pH 6.0 5.0 - 8.0   Glucose, UA NEGATIVE NEGATIVE mg/dL   Hgb urine dipstick NEGATIVE NEGATIVE   Bilirubin Urine NEGATIVE NEGATIVE   Ketones, ur NEGATIVE NEGATIVE mg/dL   Protein, ur NEGATIVE NEGATIVE mg/dL   Nitrite NEGATIVE NEGATIVE   Leukocytes, UA NEGATIVE NEGATIVE  POC urine preg, ED     Status: None   Collection Time: 10/20/16  3:25 PM  Result Value Ref Range   Preg Test, Ur NEGATIVE NEGATIVE  Comment:        THE SENSITIVITY OF THIS METHODOLOGY IS >24 mIU/mL   Comprehensive metabolic panel     Status: None   Collection Time: 10/20/16  4:09 PM  Result Value Ref Range   Sodium 138 135 - 145 mmol/L   Potassium 3.6 3.5 - 5.1 mmol/L   Chloride 101 101 - 111 mmol/L   CO2 29 22 - 32 mmol/L   Glucose, Bld 87 65 - 99 mg/dL   BUN 12 6 - 20 mg/dL   Creatinine, Ser 0.69 0.44 - 1.00 mg/dL   Calcium 9.3 8.9 - 10.3 mg/dL   Total Protein 7.0 6.5 - 8.1 g/dL   Albumin 4.1 3.5 - 5.0 g/dL   AST 22 15 - 41 U/L   ALT 18 14 - 54 U/L   Alkaline Phosphatase 49 38 - 126 U/L   Total Bilirubin 0.5 0.3 - 1.2 mg/dL   GFR calc non Af Amer >60 >60 mL/min   GFR calc Af Amer >60 >60 mL/min    Comment: (NOTE) The eGFR has been calculated using the CKD EPI equation. This calculation has not been validated in all clinical situations. eGFR's persistently <60 mL/min signify possible Chronic Kidney Disease.    Anion gap 8 5 - 15  Ethanol     Status: None   Collection Time: 10/20/16  4:09 PM  Result Value Ref Range   Alcohol, Ethyl (B) <5 <5 mg/dL    Comment:        LOWEST DETECTABLE LIMIT FOR SERUM ALCOHOL IS 5 mg/dL FOR MEDICAL PURPOSES ONLY   CBC with Diff     Status: Abnormal   Collection Time: 10/20/16  4:09 PM  Result Value Ref Range   WBC 6.8 4.0 - 10.5 K/uL   RBC 4.03 3.87 - 5.11 MIL/uL   Hemoglobin 12.1 12.0 - 15.0 g/dL   HCT 35.0 (L) 36.0 - 46.0 %   MCV 86.8 78.0 - 100.0 fL   MCH 30.0 26.0  - 34.0 pg   MCHC 34.6 30.0 - 36.0 g/dL   RDW 12.5 11.5 - 15.5 %   Platelets 257 150 - 400 K/uL   Neutrophils Relative % 36 %   Neutro Abs 2.5 1.7 - 7.7 K/uL   Lymphocytes Relative 35 %   Lymphs Abs 2.4 0.7 - 4.0 K/uL   Monocytes Relative 20 %   Monocytes Absolute 1.4 (H) 0.1 - 1.0 K/uL   Eosinophils Relative 8 %   Eosinophils Absolute 0.5 0.0 - 0.7 K/uL   Basophils Relative 1 %   Basophils Absolute 0.0 0.0 - 0.1 K/uL  Lithium level     Status: Abnormal   Collection Time: 10/20/16  4:09 PM  Result Value Ref Range   Lithium Lvl <0.06 (L) 0.60 - 1.20 mmol/L    Current Facility-Administered Medications  Medication Dose Route Frequency Provider Last Rate Last Dose  . OLANZapine (ZYPREXA) tablet 15 mg  15 mg Oral Daily Lorin Glass, [place]   15 mg at 10/20/16 1723  . OLANZapine (ZYPREXA) tablet 5 mg  5 mg Oral Aldona Bar, [place]   5 mg at 10/21/16 0745   Current Outpatient Prescriptions  Medication Sig Dispense Refill  . ferrous sulfate 325 (65 FE) MG EC tablet Take 325 mg by mouth daily with breakfast.    . lithium carbonate (ESKALITH) 450 MG CR tablet Take 225 mg by mouth at bedtime.    Marland Kitchen OLANZapine (ZYPREXA) 15 MG tablet Take 15 mg by  mouth daily.  0  . OLANZapine (ZYPREXA) 5 MG tablet Take 5 mg by mouth daily.  0  . omega-3 acid ethyl esters (LOVAZA) 1 g capsule Take 1 g by mouth daily.    . risperiDONE (RISPERDAL) 3 MG tablet Take 1.5 mg by mouth at bedtime.      Musculoskeletal: Strength & Muscle Tone: within normal limits Gait & Station: normal Patient leans: N/A  Psychiatric Specialty Exam: Physical Exam  Nursing note and vitals reviewed. Constitutional: She is oriented to person, place, and time.  Neck: Normal range of motion.  Respiratory: Effort normal.  Musculoskeletal: Normal range of motion.  Neurological: She is alert and oriented to person, place, and time.  Psychiatric: Her mood appears anxious. Her speech is rapid and/or pressured.  Thought content is delusional. She expresses impulsivity.    Review of Systems  Psychiatric/Behavioral: Negative for suicidal ideas. Hallucinations: Delusional.  Thinks she is still pregnant with twins. The patient is nervous/anxious.   All other systems reviewed and are negative.   Blood pressure 115/75, pulse (!) 103, temperature 98.5 F (36.9 C), temperature source Oral, resp. rate 16, last menstrual period 10/01/2016, SpO2 99 %.There is no height or weight on file to calculate BMI.  General Appearance: Disheveled  Eye Contact:  Good  Speech:  Clear and Coherent and Pressured  Volume:  Normal  Mood:  Anxious and Irritable  Affect:  Labile  Thought Process:  Linear  Orientation:  Full (Time, Place, and Person)  Thought Content:  Delusions  Suicidal Thoughts:  No  Homicidal Thoughts:  No  Memory:  Immediate;   Fair Recent;   Fair Remote;   Fair  Judgement:  Impaired  Insight:  Fair  Psychomotor Activity:  Normal  Concentration:  Concentration: Fair and Attention Span: Fair  Recall:  [employer] of Knowledge:  Fair  Language:  Good  Akathisia:  No  Handed:  Right  AIMS (if indicated):     Assets:  Communication Skills Social Support  ADL's:  Intact  Cognition:  WNL  Sleep:        Treatment Plan Summary: Daily contact with patient to assess and evaluate symptoms and progress in treatment and Medication management Medication Restarted on: Zyprexa 5 mg Q morning and 15 mg Q evening for mood stabilization  Disposition: Recommend psychiatric Inpatient admission when medically cleared.  Rankin, Delphia Grates, NP 10/21/2016 11:50 AM   Patient seen, chart reviewed for this face-to-face psychiatric consultation and evaluation and case discussed with physician extender, and formulated treatment plan. Reviewed the information documented and agree with the treatment plan.  Priyansh Pry 10/21/2016 2:57 PM

## 2016-10-22 NOTE — Progress Notes (Signed)
Per psychiatrist Jonnalagadda and NP Rankin, patient continues to meet inpatient criteria. CSW faxed [facility]'s [facility], [facility].   Cheryl Cohen, [facility]  Clinical Social Worker [phone]

## 2016-10-22 NOTE — ED Notes (Signed)
SBAR Report received from previous nurse. Pt received calm and visible on unit. Pt denies current SI/ HI, A/V H, depression, anxiety, or pain at this time, and appears otherwise stable and free of distress with persistent dillusion of being pregnant.. Pt reminded of camera surveillance, q 15 min rounds, and rules of the milieu. Will continue to assess.

## 2016-10-22 NOTE — Progress Notes (Signed)
CSW contacted by staff member Novella Oliveaniel Lewis [facility] Per staff, [facility] by Dr. Kennedy BuckerYi-zhe after 10am. Patient's RN can call report to [phone].   Cheryl Cohen, LCSWA [facility]  Clinical Social Worker [phone]

## 2016-10-22 NOTE — [facility] Assessment (Signed)
[facility]  Clinician reassessed pt this morning. She states "I need to you converse with the doctor and give me a bus pass to leave. If not someone is going to be added to my hit list." She reports "Dr. Mervyn SkeetersA told me I was not pregnant. How can he tell a 26 year old woman she is not pregnant when that is reality." Pt pulled up shirt to show clinician her stomach. She reports "I lost twins last year and my mom has PTSD from that." Pt continues to meet inpatient criteria. TTS continues to seek placement.   Daisy FloroCandace L Alvina Strother MSW, LCSW  10/22/2016 10:33 AM

## 2016-10-22 NOTE — ED Notes (Signed)
Pt behavior labile, intrusive. Pt not endorsing SI/HI/AVH. Encouragement and support provided. Special checks q in place for safety, Video monitoring in place. Will continue to monitor.

## 2016-10-22 NOTE — ED Notes (Signed)
Pt taking a shower 

## 2016-10-23 DIAGNOSIS — Z87891 Personal history of nicotine dependence: Secondary | ICD-10-CM | POA: Diagnosis not present

## 2016-10-23 DIAGNOSIS — Z915 Personal history of self-harm: Secondary | ICD-10-CM | POA: Diagnosis not present

## 2016-10-23 DIAGNOSIS — Z008 Encounter for other general examination: Secondary | ICD-10-CM | POA: Diagnosis not present

## 2016-10-23 DIAGNOSIS — Z9114 Patient's other noncompliance with medication regimen: Secondary | ICD-10-CM | POA: Diagnosis not present

## 2016-10-23 DIAGNOSIS — F3163 Bipolar disorder, current episode mixed, severe, without psychotic features: Secondary | ICD-10-CM | POA: Diagnosis not present

## 2016-10-23 DIAGNOSIS — F310 Bipolar disorder, current episode hypomanic: Secondary | ICD-10-CM | POA: Diagnosis not present

## 2016-10-23 DIAGNOSIS — Z79899 Other long term (current) drug therapy: Secondary | ICD-10-CM | POA: Diagnosis not present

## 2016-10-23 DIAGNOSIS — F918 Other conduct disorders: Secondary | ICD-10-CM | POA: Diagnosis not present

## 2016-10-23 DIAGNOSIS — F319 Bipolar disorder, unspecified: Secondary | ICD-10-CM | POA: Diagnosis not present

## 2016-10-23 DIAGNOSIS — Z560 Unemployment, unspecified: Secondary | ICD-10-CM | POA: Diagnosis not present

## 2016-10-23 NOTE — ED Notes (Addendum)
Sheriff on unit to transport [facility] per MD order. Personal property given to sheriff for transfer. Pt ambulatory off unit in police custody. This nurse [facility] pt is on the way.

## 2016-10-23 NOTE — ED Notes (Signed)
Sheriff called for transport to [facility] °

## 2016-10-24 DIAGNOSIS — F310 Bipolar disorder, current episode hypomanic: Secondary | ICD-10-CM | POA: Diagnosis not present

## 2016-10-25 DIAGNOSIS — F310 Bipolar disorder, current episode hypomanic: Secondary | ICD-10-CM | POA: Diagnosis not present

## 2016-10-26 DIAGNOSIS — F310 Bipolar disorder, current episode hypomanic: Secondary | ICD-10-CM | POA: Diagnosis not present

## 2016-10-27 DIAGNOSIS — F310 Bipolar disorder, current episode hypomanic: Secondary | ICD-10-CM | POA: Diagnosis not present

## 2016-10-28 DIAGNOSIS — F310 Bipolar disorder, current episode hypomanic: Secondary | ICD-10-CM | POA: Diagnosis not present

## 2016-10-30 DIAGNOSIS — F310 Bipolar disorder, current episode hypomanic: Secondary | ICD-10-CM | POA: Diagnosis not present

## 2016-10-31 DIAGNOSIS — F310 Bipolar disorder, current episode hypomanic: Secondary | ICD-10-CM | POA: Diagnosis not present

## 2016-11-01 DIAGNOSIS — F310 Bipolar disorder, current episode hypomanic: Secondary | ICD-10-CM | POA: Diagnosis not present

## 2016-11-02 DIAGNOSIS — F310 Bipolar disorder, current episode hypomanic: Secondary | ICD-10-CM | POA: Diagnosis not present

## 2016-11-03 DIAGNOSIS — F310 Bipolar disorder, current episode hypomanic: Secondary | ICD-10-CM | POA: Diagnosis not present

## 2016-11-03 DIAGNOSIS — Z9114 Patient's other noncompliance with medication regimen: Secondary | ICD-10-CM | POA: Diagnosis not present

## 2016-11-03 DIAGNOSIS — Z915 Personal history of self-harm: Secondary | ICD-10-CM | POA: Diagnosis not present

## 2016-11-03 DIAGNOSIS — Z560 Unemployment, unspecified: Secondary | ICD-10-CM | POA: Diagnosis not present

## 2016-11-03 DIAGNOSIS — F319 Bipolar disorder, unspecified: Secondary | ICD-10-CM | POA: Diagnosis not present

## 2016-11-09 DIAGNOSIS — N925 Other specified irregular menstruation: Secondary | ICD-10-CM | POA: Diagnosis not present

## 2016-11-10 DIAGNOSIS — F319 Bipolar disorder, unspecified: Secondary | ICD-10-CM | POA: Diagnosis not present

## 2016-11-10 DIAGNOSIS — F909 Attention-deficit hyperactivity disorder, unspecified type: Secondary | ICD-10-CM | POA: Diagnosis not present

## 2016-11-17 DIAGNOSIS — F319 Bipolar disorder, unspecified: Secondary | ICD-10-CM | POA: Diagnosis not present

## 2016-11-23 DIAGNOSIS — Z3041 Encounter for surveillance of contraceptive pills: Secondary | ICD-10-CM | POA: Diagnosis not present

## 2016-11-23 DIAGNOSIS — F319 Bipolar disorder, unspecified: Secondary | ICD-10-CM | POA: Diagnosis not present

## 2016-11-23 DIAGNOSIS — N925 Other specified irregular menstruation: Secondary | ICD-10-CM | POA: Diagnosis not present

## 2016-11-23 DIAGNOSIS — F458 Other somatoform disorders: Secondary | ICD-10-CM | POA: Diagnosis not present

## 2016-12-05 DIAGNOSIS — F909 Attention-deficit hyperactivity disorder, unspecified type: Secondary | ICD-10-CM | POA: Diagnosis not present

## 2016-12-05 DIAGNOSIS — F319 Bipolar disorder, unspecified: Secondary | ICD-10-CM | POA: Diagnosis not present

## 2016-12-06 ENCOUNTER — Telehealth (HOSPITAL_COMMUNITY): Payer: Self-pay | Admitting: Internal Medicine

## 2016-12-07 DIAGNOSIS — F319 Bipolar disorder, unspecified: Secondary | ICD-10-CM | POA: Diagnosis not present

## 2016-12-14 NOTE — Progress Notes (Signed)
Chief Complaint  Patient presents with  . Dysuria    pain x 1 week. no blood. no burning with urination, only dull pain.     HPI: Cheryl Cohen 26 y.o. SDA   Onset about a week ago of and urination pain that is continuing. She is now on the second day of her period and he continues. No fever flank pain or vaginal symptoms. Would like an STI screen.  2 partners   In the recent past 1 without protection but low risk according to patient. No  ROS: See pertinent positives and negatives per HPI.  Past Medical History:  Diagnosis Date  . Anxiety   . Bipolar 1 disorder (HCC)    hospitalized  3 x onset rx in 9th grade   . Depression   . Gonorrhea   . Hx of varicella   . Syphilis   . Twin pregnancy, delivered vaginally, IUFD stillborns 09/09/2015   IUFD  22 + weeks  still borns     Family History  Problem Relation Age of Onset  . Diabetes Mother   . Arthritis Mother   . Hypertension Father   . Alcohol abuse Father   . Alcoholism Father     Social History   Social History  . Marital status: Single    Spouse name: N/A  . Number of children: N/A  . Years of education: N/A   Social History Main Topics  . Smoking status: Former Smoker    Packs/day: 0.20    Types: Cigarettes  . Smokeless tobacco: Never Used     Comment: 1 cigarette a day  . Alcohol use No  . Drug use: No  . Sexual activity: No   Other Topics Concern  . None   Social History Narrative   hh of 4    [place] at home  [place] [employer]   [employer] nursing  In past    Worked also [employer]   Pet dog   Neg tad some caffiene       Now living at home  Had been with vine    Outpatient Medications Prior to Visit  Medication Sig Dispense Refill  . ferrous sulfate 325 (65 FE) MG EC tablet Take 325 mg by mouth daily with breakfast.    . lithium carbonate (ESKALITH) 450 MG CR tablet Take 225 mg by mouth at bedtime.    Marland Kitchen. OLANZapine (ZYPREXA) 15 MG tablet Take 15 mg by mouth at bedtime.   0  . OLANZapine  (ZYPREXA) 5 MG tablet Take 5 mg by mouth daily.  0  . omega-3 acid ethyl esters (LOVAZA) 1 g capsule Take 1 g by mouth daily.    . risperiDONE (RISPERDAL) 3 MG tablet Take 1.5 mg by mouth at bedtime.     No facility-administered medications prior to visit.      EXAM:  BP 102/60 (BP Location: Right Arm, Patient Position: Sitting, Cuff Size: Normal)   Pulse 97   Temp 98.1 F (36.7 C) (Oral)   Wt 167 lb 3.2 oz (75.8 kg)   LMP 12/14/2016   BMI 26.19 kg/m   Body mass index is 26.19 kg/m.  GENERAL: vitals reviewed and listed above, alert, oriented, appears well hydrated and in no acute distress HEENT: atraumatic, conjunctiva  clear, no obvious abnormalities on inspection of external nose and ears  NECK: no obvious masses on inspection palpation  Abdomen:  Sof,t normal bowel sounds without hepatosplenomegaly, no guarding rebound or masses no CVA  tendernessCV: HRRR, no clubbing cyanosis or  peripheral edema nl cap refill  MS: moves all extremities without noticeable focal  abnormality PSYCH: pleasant and cooperative,  poct pos blood leuk prot ( on mensis ) ASSESSMENT AND PLAN:  Discussed the following assessment and plan:  Dysuria - Plan: Urine Culture, POC Urinalysis Dipstick, Urine Culture, GC/Chlamydia Probe Amp, CANCELED: GC/chlamydia probe amp, urine  Routine screening for STI (sexually transmitted infection) - Plan: Urine Culture, POC Urinalysis Dipstick, Urine Culture, GC/Chlamydia Probe Amp, CANCELED: GC/chlamydia probe amp, urine  Urinary tract infection with hematuria, site unspecified - Plan: Urine Culture, POC Urinalysis Dipstick, Urine Culture, GC/Chlamydia Probe Amp, CANCELED: GC/chlamydia probe amp, urine Could not produce another urine will send this for culture although could have vaginal contamination based on the. STI screen as discussed prevention most likely a garden-variety UTI based on history and context. Macrobid sent in expectant management STI protection in  future. dsic  -Patient advised to return or notify health care team  if symptoms worsen ,persist or new concerns arise.  Patient Instructions  You seem to have a UTI   Be screening for Gc and Chlamydia .  Contact us if not improving with medication.  Or as needed.     Urinary Tract Infection, Adult A urinary tract infection (UTI) is an infection of any part of the urinary tract, which includes the kidneys, ureters, bladder, and urethra. These organs make, store, and get rid of urine in the body. UTI can be a bladder infection (cystitis) or kidney infection (pyelonephritis). What are the causes? This infection may be caused by fungi, viruses, or bacteria. Bacteria are the most common cause of UTIs. This condition can also be caused by repeated incomplete emptying of the bladder during urination. What increases the risk? This condition is more likely to develop if:  You ignore your need to urinate or hold urine for long periods of time.  You do not empty your bladder completely during urination.  You wipe back to front after urinating or having a bowel movement, if you are female.  You are uncircumcised, if you are female.  You are constipated.  You have a urinary catheter that stays in place (indwelling).  You have a weak defense (immune) system.  You have a medical condition that affects your bowels, kidneys, or bladder.  You have diabetes.  You take antibiotic medicines frequently or for long periods of time, and the antibiotics no longer work well against certain types of infections (antibiotic resistance).  You take medicines that irritate your urinary tract.  You are exposed to chemicals that irritate your urinary tract.  You are female.  What are the signs or symptoms? Symptoms of this condition include:  Fever.  Frequent urination or passing small amounts of urine frequently.  Needing to urinate urgently.  Pain or burning with urination.  Urine that smells  bad or unusual.  Cloudy urine.  Pain in the lower abdomen or back.  Trouble urinating.  Blood in the urine.  Vomiting or being less hungry than normal.  Diarrhea or abdominal pain.  Vaginal discharge, if you are female.  How is this diagnosed? This condition is diagnosed with a medical history and physical exam. You will also need to provide a urine sample to test your urine. Other tests may be done, including:  Blood tests.  Sexually transmitted disease (STD) testing.  If you have had more than one UTI, a cystoscopy or imaging studies may be done to determine the cause of  the infections. How is this treated? Treatment for this condition often includes a combination of two or more of the following:  Antibiotic medicine.  Other medicines to treat less common causes of UTI.  Over-the-counter medicines to treat pain.  Drinking enough water to stay hydrated.  Follow these instructions at home:  Take over-the-counter and prescription medicines only as told by your health care provider.  If you were prescribed an antibiotic, take it as told by your health care provider. Do not stop taking the antibiotic even if you start to feel better.  Avoid alcohol, caffeine, tea, and carbonated beverages. They can irritate your bladder.  Drink enough fluid to keep your urine clear or pale yellow.  Keep all follow-up visits as told by your health care provider. This is important.  Make sure to: ? Empty your bladder often and completely. Do not hold urine for long periods of time. ? Empty your bladder before and after sex. ? Wipe from front to back after a bowel movement if you are female. Use each tissue one time when you wipe. Contact a health care provider if:  You have back pain.  You have a fever.  You feel nauseous or vomit.  Your symptoms do not get better after 3 days.  Your symptoms go away and then return. Get help right away if:  You have severe back pain or  lower abdominal pain.  You are vomiting and cannot keep down any medicines or water. This information is not intended to replace advice given to you by your health care provider. Make sure you discuss any questions you have with your health care provider. Document Released: 12/28/2004 Document Revised: 09/01/2015 Document Reviewed: 02/08/2015 Elsevier Interactive Patient Education  2017 [employer].     Jauca. Eaton Folmar M.D.

## 2016-12-15 ENCOUNTER — Ambulatory Visit (INDEPENDENT_AMBULATORY_CARE_PROVIDER_SITE_OTHER): Payer: BLUE CROSS/BLUE SHIELD | Admitting: Internal Medicine

## 2016-12-15 ENCOUNTER — Encounter: Payer: Self-pay | Admitting: Internal Medicine

## 2016-12-15 VITALS — BP 102/60 | HR 97 | Temp 98.1°F | Wt 167.2 lb

## 2016-12-15 DIAGNOSIS — N390 Urinary tract infection, site not specified: Secondary | ICD-10-CM | POA: Diagnosis not present

## 2016-12-15 DIAGNOSIS — R300 Dysuria: Secondary | ICD-10-CM

## 2016-12-15 DIAGNOSIS — Z113 Encounter for screening for infections with a predominantly sexual mode of transmission: Secondary | ICD-10-CM

## 2016-12-15 DIAGNOSIS — R319 Hematuria, unspecified: Secondary | ICD-10-CM | POA: Diagnosis not present

## 2016-12-15 LAB — POCT URINALYSIS DIPSTICK
Bilirubin, UA: NEGATIVE
GLUCOSE UA: NEGATIVE
KETONES UA: NEGATIVE
Nitrite, UA: NEGATIVE
Spec Grav, UA: 1.015 (ref 1.010–1.025)
Urobilinogen, UA: 1 E.U./dL
pH, UA: 7.5 (ref 5.0–8.0)

## 2016-12-15 MED ORDER — NITROFURANTOIN MONOHYD MACRO 100 MG PO CAPS: 100.0000 mg | ORAL_CAPSULE | Freq: Two times a day (BID) | ORAL | 0 refills | Status: AC

## 2016-12-15 NOTE — Patient Instructions (Signed)
You seem to have a UTI   Be screening for Gc and Chlamydia .  Contact us if not improving with medication.  Or as needed.     Urinary Tract Infection, Adult A urinary tract infection (UTI) is an infection of any part of the urinary tract, which includes the kidneys, ureters, bladder, and urethra. These organs make, store, and get rid of urine in the body. UTI can be a bladder infection (cystitis) or kidney infection (pyelonephritis). What are the causes? This infection may be caused by fungi, viruses, or bacteria. Bacteria are the most common cause of UTIs. This condition can also be caused by repeated incomplete emptying of the bladder during urination. What increases the risk? This condition is more likely to develop if:  You ignore your need to urinate or hold urine for long periods of time.  You do not empty your bladder completely during urination.  You wipe back to front after urinating or having a bowel movement, if you are female.  You are uncircumcised, if you are female.  You are constipated.  You have a urinary catheter that stays in place (indwelling).  You have a weak defense (immune) system.  You have a medical condition that affects your bowels, kidneys, or bladder.  You have diabetes.  You take antibiotic medicines frequently or for long periods of time, and the antibiotics no longer work well against certain types of infections (antibiotic resistance).  You take medicines that irritate your urinary tract.  You are exposed to chemicals that irritate your urinary tract.  You are female.  What are the signs or symptoms? Symptoms of this condition include:  Fever.  Frequent urination or passing small amounts of urine frequently.  Needing to urinate urgently.  Pain or burning with urination.  Urine that smells bad or unusual.  Cloudy urine.  Pain in the lower abdomen or back.  Trouble urinating.  Blood in the urine.  Vomiting or being less  hungry than normal.  Diarrhea or abdominal pain.  Vaginal discharge, if you are female.  How is this diagnosed? This condition is diagnosed with a medical history and physical exam. You will also need to provide a urine sample to test your urine. Other tests may be done, including:  Blood tests.  Sexually transmitted disease (STD) testing.  If you have had more than one UTI, a cystoscopy or imaging studies may be done to determine the cause of the infections. How is this treated? Treatment for this condition often includes a combination of two or more of the following:  Antibiotic medicine.  Other medicines to treat less common causes of UTI.  Over-the-counter medicines to treat pain.  Drinking enough water to stay hydrated.  Follow these instructions at home:  Take over-the-counter and prescription medicines only as told by your health care provider.  If you were prescribed an antibiotic, take it as told by your health care provider. Do not stop taking the antibiotic even if you start to feel better.  Avoid alcohol, caffeine, tea, and carbonated beverages. They can irritate your bladder.  Drink enough fluid to keep your urine clear or pale yellow.  Keep all follow-up visits as told by your health care provider. This is important.  Make sure to: ? Empty your bladder often and completely. Do not hold urine for long periods of time. ? Empty your bladder before and after sex. ? Wipe from front to back after a bowel movement if you are female. Use each tissue one  time when you wipe. Contact a health care provider if:  You have back pain.  You have a fever.  You feel nauseous or vomit.  Your symptoms do not get better after 3 days.  Your symptoms go away and then return. Get help right away if:  You have severe back pain or lower abdominal pain.  You are vomiting and cannot keep down any medicines or water. This information is not intended to replace advice given  to you by your health care provider. Make sure you discuss any questions you have with your health care provider. Document Released: 12/28/2004 Document Revised: 09/01/2015 Document Reviewed: 02/08/2015 Elsevier Interactive Patient Education  2017 [employer].

## 2016-12-17 LAB — URINE CULTURE
MICRO NUMBER:: 81017439
SPECIMEN QUALITY:: ADEQUATE

## 2016-12-18 LAB — GC/CHLAMYDIA PROBE AMP
Chlamydia trachomatis, NAA: NEGATIVE
NEISSERIA GONORRHOEAE BY PCR: NEGATIVE

## 2016-12-18 NOTE — Progress Notes (Signed)
Tell patient that urine culture shows e coli  sensitive to medication given . Should resolve with current treatment  sti screen is negative .FU if not better.

## 2017-01-09 ENCOUNTER — Ambulatory Visit (INDEPENDENT_AMBULATORY_CARE_PROVIDER_SITE_OTHER): Payer: BLUE CROSS/BLUE SHIELD | Admitting: Physician Assistant

## 2017-01-09 VITALS — BP 100/66 | HR 103 | Temp 98.4°F | Resp 16 | Ht 67.0 in | Wt 165.2 lb

## 2017-01-09 DIAGNOSIS — J029 Acute pharyngitis, unspecified: Secondary | ICD-10-CM | POA: Diagnosis not present

## 2017-01-09 DIAGNOSIS — J020 Streptococcal pharyngitis: Secondary | ICD-10-CM | POA: Diagnosis not present

## 2017-01-09 DIAGNOSIS — R000 Tachycardia, unspecified: Secondary | ICD-10-CM | POA: Diagnosis not present

## 2017-01-09 LAB — POCT RAPID STREP A (OFFICE): Rapid Strep A Screen: POSITIVE — AB

## 2017-01-09 MED ORDER — CEPHALEXIN 500 MG PO CAPS: 500.0000 mg | ORAL_CAPSULE | Freq: Two times a day (BID) | ORAL | 0 refills | Status: AC

## 2017-01-09 MED ORDER — AMBULATORY NON FORMULARY MEDICATION: 1 refills | Status: DC

## 2017-01-09 NOTE — Patient Instructions (Addendum)
Keflex - take 500 mg every 12 hours for 10 days. Take the ENTIRE COURSE of medication, even if you start to feel better!  Take Advil/ibuprofen for pain.   Come back if you are not improving in 5-7 days.  Strep Throat Strep throat is an infection of the throat. It is caused by germs. Strep throat spreads from person to person because of coughing, sneezing, or close contact. Follow these instructions at home: Medicines  Take over-the-counter and prescription medicines only as told by your doctor.  Take your antibiotic medicine as told by your doctor. Do not stop taking the medicine even if you feel better.  Have family members who also have a sore throat or fever go to a doctor. Eating and drinking  Do not share food, drinking cups, or personal items.  Try eating soft foods until your sore throat feels better.  Drink enough fluid to keep your pee (urine) clear or pale yellow. General instructions  Rinse your mouth (gargle) with a salt-water mixture 3-4 times per day or as needed. To make a salt-water mixture, stir -1 tsp of salt into 1 cup of warm water.  Make sure that all people in your house wash their hands well.  Rest.  Stay home from school or work until you have been taking antibiotics for 24 hours.  Keep all follow-up visits as told by your doctor. This is important. Contact a doctor if:  Your neck keeps getting bigger.  You get a rash, cough, or earache.  You cough up thick liquid that is green, yellow-brown, or bloody.  You have pain that does not get better with medicine.  Your problems get worse instead of getting better.  You have a fever. Get help right away if:  You throw up (vomit).  You get a very bad headache.  You neck hurts or it feels stiff.  You have chest pain or you are short of breath.  You have drooling, very bad throat pain, or changes in your voice.  Your neck is swollen or the skin gets red and tender.  Your mouth is dry or you  are peeing less than normal.  You keep feeling more tired or it is hard to wake up.  Your joints are red or they hurt. This information is not intended to replace advice given to you by your health care provider. Make sure you discuss any questions you have with your health care provider. Document Released: 09/06/2007 Document Revised: 11/17/2015 Document Reviewed: 07/13/2014 Elsevier Interactive Patient Education  2018 [employer].  Cephalexin tablets or capsules What is this medicine? CEPHALEXIN (sef a LEX in) is a cephalosporin antibiotic. It is used to treat certain kinds of bacterial infections It will not work for colds, flu, or other viral infections. This medicine may be used for other purposes; ask your health care provider or pharmacist if you have questions. COMMON BRAND NAME(S): Biocef, Daxbia, Keflex, Keftab What should I tell my health care provider before I take this medicine? They need to know if you have any of these conditions: -kidney disease -stomach or intestine problems, especially colitis -an unusual or allergic reaction to cephalexin, other cephalosporins, penicillins, other antibiotics, medicines, foods, dyes or preservatives -pregnant or trying to get pregnant -breast-feeding How should I use this medicine? Take this medicine by mouth with a full glass of water. Follow the directions on the prescription label. This medicine can be taken with or without food. Take your medicine at regular intervals. Do not take  your medicine more often than directed. Take all of your medicine as directed even if you think you are better. Do not skip doses or stop your medicine early. Talk to your pediatrician regarding the use of this medicine in children. While this drug may be prescribed for selected conditions, precautions do apply. Overdosage: If you think you have taken too much of this medicine contact a poison control center or emergency room at once. NOTE: This medicine is  only for you. Do not share this medicine with others. What if I miss a dose? If you miss a dose, take it as soon as you can. If it is almost time for your next dose, take only that dose. Do not take double or extra doses. There should be at least 4 to 6 hours between doses. What may interact with this medicine? -probenecid -some other antibiotics This list may not describe all possible interactions. Give your health care provider a list of all the medicines, herbs, non-prescription drugs, or dietary supplements you use. Also tell them if you smoke, drink alcohol, or use illegal drugs. Some items may interact with your medicine. What should I watch for while using this medicine? Tell your doctor or health care professional if your symptoms do not begin to improve in a few days. Do not treat diarrhea with over the counter products. Contact your doctor if you have diarrhea that lasts more than 2 days or if it is severe and watery. If you have diabetes, you may get a false-positive result for sugar in your urine. Check with your doctor or health care professional. What side effects may I notice from receiving this medicine? Side effects that you should report to your doctor or health care professional as soon as possible: -allergic reactions like skin rash, itching or hives, swelling of the face, lips, or tongue -breathing problems -pain or trouble passing urine -redness, blistering, peeling or loosening of the skin, including inside the mouth -severe or watery diarrhea -unusually weak or tired -yellowing of the eyes, skin Side effects that usually do not require medical attention (report to your doctor or health care professional if they continue or are bothersome): -gas or heartburn -genital or anal irritation -headache -joint or muscle pain -nausea, vomiting This list may not describe all possible side effects. Call your doctor for medical advice about side effects. You may report side  effects to FDA at 1-800-FDA-1088. Where should I keep my medicine? Keep out of the reach of children. Store at room temperature between 59 and 86 degrees F (15 and 30 degrees C). Throw away any unused medicine after the expiration date. NOTE: This sheet is a summary. It may not cover all possible information. If you have questions about this medicine, talk to your doctor, pharmacist, or health care provider.  2018 Elsevier/Gold Standard (2007-06-24 17:09:13)  IF you received an x-ray today, you will receive an invoice from [facility]. Please contact [facility] at [phone] with questions or concerns regarding your invoice.   IF you received labwork today, you will receive an invoice from [facility]. Please contact [facility] at [phone] with questions or concerns regarding your invoice.   Our billing staff will not be able to assist you with questions regarding bills from these companies.  You will be contacted with the lab results as soon as they are available. The fastest way to get your results is to activate your My Chart account. Instructions are located on the last page of this paperwork. If you  have not heard from [place] regarding the results in 2 weeks, please contact this office.

## 2017-01-09 NOTE — Progress Notes (Signed)
Cheryl Cohen  MRN: [id number] DOB: Jan 08, 1991  PCP: [name], MD  Subjective:  Pt is a 26 year old female who presents to clinic for sore throat x 2 days.  "sharp/dull pain". 8/10 pain. Pain with swallowing.  She has not taken anything to feel better. Strep + sick contact at work.  Denies fever, chills, cough, palpitations, rash.   Review of Systems  Constitutional: Negative for chills, diaphoresis, fatigue and fever.  HENT: Positive for sore throat and trouble swallowing. Negative for congestion, postnasal drip, rhinorrhea, sinus pressure and sneezing.   Respiratory: Negative for cough.   Gastrointestinal: Negative for abdominal pain, diarrhea, nausea and vomiting.  Skin: Negative for rash.    Patient Active Problem List   Diagnosis Date Noted  . Psychotic disorder with delusions (HCC)   . Bipolar affective disorder, mixed, severe (HCC) 10/04/2016  . Fetal demise, greater than 22 weeks, antepartum 09/08/2015  . Suicidal ideation 10/06/2013  . Family history of diabetes mellitus type II 01/11/2012    Current Outpatient Prescriptions on File Prior to Visit  Medication Sig Dispense Refill  . ferrous sulfate 325 (65 FE) MG EC tablet Take 325 mg by mouth daily with breakfast.    . lithium carbonate (ESKALITH) 450 MG CR tablet Take 225 mg by mouth at bedtime.    Marland Kitchen OLANZapine (ZYPREXA) 15 MG tablet Take 15 mg by mouth at bedtime.   0  . OLANZapine (ZYPREXA) 5 MG tablet Take 5 mg by mouth daily.  0  . omega-3 acid ethyl esters (LOVAZA) 1 g capsule Take 1 g by mouth daily.    . risperiDONE (RISPERDAL) 3 MG tablet Take 1.5 mg by mouth at bedtime.     No current facility-administered medications on file prior to visit.     Allergies  Allergen Reactions  . Amoxicillin Rash and Other (See Comments)    Has patient had a PCN reaction causing immediate rash, facial/tongue/throat swelling, SOB or lightheadedness with hypotension: yes Has patient had a PCN reaction causing  severe rash involving mucus membranes or skin necrosis: No Has patient had a PCN reaction that required hospitalization: No Has patient had a PCN reaction occurring within the last 10 years: Yes If all of the above answers are "NO", then may proceed with Cephalosporin use.      Objective:  BP 100/66   Pulse (!) 104   Temp 98.4 F (36.9 C) (Oral)   Resp 16   Ht  (1.702 m)   Wt 165 lb 3.2 oz (74.9 kg)   LMP 12/14/2016   SpO2 97%   BMI 25.87 kg/m   Physical Exam  Constitutional: She is oriented to person, place, and time and well-developed, well-nourished, and in no distress. No distress.  HENT:  Right Ear: Tympanic membrane normal.  Left Ear: Tympanic membrane normal.  Mouth/Throat: Mucous membranes are normal. Oropharyngeal exudate, posterior oropharyngeal edema and posterior oropharyngeal erythema present.  Cardiovascular: Normal rate, regular rhythm and normal heart sounds.   Neurological: She is alert and oriented to person, place, and time. GCS score is 15.  Skin: Skin is warm and dry.  Psychiatric: Mood, memory, affect and judgment normal.  Vitals reviewed.   Results for orders placed or performed in visit on 01/09/17  POCT rapid strep A  Result Value Ref Range   Rapid Strep A Screen Positive (A) Negative    Assessment and Plan :  1. Sore throat 2. Streptococcal sore throat 3. Tachycardia - POCT rapid strep  A - AMBULATORY NON FORMULARY MEDICATION; MIX: 90 ml Cherry Mylanta, 90 ml Cherry Benadryl, 30 ml Cherry Hurricane liquid. 5-10 ml PO swish and spit or swallow, Q2 hours, prn sore throat.  Dispense: 210 mL; Refill: 1 - Rapid strep is positive. Amoxicillin allergy (rash and angioedema), plan to treat with Keflex. Advil for pain. OOW note written for today. RTC if no improvement or symptoms worsen.   Cheryl Collie, PA-C  Primary Care at [facility] 01/09/2017 2:55 PM

## 2017-01-17 DIAGNOSIS — F319 Bipolar disorder, unspecified: Secondary | ICD-10-CM | POA: Diagnosis not present

## 2017-01-17 DIAGNOSIS — F909 Attention-deficit hyperactivity disorder, unspecified type: Secondary | ICD-10-CM | POA: Diagnosis not present

## 2017-01-18 DIAGNOSIS — F319 Bipolar disorder, unspecified: Secondary | ICD-10-CM | POA: Diagnosis not present

## 2017-01-18 DIAGNOSIS — F909 Attention-deficit hyperactivity disorder, unspecified type: Secondary | ICD-10-CM | POA: Diagnosis not present

## 2017-01-22 ENCOUNTER — Ambulatory Visit (INDEPENDENT_AMBULATORY_CARE_PROVIDER_SITE_OTHER): Payer: BLUE CROSS/BLUE SHIELD | Admitting: Family Medicine

## 2017-01-22 ENCOUNTER — Encounter: Payer: Self-pay | Admitting: Family Medicine

## 2017-01-22 VITALS — BP 100/58 | HR 102 | Temp 98.8°F | Resp 16 | Ht 66.0 in | Wt 162.8 lb

## 2017-01-22 DIAGNOSIS — J029 Acute pharyngitis, unspecified: Secondary | ICD-10-CM | POA: Diagnosis not present

## 2017-01-22 DIAGNOSIS — J020 Streptococcal pharyngitis: Secondary | ICD-10-CM

## 2017-01-22 LAB — POCT RAPID STREP A (OFFICE): RAPID STREP A SCREEN: POSITIVE — AB

## 2017-01-22 MED ORDER — CEPHALEXIN 500 MG PO CAPS: 500.0000 mg | ORAL_CAPSULE | Freq: Four times a day (QID) | ORAL | 0 refills | Status: DC

## 2017-01-22 NOTE — Patient Instructions (Addendum)
Drink plenty of fluids and get enough rest  Take cephalexinTake cephalexin d 500 mg one twice daily  Return if worse  At 1 hour was exposed to    IF you received an x-ray today, you will receive an invoice from [facility]. Please contact [facility] at [phone] with questions or concerns regarding your invoice.   IF you received labwork today, you will receive an invoice from [facility]. Please contact [facility] at [phone] with questions or concerns regarding your invoice.   Our billing staff will not be able to assist you with questions regarding bills from these companies.  You will be contacted with the lab results as soon as they are available. The fastest way to get your results is to activate your My Chart account. Instructions are located on the last page of this paperwork. If you have not heard from us regarding the results in 2 weeks, please contact this office.

## 2017-01-22 NOTE — Progress Notes (Signed)
AP and patient ID: Cheryl Cohen, female    DOB: 07-09-1990  Age: 26 y.o. MRN: [id number]  Chief Complaint  Patient presents with  . Sore Throat    x 2 days    Subjective:   26 year old lady who is been having a sore throat since she got up this morning. She is not having any fever that she knows of. She is not had a headache or stomach problems. No runny nose, sneezing, or coughing. She does have a history of having had strep in the past. She is otherwise generally healthy but is on some psychotropic medications for bipolar.  She lives with her mother and brother, neither of whom are ill. 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      Current allergies, medications, problem list, past/family and social histories reviewed.  Objective:  BP (!) 100/58 (BP Location: Right Arm, Patient Position: Sitting, Cuff Size: Normal)   Pulse (!) 102   Temp 98.8 F (37.1 C) (Oral)   Resp 16   Ht 5\' 6"  (1.676 m)   Wt 162 lb 12.8 oz (73.8 kg)   LMP 01/01/2017   SpO2 96%   BMI 26.28 kg/m   No major acute distress. TMs normal. Nose clear. Throat slightly red without exudate. Neck supple with some small nodes. Chest is clear to auscultation. Heart regular without murmur.  Assessment & Plan:   Assessment: 1. Streptococcal sore throat   2. Acute pharyngitis, unspecified etiology       Plan: Strep test and backup culture swabbed.  Orders Placed This Encounter  Procedures  . POCT rapid strep A    Meds ordered this encounter  Medications  . cephALEXin (KEFLEX) 500 MG capsule    Sig: Take 1 capsule (500 mg total) by mouth 4 (four) times daily.    Dispense:  20 capsule    Refill:  0         Patient Instructions   Drink plenty of fluids and get enough rest  Take cephalexinTake cephalexin d 500 mg one twice daily  Return if worse  At 1 hour was exposed to    IF you received an x-ray today, you will receive an invoice  from [facility]. Please contact [facility] at [phone] with questions or concerns regarding your invoice.   IF you received labwork today, you will receive an invoice from [facility]. Please contact [facility] at [phone] with questions or concerns regarding your invoice.   Our billing staff will not be able to assist you with questions regarding bills from these companies.  You will be contacted with the lab results as soon as they are available. The fastest way to get your results is to activate your My Chart account. Instructions are located on the last page of this paperwork. If you have not heard from us regarding the results in 2 weeks, please contact this office.        Return if symptoms worsen or fail to improve.   Tanganika Barradas, MD 01/22/2017

## 2017-01-30 ENCOUNTER — Ambulatory Visit (HOSPITAL_COMMUNITY): Payer: Self-pay | Admitting: Psychiatry

## 2017-03-01 DIAGNOSIS — N898 Other specified noninflammatory disorders of vagina: Secondary | ICD-10-CM | POA: Diagnosis not present

## 2017-03-01 DIAGNOSIS — B373 Candidiasis of vulva and vagina: Secondary | ICD-10-CM | POA: Diagnosis not present

## 2017-03-01 DIAGNOSIS — N760 Acute vaginitis: Secondary | ICD-10-CM | POA: Diagnosis not present

## 2017-03-15 DIAGNOSIS — F909 Attention-deficit hyperactivity disorder, unspecified type: Secondary | ICD-10-CM | POA: Diagnosis not present

## 2017-03-15 DIAGNOSIS — F319 Bipolar disorder, unspecified: Secondary | ICD-10-CM | POA: Diagnosis not present

## 2017-04-06 DIAGNOSIS — F909 Attention-deficit hyperactivity disorder, unspecified type: Secondary | ICD-10-CM | POA: Diagnosis not present

## 2017-04-06 DIAGNOSIS — F319 Bipolar disorder, unspecified: Secondary | ICD-10-CM | POA: Diagnosis not present

## 2017-08-02 DIAGNOSIS — F319 Bipolar disorder, unspecified: Secondary | ICD-10-CM | POA: Diagnosis not present

## 2017-08-02 DIAGNOSIS — F909 Attention-deficit hyperactivity disorder, unspecified type: Secondary | ICD-10-CM | POA: Diagnosis not present

## 2017-10-09 DIAGNOSIS — F909 Attention-deficit hyperactivity disorder, unspecified type: Secondary | ICD-10-CM | POA: Diagnosis not present

## 2017-10-09 DIAGNOSIS — F319 Bipolar disorder, unspecified: Secondary | ICD-10-CM | POA: Diagnosis not present

## 2017-11-09 DIAGNOSIS — R82998 Other abnormal findings in urine: Secondary | ICD-10-CM | POA: Diagnosis not present

## 2017-11-09 DIAGNOSIS — Z01419 Encounter for gynecological examination (general) (routine) without abnormal findings: Secondary | ICD-10-CM | POA: Diagnosis not present

## 2017-11-19 DIAGNOSIS — R82998 Other abnormal findings in urine: Secondary | ICD-10-CM | POA: Diagnosis not present

## 2018-02-04 DIAGNOSIS — Z3201 Encounter for pregnancy test, result positive: Secondary | ICD-10-CM | POA: Diagnosis not present

## 2018-02-08 DIAGNOSIS — F909 Attention-deficit hyperactivity disorder, unspecified type: Secondary | ICD-10-CM | POA: Diagnosis not present

## 2018-02-08 DIAGNOSIS — F319 Bipolar disorder, unspecified: Secondary | ICD-10-CM | POA: Diagnosis not present

## 2018-02-19 DIAGNOSIS — F319 Bipolar disorder, unspecified: Secondary | ICD-10-CM | POA: Diagnosis not present

## 2018-02-19 DIAGNOSIS — Z3009 Encounter for other general counseling and advice on contraception: Secondary | ICD-10-CM | POA: Diagnosis not present

## 2018-02-25 DIAGNOSIS — Z6826 Body mass index (BMI) 26.0-26.9, adult: Secondary | ICD-10-CM | POA: Diagnosis not present

## 2018-02-25 DIAGNOSIS — Z1389 Encounter for screening for other disorder: Secondary | ICD-10-CM | POA: Diagnosis not present

## 2018-02-25 DIAGNOSIS — Z01419 Encounter for gynecological examination (general) (routine) without abnormal findings: Secondary | ICD-10-CM | POA: Diagnosis not present

## 2018-02-25 DIAGNOSIS — Z113 Encounter for screening for infections with a predominantly sexual mode of transmission: Secondary | ICD-10-CM | POA: Diagnosis not present

## 2018-02-25 DIAGNOSIS — Z124 Encounter for screening for malignant neoplasm of cervix: Secondary | ICD-10-CM | POA: Diagnosis not present

## 2018-02-25 DIAGNOSIS — Z1151 Encounter for screening for human papillomavirus (HPV): Secondary | ICD-10-CM | POA: Diagnosis not present

## 2018-02-27 DIAGNOSIS — A549 Gonococcal infection, unspecified: Secondary | ICD-10-CM | POA: Diagnosis not present

## 2018-05-11 ENCOUNTER — Ambulatory Visit (INDEPENDENT_AMBULATORY_CARE_PROVIDER_SITE_OTHER): Payer: BLUE CROSS/BLUE SHIELD | Admitting: Family Medicine

## 2018-05-11 VITALS — BP 104/68 | HR 88 | Temp 98.1°F | Wt 168.0 lb

## 2018-05-11 DIAGNOSIS — J029 Acute pharyngitis, unspecified: Secondary | ICD-10-CM | POA: Diagnosis not present

## 2018-05-11 DIAGNOSIS — J020 Streptococcal pharyngitis: Secondary | ICD-10-CM

## 2018-05-11 LAB — POCT RAPID STREP A (OFFICE): RAPID STREP A SCREEN: POSITIVE — AB

## 2018-05-11 MED ORDER — CEPHALEXIN 500 MG PO CAPS: 500.0000 mg | ORAL_CAPSULE | Freq: Two times a day (BID) | ORAL | 0 refills | Status: AC

## 2018-05-11 NOTE — Progress Notes (Signed)
Cheryl Cohen DOB: 09-27-1990 Encounter date: 05/11/2018  This is a 28 y.o. female who presents with Chief Complaint  Patient presents with  . Sore Throat    x 4 days...Marland Kitchen painful to swallow.... denies fever or chills...pt has tried OTC ibuprofen and Robitussin with no relief  . Generalized Body Aches    History of present illness:  NO other sick sx besides what is listed in chief complaint.   Feels like when she has had strep before. No known exposures.     Allergies  Allergen Reactions  . Amoxicillin Rash and Other (See Comments)    Has patient had a PCN reaction causing immediate rash, facial/tongue/throat swelling, SOB or lightheadedness with hypotension: yes Has patient had a PCN reaction causing severe rash involving mucus membranes or skin necrosis: No Has patient had a PCN reaction that required hospitalization: No Has patient had a PCN reaction occurring within the last 10 years: Yes If all of the above answers are "NO", then may proceed with Cephalosporin use.    Current Meds  Medication Sig  . AMBULATORY NON FORMULARY MEDICATION MIX: 90 ml Cherry Mylanta, 90 ml Cherry Benadryl, 30 ml Cherry Hurricane liquid.  5-10 ml PO swish and spit or swallow, Q2 hours, prn sore throat.  . ferrous sulfate 325 (65 FE) MG EC tablet Take 325 mg by mouth daily with breakfast.  . lithium carbonate (ESKALITH) 450 MG CR tablet Take 225 mg by mouth at bedtime.  Marland Kitchen OLANZapine (ZYPREXA) 15 MG tablet Take 15 mg by mouth at bedtime.   Marland Kitchen OLANZapine (ZYPREXA) 5 MG tablet Take 5 mg by mouth daily.  Marland Kitchen omega-3 acid ethyl esters (LOVAZA) 1 g capsule Take 1 g by mouth daily.  . risperiDONE (RISPERDAL) 3 MG tablet Take 1.5 mg by mouth at bedtime.  . [DISCONTINUED] cephALEXin (KEFLEX) 500 MG capsule Take 1 capsule (500 mg total) by mouth 4 (four) times daily.    Review of Systems  Constitutional: Negative for chills, fatigue and fever.  HENT: Positive for sore throat, trouble swallowing and  voice change.   Respiratory: Negative for cough, chest tightness, shortness of breath and wheezing.   Cardiovascular: Negative for chest pain, palpitations and leg swelling.  Gastrointestinal: Negative for abdominal pain.    Objective:  BP 104/68   Pulse 88   Temp 98.1 F (36.7 C) (Oral)   Wt 168 lb (76.2 kg)   BMI 27.12 kg/m   Weight: 168 lb (76.2 kg)   BP Readings from Last 3 Encounters:  05/11/18 104/68  01/22/17 (!) 100/58  01/09/17 100/66   Wt Readings from Last 3 Encounters:  05/11/18 168 lb (76.2 kg)  01/22/17 162 lb 12.8 oz (73.8 kg)  01/09/17 165 lb 3.2 oz (74.9 kg)    Physical Exam Constitutional:      General: She is not in acute distress.    Appearance: She is well-developed.  HENT:     Head:     Comments: Some petechiae noted soft palate    Mouth/Throat:     Mouth: Mucous membranes are moist.     Pharynx: Pharyngeal swelling, oropharyngeal exudate and posterior oropharyngeal erythema present.  Cardiovascular:     Rate and Rhythm: Normal rate and regular rhythm.     Heart sounds: Normal heart sounds. No murmur. No friction rub.  Pulmonary:     Effort: Pulmonary effort is normal. No respiratory distress.     Breath sounds: Normal breath sounds. No wheezing or rales.  Musculoskeletal:  Right lower leg: No edema.     Left lower leg: No edema.  Lymphadenopathy:     Cervical: Cervical adenopathy present.  Neurological:     Mental Status: She is alert and oriented to person, place, and time.  Psychiatric:        Behavior: Behavior normal.     Assessment/Plan 1. Sore throat - Rapid Strep A  2. Strep pharyngitis Positive strep in office. She has tolerated keflex well in past. Stressed completion of abx course. Let us know if not improving or with any worsening of sx.  - cephALEXin (KEFLEX) 500 MG capsule; Take 1 capsule (500 mg total) by mouth 2 (two) times daily for 10 days.  Dispense: 20 capsule; Refill: 0    Return if symptoms worsen or fail  to improve.     Theodis Shove, MD

## 2018-09-10 ENCOUNTER — Other Ambulatory Visit (INDEPENDENT_AMBULATORY_CARE_PROVIDER_SITE_OTHER): Payer: BLUE CROSS/BLUE SHIELD

## 2018-09-10 ENCOUNTER — Other Ambulatory Visit: Payer: Self-pay

## 2018-09-10 ENCOUNTER — Ambulatory Visit (INDEPENDENT_AMBULATORY_CARE_PROVIDER_SITE_OTHER): Payer: BLUE CROSS/BLUE SHIELD | Admitting: Internal Medicine

## 2018-09-10 ENCOUNTER — Encounter: Payer: Self-pay | Admitting: Internal Medicine

## 2018-09-10 DIAGNOSIS — R300 Dysuria: Secondary | ICD-10-CM | POA: Diagnosis not present

## 2018-09-10 DIAGNOSIS — R3989 Other symptoms and signs involving the genitourinary system: Secondary | ICD-10-CM | POA: Diagnosis not present

## 2018-09-10 LAB — POCT URINALYSIS DIPSTICK
Bilirubin, UA: NEGATIVE
Blood, UA: POSITIVE
Glucose, UA: NEGATIVE
Ketones, UA: NEGATIVE
Nitrite, UA: NEGATIVE
Protein, UA: POSITIVE — AB
Spec Grav, UA: 1.02 (ref 1.010–1.025)
Urobilinogen, UA: 0.2 E.U./dL
pH, UA: 6 (ref 5.0–8.0)

## 2018-09-10 MED ORDER — SULFAMETHOXAZOLE-TRIMETHOPRIM 800-160 MG PO TABS: 1.0000 | ORAL_TABLET | Freq: Two times a day (BID) | ORAL | 0 refills | Status: DC

## 2018-09-10 NOTE — Progress Notes (Signed)
Virtual Visit via Video Note  I connected with@ on 09/10/18 at  4:00 PM EDT by a video enabled telemedicine application and verified that I am speaking with the correct person using two identifiers. Location patient: home Location provider:work  Office had to  finish with phone cause of deteriorating connection Persons participating in the virtual visit: patient, provider  [place]th national recommendations  regarding COVID 19 pandemic   video visit is advised over in office visit for this patient.  Patient aware  of the limitations of evaluation and management by telemedicine and  availability of in person appointments. and agreed to proceed.   HPI: Cheryl Cohen presents for video visit with 2-3 week hx of off and on end urinary dysuria  And  Urgency  Frequency .  lmp May 21  Nl.  No abd pain fever  Vag dc  On psych meds that are helpful  And stable seeing psych no new meds   All to amox   ROS: See pertinent positives and negatives per HPI.  Past Medical History:  Diagnosis Date  . Anxiety   . Bipolar 1 disorder (HCC)    hospitalized  3 x onset rx in 9th grade   . Depression   . Gonorrhea   . Hx of varicella   . Syphilis   . Twin pregnancy, delivered vaginally, IUFD stillborns 09/09/2015   IUFD  22 + weeks  still borns     Past Surgical History:  Procedure Laterality Date  . DILATION AND EVACUATION N/A 09/09/2015   Procedure: DILATATION AND EVACUATION;  Surgeon: Lavina Hammanodd Meisinger, MD;  Location: [facility];  Service: Gynecology;  Laterality: N/A;  . TYMPANOSTOMY TUBE PLACEMENT     asage 2-3 yrs    Family History  Problem Relation Age of Onset  . Diabetes Mother   . Arthritis Mother   . Hypertension Father   . Alcohol abuse Father   . Alcoholism Father     Social History   Tobacco Use  . Smoking status: Former Smoker    Packs/day: 0.20    Types: Cigarettes  . Smokeless tobacco: Never Used  . Tobacco comment: 1 cigarette a day  Substance Use Topics  . Alcohol use:  No  . Drug use: No      Current Outpatient Medications:  .  AMBULATORY NON FORMULARY MEDICATION, MIX: 90 ml Cherry Mylanta, 90 ml Cherry Benadryl, 30 ml Cherry Hurricane liquid.  5-10 ml PO swish and spit or swallow, Q2 hours, prn sore throat., Disp: 210 mL, Rfl: 1 .  ferrous sulfate 325 (65 FE) MG EC tablet, Take 325 mg by mouth daily with breakfast., Disp: , Rfl:  .  lithium carbonate (ESKALITH) 450 MG CR tablet, Take 225 mg by mouth at bedtime., Disp: , Rfl:  .  OLANZapine (ZYPREXA) 15 MG tablet, Take 15 mg by mouth at bedtime. , Disp: , Rfl: 0 .  OLANZapine (ZYPREXA) 5 MG tablet, Take 5 mg by mouth daily., Disp: , Rfl: 0 .  omega-3 acid ethyl esters (LOVAZA) 1 g capsule, Take 1 g by mouth daily., Disp: , Rfl:  .  risperiDONE (RISPERDAL) 3 MG tablet, Take 1.5 mg by mouth at bedtime., Disp: , Rfl:  .  sulfamethoxazole-trimethoprim (BACTRIM DS) 800-160 MG tablet, Take 1 tablet by mouth 2 (two) times daily. For uti, Disp: 10 tablet, Rfl: 0  EXAM: BP Readings from Last 3 Encounters:  05/11/18 104/68  01/22/17 (!) 100/58  01/09/17 100/66    VITALS per patient if  applicable:  GENERAL: alert, oriented, appears well and in no acute distress  HEENT: atraumatic, conjunttiva clear, no obvious abnormalities on inspection of external nose and ears  NECK: normal movements of the head and neck  LUNGS: on inspection no signs of respiratory distress, breathing rate appears normal, no obvious gross SOB, gasping or wheezing  CV: no obvious cyanosis  MS: moves all visible extremities without noticeable abnormality  PSYCH/NEURO: pleasant and cooperative, no obvious depression or anxiety, speech and thought processing grossly intact Lab Results  Component Value Date   WBC 6.8 10/20/2016   HGB 12.1 10/20/2016   HCT 35.0 (L) 10/20/2016   PLT 257 10/20/2016   GLUCOSE 87 10/20/2016   CHOL 127 02/22/2016   TRIG 39 02/22/2016   HDL 57 02/22/2016   LDLCALC 62 02/22/2016   ALT 18 10/20/2016    AST 22 10/20/2016   NA 138 10/20/2016   K 3.6 10/20/2016   CL 101 10/20/2016   CREATININE 0.69 10/20/2016   BUN 12 10/20/2016   CO2 29 10/20/2016   TSH 1.65 09/04/2016   HGBA1C 5.5 02/22/2016  ua pos  Leuk 3+   Sent for u cx   ASSESSMENT AND PLAN:  Discussed the following assessment and plan:  Suspected UTI  Dysuria  empiric antibiotic   Fu prn no help and cx pending  Counseled.   Expectant management and discussion of plan and treatment with opportunity to ask questions and all were answered. The patient agreed with the plan and demonstrated an understanding of the instructions.   Advised to call back or seek an in-person evaluation if worsening  or having  further concerns .   Shanon Ace, MD

## 2018-09-12 LAB — URINE CULTURE
MICRO NUMBER:: 551393
SPECIMEN QUALITY:: ADEQUATE

## 2018-09-13 NOTE — Progress Notes (Signed)
Tell patient that urine culture shows bacteria sensitive to medication given . Should resolve with current treatment .FU if not better. °

## 2020-01-09 ENCOUNTER — Telehealth: Payer: Self-pay | Admitting: Internal Medicine

## 2020-01-09 NOTE — Telephone Encounter (Signed)
error 

## 2020-04-03 NOTE — L&D Delivery Note (Signed)
Delivery Note Cheryl Cohen is a 30 y.o. P5T6144 at [redacted]w[redacted]d admitted for IOL for NRNST and decreased fetal movement.   GBS Status: Negative/-- (07/19 0422) Maximum Maternal Temperature: 98.3  Labor course: Initial SVE: 1/50/-2. Augmentation with: AROM, Pitocin, Cytotec, and IP Foley. She then progressed to complete.  ROM: 9h 37m with clear fluid  Birth: At 2214 a viable female was delivered via spontaneous vaginal delivery (Presentation: cephalic;LOA). Nuchal cord present: No.  Shoulders and body delivered in usual fashion. Infant placed directly on mom's abdomen for bonding/skin-to-skin, baby dried and stimulated. Cord clamped x 2 after 1 minute and cut by patient's mother. Cord blood collected. The placenta separated spontaneously and delivered via gentle cord traction.  Pitocin infused rapidly IV per protocol.  Fundus firm with massage.  Placenta inspected and appears to be intact with a 3 VC.  Placenta/Cord with the following complications: None. Cord pH: n/a Sponge and instrument count were correct x2.  Intrapartum complications: None Anesthesia:  epidural Episiotomy: none Lacerations: right labial Suture Repair: 3.0 vicryl EBL (mL):   Infant: APGAR (1 MIN): 8   APGAR (5 MINS): 9   APGAR (10 MINS):    Infant weight: pending  Mom to postpartum.  Baby to Couplet care / Skin to Skin. Placenta to L&D   Plans to Breastfeed Contraception: IUD Circumcision: wants inpatient  Note sent to [facility]: [facility] for pp visit.    Brand Males, MSN, CNM 11/03/2020 12:59 AM

## 2020-04-14 LAB — OB RESULTS CONSOLE HEPATITIS B SURFACE ANTIGEN: Hepatitis B Surface Ag: NEGATIVE

## 2020-04-14 LAB — OB RESULTS CONSOLE GC/CHLAMYDIA: Chlamydia: NEGATIVE

## 2020-04-14 LAB — OB RESULTS CONSOLE HGB/HCT, BLOOD
HCT: 37 (ref 29–41)
Hemoglobin: 12.6

## 2020-04-14 LAB — OB RESULTS CONSOLE PLATELET COUNT: Platelets: 214

## 2020-04-14 LAB — OB RESULTS CONSOLE RUBELLA ANTIBODY, IGM: Rubella: IMMUNE

## 2020-06-25 ENCOUNTER — Other Ambulatory Visit: Payer: Self-pay

## 2020-06-25 ENCOUNTER — Encounter: Payer: Self-pay | Admitting: Obstetrics & Gynecology

## 2020-06-25 ENCOUNTER — Ambulatory Visit (INDEPENDENT_AMBULATORY_CARE_PROVIDER_SITE_OTHER): Payer: Medicaid Other | Admitting: Obstetrics & Gynecology

## 2020-06-25 VITALS — BP 106/71 | HR 94 | Wt 178.0 lb

## 2020-06-25 DIAGNOSIS — Z3482 Encounter for supervision of other normal pregnancy, second trimester: Secondary | ICD-10-CM

## 2020-06-25 DIAGNOSIS — Z3A20 20 weeks gestation of pregnancy: Secondary | ICD-10-CM | POA: Diagnosis not present

## 2020-06-25 DIAGNOSIS — O0990 Supervision of high risk pregnancy, unspecified, unspecified trimester: Secondary | ICD-10-CM | POA: Insufficient documentation

## 2020-06-25 DIAGNOSIS — O364XX2 Maternal care for intrauterine death, fetus 2: Secondary | ICD-10-CM

## 2020-06-25 DIAGNOSIS — Z3490 Encounter for supervision of normal pregnancy, unspecified, unspecified trimester: Secondary | ICD-10-CM

## 2020-06-25 DIAGNOSIS — F29 Unspecified psychosis not due to a substance or known physiological condition: Secondary | ICD-10-CM

## 2020-06-25 DIAGNOSIS — F3163 Bipolar disorder, current episode mixed, severe, without psychotic features: Secondary | ICD-10-CM

## 2020-06-25 NOTE — Progress Notes (Signed)
°  Subjective:transfer    Cheryl Cohen is a G3P0110 [redacted]w[redacted]d being seen today for her first obstetrical visit.  Her obstetrical history is significant for bipolar, h/o twin demise 72 weeks 2017. Patient does intend to breast feed. Pregnancy history fully reviewed.  Patient reports no complaints.  Vitals:   06/25/20 0848  BP: 106/71  Pulse: 94  Weight: 178 lb (80.7 kg)    HISTORY: OB History  Gravida Para Term Preterm AB Living  3 1   1 1  0  SAB IAB Ectopic Multiple Live Births    1   1 0    # Outcome Date GA Lbr Len/2nd Weight Sex Delivery Anes PTL Lv  3 Current           2 IAB 06/25/17 [redacted]w[redacted]d         1A Preterm 09/09/15 [redacted]w[redacted]d 00:50 / 00:25 10.4 oz (0.295 kg) F Vag-Spont None  FD  1B Preterm 09/09/15 [redacted]w[redacted]d 00:50 / 00:43 13.9 oz (0.394 kg) F Vag-Spont None  FD   Past Medical History:  Diagnosis Date   Anxiety    Bipolar 1 disorder (HCC)    hospitalized  3 x onset rx in 9th grade    Depression    Gonorrhea    Hx of varicella    Syphilis    Twin pregnancy, delivered vaginally, IUFD stillborns 09/09/2015   IUFD  22 + weeks  still borns    Past Surgical History:  Procedure Laterality Date   DILATION AND EVACUATION N/A 09/09/2015   Procedure: DILATATION AND EVACUATION;  Surgeon: 11/09/2015, MD;  Location: [facility];  Service: Gynecology;  Laterality: N/A;   TYMPANOSTOMY TUBE PLACEMENT     asage 2-3 yrs   Family History  Problem Relation Age of Onset   Diabetes Mother    Arthritis Mother    Hypertension Father    Alcohol abuse Father    Alcoholism Father      Exam    Uterus:     Pelvic Exam:    Perineum:    Vulva:    Vagina:     pH:    Cervix:    Adnexa:    Bony Pelvis:   System: Breast:     Skin: normal coloration and turgor, no rashes    Neurologic: oriented, normal mood   Extremities: normal strength, tone, and muscle mass   HEENT PERRLA and sclera clear, anicteric   Mouth/Teeth dental hygiene good   Neck supple   Cardiovascular:  regular rate and rhythm   Respiratory:  appears well, vitals normal, no respiratory distress, acyanotic, normal RR   Abdomen: gravid   Urinary:       Assessment:    Pregnancy: G3P0110 Patient Active Problem List   Diagnosis Date Noted   Supervision of normal pregnancy, antepartum 06/25/2020   Psychotic disorder with delusions (HCC)    Bipolar affective disorder, mixed, severe (HCC) 10/04/2016   Fetal demise, greater than 22 weeks, antepartum 09/08/2015   Suicidal ideation 10/06/2013   Family history of diabetes mellitus type II 01/11/2012        Plan:     Initial labs drawn.sent from [facility] Prenatal vitamins. Problem list reviewed and updated. Genetic Screening discussed Quad screen  Ultrasound discussed; fetal survey: ordered.  Follow up in 4 weeks. 50% of 30 min visit spent on counseling and coordination of care.  MFM consult re lithium exposure   03/12/2012 06/25/2020

## 2020-06-25 NOTE — Progress Notes (Signed)
Per Notes NOB Transfer  Pt is 20 wk1d Last pap:02/25/2018. PHQ-9 = 1 2017 Twin IUFD 25wks  *Had U/S done at [facility].  LMP: 02/05/2020  Pt was hoping to get U/S today.

## 2020-06-25 NOTE — Patient Instructions (Signed)

## 2020-06-27 LAB — AFP, SERUM, OPEN SPINA BIFIDA
AFP MoM: 1.34
AFP Value: 75.7 ng/mL
Gest. Age on Collection Date: 20 weeks
Maternal Age At EDD: 28.6 yr
OSBR Risk 1 IN: 8546
Test Results:: NEGATIVE
Weight: 178 [lb_av]

## 2020-06-29 ENCOUNTER — Other Ambulatory Visit: Payer: Self-pay

## 2020-06-29 NOTE — Progress Notes (Signed)
Chief Complaint  Patient presents with  . Annual Exam    Has form for work     HPI: Patient  Cheryl Cohen  30 y.o. 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   Is pregnant about  20 w  edc 8 11  Is in prenatal care  Has psychia  Doing well  Loves working with children and has experience  No tb exposures or sx.  No back other physical limitation   HHof 1 no pets  Sleep:  7 hours neg tad  ocass soda  See psych.    Health Maintenance  Topic Date Due  . Hepatitis C Screening  Never done  . TETANUS/TDAP  Never done  . PAP-Cervical Cytology Screening  Never done  . PAP SMEAR-Modifier  09/01/2017  . COVID-19 Vaccine (3 - Booster for Pfizer series) 03/30/2020  . INFLUENZA VACCINE  11/01/2020  . HIV Screening  Completed  . HPV VACCINES  Aged Out   Health Maintenance Review LIFESTYLE:  Exercise:  Active  Tobacco/ETS: neg tad  hh of 1  Taking pre natals    ROS:  GEN/ HEENT: No fever, significant weight changes sweats headaches vision problems hearing changes, CV/ PULM; No chest pain shortness of breath cough, syncope,edema  change in exercise tolerance. GI /GU: No adominal pain, vomiting, change in bowel habits. No blood in the stool. No significant GU symptoms. SKIN/HEME: ,no acute skin rashes suspicious lesions or bleeding. No lymphadenopathy, nodules, masses.  NEURO/ PSYCH:  No neurologic signs such as weakness numbness. No depression anxiety. IMM/ Allergy: No unusual infections.  Allergy .   REST of 12 system review negative except as per HPI   Past Medical History:  Diagnosis Date  . Anxiety   . Bipolar 1 disorder (HCC)    hospitalized  3 x onset rx in 9th grade   . Depression   . Gonorrhea   . Hx of varicella   . Syphilis   . Twin pregnancy, delivered vaginally, IUFD stillborns 09/09/2015   IUFD  22 + weeks  still borns     Past Surgical History:  Procedure Laterality Date   . DILATION AND EVACUATION N/A 09/09/2015   Procedure: DILATATION AND EVACUATION;  Surgeon: [name], MD;  Location: [facility];  Service: Gynecology;  Laterality: N/A;  . TYMPANOSTOMY TUBE PLACEMENT     asage 2-3 yrs    Family History  Problem Relation Age of Onset  . Diabetes Mother   . Arthritis Mother   . Hypertension Father   . Alcohol abuse Father   . Alcoholism Father     Social History   Socioeconomic History  . Marital status: Single    Spouse name: Not on file  . Number of children: Not on file  . Years of education: Not on file  . Highest education level: Not on file  Occupational History  . Not on file  Tobacco Use  . Smoking status: Former Smoker    Packs/day: 0.20    Types: Cigarettes  . Smokeless tobacco: Never Used  . Tobacco comment: 1 cigarette a day  Vaping Use  . Vaping Use: Never used  Substance and Sexual Activity  . Alcohol use: No  . Drug use: No  . Sexual activity: Never  Other Topics Concern  . 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  In past    Worked also [employer]   Pet dog   Neg tad some caffiene       Now living at home  Had been with vine   Social Determinants of Health   Financial Resource Strain: Not on file  Food Insecurity: Not on file  Transportation Needs: Not on file  Physical Activity: Not on file  Stress: Not on file  Social Connections: Not on file       EXAM:  BP 102/60 (BP Location: Left Arm, Patient Position: Sitting, Cuff Size: Normal)   Pulse 84   Temp 97.6 F (36.4 C) (Oral)   Ht 5\' 7"  (1.702 m)   Wt 185 lb 12.8 oz (84.3 kg)   LMP 02/05/2020   SpO2 96%   BMI 29.10 kg/m   Body mass index is 29.1 kg/m. Wt Readings from Last 3 Encounters:  06/30/20 185 lb 12.8 oz (84.3 kg)  06/25/20 178 lb (80.7 kg)  05/11/18 168 lb (76.2 kg)    Physical Exam: Vital signs reviewed 07/10/18 is a well-developed well-nourished alert cooperative     who appearsr stated age in no acute distress.  HEENT: normocephalic atraumatic , Eyes: PERRL EOM's full, conjunctiva clear, Nares: paten,t no deformity discharge or tenderness., Ears: no deformity EAC's clear TMs with normal landmarks. Mouth: masked NECK: supple without masses, thyromegaly or bruits. CHEST/PULM:  Clear to auscultation and percussion breath sounds equal no wheeze , rales or rhonchi. No chest wall deformities or tenderness. Breast: normal by inspection .no obv abnormal  No lumps . CV: PMI is nondisplaced, S1 S2 no gallops, murmurs, rubs. Peripheral pulses are full without delay.No JVD .  ABDOMEN: Bowel sounds normal nontender  No guard or rebound, no hepato splenomegal gravid    Uterus above umbilicus  No bruits Extremtities:  No clubbing cyanosis or edema, no acute joint swelling or redness no focal atrophy NEURO:  Oriented x3, cranial nerves 3-12 appear to be intact, no obvious focal weakness,gait within normal limits no abnormal reflexes or asymmetrical SKIN: No acute rashes normal turgor, color, no bruising or petechiae. PSYCH: Oriented, good eye contact, no obvious depression anxiety, cognition and judgment appear normal. LN: no cervical axillary  adenopathy  Lab Results  Component Value Date   WBC 6.8 10/20/2016   HGB 12.6 04/14/2020   HCT 37 04/14/2020   PLT 214 04/14/2020   GLUCOSE 87 10/20/2016   CHOL 127 02/22/2016   TRIG 39 02/22/2016   HDL 57 02/22/2016   LDLCALC 62 02/22/2016   ALT 18 10/20/2016   AST 22 10/20/2016   NA 138 10/20/2016   K 3.6 10/20/2016   CL 101 10/20/2016   CREATININE 0.69 10/20/2016   BUN 12 10/20/2016   CO2 29 10/20/2016   TSH 1.65 09/04/2016   HGBA1C 5.5 02/22/2016    BP Readings from Last 3 Encounters:  06/30/20 102/60  06/25/20 106/71  05/11/18 104/68     ASSESSMENT AND PLAN:  Discussed the following assessment and plan:    ICD-10-CM   1. Visit for preventive health examination  Z00.00 PPD  2. [redacted] weeks gestation of  pregnancy  Z3A.21   form completed given to patient    PPD  placed and needs to be read in 48 hours   . Continue pre natal care  And support systems  Return for as indicated  yearly or other .  Patient Care Team: 07/10/18  K, MD as PCP - General (Internal Medicine) Tracey Harries, MD (Obstetrics and Gynecology) Phillip Heal, MD as Referring Physician (Psychiatry) Patient Instructions  Form signed  Good luck with pregnancy.   Return Friday afternoon to get  TB test read  And  Documented .    Neta Mends. Minda Faas M.D.

## 2020-06-30 ENCOUNTER — Ambulatory Visit (INDEPENDENT_AMBULATORY_CARE_PROVIDER_SITE_OTHER): Payer: 59 | Admitting: Internal Medicine

## 2020-06-30 ENCOUNTER — Encounter: Payer: Self-pay | Admitting: Internal Medicine

## 2020-06-30 VITALS — BP 102/60 | HR 84 | Temp 97.6°F | Ht 67.0 in | Wt 185.8 lb

## 2020-06-30 DIAGNOSIS — Z0000 Encounter for general adult medical examination without abnormal findings: Secondary | ICD-10-CM | POA: Diagnosis not present

## 2020-06-30 DIAGNOSIS — Z3A21 21 weeks gestation of pregnancy: Secondary | ICD-10-CM

## 2020-06-30 NOTE — Patient Instructions (Signed)
Form signed  Good luck with pregnancy.   Return Friday afternoon to get  TB test read  And  Documented .

## 2020-07-02 LAB — TB SKIN TEST
Induration: 0 mm
TB Skin Test: NEGATIVE

## 2020-07-05 ENCOUNTER — Telehealth: Payer: Self-pay | Admitting: *Deleted

## 2020-07-05 NOTE — Telephone Encounter (Signed)
Per chart is [facility] , will route message to that office. Cordelle Dahmen,RN

## 2020-07-05 NOTE — Telephone Encounter (Signed)
-----   Message from Adam Phenix, MD sent at 07/02/2020  1:57 PM EDT ----- Negative AFP screen

## 2020-07-21 ENCOUNTER — Encounter: Payer: Self-pay | Admitting: *Deleted

## 2020-07-21 ENCOUNTER — Other Ambulatory Visit: Payer: Self-pay | Admitting: *Deleted

## 2020-07-21 ENCOUNTER — Other Ambulatory Visit: Payer: Self-pay

## 2020-07-21 ENCOUNTER — Ambulatory Visit: Payer: 59 | Attending: Obstetrics & Gynecology

## 2020-07-21 ENCOUNTER — Other Ambulatory Visit: Payer: Self-pay | Admitting: Obstetrics & Gynecology

## 2020-07-21 ENCOUNTER — Ambulatory Visit (HOSPITAL_BASED_OUTPATIENT_CLINIC_OR_DEPARTMENT_OTHER): Payer: 59 | Admitting: *Deleted

## 2020-07-21 VITALS — BP 106/62 | HR 80

## 2020-07-21 DIAGNOSIS — O359XX0 Maternal care for (suspected) fetal abnormality and damage, unspecified, not applicable or unspecified: Secondary | ICD-10-CM

## 2020-07-21 DIAGNOSIS — O09891 Supervision of other high risk pregnancies, first trimester: Secondary | ICD-10-CM

## 2020-07-21 DIAGNOSIS — O09212 Supervision of pregnancy with history of pre-term labor, second trimester: Secondary | ICD-10-CM | POA: Diagnosis not present

## 2020-07-21 DIAGNOSIS — Z3A23 23 weeks gestation of pregnancy: Secondary | ICD-10-CM | POA: Diagnosis not present

## 2020-07-21 DIAGNOSIS — O355XX0 Maternal care for (suspected) damage to fetus by drugs, not applicable or unspecified: Secondary | ICD-10-CM | POA: Insufficient documentation

## 2020-07-21 DIAGNOSIS — O99342 Other mental disorders complicating pregnancy, second trimester: Secondary | ICD-10-CM | POA: Diagnosis not present

## 2020-07-21 DIAGNOSIS — Z363 Encounter for antenatal screening for malformations: Secondary | ICD-10-CM | POA: Diagnosis not present

## 2020-07-21 DIAGNOSIS — Z79899 Other long term (current) drug therapy: Secondary | ICD-10-CM

## 2020-07-21 DIAGNOSIS — Z362 Encounter for other antenatal screening follow-up: Secondary | ICD-10-CM

## 2020-07-21 DIAGNOSIS — Z3490 Encounter for supervision of normal pregnancy, unspecified, unspecified trimester: Secondary | ICD-10-CM

## 2020-07-21 DIAGNOSIS — F319 Bipolar disorder, unspecified: Secondary | ICD-10-CM

## 2020-07-21 NOTE — Progress Notes (Signed)
MFM Note  Cheryl Cohen was seen for a detailed fetal anatomy scan and consultation due to maternal bipolar disorder that is currently treated with Zyprexa.  The patient had been taking lithium early in her pregnancy.  The lithium was discontinued sometime during the first trimester.  She denies any other significant past medical history and denies any problems in her current pregnancy.    She has declined all screening tests for fetal aneuploidy in her current pregnancy.  She was informed that the fetal growth and amniotic fluid level were appropriate for her gestational age.   There were no obvious fetal anomalies noted on today's ultrasound exam.  However, today's exam was limited due to the fetal position.  The patient was informed that anomalies may be missed due to technical limitations. If the fetus is in a suboptimal position or maternal habitus is increased, visualization of the fetus in the maternal uterus may be impaired.  The patient's mother reports that her bipolar disorder is not well controlled with just Zyprexa.  She feels that her bipolar disorder is better controlled using a combination of lithium and Zyprexa.  The patient and her mother were advised that treatment with lithium during pregnancy is possible and has been found to be effective in the prevention of relapse of bipolar disorder.  As there were no obvious fetal anomalies noted on today's exam, they were reassured that her baby has most likely not been affected by lithium exposure in the first trimester.  The patient's mother was advised to speak to her psychiatrist regarding the resumption of lithium during her pregnancy so that her bipolar disease will be better controlled.  Lithium may be used in pregnancy if the benefits outweigh the risks.  Should she be resumed on lithium during her pregnancy, weekly monitoring of lithium blood levels in the third trimester is recommended.  Due to her use of lithium during the first  trimester, she was referred for a fetal echocardiogram with [facility].    A follow-up exam was scheduled in 4 weeks to assess the fetal growth and to complete the views of the fetal anatomy.  A total of 30 minutes was spent counseling and coordinating the care for this patient.  Greater than 50% of the time was spent in direct face-to-face contact.

## 2020-07-23 ENCOUNTER — Other Ambulatory Visit: Payer: Self-pay

## 2020-07-23 ENCOUNTER — Telehealth: Payer: Self-pay

## 2020-07-23 ENCOUNTER — Ambulatory Visit (INDEPENDENT_AMBULATORY_CARE_PROVIDER_SITE_OTHER): Payer: 59

## 2020-07-23 VITALS — BP 126/75 | HR 91 | Wt 188.2 lb

## 2020-07-23 DIAGNOSIS — F3163 Bipolar disorder, current episode mixed, severe, without psychotic features: Secondary | ICD-10-CM

## 2020-07-23 DIAGNOSIS — Z3490 Encounter for supervision of normal pregnancy, unspecified, unspecified trimester: Secondary | ICD-10-CM

## 2020-07-23 DIAGNOSIS — Z3A24 24 weeks gestation of pregnancy: Secondary | ICD-10-CM

## 2020-07-23 NOTE — Progress Notes (Signed)
LOW-RISK PREGNANCY OFFICE VISIT  Patient name: Cheryl Cohen MRN [id number]  Date of birth: [birth year] Chief Complaint:   Routine Prenatal Visit  Subjective:   Cheryl Cohen is a 30 y.o. G7P0110 female at [redacted]w[redacted]d with an Estimated Date of Delivery: 11/11/20 being seen today for ongoing management of a low-risk pregnancy aeb has Family history of diabetes mellitus type II; Suicidal ideation; Fetal demise, greater than 22 weeks, antepartum; Bipolar affective disorder, mixed, severe (HCC); Psychotic disorder with delusions (HCC); and Supervision of normal pregnancy, antepartum on their problem list.  Patient presents today with concerns regarding restarting Lithium.  Patient states that when she was at her anatomy scan her mother expressed concern about her mood and MFM recommended she restart her lithium.  Patient states that she feels stable overall and does not feel she needs to restart lithium right now.  Patient expresses feelings that her mother is being overprotective and not being supportive of patient's progress within BP disorder and management of her condition. Regarding pregnancy , patient endorses fetal movement. Patient denies abdominal cramping or contractions.  Patient denies vaginal concerns including abnormal discharge, leaking of fluid, and bleeding.  Contractions: Not present. Vag. Bleeding: None.  Movement: Present.  Reviewed past medical,surgical, social, obstetrical and family history as well as problem list, medications and allergies.  Objective   Vitals:   07/23/20 0912  BP: 126/75  Pulse: 91  Weight: 188 lb 3.2 oz (85.4 kg)  Body mass index is 29.48 kg/m.  Total Weight Gain:28 lb 3.2 oz (12.8 kg)         Physical Examination:   General appearance: Well appearing, and in no distress  Mental status: Alert, oriented to person, place, and time  Skin: Warm & dry  Cardiovascular: Normal heart rate noted  Respiratory: Normal respiratory effort, no  distress  Abdomen: Soft, gravid, nontender, AGA with Fundal Height: 24 cm  Pelvic: Cervical exam deferred           Extremities: Edema: None  Fetal Status: Fetal Heart Rate (bpm): 148  Movement: Present   No results found for this or any previous visit (from the past 24 hour(s)).  Assessment & Plan:  Low-risk pregnancy of a 30 y.o., G3P0110 at [redacted]w[redacted]d with an Estimated Date of Delivery: 11/11/20   1. Encounter for supervision of normal pregnancy, antepartum, unspecified gravidity -Anticipatory guidance for upcoming appts. -Patient to next appt in 4 weeks for an in-person visit. -Reviewed Glucola appt preparation including fasting the night before and morning of.   -Discussed anticipated office time of 2.5-3 hours.  -Reviewed blood draw procedures and labs which also include check of iron level.  -Discussed how results of GTT are handled including diabetic education and BS testing for abnormal results and routine care for normal results.   2. [redacted] weeks gestation of pregnancy -Doing well overall. -Discussed weight gain of 28 lbs. -Encouraged mindful healthy eating and light walking.  3. Bipolar affective disorder, mixed, severe (HCC) -Zyprexa 15 mg daily -Feels like it is working overall. -Questions if she needs to restart her lithium. -Provider informs patient that she appears stable today. -Encouraged patient to monitor mood and be her own self advocate.  Encouraged to discuss psychiatric medications and management with her psychiatrist.  -Informed of availability of IBH resources through [facility] if needed.     Meds: No orders of the defined types were placed in this encounter.  Labs/procedures today:  Lab Orders  No laboratory test(s) ordered today  Reviewed: Preterm labor symptoms and general obstetric precautions including but not limited to vaginal bleeding, contractions, leaking of fluid and fetal movement were reviewed in detail with the patient.  All questions were  answered.  Follow-up: Return in about 4 weeks (around 08/20/2020) for LROB with GTT.  No orders of the defined types were placed in this encounter.  Cherre Robins MSN, CNM 07/23/2020

## 2020-07-23 NOTE — Telephone Encounter (Signed)
[facility] has tried to reach patient to schedule fetal echo.

## 2020-07-23 NOTE — Progress Notes (Signed)
Patient reports fetal movement, denies pain. 

## 2020-07-23 NOTE — Patient Instructions (Signed)
Oral Glucose Tolerance Test During Pregnancy Why am I having this test? The oral glucose tolerance test (OGTT) is done to check how your body processes blood sugar (glucose). This is one of several tests used to diagnose diabetes that develops during pregnancy (gestational diabetes mellitus). Gestational diabetes is a short-term form of diabetes that some women develop while they are pregnant. It usually occurs during the second trimester of pregnancy and goes away after delivery. Testing, or screening, for gestational diabetes usually occurs at weeks 24-28 of pregnancy. You may have the OGTT test after having a 1-hour glucose screening test if the results from that test indicate that you may have gestational diabetes. This test may also be needed if:  You have a history of gestational diabetes.  There is a history of giving birth to very large babies or of losing pregnancies (having stillbirths).  You have signs and symptoms of diabetes, such as: ? Changes in your eyesight. ? Tingling or numbness in your hands or feet. ? Changes in hunger, thirst, and urination, and these are not explained by your pregnancy. What is being tested? This test measures the amount of glucose in your blood at different times during a period of 3 hours. This shows how well your body can process glucose. What kind of sample is taken? Blood samples are required for this test. They are usually collected by inserting a needle into a blood vessel.   How do I prepare for this test?  For 3 days before your test, eat normally. Have plenty of carbohydrate-rich foods.  Follow instructions from your health care provider about: ? Eating or drinking restrictions on the day of the test. You may be asked not to eat or drink anything other than water (to fast) starting 8-10 hours before the test. ? Changing or stopping your regular medicines. Some medicines may interfere with this test. Tell a health care provider about:  All  medicines you are taking, including vitamins, herbs, eye drops, creams, and over-the-counter medicines.  Any blood disorders you have.  Any surgeries you have had.  Any medical conditions you have. What happens during the test? First, your blood glucose will be measured. This is referred to as your fasting blood glucose because you fasted before the test. Then, you will drink a glucose solution that contains a certain amount of glucose. Your blood glucose will be measured again 1, 2, and 3 hours after you drink the solution. This test takes about 3 hours to complete. You will need to stay at the testing location during this time. During the testing period:  Do not eat or drink anything other than the glucose solution.  Do not exercise.  Do not use any products that contain nicotine or tobacco, such as cigarettes, e-cigarettes, and chewing tobacco. These can affect your test results. If you need help quitting, ask your health care provider. The testing procedure may vary among health care providers and hospitals. How are the results reported? Your results will be reported as milligrams of glucose per deciliter of blood (mg/dL) or millimoles per liter (mmol/L). There is more than one source for screening and diagnosis reference values used to diagnose gestational diabetes. Your health care provider will compare your results to normal values that were established after testing a large group of people (reference values). Reference values may vary among labs and hospitals. For this test (Carpenter-Coustan), reference values are:  Fasting: 95 mg/dL (5.3 mmol/L).  1 hour: 180 mg/dL (10.0 mmol/L).  2 hour:   155 mg/dL (8.6 mmol/L).  3 hour: 140 mg/dL (7.8 mmol/L). What do the results mean? Results below the reference values are considered normal. If two or more of your blood glucose levels are at or above the reference values, you may be diagnosed with gestational diabetes. If only one level is  high, your health care provider may suggest repeat testing or other tests to confirm a diagnosis. Talk with your health care provider about what your results mean. Questions to ask your health care provider Ask your health care provider, or the department that is doing the test:  When will my results be ready?  How will I get my results?  What are my treatment options?  What other tests do I need?  What are my next steps? Summary  The oral glucose tolerance test (OGTT) is one of several tests used to diagnose diabetes that develops during pregnancy (gestational diabetes mellitus). Gestational diabetes is a short-term form of diabetes that some women develop while they are pregnant.  You may have the OGTT test after having a 1-hour glucose screening test if the results from that test show that you may have gestational diabetes. You may also have this test if you have any symptoms or risk factors for this type of diabetes.  Talk with your health care provider about what your results mean. This information is not intended to replace advice given to you by your health care provider. Make sure you discuss any questions you have with your health care provider. Document Revised: 08/28/2019 Document Reviewed: 08/28/2019 Elsevier Patient Education  2021 Elsevier Inc.  

## 2020-07-28 ENCOUNTER — Telehealth: Payer: Self-pay | Admitting: Internal Medicine

## 2020-07-28 NOTE — Telephone Encounter (Signed)
The patient called to get a copy of her Staff Health Assessment/Medical Report from 07/09/2020.  I advised the patient to that she will need to come by the office to fill out a medical records release form.  I have printed the copy and attached the release form to it for completion.

## 2020-08-18 ENCOUNTER — Other Ambulatory Visit: Payer: Self-pay | Admitting: Obstetrics and Gynecology

## 2020-08-18 ENCOUNTER — Other Ambulatory Visit: Payer: Self-pay

## 2020-08-18 ENCOUNTER — Ambulatory Visit: Payer: 59 | Attending: Obstetrics

## 2020-08-18 ENCOUNTER — Ambulatory Visit: Payer: 59 | Admitting: *Deleted

## 2020-08-18 ENCOUNTER — Encounter: Payer: Self-pay | Admitting: *Deleted

## 2020-08-18 VITALS — BP 133/70 | HR 88

## 2020-08-18 DIAGNOSIS — O09212 Supervision of pregnancy with history of pre-term labor, second trimester: Secondary | ICD-10-CM

## 2020-08-18 DIAGNOSIS — Z8751 Personal history of pre-term labor: Secondary | ICD-10-CM

## 2020-08-18 DIAGNOSIS — F319 Bipolar disorder, unspecified: Secondary | ICD-10-CM

## 2020-08-18 DIAGNOSIS — Z79899 Other long term (current) drug therapy: Secondary | ICD-10-CM | POA: Diagnosis not present

## 2020-08-18 DIAGNOSIS — Z3A27 27 weeks gestation of pregnancy: Secondary | ICD-10-CM

## 2020-08-18 DIAGNOSIS — Z362 Encounter for other antenatal screening follow-up: Secondary | ICD-10-CM | POA: Diagnosis present

## 2020-08-18 DIAGNOSIS — O99342 Other mental disorders complicating pregnancy, second trimester: Secondary | ICD-10-CM

## 2020-08-18 DIAGNOSIS — O09891 Supervision of other high risk pregnancies, first trimester: Secondary | ICD-10-CM

## 2020-08-18 DIAGNOSIS — O99322 Drug use complicating pregnancy, second trimester: Secondary | ICD-10-CM | POA: Diagnosis not present

## 2020-08-18 DIAGNOSIS — O09213 Supervision of pregnancy with history of pre-term labor, third trimester: Secondary | ICD-10-CM

## 2020-08-18 DIAGNOSIS — O99340 Other mental disorders complicating pregnancy, unspecified trimester: Secondary | ICD-10-CM

## 2020-08-20 ENCOUNTER — Other Ambulatory Visit: Payer: Self-pay

## 2020-08-20 ENCOUNTER — Ambulatory Visit (INDEPENDENT_AMBULATORY_CARE_PROVIDER_SITE_OTHER): Payer: 59 | Admitting: Nurse Practitioner

## 2020-08-20 ENCOUNTER — Other Ambulatory Visit: Payer: 59

## 2020-08-20 VITALS — BP 110/69 | HR 91 | Wt 186.0 lb

## 2020-08-20 DIAGNOSIS — Z3490 Encounter for supervision of normal pregnancy, unspecified, unspecified trimester: Secondary | ICD-10-CM

## 2020-08-20 DIAGNOSIS — O364XX2 Maternal care for intrauterine death, fetus 2: Secondary | ICD-10-CM

## 2020-08-20 DIAGNOSIS — Z23 Encounter for immunization: Secondary | ICD-10-CM | POA: Diagnosis not present

## 2020-08-20 DIAGNOSIS — O24410 Gestational diabetes mellitus in pregnancy, diet controlled: Secondary | ICD-10-CM

## 2020-08-20 DIAGNOSIS — F3163 Bipolar disorder, current episode mixed, severe, without psychotic features: Secondary | ICD-10-CM

## 2020-08-20 NOTE — Progress Notes (Signed)
Subjective:  Cheryl Cohen is a 30 y.o. G3P0110 at [redacted]w[redacted]d being seen today for ongoing prenatal care.  She is currently monitored for the following issues for this high-risk pregnancy and has Family history of diabetes mellitus type II; Suicidal ideation; Fetal demise, greater than 22 weeks, antepartum; Bipolar affective disorder, mixed, severe (HCC); Psychotic disorder with delusions (HCC); and Supervision of normal pregnancy, antepartum on their problem list.  Patient reports restarted Lithium.  Contractions: Not present. Vag. Bleeding: None.  Movement: Present. Denies leaking of fluid.   The following portions of the patient's history were reviewed and updated as appropriate: allergies, current medications, past family history, past medical history, past social history, past surgical history and problem list. Problem list updated.  Objective:   Vitals:   08/20/20 0901  BP: 110/69  Pulse: 91  Weight: 186 lb (84.4 kg)    Fetal Status: Fetal Heart Rate (bpm): 140 Fundal Height: 27 cm Movement: Present     General:  Alert, oriented and cooperative. Patient is in no acute distress.  Skin: Skin is warm and dry. No rash noted.   Cardiovascular: Normal heart rate noted  Respiratory: Normal respiratory effort, no problems with respiration noted  Abdomen: Soft, gravid, appropriate for gestational age. Pain/Pressure: Absent     Pelvic:  Cervical exam deferred        Extremities: Normal range of motion.  Edema: None  Mental Status: Normal mood and affect. Normal behavior. Normal judgment and thought content.   Urinalysis:      Assessment and Plan:  Pregnancy: G3P0110 at [redacted]w[redacted]d  1. Encounter for supervision of normal pregnancy, antepartum, unspecified gravidity Reviewed signing up for childbirth and breastfeeding classes. Reviewed selecting a pediatrician - list in AVS today. Reviewed selecting contraception - client wanted to think about it more. Got TDAP today.  - Glucose  Tolerance, 2 Hours w/1 Hour - CBC - HIV Antibody (routine testing w rflx) - RPR - Ambulatory referral to Integrated Behavioral Health  2. Bipolar affective disorder, mixed, severe (HCC) Reports her mother has wanted her to be taking Lithium again as her mother says she has been "snappy".  States that Dr. Parke Poisson at MFM reports that Lithium could be taking in pregnancy although there is not info in his note about this conversation.  States that she stopped her Zyprexa and restarted 1/2 of a pill of Lithium since her [place] appointment last week.  States her psychiatrist wanted to know if her OB wants her to be on Lithium.   Consult with Dr. Donavan Foil and patient informed of the plan to stop the lithium and begin Zyprexa again Client has pregnancy care manager - Volney American - asked her to call Ms. Ether Griffins and arrange regular counseling. Also will set up behavioral health appointment here to meet Sue Lush in the office. Client had lots of questions about working 2 jobs - has just gotten a job on the weekend to help pay bills and she works full time (M-F) in a day care.   Discussed with her to watch her mood and managing her current needs - unsure if working more hours will help her be less snappy.   With this approach of pregnancy care manager and in office support with [facility], perhaps there will be time to discuss her needs and how to manage her symptoms without her Lithium.  3. Fetal demise, greater than 22 weeks, antepartum, fetus 2 of multiple gestation Previous history with twin gestation at 25 weeks IUFD x 2  in 2017 - was on Zoloft at that time.   Preterm labor symptoms and general obstetric precautions including but not limited to vaginal bleeding, contractions, leaking of fluid and fetal movement were reviewed in detail with the patient. Please refer to After Visit Summary for other counseling recommendations.  Return in about 2 weeks (around 09/03/2020) for in person ROB.  Nolene Bernheim, RN, MSN,  NP-BC Nurse Practitioner, [facility] for [employer], [facility] 08/20/2020 11:46 AM

## 2020-08-20 NOTE — Progress Notes (Signed)
Pt states she needs to discuss restarting her Lithium Rx.

## 2020-08-20 NOTE — Patient Instructions (Signed)
Check [email or web address] for childbirth and breastfeeding classes  AREA PEDIATRIC/FAMILY PRACTICE PHYSICIANS  Central/[place]outheast [place] ([id number]) . [facility] Melodie Bouillon, MD; Lum Babe, MD; Sheffield Slider, MD; Leveda Anna, MD; McDiarmid, MD; Jerene Bears, MD; Jennette Kettle, MD; Gwendolyn Grant, MD o [address] [place] [place], [place] [address] o [phone] o Mon-Fri 8:30-12:30, 1:30-5:00 o Providers come to see babies at [facility] o Accepting Medicaid . [facility] o Limited providers who accept newborns: Docia Chuck, MD; Kateri Plummer, MD; Paulino Rily, MD o [address], [place], [place] [address] o [phone] o Mon-Fri 8:00-5:30 o Babies seen by providers at [facility] o Does NOT accept Medicaid o Please call early in hospitalization for appointment (limited availability)  . [facility] Katy Fatima Sanger, MD o [address] [place], [place] [address] o [phone] o Mon, Tue, Thur, Fri 8:30-5:00, Wed 10:00-7:00 (closed 1-2pm) o Babies seen by [facility] providers o Accepting Medicaid . Donnie Coffin - Pediatrician Fae Pippin, MD o [address], [place], [place] [address] o [phone] o Mon-Fri 8:30-5:00, Sat 8:30-12:00 o Provider comes to see babies at [facility] o Accepting Medicaid o Must have been referred from current patients or contacted office prior to delivery . [facility]) Leotis Pain, MD; Ave Filter, MD; Luna Fuse, MD; Kennedy Bucker, MD; Konrad Dolores, MD; Kathlene November, MD; Jenne Campus, MD; Lubertha South, MD; Wynetta Emery, MD; Duffy Rhody, MD; Gerre Couch, NP; Shirl Harris, NP o [address], [place], [place] [address] o [phone] o Mon, Tue, Thur, Fri 8:30-5:30, Wed 9:30-5:30, Sat 8:30-12:30 o Babies seen by [facility] providers o Accepting Medicaid o Only accepting infants of first-time parents or siblings of current patients [facility] discharge coordinator  will make follow-up appointment . Cyril Mourning o [address], [place], [place]  [address] o [phone]   Fax - [phone] . [facility] o [address], [place], [place]  [address] o Phone - [phone]   Fax - [phone] . Lucio Edward o [address], [place], [place]  [address] o [phone]  East/Northeast  [phone]) . [facility] Jorge Mandril, MD; Alita Chyle, MD; Princella Ion, MD; MD; Earlene Plater, MD; Jamesetta Orleans, MD; Alvera Novel, MD; Clarene Duke, MD; Rana Snare, MD; Carmon Ginsberg, MD; Alinda Money, MD; Hosie Poisson, MD; Mayford Knife, MD o [address] [place], [place] [address] o [phone] o Mon-Fri 8:30-5:00 (extended evenings Mon-Thur as needed), Sat-Sun 10:00-1:00 o Providers come to see babies at [facility] o Accepting Medicaid for families of first-time babies and families with all children in the household age 33 and under. Must register with office prior to making appointment (M-F only). [facility] Odella Aquas, NP; Lynelle Doctor, MD; Susann Givens, MD; [place], [place] o [address] [place], [place] [address] o [phone] o Mon-Fri 8:00-5:00 o Babies seen by providers at [facility] o Does NOT accept Medicaid/Commercial Insurance Only . [facility] [place] ([facility]  Suzette Battiest, MD; Zachery Dauer, MD; Stefan Church, MD; Sabino Dick, MD; Quitman Livings, MD; Farris Has, MD; Gaynell Face, MD; Betha Loa, MD; Colon Flattery, MD; Clifton James, MD o [address] [place] [place], [place] [address] o [phone] o Mon-Fri 8:30-5:30, Sat (Oct.-Mar.) 9:00-1:00 o Babies seen by providers at [facility] o Accepting [facility] [phone]) . [facility] Dimitri, MD; Sheliah Hatch, MD o [address], [place], [place] [address] o [phone] o Mon-Fri 8:30-5:00, Sat 8:30-12:00 o Providers come to see babies at [facility] o Does  NOT accept Medicaid . [facility] Cindy Hazy, [place]; [place], MD;  [place], [place]; Wynelle Link, MD; Azucena Cecil, MD o [address], [place], [place] [address] o [phone] o Mon-Fri 8:00-5:00 o Babies seen by providers at [facility] o Does NOT accept Medicaid o Only accepting babies of parents who are patients o Please call early in hospitalization for appointment (limited availability) . [facility] Pediatricians Lamar Benes, MD; Abran Cantor, MD; Early Osmond, MD; Cherre Huger, NP; Hyacinth Meeker, MD; Dwan Bolt, MD; Jarold Motto, NP; Dario Guardian, MD; Talmage Nap, MD; Maisie Fus, MD; Pricilla Holm, MD; Tama High, MD o [address] [place], [place], [place] [address] o [phone] o Mon-Fri 8:00-5:00, Sat 9:00-12:00 o Providers come to see babies at [facility] o Does NOT accept [facility] [phone]) . [facility] at [facility] o Limited providers accepting new patients: Drema Pry, NP; [place], [place] o [address] [place], [place] [address] o [phone] o Mon-Fri 8:00-5:00 o Babies seen by providers at [facility] o Does NOT accept Medicaid o Only accepting babies of parents who are patients o Please call early in hospitalization for appointment (limited availability) . [facility] Luan Pulling, MD; Nash Dimmer, MD o [address] [place], [place] [address] o [phone] (press 1 to schedule appointment) o Mon-Fri 8:00-5:00 o Providers come to see babies at [facility] o Does NOT accept Medicaid . [facility] Cristino Martes, MD o [address], [place], [place] [address] o [phone] o Mon-Fri 8:30-5:00 (lunch 12:30-1:00), extended hours by appointment only Wed 5:00-6:30 o Babies seen by [facility] providers o Accepting Medicaid . [facility] Abbot, MD; [place], MD; Hassan Rowan, MD o [address] [place], [place], [place] [address] o [phone] o Mon-Fri 8:00-5:00 o Babies seen by [facility] providers o Does NOT accept Medicaid . Nature conservation officer at [place] [address] Elsworth Soho, MD; Durene Cal, MD; [place],  DO o [address] [place], [place] [address] o [phone] o Mon-Fri 8:00-5:00 o Babies seen by [facility] providers o Does NOT accept Medicaid . [facility] o [place], [place]; [place], [place]; [place], NP; Avis Epley, MD; Vonna Kotyk, MD; Clance Boll, MD; Stevphen [place], NP; Arvilla Market, NP; Ann Maki, NP; Otis Dials, NP; Vaughan Basta, MD; [place], MD o [address] [place], [place] [address] o [phone] o Mon-Fri 8:30-5:00, Sat 10:00-1:00 o Providers come to see babies at [facility] o Does NOT accept Medicaid o Free prenatal information session Tuesdays at 4:45pm . [facility] Luna Kitchens, MD; [place], [place]; [place], [place]; [place], [place] o [address] [place] [address] o [phone] o Mon-Fri 7:30-5:30 o Babies seen by [facility] providers . [facility] o [address], [place], [place]  [address] o [phone]   Fax - [phone]  [place] [phone] & [phone]) . [facility] Alphonsa Overall, MD o [address] [place], [place] [address] o [phone] o Mon-Thur 8:00-6:00 o Providers come to see babies at [facility] o Accepting Medicaid . [facility] Zenon Mayo, NP; Cyndia Bent, MD; [place], [place]; [place], [place] o [address] [place], [place] [address] o [phone] o Mon-Thur 7:30-7:30, Fri 7:30-4:30 o Babies seen by [facility] providers o Accepting Medicaid . [facility] Cheryle Horsfall, MD; Janene Harvey, NP; Vonita Moss, MD o [address], [place], [place] [address] o [phone] o Mon-Fri 8:30-5:00, Sat 8:30-12:00 o Providers come to see babies at [facility] o Accepting Medicaid o Must have "Meet & Greet" appointment at office prior to delivery . [facility]  [place] ([facility]) Llana Aliment, MD; Earlene Plater, MD; Lucretia Roers, MD o [address], [place], [place] [address] o [phone] o Mon-Wed 8:00-6:00, Thur-Fri  8:00-5:00, Sat 9:00-12:00 o Providers come to see babies at [facility] o Does NOT accept Medicaid o Only accepting siblings of current patients . [facility]  o [address], [place], [place]  [address] o [phone]   Fax - [phone] . [facility] at [facility] o [phone] [address], [place], [place]  [address] o [phone]   Fax - [phone]  [place] [phone] & [phone]) . Nature conservation officer at [facility] o [place], DO; [place], DO o [address] [place], [place] [address] o [phone] o Mon-Fri 7:00-5:00 o Babies seen by [facility] providers o Does NOT accept Medicaid . [facility] Ellis Savage, MD; [place], [place]; [place], [place] o [address], [place], [place] [address] o [phone] o Mon-Fri 8:00-5:00 o Babies seen by [facility] providers o Accepting Medicaid . [facility] - [address] Franne Forts, MD; [place], [place]; [place], NP; [place], [place] o [address] [place], [place], [place] [address] o [phone] o Mon-Fri 8:00-5:00 o Babies seen by providers at [facility] o Accepting [facility] [phone]) . [facility] [place], [place] o [address] [place], [place] [address] o [phone] o Mon-Fri 8:00-5:00 o Babies seen by [facility] providers o Does NOT accept Medicaid o Limited availability, please call early in hospitalization to schedule follow-up . [facility] Jolee Ewing, PA; Eddie Candle, MD; [place], MD; [place], [place]; Constance Goltz, MD; [place], [place] o [facility] [facility] [address], [place], [place] [address] o [phone] o Mon-Fri 8:30-5:00, Sat 9:00-12:00 o Babies seen by providers at [facility] o Accepting Medicaid o Please register online then schedule online or call office o [email or web address] . [facility]) Samuella Bruin, NP; Lucianne Muss, MD; Lanier Clam, PA o [address], [place], [place] [address] o [phone] o Mon-Fri 8:00-5:00 o Babies seen by providers at [facility] o Accepting Medicaid . [facility] [place] ([facility]) Sharin Mons, MD; Reed Breech, NP; Shelva Majestic, MD o [address], [place], [place] [address] o [phone] o Mon-Fri 8:00-5:30, Sat&Sun by appointment (phones open at 8:30) o Babies seen by [facility] providers o Accepting Medicaid o Must be a first-time baby or sibling of current patient . [facility] [address], [place], [place]  [address] o [phone]   Fax - [phone]  [place] [phone] & [phone]) . [facility] o [place], [place]; [place], [place]; Dimple Casey, MD; [place], [place]; Carolyne Fiscal, MD o [address] [place], [place] [address] o [phone] o Mon-Thur 8:00-7:00, Fri 8:00-5:00, Sat 8:00-12:00, Sun 9:00-12:00 o Babies seen by [facility] providers o Accepting Medicaid . [facility] at [facility], MD; Gaynell Face, MD; [facility], MD o [address], [place], [place] [address] o [phone] o Mon-Thur 8:00-5:00 o Babies seen by providers at [facility] o Accepting Medicaid . [facility] [place] Gwenlyn Saran, MD; [place], MD; Madilyn Fireman, MD; Melvyn Neth, MD; List, MD; Lazarus Salines, MD; Gaynell Face, MD; Berneda Rose, MD; Flora Lipps, MD; Beryl Meager, MD; Luther Redo, MD; Lavonia Drafts, MD; Kellie Simmering, MD o  [address], [place], [place] [address] o [phone] o Mon-Fri 8:00-5:30, Sat (Oct.-Mar.) 9:00-1:00 o Babies seen by providers at [facility] o Accepting Medicaid o Must fill out new patient packet, available online at [email or web address] . [place] ([facility]) Barnabas Lister,  NP; [place]on Kingfisher, NP; Claiborne Billings, NP; Rolla Plate, MD; [place], [place]; Carola Rhine, MD; Tyron Russell, MD; Delia Chimes, NP o [address], [place], [place] [address] o [phone] o Mon-Thur 8:00-5:30, Fri 8:00-5:00 o Babies seen by providers at [place] [phone]) . [place], [place]; [place], MD; Dennard Schaumann, MD; [place], [place] o [address] [place], [place] [address] o [phone] o Mon-Fri 8:00-5:00 o Babies seen by providers at [place] [phone]) . [facility] [place], [place]; Olen Pel, MD; [place], [place], [place], [place] [address] o [phone] o Mon-Fri 8:00-5:00 o Babies seen by providers at [facility] o Does NOT accept Medicaid o Limited appointment availability, please call early in hospitalization  . Therapist, music at [place], [place]; [place], [address], [place], [place] [address] o [phone] o Mon-Fri 8:00-5:00 o Babies seen by [facility] providers o Does NOT accept Medicaid . [facility] Su [place], MD; Guy Sandifer, MD; [place], [place]; [place], [place] [address], [place], [place] [address] o [phone] o Mon-Fri 8:00-5:00 o After hours clinic [facility] [place], [place] [address]) [phone] Mon-Fri 5:00-8:00, Sat 12:00-6:00, Sun 10:00-4:00 o Babies seen by [facility] providers o Accepting Medicaid . [place] at [facility] o [address], [place], [place]  [address] o [phone]   Fax - [phone]  [place] [phone]) . Therapist, music at [facility], MD o [address] [place], [place], [place] [address] o [phone] o Mon-Fri 8:00-5:00 o Babies seen by [facility] providers o Does NOT accept Medicaid . [place] ([place] [facility] [place]) Bing Neighbors, MD o [address]  [place], [place], [place] [address] o [phone] o Mon-Thur 8:00-7:00, Fri 8:00-5:00, Sat 8:00-12:00 o Babies seen by providers at [facility] o Accepting Medicaid - but does not have vaccinations in office (must be received elsewhere) o Limited availability, please call early in hospitalization  [place] ([phone]) . [place], [place] [address] [place] [address] o [phone]  Fax [phone]

## 2020-08-21 LAB — CBC
Hematocrit: 38.4 % (ref 34.0–46.6)
Hemoglobin: 13.6 g/dL (ref 11.1–15.9)
MCH: 32.3 pg (ref 26.6–33.0)
MCHC: 35.4 g/dL (ref 31.5–35.7)
MCV: 91 fL (ref 79–97)
Platelets: 235 10*3/uL (ref 150–450)
RBC: 4.21 x10E6/uL (ref 3.77–5.28)
RDW: 12 % (ref 11.7–15.4)
WBC: 13.2 10*3/uL — ABNORMAL HIGH (ref 3.4–10.8)

## 2020-08-21 LAB — GLUCOSE TOLERANCE, 2 HOURS W/ 1HR
Glucose, 1 hour: 134 mg/dL (ref 65–179)
Glucose, 2 hour: 103 mg/dL (ref 65–152)
Glucose, Fasting: 98 mg/dL — ABNORMAL HIGH (ref 65–91)

## 2020-08-21 LAB — HIV ANTIBODY (ROUTINE TESTING W REFLEX): HIV Screen 4th Generation wRfx: NONREACTIVE

## 2020-08-21 LAB — RPR: RPR Ser Ql: NONREACTIVE

## 2020-08-22 NOTE — Addendum Note (Signed)
Addended by: Currie Paris on: 08/22/2020 07:56 PM   Modules accepted: Orders

## 2020-08-23 ENCOUNTER — Other Ambulatory Visit: Payer: Self-pay

## 2020-08-23 DIAGNOSIS — O24419 Gestational diabetes mellitus in pregnancy, unspecified control: Secondary | ICD-10-CM

## 2020-08-23 MED ORDER — ACCU-CHEK SOFTCLIX LANCETS MISC: 1.0000 | Freq: Four times a day (QID) | 12 refills | Status: DC

## 2020-08-23 MED ORDER — ACCU-CHEK GUIDE VI STRP: ORAL_STRIP | 12 refills | Status: DC

## 2020-08-23 MED ORDER — ACCU-CHEK GUIDE W/DEVICE KIT: 1.0000 | PACK | Freq: Four times a day (QID) | 0 refills | Status: DC

## 2020-08-23 NOTE — Progress Notes (Signed)
GDM supplies ordered referral was placed already by provider TC to pt to make aware.

## 2020-08-25 ENCOUNTER — Encounter: Payer: Self-pay | Admitting: Registered"

## 2020-08-25 ENCOUNTER — Other Ambulatory Visit: Payer: Self-pay

## 2020-08-25 ENCOUNTER — Encounter: Payer: 59 | Attending: Nurse Practitioner | Admitting: Registered"

## 2020-08-25 DIAGNOSIS — O24419 Gestational diabetes mellitus in pregnancy, unspecified control: Secondary | ICD-10-CM | POA: Insufficient documentation

## 2020-08-25 NOTE — Progress Notes (Signed)
Patient was seen on 08/25/20 for Gestational Diabetes self-management class [facility]. The following learning objectives were met by the patient during this course:   States the definition of Gestational Diabetes  States why dietary management is important in controlling blood glucose  Describes the effects each nutrient has on blood glucose levels  Demonstrates ability to create a balanced meal plan  Demonstrates carbohydrate counting   States when to check blood glucose levels  Demonstrates proper blood glucose monitoring techniques  States the effect of stress and exercise on blood glucose levels  States the importance of limiting caffeine and abstaining from alcohol and smoking  Blood glucose monitor given: Patient has meter and is checking blood sugar prior to class  Patient instructed to monitor glucose levels: FBS: 60 - <95; 1 hour: <140; 2 hour: <120  Patient received handouts:  Nutrition Diabetes and Pregnancy, including carb counting list  Patient will be seen for follow-up as needed.

## 2020-08-31 ENCOUNTER — Ambulatory Visit (INDEPENDENT_AMBULATORY_CARE_PROVIDER_SITE_OTHER): Payer: 59 | Admitting: Licensed Clinical Social Worker

## 2020-08-31 DIAGNOSIS — Z8659 Personal history of other mental and behavioral disorders: Secondary | ICD-10-CM | POA: Diagnosis not present

## 2020-09-01 NOTE — BH Specialist Note (Signed)
Integrated Behavioral Health via Telemedicine Visit  09/01/2020 Cheryl Cohen [id number]  Number of Integrated Behavioral Health visits: 1/6 Session Start time: 2:03pm  Session End time: 2:40pm Total time: 37 mins via mychart video   Referring Provider: Lilyan Punt NP Patient/Family location: Home  [facility] Provider location: [facility]  All persons participating in visit: Cheryl Cohen and Cheryl Cohen Types of Service: Individual psychotherapy and Video visit  I connected with Cheryl Cohen and/or Cheryl Cohen's n/a via  Telephone or Video Enabled Telemedicine Application  (Video is Caregility application) and verified that I am speaking with the correct person using two identifiers. Discussed confidentiality: Yes   I discussed the limitations of telemedicine and the availability of in person appointments.  Discussed there is a possibility of technology failure and discussed alternative modes of communication if that failure occurs.  I discussed that engaging in this telemedicine visit, they consent to the provision of behavioral healthcare and the services will be billed under their insurance.  Patient and/or legal guardian expressed understanding and consented to Telemedicine visit: Yes   Presenting Concerns: Patient and/or family reports the following symptoms/concerns: History of bipolar disorder and depression  Duration of problem: Over one year ; Severity of problem: mild  Patient and/or Family's Strengths/Protective Factors: Concrete supports in place (healthy food, safe environments, etc.)  Goals Addressed: Patient will: 1.  Reduce symptoms of: depression  2.  Increase knowledge and/or ability of: coping skills, healthy habits and stress reduction  3.  Demonstrate ability to: Increase adequate support systems for patient/family  Progress towards Goals: Ongoing  Interventions: Interventions utilized:  Supportive Counseling Standardized Assessments  completed: PHQ 9   Assessment: Patient reports history of bipolar disorder and depression. Cheryl Cohen reports compliance with medication and regularly sees her therapist. 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   Patient may benefit from integrated behavioral health   Plan: 1. Follow up with behavioral health clinician on : 3 weeks via mychart  2. Behavioral recommendations: Prioritize rest, keep all scheduled appts, take prescribed medication as directed. Stressed importance of self care and prioritizing task to prevent burnout. 3. Referral(s): n/a  I discussed the assessment and treatment plan with the patient and/or parent/guardian. They were provided an opportunity to ask questions and all were answered. They agreed with the plan and demonstrated an understanding of the instructions.   They were advised to call back or seek an in-person evaluation if the symptoms worsen or if the condition fails to improve as anticipated.  Cheryl Saxon, LCSW

## 2020-09-03 ENCOUNTER — Encounter: Payer: Self-pay | Admitting: Family Medicine

## 2020-09-03 ENCOUNTER — Other Ambulatory Visit: Payer: Self-pay

## 2020-09-03 ENCOUNTER — Ambulatory Visit (INDEPENDENT_AMBULATORY_CARE_PROVIDER_SITE_OTHER): Payer: 59 | Admitting: Family Medicine

## 2020-09-03 VITALS — BP 106/65 | HR 98 | Wt 194.7 lb

## 2020-09-03 DIAGNOSIS — Z3483 Encounter for supervision of other normal pregnancy, third trimester: Secondary | ICD-10-CM

## 2020-09-03 DIAGNOSIS — F3163 Bipolar disorder, current episode mixed, severe, without psychotic features: Secondary | ICD-10-CM

## 2020-09-03 DIAGNOSIS — Z3480 Encounter for supervision of other normal pregnancy, unspecified trimester: Secondary | ICD-10-CM

## 2020-09-03 DIAGNOSIS — O24419 Gestational diabetes mellitus in pregnancy, unspecified control: Secondary | ICD-10-CM

## 2020-09-03 NOTE — Patient Instructions (Signed)
 Contraception Choices Contraception, also called birth control, refers to methods or devices that prevent pregnancy. Hormonal methods Contraceptive implant A contraceptive implant is a thin, plastic tube that contains a hormone that prevents pregnancy. It is different from an intrauterine device (IUD). It is inserted into the upper part of the arm by a health care provider. Implants can be effective for up to 3 years. Progestin-only injections Progestin-only injections are injections of progestin, a synthetic form of the hormone progesterone. They are given every 3 months by a health care provider. Birth control pills Birth control pills are pills that contain hormones that prevent pregnancy. They must be taken once a day, preferably at the same time each day. A prescription is needed to use this method of contraception. Birth control patch The birth control patch contains hormones that prevent pregnancy. It is placed on the skin and must be changed once a week for three weeks and removed on the fourth week. A prescription is needed to use this method of contraception. Vaginal ring A vaginal ring contains hormones that prevent pregnancy. It is placed in the vagina for three weeks and removed on the fourth week. After that, the process is repeated with a new ring. A prescription is needed to use this method of contraception. Emergency contraceptive Emergency contraceptives prevent pregnancy after unprotected sex. They come in pill form and can be taken up to 5 days after sex. They work best the sooner they are taken after having sex. Most emergency contraceptives are available without a prescription. This method should not be used as your only form of birth control.   Barrier methods Female condom A female condom is a thin sheath that is worn over the penis during sex. Condoms keep sperm from going inside a woman's body. They can be used with a sperm-killing substance (spermicide) to increase their  effectiveness. They should be thrown away after one use. Female condom A female condom is a soft, loose-fitting sheath that is put into the vagina before sex. The condom keeps sperm from going inside a woman's body. They should be thrown away after one use. Diaphragm A diaphragm is a soft, dome-shaped barrier. It is inserted into the vagina before sex, along with a spermicide. The diaphragm blocks sperm from entering the uterus, and the spermicide kills sperm. A diaphragm should be left in the vagina for 6-8 hours after sex and removed within 24 hours. A diaphragm is prescribed and fitted by a health care provider. A diaphragm should be replaced every 1-2 years, after giving birth, after gaining more than 15 lb (6.8 kg), and after pelvic surgery. Cervical cap A cervical cap is a round, soft latex or plastic cup that fits over the cervix. It is inserted into the vagina before sex, along with spermicide. It blocks sperm from entering the uterus. The cap should be left in place for 6-8 hours after sex and removed within 48 hours. A cervical cap must be prescribed and fitted by a health care provider. It should be replaced every 2 years. Sponge A sponge is a soft, circular piece of polyurethane foam with spermicide in it. The sponge helps block sperm from entering the uterus, and the spermicide kills sperm. To use it, you make it wet and then insert it into the vagina. It should be inserted before sex, left in for at least 6 hours after sex, and removed and thrown away within 30 hours. Spermicides Spermicides are chemicals that kill or block sperm from entering the   cervix and uterus. They can come as a cream, jelly, suppository, foam, or tablet. A spermicide should be inserted into the vagina with an applicator at least 10-15 minutes before sex to allow time for it to work. The process must be repeated every time you have sex. Spermicides do not require a prescription.   Intrauterine  contraception Intrauterine device (IUD) An IUD is a T-shaped device that is put in a woman's uterus. There are two types:  Hormone IUD.This type contains progestin, a synthetic form of the hormone progesterone. This type can stay in place for 3-5 years.  Copper IUD.This type is wrapped in copper wire. It can stay in place for 10 years. Permanent methods of contraception Female tubal ligation In this method, a woman's fallopian tubes are sealed, tied, or blocked during surgery to prevent eggs from traveling to the uterus. Hysteroscopic sterilization In this method, a small, flexible insert is placed into each fallopian tube. The inserts cause scar tissue to form in the fallopian tubes and block them, so sperm cannot reach an egg. The procedure takes about 3 months to be effective. Another form of birth control must be used during those 3 months. Female sterilization This is a procedure to tie off the tubes that carry sperm (vasectomy). After the procedure, the man can still ejaculate fluid (semen). Another form of birth control must be used for 3 months after the procedure. Natural planning methods Natural family planning In this method, a couple does not have sex on days when the woman could become pregnant. Calendar method In this method, the woman keeps track of the length of each menstrual cycle, identifies the days when pregnancy can happen, and does not have sex on those days. Ovulation method In this method, a couple avoids sex during ovulation. Symptothermal method This method involves not having sex during ovulation. The woman typically checks for ovulation by watching changes in her temperature and in the consistency of cervical mucus. Post-ovulation method In this method, a couple waits to have sex until after ovulation. Where to find more information  Centers for Disease Control and Prevention: www.cdc.gov Summary  Contraception, also called birth control, refers to methods or  devices that prevent pregnancy.  Hormonal methods of contraception include implants, injections, pills, patches, vaginal rings, and emergency contraceptives.  Barrier methods of contraception can include female condoms, female condoms, diaphragms, cervical caps, sponges, and spermicides.  There are two types of IUDs (intrauterine devices). An IUD can be put in a woman's uterus to prevent pregnancy for 3-5 years.  Permanent sterilization can be done through a procedure for males and females. Natural family planning methods involve nothaving sex on days when the woman could become pregnant. This information is not intended to replace advice given to you by your health care provider. Make sure you discuss any questions you have with your health care provider. Document Revised: 08/25/2019 Document Reviewed: 08/25/2019 Elsevier Patient Education  2021 [employer] Inc.   Breastfeeding  Choosing to breastfeed is one of the best decisions you can make for yourself and your baby. A change in hormones during pregnancy causes your breasts to make breast milk in your milk-producing glands. Hormones prevent breast milk from being released before your baby is born. They also prompt milk flow after birth. Once breastfeeding has begun, thoughts of your baby, as well as his or her sucking or crying, can stimulate the release of milk from your milk-producing glands. Benefits of breastfeeding Research shows that breastfeeding offers many health benefits   for infants and mothers. It also offers a cost-free and convenient way to feed your baby. For your baby  Your first milk (colostrum) helps your baby's digestive system to function better.  Special cells in your milk (antibodies) help your baby to fight off infections.  Breastfed babies are less likely to develop asthma, allergies, obesity, or type 2 diabetes. They are also at lower risk for sudden infant death syndrome (SIDS).  Nutrients in breast milk are better  able to meet your baby's needs compared to infant formula.  Breast milk improves your baby's brain development. For you  Breastfeeding helps to create a very special bond between you and your baby.  Breastfeeding is convenient. Breast milk costs nothing and is always available at the correct temperature.  Breastfeeding helps to burn calories. It helps you to lose the weight that you gained during pregnancy.  Breastfeeding makes your uterus return faster to its size before pregnancy. It also slows bleeding (lochia) after you give birth.  Breastfeeding helps to lower your risk of developing type 2 diabetes, osteoporosis, rheumatoid arthritis, cardiovascular disease, and breast, ovarian, uterine, and endometrial cancer later in life. Breastfeeding basics Starting breastfeeding  Find a comfortable place to sit or lie down, with your neck and back well-supported.  Place a pillow or a rolled-up blanket under your baby to bring him or her to the level of your breast (if you are seated). Nursing pillows are specially designed to help support your arms and your baby while you breastfeed.  Make sure that your baby's tummy (abdomen) is facing your abdomen.  Gently massage your breast. With your fingertips, massage from the outer edges of your breast inward toward the nipple. This encourages milk flow. If your milk flows slowly, you may need to continue this action during the feeding.  Support your breast with 4 fingers underneath and your thumb above your nipple (make the letter "C" with your hand). Make sure your fingers are well away from your nipple and your baby's mouth.  Stroke your baby's lips gently with your finger or nipple.  When your baby's mouth is open wide enough, quickly bring your baby to your breast, placing your entire nipple and as much of the areola as possible into your baby's mouth. The areola is the colored area around your nipple. ? More areola should be visible above your  baby's upper lip than below the lower lip. ? Your baby's lips should be opened and extended outward (flanged) to ensure an adequate, comfortable latch. ? Your baby's tongue should be between his or her lower gum and your breast.  Make sure that your baby's mouth is correctly positioned around your nipple (latched). Your baby's lips should create a seal on your breast and be turned out (everted).  It is common for your baby to suck about 2-3 minutes in order to start the flow of breast milk. Latching Teaching your baby how to latch onto your breast properly is very important. An improper latch can cause nipple pain, decreased milk supply, and poor weight gain in your baby. Also, if your baby is not latched onto your nipple properly, he or she may swallow some air during feeding. This can make your baby fussy. Burping your baby when you switch breasts during the feeding can help to get rid of the air. However, teaching your baby to latch on properly is still the best way to prevent fussiness from swallowing air while breastfeeding. Signs that your baby has successfully latched onto   your nipple  Silent tugging or silent sucking, without causing you pain. Infant's lips should be extended outward (flanged).  Swallowing heard between every 3-4 sucks once your milk has started to flow (after your let-down milk reflex occurs).  Muscle movement above and in front of his or her ears while sucking. Signs that your baby has not successfully latched onto your nipple  Sucking sounds or smacking sounds from your baby while breastfeeding.  Nipple pain. If you think your baby has not latched on correctly, slip your finger into the corner of your baby's mouth to break the suction and place it between your baby's gums. Attempt to start breastfeeding again. Signs of successful breastfeeding Signs from your baby  Your baby will gradually decrease the number of sucks or will completely stop sucking.  Your baby  will fall asleep.  Your baby's body will relax.  Your baby will retain a small amount of milk in his or her mouth.  Your baby will let go of your breast by himself or herself. Signs from you  Breasts that have increased in firmness, weight, and size 1-3 hours after feeding.  Breasts that are softer immediately after breastfeeding.  Increased milk volume, as well as a change in milk consistency and color by the fifth day of breastfeeding.  Nipples that are not sore, cracked, or bleeding. Signs that your baby is getting enough milk  Wetting at least 1-2 diapers during the first 24 hours after birth.  Wetting at least 5-6 diapers every 24 hours for the first week after birth. The urine should be clear or pale yellow by the age of 5 days.  Wetting 6-8 diapers every 24 hours as your baby continues to grow and develop.  At least 3 stools in a 24-hour period by the age of 5 days. The stool should be soft and yellow.  At least 3 stools in a 24-hour period by the age of 7 days. The stool should be seedy and yellow.  No loss of weight greater than 10% of birth weight during the first 3 days of life.  Average weight gain of 4-7 oz (113-198 g) per week after the age of 4 days.  Consistent daily weight gain by the age of 5 days, without weight loss after the age of 2 weeks. After a feeding, your baby may spit up a small amount of milk. This is normal. Breastfeeding frequency and duration Frequent feeding will help you make more milk and can prevent sore nipples and extremely full breasts (breast engorgement). Breastfeed when you feel the need to reduce the fullness of your breasts or when your baby shows signs of hunger. This is called "breastfeeding on demand." Signs that your baby is hungry include:  Increased alertness, activity, or restlessness.  Movement of the head from side to side.  Opening of the mouth when the corner of the mouth or cheek is stroked (rooting).  Increased  sucking sounds, smacking lips, cooing, sighing, or squeaking.  Hand-to-mouth movements and sucking on fingers or hands.  Fussing or crying. Avoid introducing a pacifier to your baby in the first 4-6 weeks after your baby is born. After this time, you may choose to use a pacifier. Research has shown that pacifier use during the first year of a baby's life decreases the risk of sudden infant death syndrome (SIDS). Allow your baby to feed on each breast as long as he or she wants. When your baby unlatches or falls asleep while feeding from the   first breast, offer the second breast. Because newborns are often sleepy in the first few weeks of life, you may need to awaken your baby to get him or her to feed. Breastfeeding times will vary from baby to baby. However, the following rules can serve as a guide to help you make sure that your baby is properly fed:  Newborns (babies 4 weeks of age or younger) may breastfeed every 1-3 hours.  Newborns should not go without breastfeeding for longer than 3 hours during the day or 5 hours during the night.  You should breastfeed your baby a minimum of 8 times in a 24-hour period. Breast milk pumping Pumping and storing breast milk allows you to make sure that your baby is exclusively fed your breast milk, even at times when you are unable to breastfeed. This is especially important if you go back to work while you are still breastfeeding, or if you are not able to be present during feedings. Your lactation consultant can help you find a method of pumping that works best for you and give you guidelines about how long it is safe to store breast milk.      Caring for your breasts while you breastfeed Nipples can become dry, cracked, and sore while breastfeeding. The following recommendations can help keep your breasts moisturized and healthy:  Avoid using soap on your nipples.  Wear a supportive bra designed especially for nursing. Avoid wearing underwire-style  bras or extremely tight bras (sports bras).  Air-dry your nipples for 3-4 minutes after each feeding.  Use only cotton bra pads to absorb leaked breast milk. Leaking of breast milk between feedings is normal.  Use lanolin on your nipples after breastfeeding. Lanolin helps to maintain your skin's normal moisture barrier. Pure lanolin is not harmful (not toxic) to your baby. You may also hand express a few drops of breast milk and gently massage that milk into your nipples and allow the milk to air-dry. In the first few weeks after giving birth, some women experience breast engorgement. Engorgement can make your breasts feel heavy, warm, and tender to the touch. Engorgement peaks within 3-5 days after you give birth. The following recommendations can help to ease engorgement:  Completely empty your breasts while breastfeeding or pumping. You may want to start by applying warm, moist heat (in the shower or with warm, water-soaked hand towels) just before feeding or pumping. This increases circulation and helps the milk flow. If your baby does not completely empty your breasts while breastfeeding, pump any extra milk after he or she is finished.  Apply ice packs to your breasts immediately after breastfeeding or pumping, unless this is too uncomfortable for you. To do this: ? Put ice in a plastic bag. ? Place a towel between your skin and the bag. ? Leave the ice on for 20 minutes, 2-3 times a day.  Make sure that your baby is latched on and positioned properly while breastfeeding. If engorgement persists after 48 hours of following these recommendations, contact your health care provider or a lactation consultant. Overall health care recommendations while breastfeeding  Eat 3 healthy meals and 3 snacks every day. Well-nourished mothers who are breastfeeding need an additional 450-500 calories a day. You can meet this requirement by increasing the amount of a balanced diet that you eat.  Drink  enough water to keep your urine pale yellow or clear.  Rest often, relax, and continue to take your prenatal vitamins to prevent fatigue, stress, and low   vitamin and mineral levels in your body (nutrient deficiencies).  Do not use any products that contain nicotine or tobacco, such as cigarettes and e-cigarettes. Your baby may be harmed by chemicals from cigarettes that pass into breast milk and exposure to secondhand smoke. If you need help quitting, ask your health care provider.  Avoid alcohol.  Do not use illegal drugs or marijuana.  Talk with your health care provider before taking any medicines. These include over-the-counter and prescription medicines as well as vitamins and herbal supplements. Some medicines that may be harmful to your baby can pass through breast milk.  It is possible to become pregnant while breastfeeding. If birth control is desired, ask your health care provider about options that will be safe while breastfeeding your baby. Where to find more information: [employer]: www.llli.org Contact a health care provider if:  You feel like you want to stop breastfeeding or have become frustrated with breastfeeding.  Your nipples are cracked or bleeding.  Your breasts are red, tender, or warm.  You have: ? Painful breasts or nipples. ? A swollen area on either breast. ? A fever or chills. ? Nausea or vomiting. ? Drainage other than breast milk from your nipples.  Your breasts do not become full before feedings by the fifth day after you give birth.  You feel sad and depressed.  Your baby is: ? Too sleepy to eat well. ? Having trouble sleeping. ? More than 1 week old and wetting fewer than 6 diapers in a 24-hour period. ? Not gaining weight by 5 days of age.  Your baby has fewer than 3 stools in a 24-hour period.  Your baby's skin or the white parts of his or her eyes become yellow. Get help right away if:  Your baby is overly tired  (lethargic) and does not want to wake up and feed.  Your baby develops an unexplained fever. Summary  Breastfeeding offers many health benefits for infant and mothers.  Try to breastfeed your infant when he or she shows early signs of hunger.  Gently tickle or stroke your baby's lips with your finger or nipple to allow the baby to open his or her mouth. Bring the baby to your breast. Make sure that much of the areola is in your baby's mouth. Offer one side and burp the baby before you offer the other side.  Talk with your health care provider or lactation consultant if you have questions or you face problems as you breastfeed. This information is not intended to replace advice given to you by your health care provider. Make sure you discuss any questions you have with your health care provider. Document Revised: 06/14/2017 Document Reviewed: 04/21/2016 Elsevier Patient Education  2021 Elsevier Inc.  

## 2020-09-03 NOTE — Progress Notes (Signed)
   Subjective:  Cheryl Cohen is a 30 y.o. G3P0110 at [redacted]w[redacted]d being seen today for ongoing prenatal care.  She is currently monitored for the following issues for this high-risk pregnancy and has Family history of diabetes mellitus type II; Suicidal ideation; Fetal demise, greater than 22 weeks, antepartum; Bipolar affective disorder, mixed, severe (HCC); Psychotic disorder with delusions (HCC); Supervision of normal pregnancy, antepartum; and Gestational diabetes mellitus (GDM), antepartum on their problem list.  Patient reports no complaints.  Contractions: Not present. Vag. Bleeding: None.  Movement: Present. Denies leaking of fluid.   The following portions of the patient's history were reviewed and updated as appropriate: allergies, current medications, past family history, past medical history, past social history, past surgical history and problem list. Problem list updated.  Objective:   Vitals:   09/03/20 0959  BP: 106/65  Pulse: 98  Weight: 194 lb 11.2 oz (88.3 kg)    Fetal Status: Fetal Heart Rate (bpm): 134 Fundal Height: 30 cm Movement: Present     General:  Alert, oriented and cooperative. Patient is in no acute distress.  Skin: Skin is warm and dry. No rash noted.   Cardiovascular: Normal heart rate noted  Respiratory: Normal respiratory effort, no problems with respiration noted  Abdomen: Soft, gravid, appropriate for gestational age. Pain/Pressure: Absent     Pelvic: Vag. Bleeding: None     Cervical exam deferred        Extremities: Normal range of motion.  Edema: None  Mental Status: Normal mood and affect. Normal behavior. Normal judgment and thought content.   Urinalysis:      Assessment and Plan:  Pregnancy: G3P0110 at [redacted]w[redacted]d  1. Supervision of other normal pregnancy, antepartum BP and FHR normal  2. Gestational diabetes mellitus (GDM), antepartum, gestational diabetes method of control unspecified Did not bring log Checks intermittently only, usually at  night Does not check fastings or mid day values Emphasized importance of QID checks, reviewed risks of uncontrolled GDM including stillbirth, macrosomia, labor/shoulder dystocias, and postpartum glycemic issues for infant Patient will check QID and review sugar log at next visit  3. Bipolar affective disorder, mixed, severe (HCC) Feels well Taking olanzapine every day [facility] with [facility]  Preterm labor symptoms and general obstetric precautions including but not limited to vaginal bleeding, contractions, leaking of fluid and fetal movement were reviewed in detail with the patient. Please refer to After Visit Summary for other counseling recommendations.  Return in 2 weeks (on 09/17/2020) for [facility], ob visit, needs MD.   Venora Maples, MD

## 2020-09-03 NOTE — Progress Notes (Signed)
CBGs in the 140-150 range.

## 2020-09-14 ENCOUNTER — Telehealth: Payer: Self-pay

## 2020-09-14 NOTE — Telephone Encounter (Signed)
FETAL ECHO SCHEDULED FOR 09/14/2020.

## 2020-09-16 ENCOUNTER — Ambulatory Visit (INDEPENDENT_AMBULATORY_CARE_PROVIDER_SITE_OTHER): Payer: 59 | Admitting: Obstetrics and Gynecology

## 2020-09-16 ENCOUNTER — Other Ambulatory Visit: Payer: Self-pay

## 2020-09-16 VITALS — BP 109/65 | HR 88 | Wt 197.2 lb

## 2020-09-16 DIAGNOSIS — Z3A32 32 weeks gestation of pregnancy: Secondary | ICD-10-CM

## 2020-09-16 DIAGNOSIS — O24419 Gestational diabetes mellitus in pregnancy, unspecified control: Secondary | ICD-10-CM

## 2020-09-16 DIAGNOSIS — Z3480 Encounter for supervision of other normal pregnancy, unspecified trimester: Secondary | ICD-10-CM

## 2020-09-16 DIAGNOSIS — F3163 Bipolar disorder, current episode mixed, severe, without psychotic features: Secondary | ICD-10-CM

## 2020-09-16 NOTE — Progress Notes (Signed)
   PRENATAL VISIT NOTE  Subjective:  Cheryl Cohen is a 30 y.o. G3P0110 at [redacted]w[redacted]d being seen today for ongoing prenatal care.  She is currently monitored for the following issues for this high-risk pregnancy and has Family history of diabetes mellitus type II; Suicidal ideation; Fetal demise, greater than 22 weeks, antepartum; Bipolar affective disorder, mixed, severe (HCC); Psychotic disorder with delusions (HCC); Supervision of normal pregnancy, antepartum; and Gestational diabetes mellitus (GDM), antepartum on their problem list.  Patient reports no complaints.  Contractions: Not present. Vag. Bleeding: None.  Movement: Present. Denies leaking of fluid.    Doing well. Reports monitoring her blood glucose at home on machine but not writing it down. Not consistently checking her fasting sugar but she says her post prandial are all around 110.   Does not want to be on birth control post partum.   The following portions of the patient's history were reviewed and updated as appropriate: allergies, current medications, past family history, past medical history, past social history, past surgical history and problem list.   Objective:   Vitals:   09/16/20 1448  BP: 109/65  Pulse: 88  Weight: 197 lb 3.2 oz (89.4 kg)    Fetal Status: Fetal Heart Rate (bpm): 143   Movement: Present     General:  Alert, oriented and cooperative. Patient is in no acute distress.  Skin: Skin is warm and dry. No rash noted.   Cardiovascular: Normal heart rate noted  Respiratory: Normal respiratory effort, no problems with respiration noted  Abdomen: Soft, gravid, appropriate for gestational age.  Pain/Pressure: Absent     Pelvic: Cervical exam deferred        Extremities: Normal range of motion.  Edema: None  Mental Status: Normal mood and affect. Normal behavior. Normal judgment and thought content.   Assessment and Plan:  Pregnancy: G3P0110 at [redacted]w[redacted]d 1. Supervision of other normal pregnancy,  antepartum Discussed birth control options, patient declines.   2. Gestational diabetes mellitus (GDM), antepartum, gestational diabetes method of control unspecified -counseled on taking AM cbg as well as postprandial. She has had extensive counseling on this already. Overall does sound like her CBG have been appropriate  3. Bipolar affective disorder, mixed, severe (HCC) On olanzapine, recently saw Sue Lush  4. [redacted] weeks gestation of pregnancy   Preterm labor symptoms and general obstetric precautions including but not limited to vaginal bleeding, contractions, leaking of fluid and fetal movement were reviewed in detail with the patient. Please refer to After Visit Summary for other counseling recommendations.   No follow-ups on file.  Future Appointments  Date Time Provider Department Center  10/13/2020  9:30 AM [facility] NURSE [facility]  10/13/2020  9:45 AM [facility]    Gita Kudo, MD

## 2020-09-16 NOTE — Progress Notes (Signed)
Pt reports fetal movement, denies pain.  

## 2020-09-30 ENCOUNTER — Encounter: Payer: 59 | Admitting: Advanced Practice Midwife

## 2020-10-05 ENCOUNTER — Other Ambulatory Visit: Payer: Self-pay

## 2020-10-05 ENCOUNTER — Ambulatory Visit (INDEPENDENT_AMBULATORY_CARE_PROVIDER_SITE_OTHER): Payer: 59 | Admitting: Obstetrics

## 2020-10-05 ENCOUNTER — Encounter: Payer: Self-pay | Admitting: Obstetrics

## 2020-10-05 VITALS — BP 113/67 | HR 99 | Wt 198.6 lb

## 2020-10-05 DIAGNOSIS — Z8659 Personal history of other mental and behavioral disorders: Secondary | ICD-10-CM

## 2020-10-05 DIAGNOSIS — Z3480 Encounter for supervision of other normal pregnancy, unspecified trimester: Secondary | ICD-10-CM

## 2020-10-05 DIAGNOSIS — O24419 Gestational diabetes mellitus in pregnancy, unspecified control: Secondary | ICD-10-CM

## 2020-10-05 NOTE — Progress Notes (Signed)
Subjective:  Cheryl Cohen is a 30 y.o. G3P0110 at [redacted]w[redacted]d being seen today for ongoing prenatal care.  She is currently monitored for the following issues for this high-risk pregnancy and has Family history of diabetes mellitus type II; Suicidal ideation; Fetal demise, greater than 22 weeks, antepartum; Bipolar affective disorder, mixed, severe (HCC); Psychotic disorder with delusions (HCC); Supervision of normal pregnancy, antepartum; and Gestational diabetes mellitus (GDM), antepartum on their problem list.  Patient reports backache, occasional contractions, and pelvic pressure .  Contractions: Not present. Vag. Bleeding: None.  Movement: Present. Denies leaking of fluid.   The following portions of the patient's history were reviewed and updated as appropriate: allergies, current medications, past family history, past medical history, past social history, past surgical history and problem list. Problem list updated.  Objective:   Vitals:   10/05/20 1608  BP: 113/67  Pulse: 99  Weight: 198 lb 9.6 oz (90.1 kg)    Fetal Status:     Movement: Present     General:  Alert, oriented and cooperative. Patient is in no acute distress.  Skin: Skin is warm and dry. No rash noted.   Cardiovascular: Normal heart rate noted  Respiratory: Normal respiratory effort, no problems with respiration noted  Abdomen: Soft, gravid, appropriate for gestational age. Pain/Pressure: Present     Pelvic:  Cervical exam deferred        Extremities: Normal range of motion.  Edema: None  Mental Status: Normal mood and affect. Normal behavior. Normal judgment and thought content.   Urinalysis:      Assessment and Plan:  Pregnancy: G3P0110 at [redacted]w[redacted]d  1. Supervision of other normal pregnancy, antepartum  2. Gestational diabetes mellitus (GDM), antepartum, gestational diabetes method of control unspecified - did not bring glucose log but says that glucose testing is ok with FBS's < 100 and 2 hour PP's < 140  3.  History of bipolar disorder - clinically stable   There are no diagnoses linked to this encounter. Preterm labor symptoms and general obstetric precautions including but not limited to vaginal bleeding, contractions, leaking of fluid and fetal movement were reviewed in detail with the patient. Please refer to After Visit Summary for other counseling recommendations.   Return in about 2 weeks (around 10/19/2020) for [facility].   Brock Bad, MD  10/05/20

## 2020-10-05 NOTE — Progress Notes (Signed)
   Patient in clinic for ROB at [redacted]w[redacted]d. Patient reported having constant pelvic pressure for 1 month and milky white vaginal discharge for "months". Denies odor, itching and burning.   Clovis Pu, RN

## 2020-10-13 ENCOUNTER — Ambulatory Visit: Payer: 59 | Attending: Obstetrics and Gynecology

## 2020-10-13 ENCOUNTER — Ambulatory Visit: Payer: 59 | Admitting: *Deleted

## 2020-10-13 ENCOUNTER — Other Ambulatory Visit: Payer: Self-pay

## 2020-10-13 ENCOUNTER — Ambulatory Visit (HOSPITAL_BASED_OUTPATIENT_CLINIC_OR_DEPARTMENT_OTHER): Payer: 59 | Admitting: Maternal & Fetal Medicine

## 2020-10-13 ENCOUNTER — Other Ambulatory Visit: Payer: Self-pay | Admitting: *Deleted

## 2020-10-13 ENCOUNTER — Encounter: Payer: Self-pay | Admitting: *Deleted

## 2020-10-13 VITALS — BP 112/62 | HR 88

## 2020-10-13 DIAGNOSIS — O24410 Gestational diabetes mellitus in pregnancy, diet controlled: Secondary | ICD-10-CM

## 2020-10-13 DIAGNOSIS — O09891 Supervision of other high risk pregnancies, first trimester: Secondary | ICD-10-CM | POA: Diagnosis present

## 2020-10-13 DIAGNOSIS — O09213 Supervision of pregnancy with history of pre-term labor, third trimester: Secondary | ICD-10-CM | POA: Diagnosis present

## 2020-10-13 DIAGNOSIS — O24419 Gestational diabetes mellitus in pregnancy, unspecified control: Secondary | ICD-10-CM | POA: Diagnosis present

## 2020-10-13 DIAGNOSIS — F319 Bipolar disorder, unspecified: Secondary | ICD-10-CM | POA: Diagnosis present

## 2020-10-13 DIAGNOSIS — Z3A35 35 weeks gestation of pregnancy: Secondary | ICD-10-CM

## 2020-10-13 DIAGNOSIS — O99343 Other mental disorders complicating pregnancy, third trimester: Secondary | ICD-10-CM

## 2020-10-13 DIAGNOSIS — O99340 Other mental disorders complicating pregnancy, unspecified trimester: Secondary | ICD-10-CM | POA: Diagnosis present

## 2020-10-13 DIAGNOSIS — O09893 Supervision of other high risk pregnancies, third trimester: Secondary | ICD-10-CM

## 2020-10-14 ENCOUNTER — Other Ambulatory Visit: Payer: Self-pay

## 2020-10-14 MED ORDER — PROMETHAZINE HCL 25 MG PO TABS: 25.0000 mg | ORAL_TABLET | Freq: Four times a day (QID) | ORAL | 0 refills | Status: DC | PRN

## 2020-10-14 NOTE — Progress Notes (Signed)
MFM Brief Note  Cheryl Cohen is a 30 yo G3P0 who is here in consultation for growth and A1GDM  Normal interval growth with measurements consistent with dates Good fetal movement and amniotic fluid volume   Cheryl Cohen blood sugars are reported to be well controlled however, she has not reported her blood sugar logs. Overall growth and amniotic fluid are normal today. I reviewed her blood sugar log on her glucometer- her testing was sporadic with 50% abnormal. SHe explained that she does not wake up before 10 to check her blood sugar. Many of her blood sugars are between 2-and 3pm and after 10 pm. She had two fasting blood sugars with one being abnormal of 101 and the other was 87.   Given her schedule I asked her to report a fasting and a dinner. She felt that she could commit today. She will return in 1 week to reassess.  Recommend NST/AFI in 1 week.   I spent 20 minutes with > 50% in face to face consultation.  All questions answered  Novella Olive, MD

## 2020-10-14 NOTE — Progress Notes (Signed)
Pt c/o N&V.  Phenergan sent to the pharmacy per protocol.

## 2020-10-19 ENCOUNTER — Other Ambulatory Visit (HOSPITAL_COMMUNITY)
Admission: RE | Admit: 2020-10-19 | Discharge: 2020-10-19 | Disposition: A | Discharge status: Home / Self Care | Payer: 59 | Source: Ambulatory Visit | Attending: Nurse Practitioner | Admitting: Nurse Practitioner

## 2020-10-19 ENCOUNTER — Ambulatory Visit (INDEPENDENT_AMBULATORY_CARE_PROVIDER_SITE_OTHER): Payer: 59 | Admitting: Nurse Practitioner

## 2020-10-19 ENCOUNTER — Other Ambulatory Visit: Payer: Self-pay

## 2020-10-19 VITALS — BP 112/73 | HR 93 | Wt 201.6 lb

## 2020-10-19 DIAGNOSIS — O2693 Pregnancy related conditions, unspecified, third trimester: Secondary | ICD-10-CM | POA: Insufficient documentation

## 2020-10-19 DIAGNOSIS — Z3A36 36 weeks gestation of pregnancy: Secondary | ICD-10-CM | POA: Insufficient documentation

## 2020-10-19 DIAGNOSIS — O24419 Gestational diabetes mellitus in pregnancy, unspecified control: Secondary | ICD-10-CM

## 2020-10-19 DIAGNOSIS — Z3480 Encounter for supervision of other normal pregnancy, unspecified trimester: Secondary | ICD-10-CM

## 2020-10-19 NOTE — Progress Notes (Signed)
    Subjective:  Cheryl Cohen is a 30 y.o. G3P0110 at [redacted]w[redacted]d being seen today for ongoing prenatal care.  She is currently monitored for the following issues for this high-risk pregnancy and has Family history of diabetes mellitus type II; Suicidal ideation; Fetal demise, greater than 22 weeks, antepartum; Bipolar affective disorder, mixed, severe (HCC); Psychotic disorder with delusions (HCC); Supervision of normal pregnancy, antepartum; and Gestational diabetes mellitus (GDM), antepartum on their problem list.  Patient reports no complaints and doing well today but did have a stomach bug last week.  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  Contractions: Not present. Vag. Bleeding: None.  Movement: Present. Denies leaking of fluid.   The following portions of the patient's history were reviewed and updated as appropriate: allergies, current medications, past family history, past medical history, past social history, past surgical history and problem list. Problem list updated.  Objective:   Vitals:   10/19/20 1607  BP: 112/73  Pulse: 93  Weight: 201 lb 9.6 oz (91.4 kg)    Fetal Status: Fetal Heart Rate (bpm): 145 Fundal Height: 36 cm Movement: Present     General:  Alert, oriented and cooperative. Patient is in no acute distress.  Skin: Skin is warm and dry. No rash noted.   Cardiovascular: Normal heart rate noted  Respiratory: Normal respiratory effort, no problems with respiration noted  Abdomen: Soft, gravid, appropriate for gestational age. Pain/Pressure: Absent     Pelvic:  Cervical exam deferred     vaginal swabs done  Extremities: Normal range of motion.  Edema: None  Mental Status: Normal mood and affect. Normal behavior. Normal judgment and thought content.   Urinalysis:      Assessment and Plan:  Pregnancy: G3P0110 at [redacted]w[redacted]d  1. Supervision of other normal pregnancy, antepartum Reports baby is moving well In good spirits - plans to be  on maternity leave on July 30.  2. Gestational diabetes mellitus (GDM), antepartum, gestational diabetes method of control unspecified Did not bring meter or dairy - reports fasting is 90-100 and 2 hr PP is 110-120. Advised that she will need to bring meter or written record to all [place] and Prenatal appointments. Takin Zyprexa daily Has [place] scheduled for 10-22-20 for NST and AFI MFM had encouraged her to be checking her sugars as well.  Her recall today of her levels shows that some are still high particularly fasting levels.  Will likely need NST weekly as glucose levels are not all normal (unless visualizing actual levels shows normal levels)  3. [redacted] weeks gestation of pregnancy  - Culture, beta strep (group b only) - GC/Chlamydia probe amp ([place])not at [facility]  Term labor symptoms and general obstetric precautions including but not limited to vaginal bleeding, contractions, leaking of fluid and fetal movement were reviewed in detail with the patient. Please refer to After Visit Summary for other counseling recommendations.  Return in about 1 week (around 10/29/2020) for in person ROB.  Nolene Bernheim, RN, MSN, NP-BC Nurse Practitioner, [facility] for [employer], [facility] 10/19/2020 4:23 PM

## 2020-10-20 LAB — GC/CHLAMYDIA PROBE AMP (~~LOC~~) NOT AT ARMC
Chlamydia: NEGATIVE
Comment: NEGATIVE
Comment: NORMAL
Neisseria Gonorrhea: NEGATIVE

## 2020-10-22 ENCOUNTER — Ambulatory Visit: Payer: 59 | Admitting: *Deleted

## 2020-10-22 ENCOUNTER — Ambulatory Visit (HOSPITAL_BASED_OUTPATIENT_CLINIC_OR_DEPARTMENT_OTHER): Payer: 59 | Admitting: *Deleted

## 2020-10-22 ENCOUNTER — Ambulatory Visit: Payer: 59 | Attending: Maternal & Fetal Medicine

## 2020-10-22 ENCOUNTER — Encounter: Payer: Self-pay | Admitting: *Deleted

## 2020-10-22 ENCOUNTER — Other Ambulatory Visit: Payer: Self-pay

## 2020-10-22 VITALS — BP 110/66 | HR 96

## 2020-10-22 DIAGNOSIS — O24410 Gestational diabetes mellitus in pregnancy, diet controlled: Secondary | ICD-10-CM

## 2020-10-22 DIAGNOSIS — F319 Bipolar disorder, unspecified: Secondary | ICD-10-CM | POA: Diagnosis not present

## 2020-10-22 DIAGNOSIS — O99343 Other mental disorders complicating pregnancy, third trimester: Secondary | ICD-10-CM

## 2020-10-22 DIAGNOSIS — O09893 Supervision of other high risk pregnancies, third trimester: Secondary | ICD-10-CM | POA: Diagnosis not present

## 2020-10-22 DIAGNOSIS — Z3A37 37 weeks gestation of pregnancy: Secondary | ICD-10-CM

## 2020-10-22 DIAGNOSIS — O09213 Supervision of pregnancy with history of pre-term labor, third trimester: Secondary | ICD-10-CM

## 2020-10-22 NOTE — Procedures (Signed)
Cheryl Cohen [birth year] [redacted]w[redacted]d  Fetus A Non-Stress Test Interpretation for 10/22/20  Indication:  A1GDM  Fetal Heart Rate A Mode: External Baseline Rate (A): 140 bpm Variability: Moderate Accelerations: 15 x 15 Decelerations: None Multiple birth?: No  Uterine Activity Mode: Palpation, Toco Contraction Frequency (min): none Resting Tone Palpated: Relaxed Resting Time: Adequate  Interpretation (Fetal Testing) Nonstress Test Interpretation: Reactive Overall Impression: Reassuring for gestational age Comments: Dr. Grace Bushy reviewed tracing

## 2020-10-23 LAB — CULTURE, BETA STREP (GROUP B ONLY): Strep Gp B Culture: NEGATIVE

## 2020-10-25 ENCOUNTER — Other Ambulatory Visit: Payer: Self-pay | Admitting: *Deleted

## 2020-10-25 DIAGNOSIS — O24410 Gestational diabetes mellitus in pregnancy, diet controlled: Secondary | ICD-10-CM

## 2020-10-26 ENCOUNTER — Ambulatory Visit (INDEPENDENT_AMBULATORY_CARE_PROVIDER_SITE_OTHER): Payer: 59 | Admitting: Obstetrics

## 2020-10-26 ENCOUNTER — Encounter: Payer: Self-pay | Admitting: Obstetrics

## 2020-10-26 ENCOUNTER — Other Ambulatory Visit (HOSPITAL_COMMUNITY)
Admission: RE | Admit: 2020-10-26 | Discharge: 2020-10-26 | Disposition: A | Discharge status: Home / Self Care | Payer: 59 | Source: Ambulatory Visit | Attending: Obstetrics | Admitting: Obstetrics

## 2020-10-26 ENCOUNTER — Other Ambulatory Visit: Payer: Self-pay

## 2020-10-26 VITALS — BP 123/74 | HR 104 | Wt 202.4 lb

## 2020-10-26 DIAGNOSIS — Z3480 Encounter for supervision of other normal pregnancy, unspecified trimester: Secondary | ICD-10-CM | POA: Insufficient documentation

## 2020-10-26 DIAGNOSIS — O0990 Supervision of high risk pregnancy, unspecified, unspecified trimester: Secondary | ICD-10-CM

## 2020-10-26 DIAGNOSIS — O26899 Other specified pregnancy related conditions, unspecified trimester: Secondary | ICD-10-CM | POA: Diagnosis present

## 2020-10-26 DIAGNOSIS — N898 Other specified noninflammatory disorders of vagina: Secondary | ICD-10-CM | POA: Diagnosis present

## 2020-10-26 DIAGNOSIS — O24410 Gestational diabetes mellitus in pregnancy, diet controlled: Secondary | ICD-10-CM

## 2020-10-26 NOTE — Progress Notes (Signed)
r  Patient in clinic for ROB. Reporting pelvic pressure and white vaginal discharge. Denies odor, itching or burning. Patient has glucose log with her today. Desires cervix check.  Clovis Pu, RN

## 2020-10-26 NOTE — Progress Notes (Signed)
Subjective:  Cheryl Cohen is a 30 y.o. G3P0110 at [redacted]w[redacted]d being seen today for ongoing prenatal care.  She is currently monitored for the following issues for this high-risk pregnancy and has Family history of diabetes mellitus type II; Suicidal ideation; Fetal demise, greater than 22 weeks, antepartum; Bipolar affective disorder, mixed, severe (HCC); Psychotic disorder with delusions (HCC); Supervision of normal pregnancy, antepartum; and Gestational diabetes mellitus (GDM), antepartum on their problem list.  Patient reports vaginal discharge  Contractions: Not present. Vag. Bleeding: None.  Movement: Present. Denies leaking of fluid.   The following portions of the patient's history were reviewed and updated as appropriate: allergies, current medications, past family history, past medical history, past social history, past surgical history and problem list. Problem list updated.  Objective:   Vitals:   10/26/20 1356  BP: 123/74  Pulse: (!) 104  Weight: 202 lb 6.4 oz (91.8 kg)    Fetal Status:     Movement: Present     General:  Alert, oriented and cooperative. Patient is in no acute distress.  Skin: Skin is warm and dry. No rash noted.   Cardiovascular: Normal heart rate noted  Respiratory: Normal respiratory effort, no problems with respiration noted  Abdomen: Soft, gravid, appropriate for gestational age. Pain/Pressure: Present     Pelvic:  Cervical exam deferred        Extremities: Normal range of motion.  Edema: None  Mental Status: Normal mood and affect. Normal behavior. Normal judgment and thought content.   Urinalysis:      Assessment and Plan:  Pregnancy: G3P0110 at [redacted]w[redacted]d  1. Supervision of high risk pregnancy, antepartum Rx: - Cervicovaginal ancillary only( [place])  2. Diet controlled gestational diabetes mellitus (GDM) in third trimester - glucose log reviewed:  FBS 90-105                                       2 hour PP < 120  3. Vaginal discharge during  pregnancy, antepartum Rx: - Cervicovaginal ancillary only( [place])    Term labor symptoms and general obstetric precautions including but not limited to vaginal bleeding, contractions, leaking of fluid and fetal movement were reviewed in detail with the patient. Please refer to After Visit Summary for other counseling recommendations.   Return in about 1 week (around 11/02/2020) for [facility].   Brock Bad, MD  10/27/20

## 2020-10-27 LAB — CERVICOVAGINAL ANCILLARY ONLY
Bacterial Vaginitis (gardnerella): NEGATIVE
Candida Glabrata: NEGATIVE
Candida Vaginitis: POSITIVE — AB
Chlamydia: NEGATIVE
Comment: NEGATIVE
Comment: NEGATIVE
Comment: NEGATIVE
Comment: NEGATIVE
Comment: NEGATIVE
Comment: NORMAL
Neisseria Gonorrhea: NEGATIVE
Trichomonas: NEGATIVE

## 2020-10-28 ENCOUNTER — Other Ambulatory Visit: Payer: Self-pay | Admitting: Obstetrics

## 2020-10-28 DIAGNOSIS — B3731 Acute candidiasis of vulva and vagina: Secondary | ICD-10-CM

## 2020-10-28 DIAGNOSIS — B373 Candidiasis of vulva and vagina: Secondary | ICD-10-CM

## 2020-10-28 MED ORDER — TERCONAZOLE 0.4 % VA CREA: 1.0000 | TOPICAL_CREAM | Freq: Every day | VAGINAL | 0 refills | Status: DC

## 2020-11-01 ENCOUNTER — Encounter (HOSPITAL_COMMUNITY): Payer: Self-pay | Admitting: *Deleted

## 2020-11-01 ENCOUNTER — Ambulatory Visit (HOSPITAL_BASED_OUTPATIENT_CLINIC_OR_DEPARTMENT_OTHER): Payer: 59

## 2020-11-01 ENCOUNTER — Encounter (HOSPITAL_COMMUNITY): Payer: Self-pay | Admitting: Family Medicine

## 2020-11-01 ENCOUNTER — Ambulatory Visit: Payer: 59 | Admitting: *Deleted

## 2020-11-01 ENCOUNTER — Inpatient Hospital Stay (HOSPITAL_BASED_OUTPATIENT_CLINIC_OR_DEPARTMENT_OTHER): Payer: 59 | Admitting: Obstetrics

## 2020-11-01 ENCOUNTER — Other Ambulatory Visit: Payer: Self-pay

## 2020-11-01 ENCOUNTER — Inpatient Hospital Stay (HOSPITAL_COMMUNITY)
Admission: AD | Admit: 2020-11-01 | Discharge: 2020-11-04 | DRG: 806 | Disposition: A | Discharge status: Home / Self Care | Payer: 59 | Attending: Obstetrics & Gynecology | Admitting: Obstetrics & Gynecology

## 2020-11-01 ENCOUNTER — Ambulatory Visit (HOSPITAL_BASED_OUTPATIENT_CLINIC_OR_DEPARTMENT_OTHER): Payer: 59 | Admitting: *Deleted

## 2020-11-01 ENCOUNTER — Encounter: Payer: Self-pay | Admitting: *Deleted

## 2020-11-01 ENCOUNTER — Telehealth (HOSPITAL_COMMUNITY): Payer: Self-pay | Admitting: *Deleted

## 2020-11-01 VITALS — BP 117/59 | HR 96

## 2020-11-01 DIAGNOSIS — O09893 Supervision of other high risk pregnancies, third trimester: Secondary | ICD-10-CM

## 2020-11-01 DIAGNOSIS — O24419 Gestational diabetes mellitus in pregnancy, unspecified control: Secondary | ICD-10-CM | POA: Diagnosis present

## 2020-11-01 DIAGNOSIS — Z20822 Contact with and (suspected) exposure to covid-19: Secondary | ICD-10-CM | POA: Diagnosis present

## 2020-11-01 DIAGNOSIS — O368330 Maternal care for abnormalities of the fetal heart rate or rhythm, third trimester, not applicable or unspecified: Secondary | ICD-10-CM | POA: Diagnosis not present

## 2020-11-01 DIAGNOSIS — O99343 Other mental disorders complicating pregnancy, third trimester: Secondary | ICD-10-CM | POA: Diagnosis not present

## 2020-11-01 DIAGNOSIS — F319 Bipolar disorder, unspecified: Secondary | ICD-10-CM

## 2020-11-01 DIAGNOSIS — F3163 Bipolar disorder, current episode mixed, severe, without psychotic features: Secondary | ICD-10-CM | POA: Diagnosis present

## 2020-11-01 DIAGNOSIS — Z3A38 38 weeks gestation of pregnancy: Secondary | ICD-10-CM

## 2020-11-01 DIAGNOSIS — O24420 Gestational diabetes mellitus in childbirth, diet controlled: Secondary | ICD-10-CM | POA: Diagnosis present

## 2020-11-01 DIAGNOSIS — O09213 Supervision of pregnancy with history of pre-term labor, third trimester: Secondary | ICD-10-CM | POA: Diagnosis not present

## 2020-11-01 DIAGNOSIS — O368130 Decreased fetal movements, third trimester, not applicable or unspecified: Principal | ICD-10-CM | POA: Diagnosis present

## 2020-11-01 DIAGNOSIS — Z3490 Encounter for supervision of normal pregnancy, unspecified, unspecified trimester: Secondary | ICD-10-CM

## 2020-11-01 DIAGNOSIS — F419 Anxiety disorder, unspecified: Secondary | ICD-10-CM | POA: Diagnosis present

## 2020-11-01 DIAGNOSIS — O288 Other abnormal findings on antenatal screening of mother: Secondary | ICD-10-CM

## 2020-11-01 DIAGNOSIS — O24410 Gestational diabetes mellitus in pregnancy, diet controlled: Secondary | ICD-10-CM

## 2020-11-01 DIAGNOSIS — O99344 Other mental disorders complicating childbirth: Secondary | ICD-10-CM | POA: Diagnosis present

## 2020-11-01 DIAGNOSIS — O364XX0 Maternal care for intrauterine death, not applicable or unspecified: Secondary | ICD-10-CM | POA: Diagnosis present

## 2020-11-01 DIAGNOSIS — O09293 Supervision of pregnancy with other poor reproductive or obstetric history, third trimester: Secondary | ICD-10-CM | POA: Insufficient documentation

## 2020-11-01 DIAGNOSIS — Z8759 Personal history of other complications of pregnancy, childbirth and the puerperium: Secondary | ICD-10-CM | POA: Insufficient documentation

## 2020-11-01 DIAGNOSIS — Z880 Allergy status to penicillin: Secondary | ICD-10-CM | POA: Diagnosis not present

## 2020-11-01 DIAGNOSIS — O26893 Other specified pregnancy related conditions, third trimester: Secondary | ICD-10-CM | POA: Diagnosis present

## 2020-11-01 DIAGNOSIS — O09292 Supervision of pregnancy with other poor reproductive or obstetric history, second trimester: Secondary | ICD-10-CM | POA: Diagnosis not present

## 2020-11-01 DIAGNOSIS — Z87891 Personal history of nicotine dependence: Secondary | ICD-10-CM | POA: Diagnosis not present

## 2020-11-01 LAB — CBC
HCT: 38.2 % (ref 36.0–46.0)
Hemoglobin: 12.9 g/dL (ref 12.0–15.0)
MCH: 31.2 pg (ref 26.0–34.0)
MCHC: 33.8 g/dL (ref 30.0–36.0)
MCV: 92.5 fL (ref 80.0–100.0)
Platelets: 235 10*3/uL (ref 150–400)
RBC: 4.13 MIL/uL (ref 3.87–5.11)
RDW: 13.6 % (ref 11.5–15.5)
WBC: 13.3 10*3/uL — ABNORMAL HIGH (ref 4.0–10.5)
nRBC: 0 % (ref 0.0–0.2)

## 2020-11-01 LAB — GLUCOSE, CAPILLARY
Glucose-Capillary: 90 mg/dL (ref 70–99)
Glucose-Capillary: 99 mg/dL (ref 70–99)
Glucose-Capillary: 87 mg/dL (ref 70–99)

## 2020-11-01 LAB — TYPE AND SCREEN
ABO/RH(D): B POS
Antibody Screen: NEGATIVE

## 2020-11-01 LAB — RESP PANEL BY RT-PCR (FLU A&B, COVID) ARPGX2
Influenza A by PCR: NEGATIVE
Influenza B by PCR: NEGATIVE
SARS Coronavirus 2 by RT PCR: NEGATIVE

## 2020-11-01 MED ORDER — LACTATED RINGERS IV SOLN
500.0000 mL | INTRAVENOUS | Status: DC | PRN
Start: 2020-11-01 — End: 2020-11-03

## 2020-11-01 MED ORDER — LIDOCAINE HCL (PF) 1 % IJ SOLN
30.0000 mL | INTRAMUSCULAR | Status: DC | PRN
Start: 2020-11-01 — End: 2020-11-03

## 2020-11-01 MED ORDER — TERBUTALINE SULFATE 1 MG/ML IJ SOLN: 0.2500 mg | Freq: Once | INTRAMUSCULAR | Status: DC | PRN

## 2020-11-01 MED ORDER — OXYCODONE-ACETAMINOPHEN 5-325 MG PO TABS: 2.0000 | ORAL_TABLET | ORAL | Status: DC | PRN

## 2020-11-01 MED ORDER — OLANZAPINE 5 MG PO TABS
15.0000 mg | ORAL_TABLET | Freq: Every day | ORAL | Status: DC
  Administered 2020-11-01 – 2020-11-03 (×2): 15 mg via ORAL
  Filled 2020-11-01 (×4): qty 1

## 2020-11-01 MED ORDER — ONDANSETRON HCL 4 MG/2ML IJ SOLN
4.0000 mg | Freq: Four times a day (QID) | INTRAMUSCULAR | Status: DC | PRN
  Administered 2020-11-02 (×2): 4 mg via INTRAVENOUS
  Filled 2020-11-01 (×2): qty 2

## 2020-11-01 MED ORDER — OXYCODONE-ACETAMINOPHEN 5-325 MG PO TABS: 1.0000 | ORAL_TABLET | ORAL | Status: DC | PRN

## 2020-11-01 MED ORDER — OXYTOCIN-SODIUM CHLORIDE 30-0.9 UT/500ML-% IV SOLN
2.5000 [IU]/h | INTRAVENOUS | Status: DC
  Administered 2020-11-02: 2.5 [IU]/h via INTRAVENOUS
  Filled 2020-11-01: qty 500

## 2020-11-01 MED ORDER — SOD CITRATE-CITRIC ACID 500-334 MG/5ML PO SOLN: 30.0000 mL | ORAL | Status: DC | PRN

## 2020-11-01 MED ORDER — ACETAMINOPHEN 325 MG PO TABS: 650.0000 mg | ORAL_TABLET | ORAL | Status: DC | PRN

## 2020-11-01 MED ORDER — FLEET ENEMA 7-19 GM/118ML RE ENEM: 1.0000 | ENEMA | RECTAL | Status: DC | PRN

## 2020-11-01 MED ORDER — OXYTOCIN BOLUS FROM INFUSION
333.0000 mL | Freq: Once | INTRAVENOUS | Status: AC
Start: 2020-11-01 — End: 2020-11-02
  Administered 2020-11-02: 333 mL via INTRAVENOUS

## 2020-11-01 MED ORDER — LEVONORGESTREL 20.1 MCG/DAY IU IUD: 1.0000 | INTRAUTERINE_SYSTEM | Freq: Once | INTRAUTERINE | Status: DC

## 2020-11-01 MED ORDER — LACTATED RINGERS IV SOLN
INTRAVENOUS | Status: DC
Start: 2020-11-01 — End: 2020-11-03

## 2020-11-01 MED ORDER — OXYTOCIN-SODIUM CHLORIDE 30-0.9 UT/500ML-% IV SOLN
1.0000 m[IU]/min | INTRAVENOUS | Status: DC
  Administered 2020-11-01: 2 m[IU]/min via INTRAVENOUS
  Filled 2020-11-01: qty 500

## 2020-11-01 NOTE — Procedures (Signed)
Cheryl Cohen 1990/05/21 [redacted]w[redacted]d  Fetus A Non-Stress Test Interpretation for 11/01/20  Indication:  GDM-diet  Fetal Heart Rate A Mode: External Baseline Rate (A): 150 bpm Variability: Moderate Accelerations: 10 x 10 Decelerations: Variable Multiple birth?: No  Uterine Activity Mode: Toco Contraction Frequency (min): UI Contraction Quality: Mild Resting Tone Palpated: Relaxed Resting Time: Adequate  Interpretation (Fetal Testing) Nonstress Test Interpretation: Non-reactive Comments: Dr. Parke Poisson reviewed tracing and discussed with patient. Patient to be admitted today for delivery. Dr. Parke Poisson spoke with Dr. Crissie Reese.

## 2020-11-01 NOTE — Progress Notes (Signed)
MFM Note  Cheryl Cohen was seen today due to diet-controlled gestational diabetes and prior poor obstetrical history of twin fetal demise at 25+ weeks.  She denies any problems since her last exam and reports feeling fetal movements throughout the day.  She reports that her cervix was already 2 cm dilated at her last prenatal visit.  A limited ultrasound performed today shows that the fetus is in the vertex presentation.    There was normal amniotic fluid noted.   The patient had a nonstress test today that was nonreactive.  Two mild variable decelerations were also noted.  Due to her prior history of a fetal demise and the nonreactive NST today, delivery is recommended.    The patient was sent to labor and delivery for induction immediately following today's exam.  The patient is happy and agreeable with delivery today.    A total of 15 minutes was spent counseling and coordinating the care for this patient.  Greater than 50% of the time was spent in direct face-to-face contact.

## 2020-11-01 NOTE — H&P (Addendum)
OBSTETRIC ADMISSION HISTORY AND PHYSICAL  Cheryl Cohen is a 30 y.o. female G1P0110 with IUP at [redacted]w[redacted]d by LMP presenting for IOL due to non-reactive strip. She reports +FMs, no LOF, no VB, no blurry vision, headaches or peripheral edema, or RUQ pain.  She plans on breast feeding. She requested post-placental IUD for birth control. Consented for IUD.  She [facility]    Dating: By LMP --->  Estimated Date of Delivery: 11/11/20  Sono:   '@[redacted]w[redacted]d'$ , CWD, normal anatomy, cephalic presentation, placenta posterior, 2708g, 42% EFW  Prenatal History/Complications:  - Gestational diabetes, diet controlled - History of twin IUFD, 22+ weeks - Bipolar 1 disorder, Lithium discontinued in first trimester, currently on Olanzapine - Anxiety - Depression  Past Medical History: Past Medical History:  Diagnosis Date   Anxiety    Bipolar 1 disorder ([place])    hospitalized  3 x onset rx in 9th grade    Depression    Gestational diabetes    Gonorrhea    Hx of varicella    Syphilis    Twin pregnancy, delivered vaginally, IUFD stillborns 09/09/2015   IUFD  21 + weeks  still borns     Past Surgical History: Past Surgical History:  Procedure Laterality Date   DILATION AND EVACUATION N/A 09/09/2015   Procedure: DILATATION AND EVACUATION;  Surgeon: Cheri Fowler, MD;  Location: [facility];  Service: Gynecology;  Laterality: N/A;   TYMPANOSTOMY TUBE PLACEMENT     asage 2-3 yrs    Obstetrical History: OB History     Gravida  3   Para  1   Term      Preterm  1   AB  1   Living  0      SAB      IAB  1   Ectopic      Multiple  1   Live Births  0           Social History Social History   Socioeconomic History   Marital status: Single    Spouse name: Not on file   Number of children: Not on file   Years of education: Not on file   Highest education level: Some college, no degree  Occupational History    Comment: Museum/gallery conservator on  [employer]  Tobacco Use   Smoking status: Former    Packs/day: 0.20    Types: Cigarettes   Smokeless tobacco: Never   Tobacco comments:    1 cigarette a day  Vaping Use   Vaping Use: Never used  Substance and Sexual Activity   Alcohol use: No   Drug use: No   Sexual activity: Never  Other Topics Concern   Not on file  Social History Narrative   hh of 4    Lives at home  [place] to [place] for [place]  In past    Worked also [employer]   Pet dog   Neg tad some caffiene       Now living at home  Had been with vine   Social Determinants of Radio broadcast assistant Strain: Not on file  Food Insecurity: No Food Insecurity   Worried About Charity fundraiser in the Last Year: Never true   Ran Out of Food in the Last Year: Never true  Transportation Needs: Not on file  Physical Activity: Not on file  Stress: Not on file  Social Connections: Not on  file    Family History: Family History  Problem Relation Age of Onset   Diabetes Mother    Arthritis Mother    Hypertension Father    Alcohol abuse Father    Alcoholism Father     Allergies: Allergies  Allergen Reactions   Amoxicillin Rash and Other (See Comments)    Has patient had a PCN reaction causing immediate rash, facial/tongue/throat swelling, SOB or lightheadedness with hypotension: yes Has patient had a PCN reaction causing severe rash involving mucus membranes or skin necrosis: No Has patient had a PCN reaction that required hospitalization: No Has patient had a PCN reaction occurring within the last 10 years: Yes If all of the above answers are "NO", then may proceed with Cephalosporin use.     Medications Prior to Admission  Medication Sig Dispense Refill Last Dose   Blood Glucose Monitoring Suppl (ACCU-CHEK GUIDE) w/Device KIT 1 Device by Does not apply route 4 (four) times daily. 1 kit 0 10/31/2020   glucose blood (ACCU-CHEK GUIDE) test strip Use to check blood sugars four times a day was instructed  50 each 12 10/31/2020   OLANZapine (ZYPREXA) 15 MG tablet Take 15 mg by mouth at bedtime.   10/31/2020   Prenatal Vit-Fe Fumarate-FA (PRENATAL MULTIVITAMIN) TABS tablet Take 1 tablet by mouth daily at 12 noon.   10/31/2020   Accu-Chek Softclix Lancets lancets 1 each by Other route 4 (four) times daily. 100 each 12    terconazole (TERAZOL 7) 0.4 % vaginal cream Place 1 applicator vaginally at bedtime. 45 g 0      Review of Systems   All systems reviewed and negative except as stated in HPI  Blood pressure 114/81, pulse (!) 108, temperature 98.4 F (36.9 C), temperature source Axillary, resp. rate 16, height $RemoveBe'5\' 7"'dnsSUvaaV$  (1.702 m), weight 92 kg, last menstrual period 02/05/2020.  General appearance: alert and no distress Lungs: clear to auscultation bilaterally Heart: regular rate and rhythm Abdomen: soft, non-tender; bowel sounds normal Pelvic: cervix dilated 1cm, soft, 50% effacement Extremities: Homans sign is negative, no sign of DVT Presentation: cephalic Fetal monitoring: Baseline: 135 bpm, Variability: Good {> 6 bpm), Accelerations: Reactive, and Decelerations: Absent Uterine activityNone Dilation: 1 Effacement (%): 50 Station: -2 Exam by:: Dr. Charleston Ropes   Prenatal labs: ABO, Rh: --/--/PENDING (08/01 1320) Antibody: PENDING (08/01 1320) Rubella:   RPR: Non Reactive (05/20 1129)  HBsAg: Negative (01/12 0000)  HIV: Non Reactive (05/20 1129)  GBS: Negative/-- (07/19 0422)  1 hr Glucola 134 Genetic screening declined Anatomy US normal  Prenatal Transfer Tool  Maternal Diabetes: Yes:  Diabetes Type:  Diet controlled Genetic Screening: Declined Maternal Ultrasounds/Referrals: Normal Fetal Ultrasounds or other Referrals:  Fetal echo Maternal Substance Abuse:  No Significant Maternal Medications:  Meds include: Other: Lithium in first trimester, currently on Zyprexa $RemoveBe'15mg'MqAfSbkWT$  daily Significant Maternal Lab Results: Group B Strep negative  Results for orders placed or performed during  the hospital encounter of 11/01/20 (from the past 24 hour(s))  Type and screen [facility]   Collection Time: 11/01/20  1:20 PM  Result Value Ref Range   ABO/RH(D) PENDING    Antibody Screen PENDING    Sample Expiration      11/04/2020,2359 Performed at [facility], [place] [address], [place], [place] [address]   Resp Panel by RT-PCR (Flu A&B, Covid) Nasopharyngeal Swab   Collection Time: 11/01/20  1:53 PM   Specimen: Nasopharyngeal Swab; Nasopharyngeal(NP) swabs in vial transport medium  Result Value Ref  Range   SARS Coronavirus 2 by RT PCR NEGATIVE NEGATIVE   Influenza A by PCR NEGATIVE NEGATIVE   Influenza B by PCR NEGATIVE NEGATIVE  CBC   Collection Time: 11/01/20  1:53 PM  Result Value Ref Range   WBC 13.3 (H) 4.0 - 10.5 K/uL   RBC 4.13 3.87 - 5.11 MIL/uL   Hemoglobin 12.9 12.0 - 15.0 g/dL   HCT 38.2 36.0 - 46.0 %   MCV 92.5 80.0 - 100.0 fL   MCH 31.2 26.0 - 34.0 pg   MCHC 33.8 30.0 - 36.0 g/dL   RDW 13.6 11.5 - 15.5 %   Platelets 235 150 - 400 K/uL   nRBC 0.0 0.0 - 0.2 %  Glucose, capillary   Collection Time: 11/01/20  2:24 PM  Result Value Ref Range   Glucose-Capillary 90 70 - 99 mg/dL    Patient Active Problem List   Diagnosis Date Noted   Pregnancy 11/01/2020   Gestational diabetes mellitus (GDM), antepartum 08/25/2020   Supervision of normal pregnancy, antepartum 06/25/2020   Psychotic disorder with delusions ([place])    Bipolar affective disorder, mixed, severe ([place]) 10/04/2016   Fetal demise, greater than 22 weeks, antepartum 09/08/2015   Suicidal ideation 10/06/2013   Family history of diabetes mellitus type II 01/11/2012    Assessment/Plan:  Kisha Danille Oppedisano is a 30 y.o. G3P0110 at [redacted]w[redacted]d with history of twin IUFD here for IOL due to non-reactive strip with decreased fetal movement.   #Labor: Foley bulb placed 1437. If contractions do not increase, consider Pitocin. #Pain: PRN #FWB: Category I strip #ID:  GBS negative #MOF:  Breastfeeding #MOC: Post-placental IUD, consented #Circ:  Yes #Bipolar disorder/depression: Continue Zyprexa $RemoveBeforeDE'15mg'[id number]$ , 1 week postpartum mood check #Gestational diabetes: Diet controlled, CBG q4h  Nelva Nay, Medical Student  11/01/2020, 4:18 PM   GME ATTESTATION:  I saw and evaluated the patient. I agree with the findings and the plan of care as documented in the student and resident's note.  Vilma Meckel, MD OB Fellow, [place] for [place] 11/01/2020 4:43 PM

## 2020-11-01 NOTE — Telephone Encounter (Signed)
Preadmission screen  

## 2020-11-01 NOTE — Progress Notes (Signed)
Labor Progress Note Cheryl Cohen is a 30 y.o. G3P0110 at [redacted]w[redacted]d presented for IOL-nonreactive NST/DFM w/ Hx of IUFD.  S: Patient is resting comfortably with family at bedside.  O:  BP 117/68   Pulse 73   Temp 98.4 F (36.9 C) (Axillary)   Resp 16   Ht 5\' 7"  (1.702 m)   Wt 92 kg   LMP 02/05/2020   BMI 31.76 kg/m  EFM: baseline 135 BPM/+accels/-decels/mod variability Toco: ~q3-18min contractions   CVE: Dilation: 1 Effacement (%): 50 Station: -2 Presentation: Vertex Exam by:: Dr. [email or web address]   A&P: 30 y.o. 26 [redacted]w[redacted]d presenting for IOL-nonreactive NST/DFM w/ Hx of IUFD.  #Labor: FB placed@1435 -still in place. Pit started@1737 -running @6ml /hr. Await for FB to dislodge.  #Pain: PRN #FWB: cat 1 #GBS negative #GDMA1: Last CBGs-90>99. q4CBG. Continue monitoring  #Bipolar 1 disorder: On zyprexa home dose    [redacted]w[redacted]d, MD Center for , [facility] 9:10 PM

## 2020-11-02 ENCOUNTER — Encounter: Payer: 59 | Admitting: Obstetrics

## 2020-11-02 ENCOUNTER — Inpatient Hospital Stay (HOSPITAL_COMMUNITY): Payer: 59 | Admitting: Anesthesiology

## 2020-11-02 ENCOUNTER — Encounter (HOSPITAL_COMMUNITY): Payer: Self-pay | Admitting: Family Medicine

## 2020-11-02 DIAGNOSIS — O24420 Gestational diabetes mellitus in childbirth, diet controlled: Secondary | ICD-10-CM

## 2020-11-02 DIAGNOSIS — O368330 Maternal care for abnormalities of the fetal heart rate or rhythm, third trimester, not applicable or unspecified: Secondary | ICD-10-CM

## 2020-11-02 DIAGNOSIS — O09292 Supervision of pregnancy with other poor reproductive or obstetric history, second trimester: Secondary | ICD-10-CM

## 2020-11-02 DIAGNOSIS — O368130 Decreased fetal movements, third trimester, not applicable or unspecified: Secondary | ICD-10-CM

## 2020-11-02 DIAGNOSIS — Z3A38 38 weeks gestation of pregnancy: Secondary | ICD-10-CM

## 2020-11-02 LAB — GLUCOSE, CAPILLARY
Glucose-Capillary: 85 mg/dL (ref 70–99)
Glucose-Capillary: 108 mg/dL — ABNORMAL HIGH (ref 70–99)
Glucose-Capillary: 88 mg/dL (ref 70–99)
Glucose-Capillary: 87 mg/dL (ref 70–99)
Glucose-Capillary: 79 mg/dL (ref 70–99)

## 2020-11-02 LAB — RPR: RPR Ser Ql: NONREACTIVE

## 2020-11-02 MED ORDER — EPHEDRINE 5 MG/ML INJ
10.0000 mg | INTRAVENOUS | Status: DC | PRN
Start: 2020-11-02 — End: 2020-11-03

## 2020-11-02 MED ORDER — LIDOCAINE-EPINEPHRINE (PF) 1.5 %-1:200000 IJ SOLN
INTRAMUSCULAR | Status: DC | PRN
  Administered 2020-11-02: 5 mL via EPIDURAL

## 2020-11-02 MED ORDER — PHENYLEPHRINE 40 MCG/ML (10ML) SYRINGE FOR IV PUSH (FOR BLOOD PRESSURE SUPPORT)
80.0000 ug | PREFILLED_SYRINGE | INTRAVENOUS | Status: DC | PRN
  Filled 2020-11-02: qty 10

## 2020-11-02 MED ORDER — PHENYLEPHRINE 40 MCG/ML (10ML) SYRINGE FOR IV PUSH (FOR BLOOD PRESSURE SUPPORT): 80.0000 ug | PREFILLED_SYRINGE | INTRAVENOUS | Status: DC | PRN

## 2020-11-02 MED ORDER — EPHEDRINE 5 MG/ML INJ: 10.0000 mg | INTRAVENOUS | Status: DC | PRN

## 2020-11-02 MED ORDER — LACTATED RINGERS AMNIOINFUSION: INTRAVENOUS | Status: DC

## 2020-11-02 MED ORDER — LACTATED RINGERS IV SOLN: 500.0000 mL | Freq: Once | INTRAVENOUS | Status: DC

## 2020-11-02 MED ORDER — FENTANYL-BUPIVACAINE-NACL 0.5-0.125-0.9 MG/250ML-% EP SOLN
12.0000 mL/h | EPIDURAL | Status: DC | PRN
  Administered 2020-11-02: 12 mL/h via EPIDURAL
  Filled 2020-11-02: qty 250

## 2020-11-02 MED ORDER — DIPHENHYDRAMINE HCL 50 MG/ML IJ SOLN
12.5000 mg | INTRAMUSCULAR | Status: DC | PRN
Start: 2020-11-02 — End: 2020-11-03

## 2020-11-02 NOTE — Progress Notes (Signed)
Labor Progress Note Cheryl Cohen is a 30 y.o. G3P0110 at [redacted]w[redacted]d presented for IOL-nonreactive NST/DFM w/ Hx of IUFD.  S: Patient is resting comfortably in high fowlers with family at bedside.  O:  BP 100/61   Pulse 84   Temp 98.1 F (36.7 C) (Axillary)   Resp 16   Ht 5\' 7"  (1.702 m)   Wt 92 kg   LMP 02/05/2020   SpO2 96%   BMI 31.76 kg/m  EFM: baseline 120 BPM/+accels/-decels/mod variability Toco: ~q3-51min contractions   CVE: Dilation: 6 Effacement (%): 80 Station: 0, Plus 1 Presentation: Vertex Exam by:: [email or web address], RN   A&P: 30 y.o. 26 [redacted]w[redacted]d presenting for IOL-nonreactive NST/DFM w/ Hx of IUFD.  #Labor: S/p FB and AROM. Had amnioinfusion for variables on last check. Pit started@1737 -running @22ml /hr. Pt is slowly making change and is comfortable. Contractions are adequate via IUPC.  #Pain: PRN #FWB: cat 1 #GBS negative #GDMA1: Last CBGs-87>79. q4CBG. Continue monitoring  #Bipolar 1 disorder: On zyprexa home dose    [redacted]w[redacted]d, MD Center for , [facility] 9:30 PM

## 2020-11-02 NOTE — Progress Notes (Signed)
Labor Progress Note Cheryl Cohen is a 30 y.o. G3P0110 at [redacted]w[redacted]d presented for IOL-nonreactive NST/DFM w/ Hx of IUFD.  S: Patient is resting comfortably with family at bedside.  O:  BP 109/66   Pulse 89   Temp 98 F (36.7 C) (Oral)   Resp 16   Ht 5\' 7"  (1.702 m)   Wt 92 kg   LMP 02/05/2020   BMI 31.76 kg/m  EFM: baseline 135 BPM/+accels/-decels/mod variability Toco: ~q3-84min contractions   CVE: Dilation: 5.5 Effacement (%): 70 Station: -2 Presentation: Vertex Exam by:: Dr [email or web address]   A&P: 30 y.o. 26 [redacted]w[redacted]d presenting for IOL-nonreactive NST/DFM w/ Hx of IUFD.  #Labor: FB dislodged. Pit started@1737 -running @10ml /hr. Continue to titrate may AROM if needed at next check.  #Pain: PRN #FWB: cat 1 #GBS negative #GDMA1: Last CBGs-85>87. q4CBG. Continue monitoring  #Bipolar 1 disorder: On zyprexa home dose    [redacted]w[redacted]d, MD Center for , [facility] 3:25 AM

## 2020-11-02 NOTE — Progress Notes (Signed)
Patient ID: Cheryl Cohen, female   DOB: [birth year], 30 y.o.   MRN: [id number]  Still comfortable w epidural  BP 106/65, P 78 FHR 120s, +accels, occ variables Ctx q 2-3 mins w Pit at 35mu/min Cx 4+/80/vtx 0 (shrunk back after AROM of BBOW)  IUP@38 .5wks GDMA1 IOL process  -Amnioinfusion for FHR variables -Plan to titrate Pit to achieve adequate MVUs, and then plan for cx exam after 2hrs of adequate ctx  Arabella Merles [facility] 11/02/2020

## 2020-11-02 NOTE — Anesthesia Procedure Notes (Signed)
Epidural Patient location during procedure: OB Start time: 11/02/2020 9:33 AM End time: 11/02/2020 9:40 AM  Staffing Anesthesiologist: Atilano Median, DO Performed: anesthesiologist   Preanesthetic Checklist Completed: patient identified, IV checked, site marked, risks and benefits discussed, surgical consent, monitors and equipment checked, pre-op evaluation and timeout performed  Epidural Patient position: sitting Prep: ChloraPrep Patient monitoring: heart rate, continuous pulse ox and blood pressure Approach: midline Location: L3-L4 Injection technique: LOR saline  Needle:  Needle type: Tuohy  Needle gauge: 17 G Needle length: 9 cm Needle insertion depth: 8 cm Catheter type: closed end flexible Catheter size: 20 Guage Catheter at skin depth: 12 cm Test dose: negative and 1.5% lidocaine  Assessment Events: blood not aspirated, injection not painful, no injection resistance and no paresthesia  Additional Notes Patient identified. Risks/Benefits/Options discussed with patient including but not limited to bleeding, infection, nerve damage, paralysis, failed block, incomplete pain control, headache, blood pressure changes, nausea, vomiting, reactions to medications, itching and postpartum back pain. Confirmed with bedside nurse the patient's most recent platelet count. Confirmed with patient that they are not currently taking any anticoagulation, have any bleeding history or any family history of bleeding disorders. Patient expressed understanding and wished to proceed. All questions were answered. Sterile technique was used throughout the entire procedure. Please see nursing notes for vital signs. Test dose was given through epidural catheter and negative prior to continuing to dose epidural or start infusion. Warning signs of high block given to the patient including shortness of breath, tingling/numbness in hands, complete motor block, or any concerning symptoms with instructions  to call for help. Patient was given instructions on fall risk and not to get out of bed. All questions and concerns addressed with instructions to call with any issues or inadequate analgesia.    Reason for block:procedure for pain

## 2020-11-02 NOTE — Progress Notes (Signed)
Patient ID: Cheryl Cohen, female   DOB: 11-Oct-1990, 30 y.o.   MRN: [id number]  Sitting up for epidural  BP 116/59, P 94 FHR 140, +accels, no decels Ctx q 2-3 mins with Pit at 28mu/min Cx deferred presently (5+/70/-2)  CBG: 108  IUP@38 .5wks GDMA1 Early active labor  Once comfortable w epidural, plan cx check for progression Anticipate vag del  Arabella Merles [facility] 11/02/2020 9:52 AM

## 2020-11-02 NOTE — Progress Notes (Signed)
Patient ID: Cheryl Cohen, female   DOB: 1991/03/01, 30 y.o.   MRN: [id number]  Comfortable w epidural  FHR 130s, +accels, no decels Ctx q 2-3 mins with Pit at 70mu/min Cx 6/80/vtx -1; AROM for clear fluid  CBG: 88  IUP@38 .5wks GDMA1 Early active labor  Hopeful that with AROM/Pit, labor will progress Anticipate vag del  Arabella Merles CNM 11/02/2020

## 2020-11-02 NOTE — Anesthesia Preprocedure Evaluation (Signed)
Anesthesia Evaluation  Patient identified by MRN, date of birth, ID band Patient awake    Reviewed: Allergy & Precautions, NPO status , Patient's Chart, lab work & pertinent test results  Airway Mallampati: II  TM Distance: >3 FB Neck ROM: Full    Dental  (+) Teeth Intact   Pulmonary neg pulmonary ROS, former smoker,    Pulmonary exam normal        Cardiovascular negative cardio ROS   Rhythm:Regular Rate:Normal     Neuro/Psych Anxiety Depression Bipolar Disorder negative neurological ROS     GI/Hepatic negative GI ROS, Neg liver ROS,   Endo/Other  diabetes, Gestational  Renal/GU negative Renal ROS  negative genitourinary   Musculoskeletal negative musculoskeletal ROS (+)   Abdominal (+)  Abdomen: soft. Bowel sounds: normal.  Peds  Hematology negative hematology ROS (+)   Anesthesia Other Findings   Reproductive/Obstetrics (+) Pregnancy                             Anesthesia Physical Anesthesia Plan  ASA: 2  Anesthesia Plan: Epidural   Post-op Pain Management:    Induction:   PONV Risk Score and Plan: 2 and Treatment may vary due to age or medical condition  Airway Management Planned: Natural Airway  Additional Equipment: None  Intra-op Plan:   Post-operative Plan:   Informed Consent: I have reviewed the patients History and Physical, chart, labs and discussed the procedure including the risks, benefits and alternatives for the proposed anesthesia with the patient or authorized representative who has indicated his/her understanding and acceptance.     Dental advisory given  Plan Discussed with:   Anesthesia Plan Comments: (Lab Results      Component                Value               Date                      WBC                      13.3 (H)            11/01/2020                HGB                      12.9                11/01/2020                HCT                       38.2                11/01/2020                MCV                      92.5                11/01/2020                PLT                      235  11/01/2020          )        Anesthesia Quick Evaluation

## 2020-11-02 NOTE — Lactation Note (Signed)
This note was copied from a baby's chart. Lactation Consultation Note  Patient Name: Cheryl Cohen HMCNO'B Date: 11/02/2020 Reason for consult: L&D Initial assessment;Mother's request;Primapara;1st time breastfeeding;Early term 37-38.6wks;Maternal endocrine disorder (Zyprexa L2 [place] book) Age:29 hours  Infant latched with signs of milk transfer. Mom to feed by cues 8-12x in 24 hr period no more than 4 hrs without an attempt.  Mom to receive further LC support on the floor.   Maternal Data Has patient been taught Hand Expression?: Yes Does the patient have breastfeeding experience prior to this delivery?: No  Feeding Mother's Current Feeding Choice: Breast Milk  LATCH Score Latch: Repeated attempts needed to sustain latch, nipple held in mouth throughout feeding, stimulation needed to elicit sucking reflex.  Audible Swallowing: Spontaneous and intermittent  Type of Nipple: Flat (will evert with stimulation)  Comfort (Breast/Nipple): Soft / non-tender  Hold (Positioning): Assistance needed to correctly position infant at breast and maintain latch.  LATCH Score: 7   Lactation Tools Discussed/Used    Interventions Interventions: Breast feeding basics reviewed;Breast compression;Assisted with latch;Adjust position;Skin to skin;Support pillows;Breast massage;Hand express;Expressed milk;Education  Discharge    Consult Status Consult Status: Follow-up Date: 11/03/20 Follow-up type: In-patient    Jessilyn Catino  [place] 11/02/2020, 11:07 PM

## 2020-11-03 ENCOUNTER — Encounter (HOSPITAL_COMMUNITY): Payer: Self-pay | Admitting: Family Medicine

## 2020-11-03 LAB — GLUCOSE, CAPILLARY: Glucose-Capillary: 81 mg/dL (ref 70–99)

## 2020-11-03 MED ORDER — DIPHENHYDRAMINE HCL 25 MG PO CAPS
25.0000 mg | ORAL_CAPSULE | Freq: Four times a day (QID) | ORAL | Status: DC | PRN
Start: 2020-11-03 — End: 2020-11-04

## 2020-11-03 MED ORDER — COCONUT OIL OIL: 1.0000 "application " | TOPICAL_OIL | Status: DC | PRN

## 2020-11-03 MED ORDER — PRENATAL MULTIVITAMIN CH
1.0000 | ORAL_TABLET | Freq: Every day | ORAL | Status: DC
  Administered 2020-11-03: 1 via ORAL
  Filled 2020-11-03: qty 1

## 2020-11-03 MED ORDER — SENNOSIDES-DOCUSATE SODIUM 8.6-50 MG PO TABS: 2.0000 | ORAL_TABLET | Freq: Every day | ORAL | Status: DC

## 2020-11-03 MED ORDER — SIMETHICONE 80 MG PO CHEW: 80.0000 mg | CHEWABLE_TABLET | ORAL | Status: DC | PRN

## 2020-11-03 MED ORDER — WITCH HAZEL-GLYCERIN EX PADS: 1.0000 "application " | MEDICATED_PAD | CUTANEOUS | Status: DC | PRN

## 2020-11-03 MED ORDER — DIBUCAINE (PERIANAL) 1 % EX OINT: 1.0000 "application " | TOPICAL_OINTMENT | CUTANEOUS | Status: DC | PRN

## 2020-11-03 MED ORDER — ZOLPIDEM TARTRATE 5 MG PO TABS: 5.0000 mg | ORAL_TABLET | Freq: Every evening | ORAL | Status: DC | PRN

## 2020-11-03 MED ORDER — ACETAMINOPHEN 325 MG PO TABS: 650.0000 mg | ORAL_TABLET | ORAL | Status: DC | PRN

## 2020-11-03 MED ORDER — ONDANSETRON HCL 4 MG PO TABS: 4.0000 mg | ORAL_TABLET | ORAL | Status: DC | PRN

## 2020-11-03 MED ORDER — ONDANSETRON HCL 4 MG/2ML IJ SOLN: 4.0000 mg | INTRAMUSCULAR | Status: DC | PRN

## 2020-11-03 MED ORDER — TETANUS-DIPHTH-ACELL PERTUSSIS 5-2.5-18.5 LF-MCG/0.5 IM SUSY: 0.5000 mL | PREFILLED_SYRINGE | Freq: Once | INTRAMUSCULAR | Status: DC

## 2020-11-03 MED ORDER — BENZOCAINE-MENTHOL 20-0.5 % EX AERO
1.0000 "application " | INHALATION_SPRAY | CUTANEOUS | Status: DC | PRN
  Filled 2020-11-03: qty 56

## 2020-11-03 MED ORDER — IBUPROFEN 600 MG PO TABS
600.0000 mg | ORAL_TABLET | Freq: Four times a day (QID) | ORAL | Status: DC
  Administered 2020-11-03 – 2020-11-04 (×5): 600 mg via ORAL
  Filled 2020-11-03 (×5): qty 1

## 2020-11-03 NOTE — Clinical Social Work Maternal (Signed)
CLINICAL SOCIAL WORK MATERNAL/CHILD NOTE  Patient Details  Name: Cheryl Cohen MRN: [id number] Date of Birth: 05/15/1990  Date:  03/14/2021  Clinical Social Worker Initiating Note:  Darra Lis, [place] Date/Time: Initiated:  11/03/20/0915     Child's Name:  Cheryl Cohen   Biological Parents:  Mother   Need for Interpreter:  None   Reason for Referral:  Behavioral Health Concerns   Address:  [place], [place] [address]    Phone number:  [phone] (home)     Additional phone number:   Household Members/Support Persons (HM/SP):       HM/SP Name Relationship DOB or Age  HM/SP -1        HM/SP -2        HM/SP -3        HM/SP -4        HM/SP -5        HM/SP -6        HM/SP -7        HM/SP -8          Natural Supports (not living in the home):      Professional Supports: Other (Comment) ([place])   Employment:     Type of Work: Daycare   Education:  [place] arranged:    Museum/gallery curator Resources:  Other (Comment) ([employer] [employer])   Other Resources:  Food Stamps  , [facility]   Cultural/Religious Considerations Which May Impact Care:    Strengths:  Ability to meet basic needs  , Home prepared for child  , Understanding of illness, Compliance with medical plan  , Psychotropic Medications   Psychotropic Medications:  Other meds (Olanzapine)      Pediatrician:       Pediatrician List:   [place]      Pediatrician Fax Number:    Risk Factors/Current Problems:  None   Cognitive State:  Alert  , Linear Thinking  , Insightful     Mood/Affect:  Bright  , Happy  , Calm  , Interested  , Comfortable     CSW Assessment: CSW consulted for history of ADHD, anxiety and bipolar. CSW met with MOB to complete assessment and offer resources. CSW introduced self and role. CSW observed infant sleeping in bassinet. MOB was polite,  smiling and welcoming to visit. CSW informed MOB of reason for consult and assessed current feelings. MOB reported she is currently doing well and had a really good pregnancy. CSW inquired on MOB social situation. 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 MOB reported she receives both [facility] and food stamp services. MOB has already established a [facility] appointment for infant. CSW inquired on MOB mental health history. MOB disclosed she has a diagnosis of Bipolar and anxiety. MOB stated she was diagnosed her freshman year of college, in 2012. MOB shared she has been on numerous medications throughout the years and she is currently taking Zyprexa 65m. MOB reported she was on Lithium prior to pregnancy and finds the medication to be extremely helpful. MOB is followed by [facility] however she would like to switch providers for insurance purposes. CSW provided MOB with a list of providers obtained from the [facility]. MOB stated she has been in therapy in the past, which was helpful at the time. CSW inquired  on MOB 's previous hospitalizations. MOB stated she was last hospitalized in 2018. MOB reported she has not had any mental health concerns since that time, stating the medication is great for managing the diagnosis. MOB identified her mother as her primary support. FOB is being determined. MOB denies any current SI, HI or DV. MOB reported she feels comfortable contacting a medical professional if mental health needs arise postpartum.   CSW provided education regarding the baby blues period versus PPD and provided resources. CSW provided the New Mom Checklist and encouraged MOB to self evaluate and contact a medical professional if symptoms are noted at any time. MOB was receptive to resources and expressed interest in continuing to engage in mental health services.   CSW provided review of Sudden Infant Death Syndrome (SIDS) precautions. MOB reported she has all infant  essentials including a bassinet and car seat. MOB is still in the process of choosing a pediatrician and denies any transportation barriers. MOB reported she has no additional needs at this time.  RN reported on concerns and that MOB has been appropriate.   CSW identifies no further need for intervention and no barriers to discharge at this time.   CSW Plan/Description:  No Further Intervention Required/No Barriers to Discharge, Sudden Infant Death Syndrome (SIDS) Education, Perinatal Mood and Anxiety Disorder (PMADs) Education, Other Patient/Family Education, Other Information/Referral to [employer], [place] 11/03/2020, 10:31 AM

## 2020-11-03 NOTE — Lactation Note (Signed)
This note was copied from a baby's chart. Lactation Consultation Note Mom resting but woke up when LC came into room. Mom stated baby BF at 0130. Asked mom to call for LC to come to next feeding.   Patient Name: Cheryl Cohen YOFVW'A Date: 11/03/2020   Age:29 hours  Maternal Data    Feeding    LATCH Score                    Lactation Tools Discussed/Used    Interventions    Discharge    Consult Status      [name] 11/03/2020, 3:10 AM

## 2020-11-03 NOTE — Anesthesia Postprocedure Evaluation (Signed)
Anesthesia Post Note  Patient: Cheryl Cohen  Procedure(s) Performed: AN AD HOC LABOR EPIDURAL     Patient location during evaluation: Mother Baby Anesthesia Type: Epidural Level of consciousness: awake and alert Pain management: pain level controlled Vital Signs Assessment: post-procedure vital signs reviewed and stable Respiratory status: spontaneous breathing, nonlabored ventilation and respiratory function stable Cardiovascular status: stable Postop Assessment: no headache, no backache and epidural receding Anesthetic complications: no   No notable events documented.  Last Vitals:  Vitals:   11/03/20 0225 11/03/20 0557  BP: 91/60 99/61  Pulse: 86 80  Resp: 18   Temp: 37.2 C 36.7 C  SpO2: 97% 97%    Last Pain:  Vitals:   11/03/20 0557  TempSrc: Oral  PainSc: 0-No pain   Pain Goal: Patients Stated Pain Goal: 7 (11/01/20 2054)                 Fanny Dance

## 2020-11-03 NOTE — Discharge Summary (Addendum)
Postpartum Discharge Summary     Patient Name: Cheryl Cohen DOB: October 06, 1990 MRN: [id number]  Date of admission: 11/01/2020 Delivery date:11/02/2020  Delivering provider: Renee Harder  Date of discharge: 11/04/2020  Admitting diagnosis: Pregnancy [Z34.90] Intrauterine pregnancy: [redacted]w[redacted]d    Secondary diagnosis:  Active Problems:   Fetal demise, greater than 22 weeks, antepartum   Gestational diabetes mellitus (GDM), antepartum  Additional problems: NRNST, decreased fetal movement   Discharge diagnosis: Term Pregnancy Delivered                                              Post partum procedures: N/A Augmentation: AROM, Pitocin, Cytotec, and IP Foley Complications: None  Hospital course: Induction of Labor With Vaginal Delivery   30y.o. yo [id number] admitted to the hospital 11/01/2020 for induction of labor.  Indication for induction:  NRNST and decreased fetal movement with history of IUFD .  Patient had an uncomplicated labor course as follows: Membrane Rupture Time/Date: 12:56 PM ,11/02/2020   Delivery Method:Vaginal, Spontaneous  Episiotomy: None  Lacerations:  Labial  Details of delivery can be found in separate delivery note.  Patient had a routine postpartum course. Her FBS on PPD#1 was 81. Patient is discharged home 11/04/20.  Newborn Data: Birth date:11/02/2020  Birth time:10:14 PM  Gender:Female  Living status:Living  Apgars:8 ,9  Weight:3314 g   Magnesium Sulfate received: No BMZ received: No Rhophylac:N/A MMR:N/A T-DaP:Given prenatally Flu: N/A Transfusion:No  Physical exam  Vitals:   11/03/20 1119 11/03/20 1407 11/03/20 2101 11/04/20 0530  BP: 107/66 106/68 117/76 97/68  Pulse: 84 73 85 (!) 56  Resp: _0 Temp: 97.8 F (36.6 C) 98.2 F (36.8 C) 97.7 F (36.5 C) 98.5 F (36.9 C)  TempSrc: Oral Oral Oral Oral  SpO2: 97% 98% 98% 96%  Weight:      Height:       General: alert, cooperative, and no distress Lochia:  appropriate Uterine Fundus: firm Incision: N/A DVT Evaluation: No evidence of DVT seen on physical exam. Labs: Lab Results  Component Value Date   WBC 13.3 (H) 11/01/2020   HGB 12.9 11/01/2020   HCT 38.2 11/01/2020   MCV 92.5 11/01/2020   PLT 235 11/01/2020   CMP Latest Ref Rng & Units 10/20/2016  Glucose 65 - 99 mg/dL 87  BUN 6 - 20 mg/dL 12  Creatinine 0.44 - 1.00 mg/dL 0.69  Sodium 135 - 145 mmol/L 138  Potassium 3.5 - 5.1 mmol/L 3.6  Chloride 101 - 111 mmol/L 101  CO2 22 - 32 mmol/L 29  Calcium 8.9 - 10.3 mg/dL 9.3  Total Protein 6.5 - 8.1 g/dL 7.0  Total Bilirubin 0.3 - 1.2 mg/dL 0.5  Alkaline Phos 38 - 126 U/L 49  AST 15 - 41 U/L 22  ALT 14 - 54 U/L 18   Edinburgh Score: Edinburgh Postnatal Depression Scale Screening Tool 11/04/2020  I have been able to laugh and see the funny side of things. 0  I have looked forward with enjoyment to things. 0  I have blamed myself unnecessarily when things went wrong. 1  I have been anxious or worried for no good reason. 0  I have felt scared or panicky for no good reason. 0  Things have been getting on top of me. 0  I have been so  unhappy that I have had difficulty sleeping. 0  I have felt sad or miserable. 0  I have been so unhappy that I have been crying. 0  The thought of harming myself has occurred to me. 0  Edinburgh Postnatal Depression Scale Total 1     After visit meds:  Allergies as of 11/04/2020       Reactions   Amoxicillin Rash, Other (See Comments)   Has patient had a PCN reaction causing immediate rash, facial/tongue/throat swelling, SOB or lightheadedness with hypotension: yes Has patient had a PCN reaction causing severe rash involving mucus membranes or skin necrosis: No Has patient had a PCN reaction that required hospitalization: No Has patient had a PCN reaction occurring within the last 10 years: Yes If all of the above answers are "NO", then may proceed with Cephalosporin use.        Medication List      STOP taking these medications    Accu-Chek Guide test strip Generic drug: glucose blood   Accu-Chek Guide w/Device Kit   Accu-Chek Softclix Lancets lancets   prenatal multivitamin Tabs tablet   terconazole 0.4 % vaginal cream Commonly known as: TERAZOL 7       TAKE these medications    acetaminophen 325 MG tablet Commonly known as: Tylenol Take 2 tablets (650 mg total) by mouth every 4 (four) hours as needed (for pain scale < 4).   benzocaine-Menthol 20-0.5 % Aero Commonly known as: DERMOPLAST Apply 1 application topically as needed for irritation (perineal discomfort).   coconut oil Oil Apply 1 application topically as needed.   ibuprofen 600 MG tablet Commonly known as: ADVIL Take 1 tablet (600 mg total) by mouth every 6 (six) hours.   OLANZapine 15 MG tablet Commonly known as: ZYPREXA Take 15 mg by mouth at bedtime.         Discharge home in stable condition Infant Feeding: Breast Infant Disposition:home with mother Discharge instruction: per After Visit Summary and Postpartum booklet. Activity: Advance as tolerated. Pelvic rest for 6 weeks.  Diet: routine diet Future Appointments: Future Appointments  Date Time Provider [place]  11/30/2020  1:10 PM Shelly Bombard, MD [place] None   Follow up Visit:   Message sent to [place] by Maryagnes Amos, CNM Please schedule this patient for a In person postpartum visit in 6 weeks with the following provider: Any provider. Additional Postpartum F/U: gluc check   Low risk pregnancy complicated by: GDM Delivery mode:  Vaginal, Spontaneous  Anticipated Birth Control:  IUD   11/04/2020 Erskine Emery, MD  CNM attestation I have seen and examined this patient and agree with above documentation in the resident's note.   Cheryl Cohen is a 30 y.o. [id number] s/p vag del.   Pain is well controlled.  Plan for birth control is IUD.  Method of Feeding: bottle She expressed a desire for her son to  be circumcised- she was consented and a note was placed in the infant's chart.  PE:  BP 97/68 (BP Location: Right Arm)   Pulse (!) 56   Temp 98.5 F (36.9 C) (Oral)   Resp 16   Ht _0  (1.702 m)   Wt 92 kg   LMP 02/05/2020   SpO2 96%   Breastfeeding Unknown   BMI 31.76 kg/m  Fundus firm  No results for input(s): HGB, HCT in the last 72 hours.   Plan: discharge today - postpartum care discussed - f/u clinic in 4-6 weeks for postpartum visit  with IUD placement - son to be circumcised prior to d/c  Myrtis Ser, CNM 10:27 PM 11/04/2020

## 2020-11-03 NOTE — Progress Notes (Signed)
POSTPARTUM PROGRESS NOTE  Subjective: Cheryl Cohen is a 30 y.o. [id number] s/p PPD#1 SVD at [redacted]w[redacted]d.  She reports she doing well. No acute events overnight. She denies any problems with ambulating, voiding or po intake. Denies nausea or vomiting. She has passed flatus. Pain is well controlled.  Lochia is appropriate.  Objective: Blood pressure 99/61, pulse 80, temperature 98 F (36.7 C), temperature source Oral, resp. rate 18, height 5\' 7"  (1.702 m), weight 92 kg, last menstrual period 02/05/2020, SpO2 97 %, unknown if currently breastfeeding.  Physical Exam:  General: alert, cooperative and no distress Chest: no respiratory distress Abdomen: soft, non-tender  Uterine Fundus: firm and at level of umbilicus Extremities: No calf swelling or tenderness  No edema  Recent Labs    11/01/20 1353  HGB 12.9  HCT 38.2    Assessment/Plan: Cheryl Cohen is a 30 y.o. 26 s/p PPD#1 SVD at [redacted]w[redacted]d  Routine Postpartum Care: Doing well, pain well-controlled.  -- Continue routine care, lactation support  -- Contraception: IUD OP -- Feeding: breast -- Mood check in 1 week --CBG have looked good, pt doing well overall  Dispo: Plan for discharge tomorrow.  [redacted]w[redacted]d, MD [facility] Practice, [facility] 11/03/2020 8:01 AM

## 2020-11-03 NOTE — Lactation Note (Addendum)
This note was copied from a baby's chart. Lactation Consultation Note  Patient Name: [name] [id number] Date: 11/03/2020 Reason for consult: Initial assessment Age:29 Hours  LC arrived to do visit. Mother feeding a bottle. Mother reports that her goal for feeding her infant was breast and bottle formula. She reports that she now wants to pump and bottle feed but she is still unsure. A harmony hand pump with a #21 flange was given to mother with assistance in using. Mother described slight stinging with pumping. Mother has a very small nipple.  Encouraged mother to hand express and to pump for 15 mins on each breast every 3 hours  with gently compression of handle.   [facility] Brochure  given and advised to page LC if needed. Mother will decide if she wants to pump with the DEBP.    Maternal Data Has patient been taught Hand Expression?: Yes  Feeding Mother's Current Feeding Choice: Breast Milk and Formula  LATCH Score                    Lactation Tools Discussed/Used Tools: Flanges Flange Size: 21 Breast pump type: Manual (obs drops)  Interventions    Discharge    Consult Status Consult Status: Follow-up Date: 11/03/20 Follow-up type: In-patient    Stevan Born [facility] 11/03/2020, 11:17 AM

## 2020-11-03 NOTE — Lactation Note (Signed)
This note was copied from a baby's chart. Lactation Consultation Note  Patient Name: Boy Santos Hardwick [id number] Date: 11/03/2020 Reason for consult: Mother's request Age:29 hours  LC in to room per mother's request. Mother states she is using formula because she is not getting any breast milk with hand expression. Mother reports pain when using hand pump.  Reviewed hand expression technique, colostrum is easily expressed. LC check and flanged size seems appropriate, colostrum is easily expressed with hand pump.  Mother explains she has tried latching infant but her nipples are too short. Offered to help with latch since mother's tissue is soft and pliable but infant just finished feeding ~35 mL of formula,  Encouraged to contact LC to assist with latch. Mother agrees to plan.   Maternal Data Has patient been taught Hand Expression?: Yes  Feeding Mother's Current Feeding Choice: Breast Milk and Formula Nipple Type: Extra Slow Flow  Lactation Tools Discussed/Used Tools: Pump;Flanges Flange Size: 21 Breast pump type: Manual Reason for Pumping: nipple eversion, supplementation Pumping frequency: prior to feedings  Interventions Interventions: Hand express;Hand pump;Breast massage;Expressed milk;Education;Breast feeding basics reviewed  Discharge Pump: Manual WIC Program: Yes  Consult Status Consult Status: Follow-up Date: 11/04/20 Follow-up type: In-patient    Jil Penland A Higuera Ancidey 11/03/2020, 11:04 PM

## 2020-11-04 ENCOUNTER — Inpatient Hospital Stay (HOSPITAL_COMMUNITY): Admission: AD | Admit: 2020-11-04 | Payer: 59 | Source: Home / Self Care | Admitting: Obstetrics & Gynecology

## 2020-11-04 ENCOUNTER — Inpatient Hospital Stay (HOSPITAL_COMMUNITY): Payer: 59

## 2020-11-04 MED ORDER — IBUPROFEN 600 MG PO TABS: 600.0000 mg | ORAL_TABLET | Freq: Four times a day (QID) | ORAL | 0 refills | Status: DC

## 2020-11-04 MED ORDER — BENZOCAINE-MENTHOL 20-0.5 % EX AERO: 1.0000 "application " | INHALATION_SPRAY | CUTANEOUS | Status: DC | PRN

## 2020-11-04 MED ORDER — COCONUT OIL OIL: 1.0000 "application " | TOPICAL_OIL | 0 refills | Status: DC | PRN

## 2020-11-04 MED ORDER — ACETAMINOPHEN 325 MG PO TABS: 650.0000 mg | ORAL_TABLET | ORAL | Status: DC | PRN

## 2020-11-04 NOTE — Lactation Note (Signed)
This note was copied from a baby's chart. Lactation Consultation Note  Patient Name: Boy Jonessa Triplett [id number] Date: 11/04/2020 Reason for consult: Follow-up assessment;1st time breastfeeding;Early term 37-38.6wks;Maternal endocrine disorder Age:29 hours  LC in to visit with P3 Mom of ET infant on day of discharge.  Mom has been exclusively bottle feeding baby formula per her choice.  Mom declined assistance from [facility] and states she is fine with currently formula feeding.  Engorgement treatment discussed.  Mom has a hand pump and will use it prn.    Mom aware of OP lactation support available to her and encouraged her to call prn.    Lactation Tools Discussed/Used Tools: Bottle;Pump Breast pump type: Manual  Interventions Interventions: Education  Discharge Discharge Education: Engorgement and breast care  Consult Status Consult Status: Complete Date: 11/04/20    Judee Clara 11/04/2020, 11:20 AM

## 2020-11-30 ENCOUNTER — Encounter: Payer: Self-pay | Admitting: Obstetrics

## 2020-11-30 ENCOUNTER — Other Ambulatory Visit: Payer: Self-pay

## 2020-11-30 ENCOUNTER — Ambulatory Visit (INDEPENDENT_AMBULATORY_CARE_PROVIDER_SITE_OTHER): Payer: 59 | Admitting: Obstetrics

## 2020-11-30 ENCOUNTER — Institutional Professional Consult (permissible substitution): Payer: 59 | Admitting: Licensed Clinical Social Worker

## 2020-11-30 DIAGNOSIS — Z3009 Encounter for other general counseling and advice on contraception: Secondary | ICD-10-CM | POA: Diagnosis not present

## 2020-11-30 LAB — POCT URINALYSIS DIPSTICK
Bilirubin, UA: NEGATIVE
Glucose, UA: NEGATIVE
Ketones, UA: NEGATIVE
Nitrite, UA: NEGATIVE
Protein, UA: POSITIVE — AB
Spec Grav, UA: 1.015 (ref 1.010–1.025)
Urobilinogen, UA: 0.2 E.U./dL
pH, UA: 6 (ref 5.0–8.0)

## 2020-11-30 NOTE — Progress Notes (Signed)
Post Partum Visit Note  Cheryl Cohen is a 30 y.o. [phone] female who presents for a postpartum visit. She is 4 weeks postpartum following a normal spontaneous vaginal delivery.  I have fully reviewed the prenatal and intrapartum course. The delivery was at [redacted]w[redacted]d gestational weeks.  Anesthesia: epidural. Postpartum course has been unremarkable. Baby is doing well. Baby is feeding by bottle - Similac Advance. Bleeding no bleeding. Bowel function is normal. Bladder function is normal, however patient does complain of an odor to her urine today. Patient is sexually active. Contraception method is none. Postpartum depression screening: negative. EPDS: 2   The pregnancy intention screening data noted above was reviewed. Potential methods of contraception were discussed. The patient elected to proceed with No data recorded.   Edinburgh Postnatal Depression Scale - 11/30/20 1313       Edinburgh Postnatal Depression Scale:  In the Past 7 Days   I have been able to laugh and see the funny side of things. 0    I have looked forward with enjoyment to things. 0    I have blamed myself unnecessarily when things went wrong. 0    I have been anxious or worried for no good reason. 0    I have felt scared or panicky for no good reason. 0    Things have been getting on top of me. 2    I have been so unhappy that I have had difficulty sleeping. 0    I have felt sad or miserable. 0    I have been so unhappy that I have been crying. 0    The thought of harming myself has occurred to me. 0    Edinburgh Postnatal Depression Scale Total 2              The following portions of the patient's history were reviewed and updated as appropriate: allergies, current medications, past family history, past medical history, past social history, past surgical history, and problem list.  Review of Systems A comprehensive review of systems was negative.    Objective:  BP 107/71   Pulse 85   Ht 5\' 7"   (1.702 m)   Wt 178 lb (80.7 kg)   LMP 02/05/2020   Breastfeeding No   BMI 27.88 kg/m    General:  alert and no distress   Breasts:  inspection negative, no nipple discharge or bleeding, no masses or nodularity palpable  Lungs: clear to auscultation bilaterally  Heart:  regular rate and rhythm, S1, S2 normal, no murmur, click, rub or gallop  Abdomen: soft, non-tender; bowel sounds normal; no masses,  no organomegaly   Vulva:  not evaluated  Vagina: not evaluated  Cervix:  Not evaluated  Corpus: not examined  Adnexa:  not evaluated  Rectal Exam: Not performed.        Assessment:    Normal postpartum exam. Pap smear not done at today's visit.   Plan:   Essential components of care per ACOG recommendations:  1.  Mood and well being: Patient with negative depression screening today. Reviewed local resources for support.  - Patient does use tobacco. If using tobacco we discussed reduction and for recently cessation risk of relapse - hx of drug use? No    2. Infant care and feeding:  -Patient currently breastmilk feeding? No  -Social determinants of health (SDOH) reviewed in EPIC. No concerns  3. Sexuality, contraception and birth spacing - Patient does not want a pregnancy in the next year.  Desired family size is 2 children.  - Reviewed forms of contraception in tiered fashion. Patient desired no method today.   - Discussed birth spacing of 18 months  4. Sleep and fatigue -Encouraged family/partner/community support of 4 hrs of uninterrupted sleep to help with mood and fatigue  5. Physical Recovery  - Discussed patients delivery and there were no complication - Patient had a right labial laceration - Patient has urinary incontinence? No - Patient is safe to resume physical and sexual activity  6.  Health Maintenance - Last pap smear done 2020 and was normal with negative HPV.  7. No Chronic Disease - PCP follow up  Coral Ceo, MD [facility],  [facility]  11/30/20

## 2020-12-08 ENCOUNTER — Other Ambulatory Visit: Payer: Self-pay | Admitting: Obstetrics

## 2020-12-08 DIAGNOSIS — N3001 Acute cystitis with hematuria: Secondary | ICD-10-CM

## 2020-12-08 LAB — URINE CULTURE

## 2020-12-08 MED ORDER — NITROFURANTOIN MONOHYD MACRO 100 MG PO CAPS: 100.0000 mg | ORAL_CAPSULE | Freq: Two times a day (BID) | ORAL | 2 refills | Status: DC

## 2020-12-28 ENCOUNTER — Other Ambulatory Visit: Payer: Self-pay

## 2020-12-28 ENCOUNTER — Ambulatory Visit (INDEPENDENT_AMBULATORY_CARE_PROVIDER_SITE_OTHER): Payer: 59 | Admitting: Advanced Practice Midwife

## 2020-12-28 VITALS — BP 107/69 | HR 89 | Wt 181.0 lb

## 2020-12-28 DIAGNOSIS — Z3009 Encounter for other general counseling and advice on contraception: Secondary | ICD-10-CM | POA: Diagnosis not present

## 2020-12-28 NOTE — Progress Notes (Signed)
Pt has already had 4 wk PP exam on 11/30/20. Pt does not want contraception at this time.  Last pap: 2020 at another facility.  LMP: 12/09/20

## 2020-12-28 NOTE — Progress Notes (Signed)
   GYNECOLOGY PROGRESS NOTE  History:  30 y.o. [id number] presents to [facility] office today for follow up gyn visit. She had postpartum visit at 4 weeks postpartum on 11/30/20.  She continues to do well, denies any gyn concerns. She decided not to do an IUD for contraception and is using condoms currently.  She denies h/a, dizziness, shortness of breath, n/v, or fever/chills.    The following portions of the patient's history were reviewed and updated as appropriate: allergies, current medications, past family history, past medical history, past social history, past surgical history and problem list. Last pap smear in 2020 with [facility] was normal per pt.  Health Maintenance Due  Topic Date Due   URINE MICROALBUMIN  Never done   Hepatitis C Screening  Never done   COVID-19 Vaccine (3 - Pfizer risk series) 10/27/2019   INFLUENZA VACCINE  11/01/2020   PAP-Cervical Cytology Screening  02/25/2021   PAP SMEAR-Modifier  02/25/2021     Review of Systems:  Pertinent items are noted in HPI.   Objective:  Physical Exam Blood pressure 107/69, pulse 89, weight 181 lb (82.1 kg), last menstrual period 12/09/2020, not currently breastfeeding. VS reviewed, nursing note reviewed,  Constitutional: well developed, well nourished, no distress HEENT: normocephalic CV: normal rate Pulm/chest wall: normal effort Breast Exam: deferred Abdomen: soft Neuro: alert and oriented x 3 Skin: warm, dry Psych: affect normal Pelvic exam: Cervix pink, visually closed, without lesion, scant white creamy discharge, vaginal walls and external genitalia normal Bimanual exam: Cervix 0/long/high, firm, anterior, neg CMT, uterus nontender, nonenlarged, adnexa without tenderness, enlargement, or mass  Assessment & Plan:  1. Encounter for counseling regarding contraception --Discussed pt contraceptive plans and reviewed contraceptive methods based on pt preferences and effectiveness.  Pt prefers to continue using  condoms at this time.  Wants to delay pregnancy but would welcome a pregnancy if it occurred.  --F/U in 1 year for Pap, records requested from [facility] for Pap in 2020.  Sharen Counter, CNM 3:22 PM

## 2021-03-21 ENCOUNTER — Other Ambulatory Visit: Payer: Self-pay

## 2021-03-21 ENCOUNTER — Other Ambulatory Visit (HOSPITAL_COMMUNITY)
Admission: RE | Admit: 2021-03-21 | Discharge: 2021-03-21 | Disposition: A | Discharge status: Home / Self Care | Payer: 59 | Source: Ambulatory Visit | Attending: Obstetrics and Gynecology | Admitting: Obstetrics and Gynecology

## 2021-03-21 ENCOUNTER — Ambulatory Visit (INDEPENDENT_AMBULATORY_CARE_PROVIDER_SITE_OTHER): Payer: 59

## 2021-03-21 VITALS — BP 113/68 | HR 78 | Ht 67.0 in | Wt 177.0 lb

## 2021-03-21 DIAGNOSIS — N898 Other specified noninflammatory disorders of vagina: Secondary | ICD-10-CM | POA: Diagnosis not present

## 2021-03-21 NOTE — Progress Notes (Signed)
SUBJECTIVE:  30 y.o. female complains of clear vaginal discharge for 1 week(s). Denies abnormal vaginal bleeding or significant pelvic pain or fever. No UTI symptoms. Denies history of known exposure to STD.  Patient's last menstrual period was 02/24/2021 (exact date).  OBJECTIVE:  She appears well, afebrile. Urine dipstick: not done.  ASSESSMENT:  Vaginal Discharge  Vaginal Odor   PLAN:  GC, chlamydia, trichomonas, BVAG, CVAG probe sent to lab. Treatment: To be determined once lab results are received ROV prn if symptoms persist or worsen.

## 2021-03-23 ENCOUNTER — Other Ambulatory Visit: Payer: Self-pay

## 2021-03-23 DIAGNOSIS — B9689 Other specified bacterial agents as the cause of diseases classified elsewhere: Secondary | ICD-10-CM

## 2021-03-23 LAB — CERVICOVAGINAL ANCILLARY ONLY
Bacterial Vaginitis (gardnerella): POSITIVE — AB
Candida Glabrata: NEGATIVE
Candida Vaginitis: NEGATIVE
Chlamydia: NEGATIVE
Comment: NEGATIVE
Comment: NEGATIVE
Comment: NEGATIVE
Comment: NEGATIVE
Comment: NEGATIVE
Comment: NORMAL
Neisseria Gonorrhea: NEGATIVE
Trichomonas: NEGATIVE

## 2021-03-23 MED ORDER — METRONIDAZOLE 500 MG PO TABS: 500.0000 mg | ORAL_TABLET | Freq: Two times a day (BID) | ORAL | 0 refills | Status: DC

## 2021-03-31 ENCOUNTER — Other Ambulatory Visit: Payer: Self-pay

## 2021-03-31 ENCOUNTER — Ambulatory Visit (INDEPENDENT_AMBULATORY_CARE_PROVIDER_SITE_OTHER): Payer: 59

## 2021-03-31 DIAGNOSIS — N912 Amenorrhea, unspecified: Secondary | ICD-10-CM

## 2021-03-31 DIAGNOSIS — Z3480 Encounter for supervision of other normal pregnancy, unspecified trimester: Secondary | ICD-10-CM

## 2021-03-31 LAB — POCT URINE PREGNANCY: Preg Test, Ur: POSITIVE — AB

## 2021-03-31 MED ORDER — VITAFOL GUMMIES 3.33-0.333-34.8 MG PO CHEW
1.0000 | CHEWABLE_TABLET | Freq: Every day | ORAL | 5 refills | Status: DC
Start: 2021-03-31 — End: 2021-10-11

## 2021-03-31 NOTE — Progress Notes (Signed)
Cheryl Cohen presents today for UPT. She has no unusual complaints. LMP: 02/24/21    OBJECTIVE: Appears well, in no apparent distress.  OB History     Gravida  4   Para  2   Term  1   Preterm  1   AB  1   Living  1      SAB      IAB  1   Ectopic      Multiple  1   Live Births  1          Home UPT Result: positive  In-Office UPT result: positive  I have reviewed the patient's medical, obstetrical, social, and family histories, and medications.   ASSESSMENT: Positive pregnancy test  PLAN Prenatal care to be completed at:  [facility], sent to pharmacy

## 2021-03-31 NOTE — Progress Notes (Signed)
Patient was assessed and managed by nursing staff during this encounter. I have reviewed the chart and agree with the documentation and plan. I have also made any necessary editorial changes.  Warden Fillers, MD 03/31/2021 9:10 AM

## 2021-05-10 ENCOUNTER — Encounter: Payer: 59 | Admitting: Obstetrics

## 2021-06-21 ENCOUNTER — Encounter (HOSPITAL_COMMUNITY): Payer: Self-pay | Admitting: Emergency Medicine

## 2021-06-21 ENCOUNTER — Ambulatory Visit (HOSPITAL_COMMUNITY)
Admission: EM | Admit: 2021-06-21 | Discharge: 2021-06-21 | Disposition: A | Discharge status: Home / Self Care | Payer: Medicaid Other | Attending: Physician Assistant | Admitting: Physician Assistant

## 2021-06-21 DIAGNOSIS — J029 Acute pharyngitis, unspecified: Secondary | ICD-10-CM | POA: Diagnosis not present

## 2021-06-21 DIAGNOSIS — J020 Streptococcal pharyngitis: Secondary | ICD-10-CM

## 2021-06-21 LAB — POCT RAPID STREP A, ED / UC: Streptococcus, Group A Screen (Direct): POSITIVE — AB

## 2021-06-21 MED ORDER — AZITHROMYCIN 250 MG PO TABS: 250.0000 mg | ORAL_TABLET | Freq: Every day | ORAL | 0 refills | Status: DC

## 2021-06-21 NOTE — ED Provider Notes (Signed)
?[facility] ? ? ? ?CSN: [id number] ?Arrival date & time: 06/21/21  1235 ? ? ?  ? ?History   ?Chief Complaint ?Chief Complaint  ?Patient presents with  ? Sore Throat  ? Emesis  ? ? ?HPI ?Cheryl Cohen is a 31 y.o. female.  ? ?Patient presents today with a 2-day history of sore throat symptoms.  Reports associated body aches, fever, nausea/vomiting x1, headache, fatigue.  Denies any cough, congestion, chest pain, shortness of breath, abdominal pain, diarrhea.  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  She has been using ibuprofen with temporary relief of symptoms.  She denies any recent antibiotic use.  Reports pain is rated 4 on a 0-10 pain scale but increases to 6 with attempted swallowing, described as intense aching with periodic sharp pains, no alleviating factors identified.  She denies any muffled voice, difficulty swallowing, difficulty breathing, swelling of her throat.   ? ? ?Past Medical History:  ?Diagnosis Date  ? Anxiety   ? Bipolar 1 disorder (HCC)   ? hospitalized  3 x onset rx in 9th grade   ? Depression   ? Gestational diabetes   ? Gonorrhea   ? Hx of varicella   ? Syphilis   ? Twin pregnancy, delivered vaginally, IUFD stillborns 09/09/2015  ? IUFD  22 + weeks  still borns   ? ? ?Patient Active Problem List  ? Diagnosis Date Noted  ? Gestational diabetes mellitus (GDM), antepartum 08/25/2020  ? Psychotic disorder with delusions (HCC)   ? Bipolar affective disorder, mixed, severe (HCC) 10/04/2016  ? Fetal demise, greater than 22 weeks, antepartum 09/08/2015  ? Suicidal ideation 10/06/2013  ? Family history of diabetes mellitus type II 01/11/2012  ? ? ?Past Surgical History:  ?Procedure Laterality Date  ? DILATION AND EVACUATION N/A 09/09/2015  ? Procedure: DILATATION AND EVACUATION;  Surgeon: [name], MD;  Location: [facility];  Service: Gynecology;  Laterality: N/A;  ? TYMPANOSTOMY TUBE PLACEMENT    ? asage 2-3 yrs  ? ? ?OB History   ? ?  Gravida  ?4  ? Para  ?2  ? Term  ?1  ? Preterm  ?1  ? AB  ?1  ? Living  ?1  ?  ? ? SAB  ?   ? IAB  ?1  ? Ectopic  ?   ? Multiple  ?1  ? Live Births  ?1  ?   ?  ?  ? ? ? ?Home Medications   ? ?Prior to Admission medications   ?Medication Sig Start Date End Date Taking? Authorizing Provider  ?azithromycin (ZITHROMAX) 250 MG tablet Take 1 tablet (250 mg total) by mouth daily. Take first 2 tablets together, then 1 every day until finished. 06/21/21  Yes Sears Oran, Noberto Retort, PA-C  ?OLANZapine (ZYPREXA) 15 MG tablet Take 15 mg by mouth at bedtime.   Yes [provider]  ?lithium carbonate (ESKALITH) 450 MG CR tablet Take 450 mg by mouth daily. ?Patient not taking: Reported on 03/21/2021    [provider]  ?Prenatal Vit-Fe Phos-FA-Omega (VITAFOL GUMMIES) 3.33-0.333-34.8 MG CHEW Chew 1 tablet by mouth daily. 03/31/21   Warden Fillers, MD  ? ? ?Family History ?Family History  ?Problem Relation Age of Onset  ? Diabetes Mother   ? Arthritis Mother   ? Hypertension Father   ? Alcohol abuse Father   ? Alcoholism Father   ? ? ?Social History ?Social History  ? ?Tobacco Use  ?  Smoking status: Former  ?  Packs/day: 0.20  ?  Types: Cigarettes  ? Smokeless tobacco: Never  ? Tobacco comments:  ?  1 cigarette a day  ?Vaping Use  ? Vaping Use: Never used  ?Substance Use Topics  ? Alcohol use: No  ? Drug use: No  ? ? ? ?Allergies   ?Amoxicillin ? ? ?Review of Systems ?Review of Systems  ?Constitutional:  Positive for activity change, fatigue and fever. Negative for appetite change.  ?HENT:  Positive for sore throat. Negative for congestion, sinus pressure and sneezing.   ?Respiratory:  Negative for cough and shortness of breath.   ?Cardiovascular:  Negative for chest pain.  ?Gastrointestinal:  Positive for nausea and vomiting. Negative for abdominal pain and diarrhea.  ?Musculoskeletal:  Positive for arthralgias and myalgias.  ?Neurological:  Positive for headaches. Negative for dizziness and light-headedness.   ? ? ?Physical Exam ?Triage Vital Signs ?ED Triage Vitals  ?Enc Vitals Group  ?   BP 06/21/21 1402 102/71  ?   Pulse Rate 06/21/21 1402 83  ?   Resp 06/21/21 1402 17  ?   Temp 06/21/21 1402 98.4 ?F (36.9 ?C)  ?   Temp src --   ?   SpO2 06/21/21 1402 97 %  ?   Weight --   ?   Height --   ?   Head Circumference --   ?   Peak Flow --   ?   Pain Score 06/21/21 1400 4  ?   Pain Loc --   ?   Pain Edu? --   ?   Excl. in GC? --   ? ?No data found. ? ?Updated Vital Signs ?BP 102/71 (BP Location: Left Arm)   Pulse 83   Temp 98.4 ?F (36.9 ?C)   Resp 17   LMP 06/12/2021   SpO2 97%   Breastfeeding Unknown  ? ?Visual Acuity ?Right Eye Distance:   ?Left Eye Distance:   ?Bilateral Distance:   ? ?Right Eye Near:   ?Left Eye Near:    ?Bilateral Near:    ? ?Physical Exam ?Vitals reviewed.  ?Constitutional:   ?   General: She is awake. She is not in acute distress. ?   Appearance: Normal appearance. She is well-developed. She is not ill-appearing.  ?   Comments: Very pleasant female appears stated age no acute distress  ?HENT:  ?   Head: Normocephalic and atraumatic.  ?   Right Ear: Tympanic membrane, ear canal and external ear normal. Tympanic membrane is not erythematous or bulging.  ?   Left Ear: Tympanic membrane, ear canal and external ear normal. Tympanic membrane is not erythematous or bulging.  ?   Nose:  ?   Right Sinus: No maxillary sinus tenderness or frontal sinus tenderness.  ?   Left Sinus: No maxillary sinus tenderness or frontal sinus tenderness.  ?   Mouth/Throat:  ?   Pharynx: Uvula midline. Posterior oropharyngeal erythema present. No oropharyngeal exudate.  ?   Tonsils: Tonsillar exudate present. No tonsillar abscesses. 1+ on the right. 1+ on the left.  ?Cardiovascular:  ?   Rate and Rhythm: Normal rate and regular rhythm.  ?   Heart sounds: Normal heart sounds, S1 normal and S2 normal. No murmur heard. ?Pulmonary:  ?   Effort: Pulmonary effort is normal.  ?   Breath sounds: Normal breath sounds. No wheezing,  rhonchi or rales.  ?   Comments: Clear to auscultation bilaterally ?Lymphadenopathy:  ?  Head:  ?   Right side of head: No submental, submandibular or tonsillar adenopathy.  ?   Left side of head: No submental, submandibular or tonsillar adenopathy.  ?   Cervical: No cervical adenopathy.  ?Psychiatric:     ?   Behavior: Behavior is cooperative.  ? ? ? ?UC Treatments / Results  ?Labs ?(all labs ordered are listed, but only abnormal results are displayed) ?Labs Reviewed  ?POCT RAPID STREP A, ED / UC - Abnormal; Notable for the following components:  ?    Result Value  ? Streptococcus, Group A Screen (Direct) POSITIVE (*)   ? All other components within normal limits  ? ? ?EKG ? ? ?Radiology ?No results found. ? ?Procedures ?Procedures (including critical care time) ? ?Medications Ordered in UC ?Medications - No data to display ? ?Initial Impression / Assessment and Plan / UC Course  ?I have reviewed the triage vital signs and the nursing notes. ? ?Pertinent labs & imaging results that were available during my care of the patient were reviewed by me and considered in my medical decision making (see chart for details). ? ?  ? ?Patient tested positive for strep pharyngitis.  She is allergic to amoxicillin and so was started on azithromycin.  Recommend she gargle with warm salt water and use Tylenol, ibuprofen for additional symptom relief.  Discussed that she will need to replace her toothbrush within a few days after starting medication to prevent reinfection.  Discussed that she is contagious for 24 hours after starting medication was provided work excuse note.  Discussed that if anything worsens she develops high fever, chest pain, shortness of breath, difficulty speaking, difficulty swallowing, muffled voice she needs to be seen immediately.  Strict return precautions given to which she expressed understanding. ? ?Final Clinical Impressions(s) / UC Diagnoses  ? ?Final diagnoses:  ?Strep pharyngitis  ?Sore throat   ? ? ? ?Discharge Instructions   ? ?  ?Your strep test was positive.  Please start azithromycin as prescribed.  Gargle with warm salt water and use Tylenol and ibuprofen for pain and fever.  Follow-up with [place] if your sympto

## 2021-06-21 NOTE — Discharge Instructions (Signed)
Your strep test was positive.  Please start azithromycin as prescribed.  Gargle with warm salt water and use Tylenol and ibuprofen for pain and fever.  Follow-up with [place] if your symptoms or not improving within a few days.  You will need to be out of work for 24 hours as you are contagious for 24 hours after starting medication.  Make sure to dispose of your toothbrush within a few days after starting antibiotics to prevent reinfection.  If anything worsens and you develop high fever, chest pain, shortness of breath, difficulty speaking, difficulty swallowing, muffled voice you need to be seen immediately in the emergency room. ?

## 2021-06-21 NOTE — ED Triage Notes (Signed)
Pt reports sore throat, body ache and headache, fever and chills for two days. Last night vomiting.  ?

## 2021-06-27 IMAGING — US US MFM OB DETAIL+14 WK
1 series · 12 of 28 positions shown · non-contrast
Comparison: none

[Series 1: us mfm ob detail+14 wk · 12 of 171 slices shown]
[im 7/171]
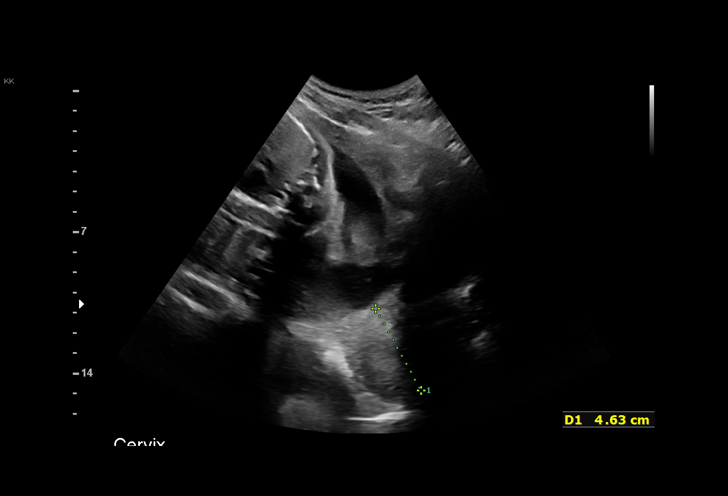
[im 19/171]
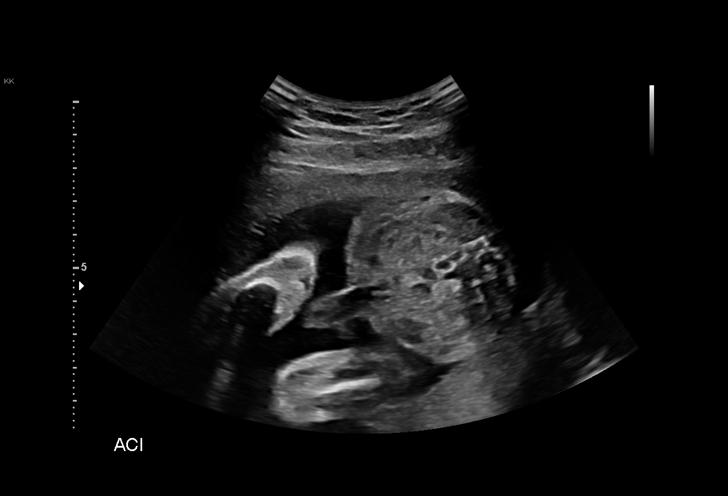
[im 32/171]
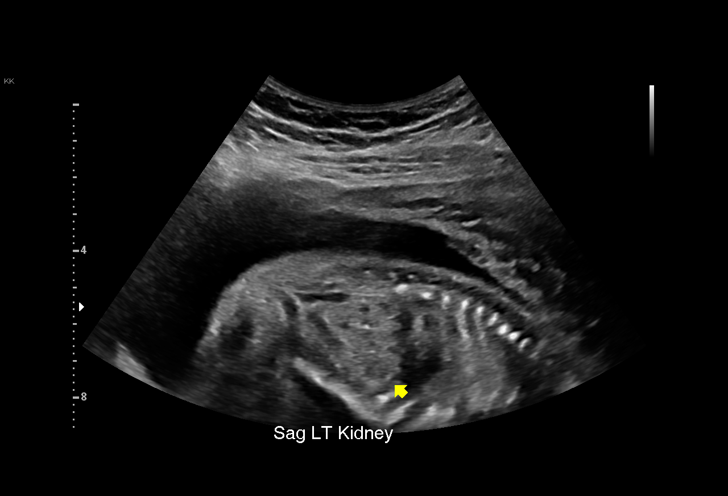
[im 51/171]
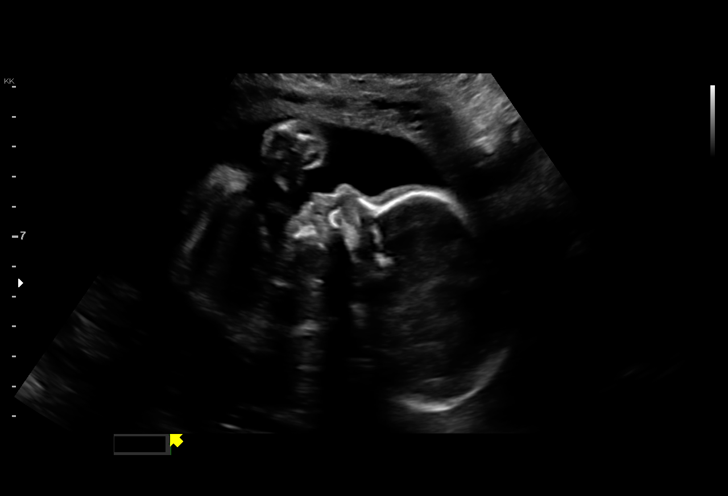
[im 63/171]
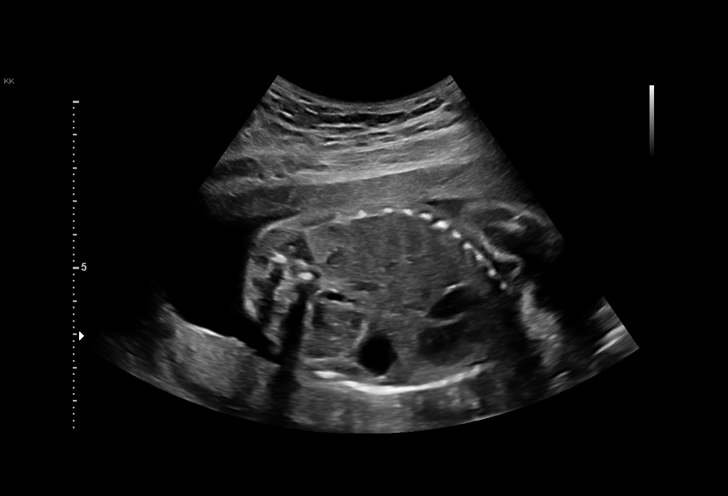
[im 76/171]
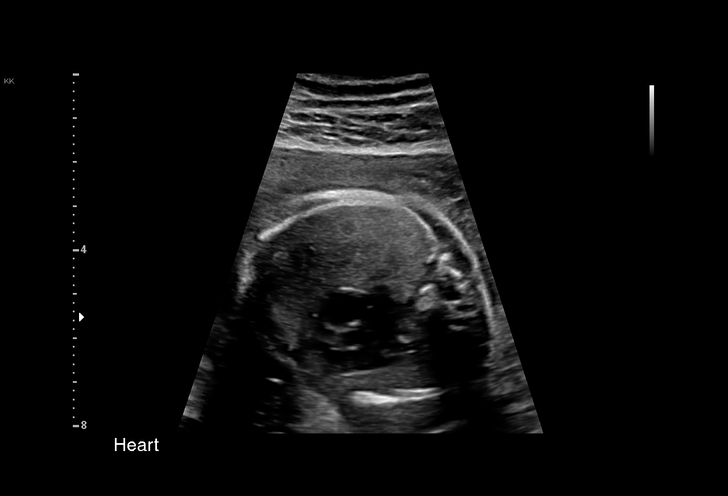
[im 95/171]
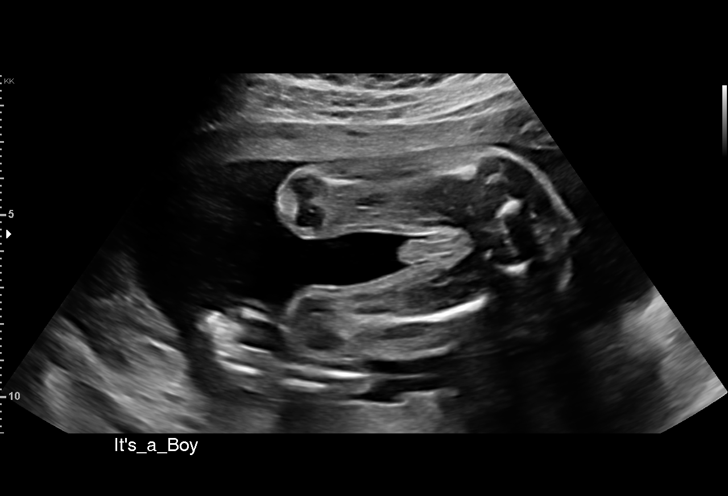
[im 108/171]
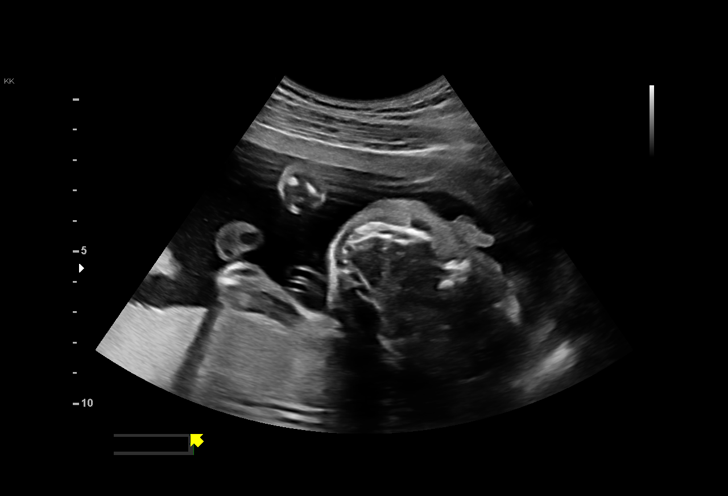
[im 120/171]
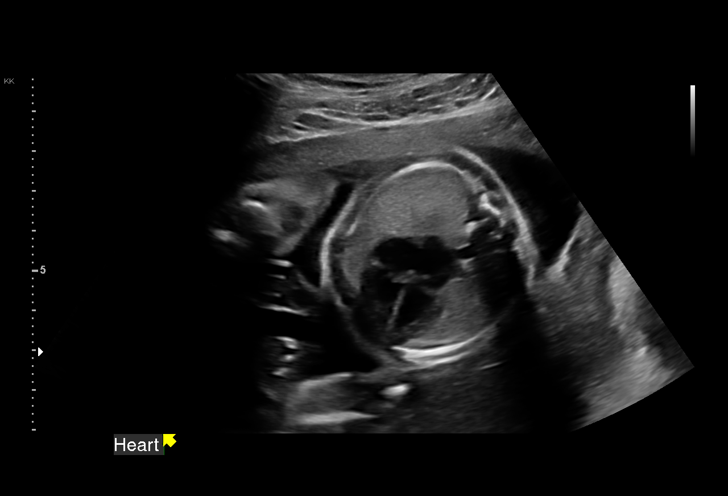
[im 139/171]
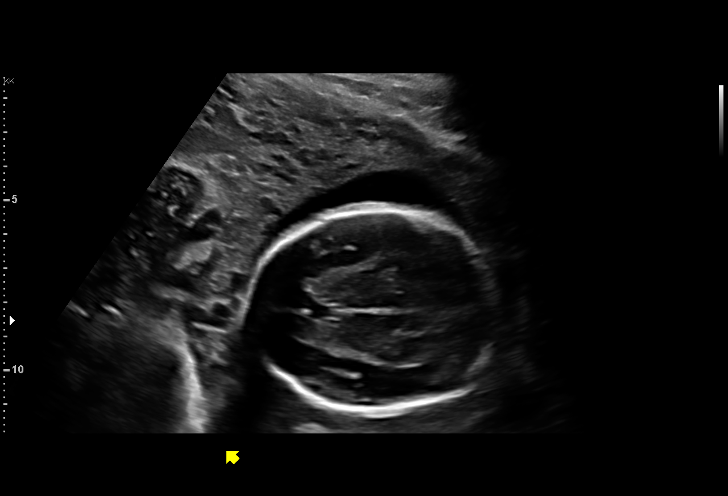
[im 152/171]
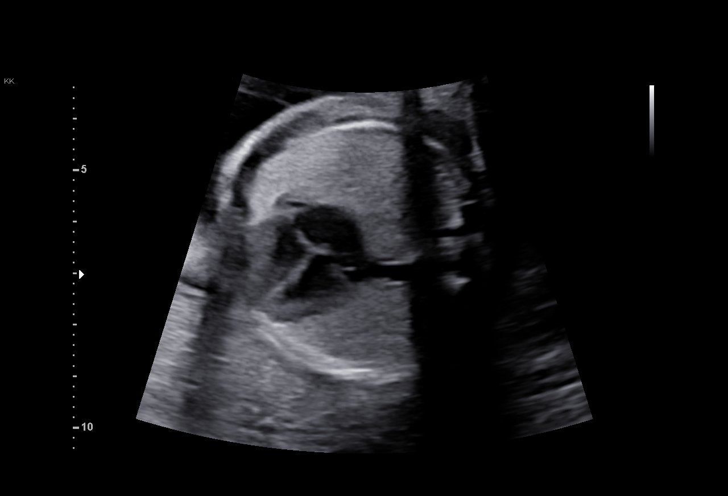
[im 164/171]
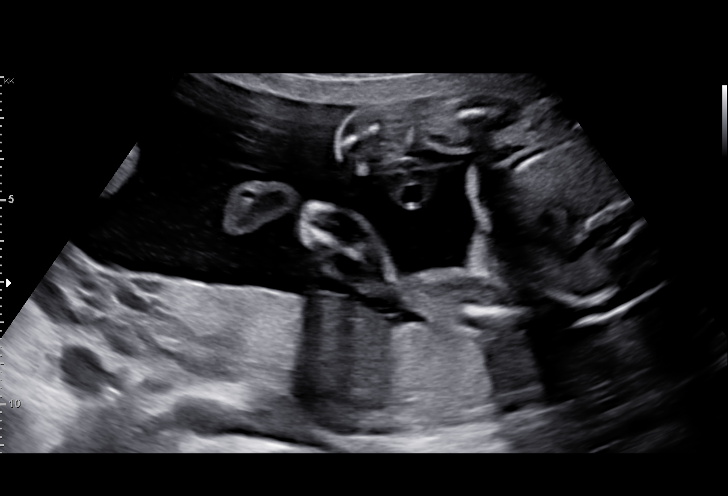

[12 of 28 positions shown; findings below may reference images not displayed]

Indications

 Medication exposure during first trimester of
 pregnancy (Lithium)
 Bipolar disease during pregnancy
 23 weeks gestation of pregnancy
 Encounter for antenatal screening for
 malformations
 Poor obstetric history: Previous preterm
 delivery, antepartum ( 25w 5d twin demise)
 Negative AFP
Fetal Evaluation

 Num Of Fetuses:         1
 Fetal Heart Rate(bpm):  140
 Cardiac Activity:       Observed
 Presentation:           Cephalic
 Placenta:               Posterior
 P. Cord Insertion:      Visualized

 Amniotic Fluid
 AFI FV:      Within normal limits

                             Largest Pocket(cm)

Biometry
 BPD:      58.5  mm     G. Age:  24w 0d         47  %    CI:         74.1   %    70 - 86
                                                         FL/HC:      21.3   %    18.7 -
 HC:      215.8  mm     G. Age:  23w 4d         24  %    HC/AC:      1.15        1.05 -
 AC:      187.1  mm     G. Age:  23w 3d         30  %    FL/BPD:     78.5   %    71 - 87
 FL:       45.9  mm     G. Age:  25w 2d         80  %    FL/AC:      24.5   %    20 - 24
 HUM:      42.7  mm     G. Age:  25w 4d         84  %
 CER:      27.4  mm     G. Age:  24w 3d         80  %

 LV:        4.2  mm
 CM:          6  mm

 Est. FW:     668  gm      1 lb 8 oz     56  %
OB History

 Gravidity:    3         Term:   0        Prem:   1        SAB:   0
 TOP:          1       Ectopic:  0        Living: 0
Gestational Age

 LMP:           23w 6d        Date:  02/05/20                 EDD:   11/11/20
 U/S Today:     24w 1d                                        EDD:   11/09/20
 Best:          23w 6d     Det. By:  LMP  (02/05/20)          EDD:   11/11/20
Anatomy

 Cranium:               Appears normal         LVOT:                   Appears normal
 Cavum:                 Not well visualized    Aortic Arch:            Appears normal
 Ventricles:            Appears normal         Ductal Arch:            Not well visualized
 Choroid Plexus:        Appears normal         Diaphragm:              Appears normal
 Cerebellum:            Appears normal         Stomach:                Appears normal, left
                                                                       sided
 Posterior Fossa:       Appears normal         Abdomen:                Appears normal
 Nuchal Fold:           Not applicable (>20    Abdominal Wall:         Appears nml (cord
                        wks GA)                                        insert, abd wall)
 Face:                  Appears normal         Cord Vessels:           Appears normal (3
                        (orbits and profile)                           vessel cord)
 Lips:                  Appears normal         Kidneys:                Appear normal
 Palate:                Appears normal         Bladder:                Appears normal
 Thoracic:              Appears normal         Spine:                  Appears normal
 Heart:                 Appears normal         Upper Extremities:      Appears normal
                        (4CH, axis, and
                        situs)
 RVOT:                  Appears normal         Lower Extremities:      Appears normal

 Other:  Fetus appears to be a male. Heels visualized. Hands visualized.
Cervix Uterus Adnexa

 Cervix
 Length:           3.34  cm.
 Normal appearance by transabdominal scan.

 Adnexa
 No abnormality visualized.
Comments

 This patient was seen for a detailed fetal anatomy scan due
 to maternal bipolar disorder that is currently treated with
 Zyprexa.  The patient had been taking lithium early in her
 pregnancy.  The lithium was discontinued sometime during
 the first trimester.
 She denies any other significant past medical history and
 denies any problems in her current pregnancy.
 She has declined all screening tests for fetal aneuploidy in
 her current pregnancy.
 She was informed that the fetal growth and amniotic fluid
 level were appropriate for her gestational age.
 There were no obvious fetal anomalies noted on today's
 ultrasound exam.  However, today's exam was limited due to
 the fetal position.
 The patient was informed that anomalies may be missed due
 to technical limitations. If the fetus is in a suboptimal position
 or maternal habitus is increased, visualization of the fetus in
 the maternal uterus may be impaired.
 The patient's mother reports that her bipolar disorder is not
 well controlled with just Zyprexa.  She feels that her bipolar
 disorder is better controlled using a combination of lithium
 and Zyprexa.  The patient and her mother were advised that
 treatment with lithium during pregnancy is possible and has
 been found to be effective in the prevention of relapse of
 bipolar disorder.  As there were no obvious fetal anomalies
 noted on today's exam, they were reassured that her baby
 has most likely not been affected by lithium exposure in the
 first trimester.
 The patient's mother was advised to speak to her psychiatrist
 regarding the resumption of lithium during her pregnancy so
 that her bipolar disease will be better controlled.  Lithium may
 be used in pregnancy if the benefits outweigh the risks.
 Should she be resumed on lithium during her pregnancy,
 weekly monitoring of lithium blood levels in the third trimester
 is recommended.
 Due to her use of lithium during the first trimester, she was
 referred for a fetal echocardiogram with [HOSPITAL] pediatric
 cardiology.
 A follow-up exam was scheduled in 4 weeks to assess the
 fetal growth and to complete the views of the fetal anatomy.

## 2021-07-25 IMAGING — US US MFM OB FOLLOW-UP
1 series · 14 of 28 positions shown · non-contrast
Comparison: none

[Series 1: us mfm ob follow-up · 31 acquisitions, 14 frames shown]
[im 2/31]
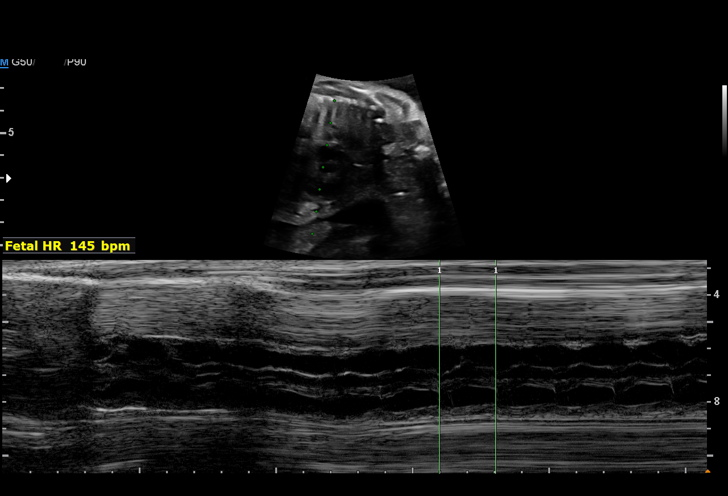
[im 4/31]
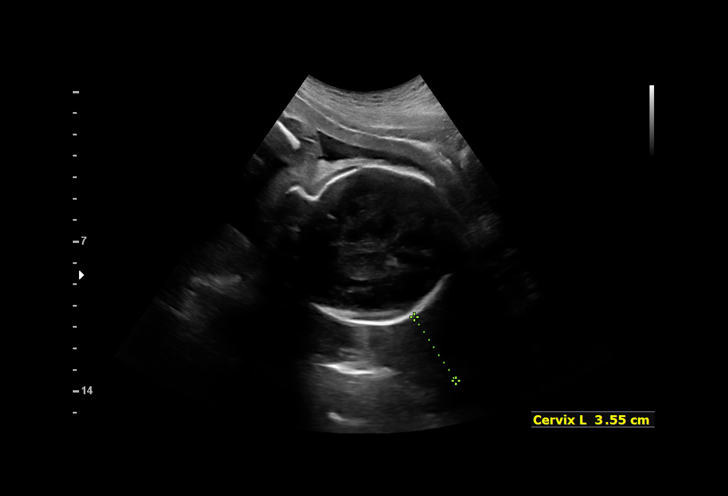
[im 6/31]
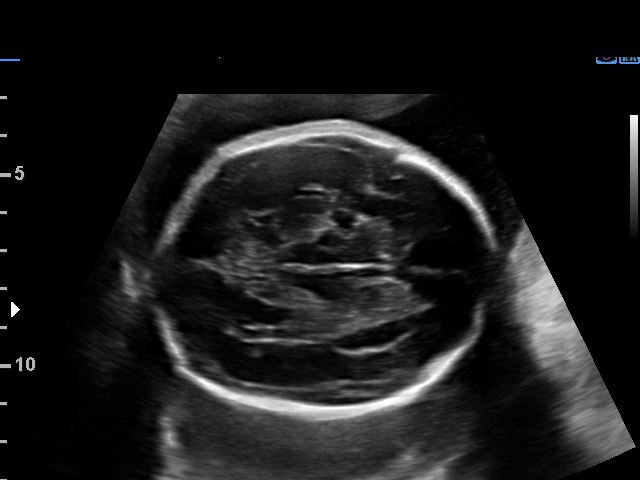
[im 8/31]
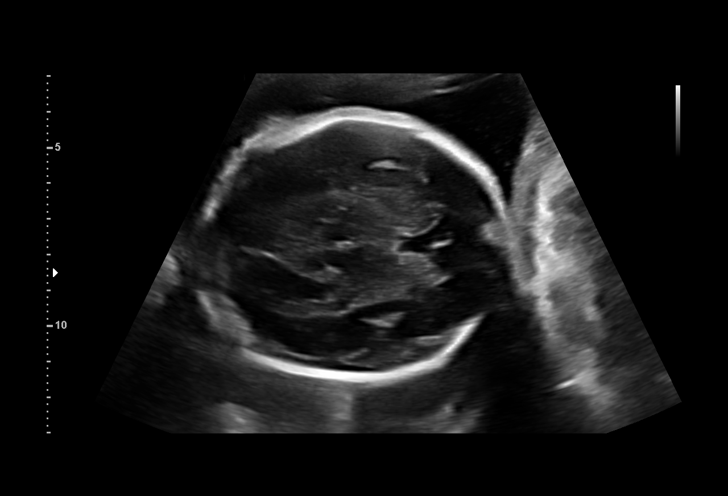
[im 11/31]
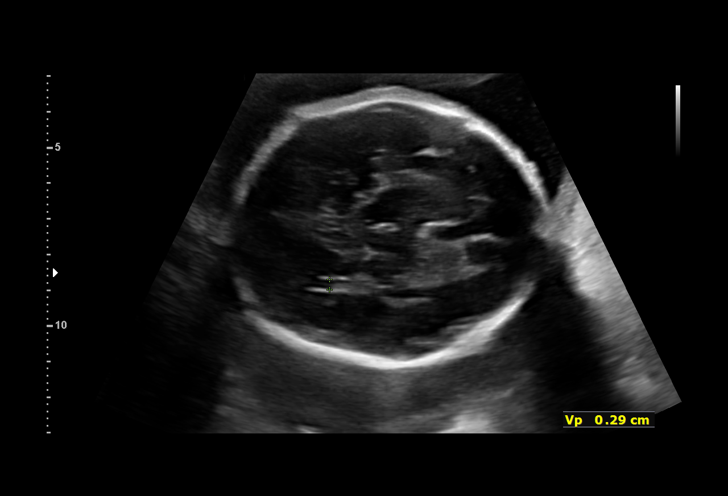
[im 13/31]
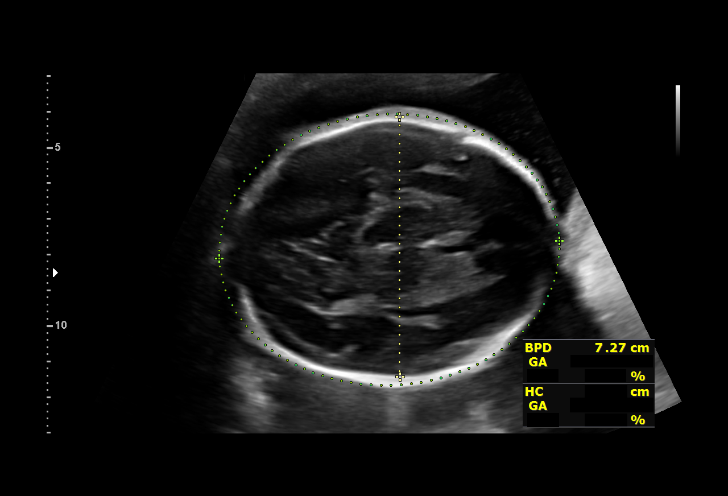
[im 15/31]
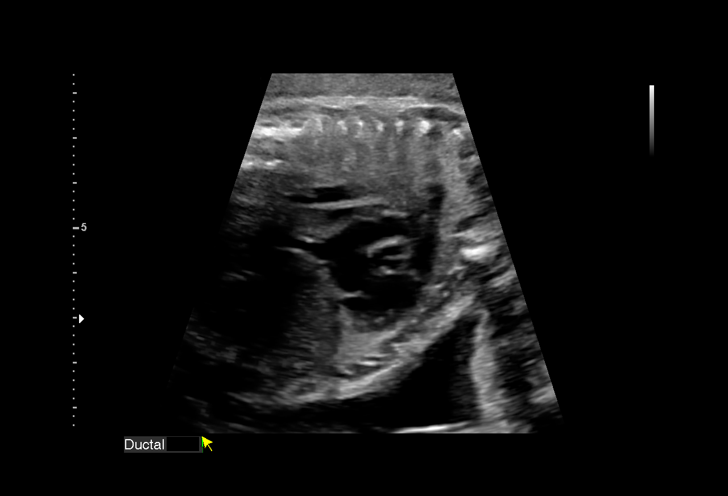
[im 17/31]
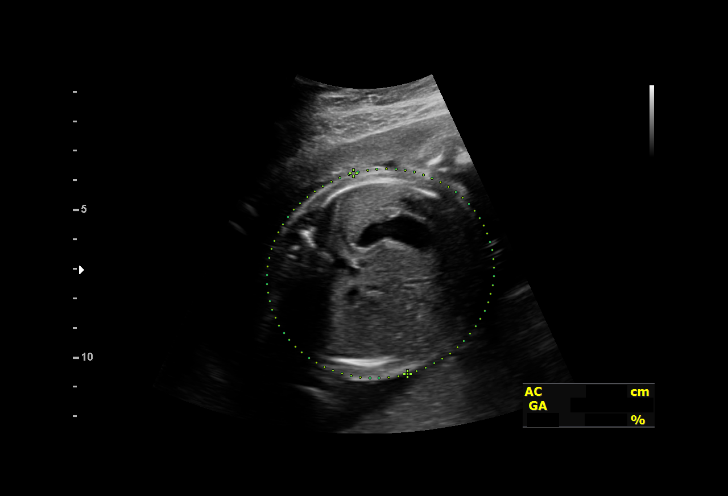
[im 19/31]
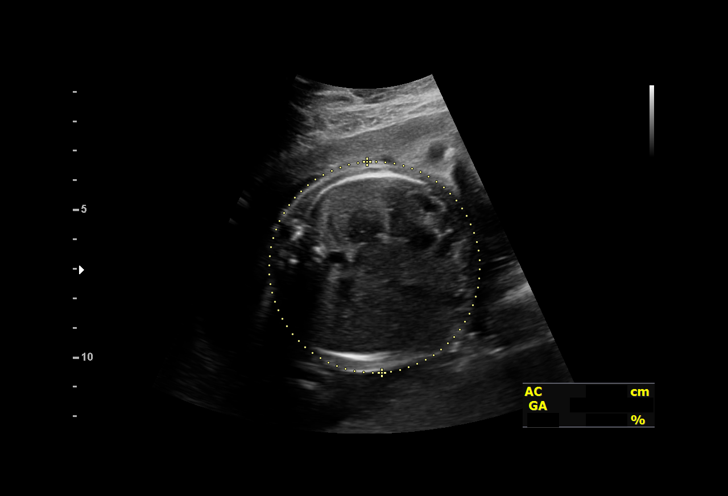
[im 22/31]
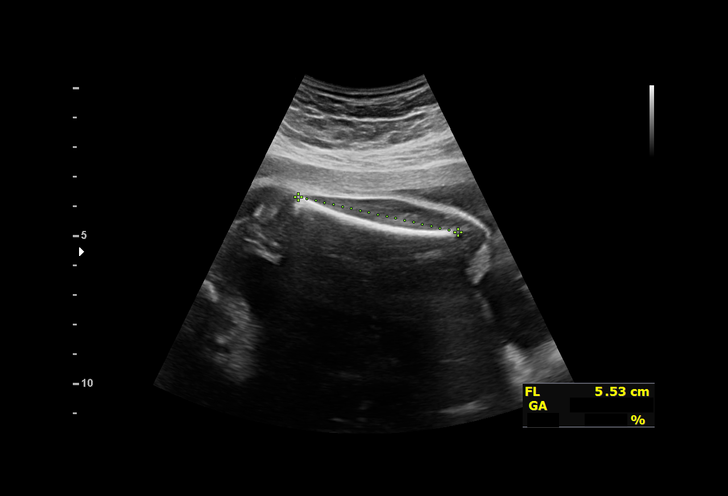
[im 24/31]
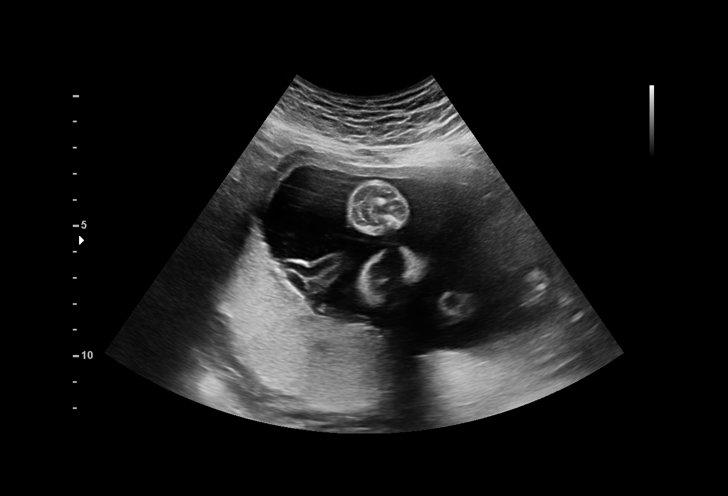
[im 26/31]
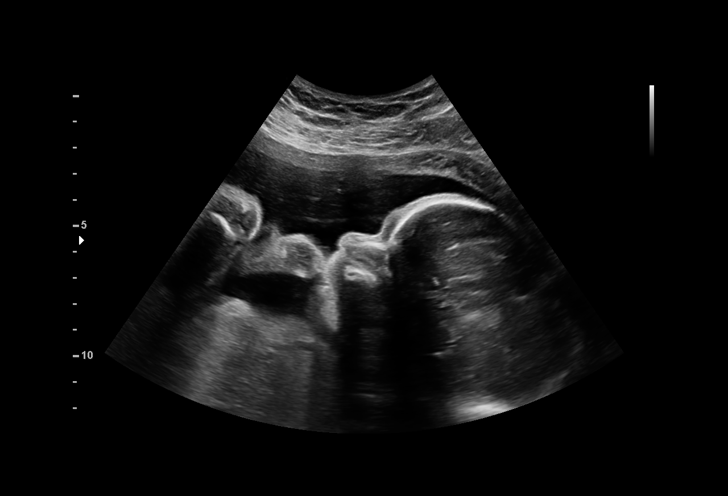
[im 28/31]
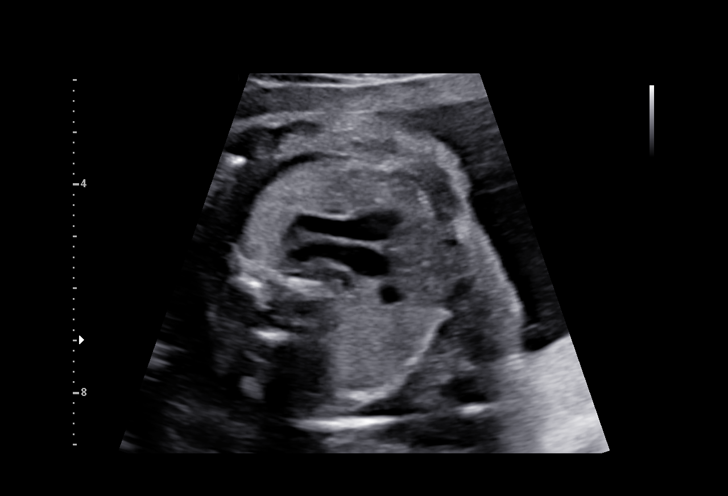
[im 31/31]
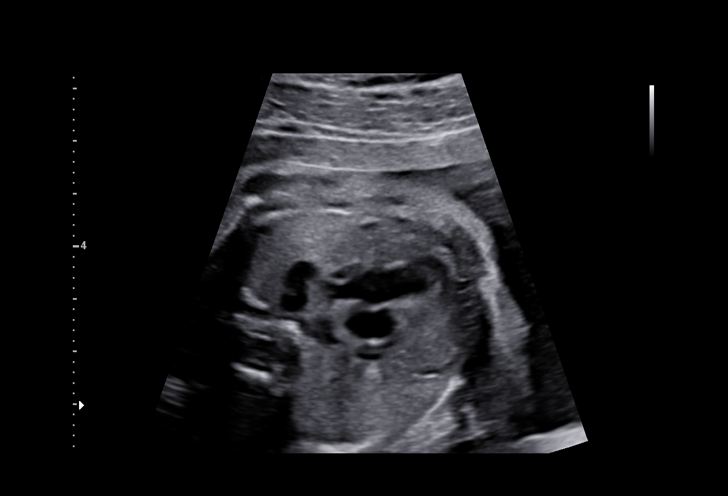

[14 of 28 positions shown; findings below may reference images not displayed]

Indications

 27 weeks gestation of pregnancy
 Medication exposure during first trimester of
 pregnancy (Lithium)
 Bipolar disease during pregnancy
 Poor obstetric history: Previous preterm
 delivery, antepartum ( 25w 5d twin demise)
 Negative AFP
Fetal Evaluation

 Num Of Fetuses:         1
 Fetal Heart Rate(bpm):  145
 Cardiac Activity:       Observed
 Presentation:           Cephalic
 Placenta:               Posterior
 P. Cord Insertion:      Visualized, central

 Amniotic Fluid
 AFI FV:      Within normal limits

                             Largest Pocket(cm)

Biometry

 BPD:      72.3  mm     G. Age:  29w 0d         76  %    CI:        72.82   %    70 - 86
                                                         FL/HC:      20.2   %    18.8 -
 HC:      269.4  mm     G. Age:  29w 3d         68  %    HC/AC:      1.18        1.05 -
 AC:      228.8  mm     G. Age:  27w 2d         24  %    FL/BPD:     75.4   %    71 - 87
 FL:       54.5  mm     G. Age:  28w 6d         64  %    FL/AC:      23.8   %    20 - 24

 LV:        2.9  mm
 Est. FW:    2283  gm      2 lb 9 oz     47  %
OB History

 Gravidity:    3         Term:   0        Prem:   1        SAB:   0
 TOP:          1       Ectopic:  0        Living: 0
Gestational Age

 LMP:           27w 6d        Date:  02/05/20                 EDD:   11/11/20
 U/S Today:     28w 5d                                        EDD:   11/05/20
 Best:          27w 6d     Det. By:  LMP  (02/05/20)          EDD:   11/11/20
Anatomy

 Cranium:               Previously seen        LVOT:                   Previously seen
 Cavum:                 Appears normal         Aortic Arch:            Previously seen
 Ventricles:            Appears normal         Ductal Arch:            Appears normal
 Choroid Plexus:        Previously seen        Diaphragm:              Previously seen
 Cerebellum:            Previously seen        Stomach:                Appears normal, left
                                                                       sided
 Posterior Fossa:       Previously seen        Abdomen:                Previously seen
 Nuchal Fold:           Previously seen        Abdominal Wall:         Previously seen
 Face:                  Orbits and profile     Cord Vessels:           Previously seen
                        previously seen
 Lips:                  Previously seen        Kidneys:                Appear normal
 Palate:                Previously seen        Bladder:                Appears normal
 Thoracic:              Previously seen        Spine:                  Previously seen
 Heart:                 Appears normal         Upper Extremities:      Previously seen
                        (4CH, axis, and
                        situs)
 RVOT:                  Previously seen        Lower Extremities:      Previously seen

 Other:  Fetus appears to be a male. VC, 3VV and 3VTV visualized.
Cervix Uterus Adnexa

 Cervix
 Length:           3.55  cm.
 Normal appearance by transabdominal scan.

 Right Ovary
 Not visualized.

 Left Ovary
 Not visualized.
Impression

 Patient returned for completion of fetal anatomy .Amniotic
 fluid is normal and good fetal activity is seen .Fetal growth is
 appropriate for gestational age .Fetal anatomical survey was
 completed and appears normal.

 We reassured the patient of the findings.
Recommendations

 -An appointment was made for her to return in 8 weeks for
 fetal growth assessment.
                 Ansyah, Hoishan

## 2021-08-15 ENCOUNTER — Ambulatory Visit (INDEPENDENT_AMBULATORY_CARE_PROVIDER_SITE_OTHER): Payer: Medicaid Other

## 2021-08-15 DIAGNOSIS — Z3200 Encounter for pregnancy test, result unknown: Secondary | ICD-10-CM

## 2021-08-15 DIAGNOSIS — N912 Amenorrhea, unspecified: Secondary | ICD-10-CM | POA: Diagnosis not present

## 2021-08-15 DIAGNOSIS — Z3201 Encounter for pregnancy test, result positive: Secondary | ICD-10-CM

## 2021-08-15 LAB — POCT URINE PREGNANCY: Preg Test, Ur: POSITIVE — AB

## 2021-08-15 NOTE — Progress Notes (Signed)
Patient was assessed and managed by nursing staff during this encounter. I have reviewed the chart and agree with the documentation and plan. I have also made any necessary editorial changes. ? ?Catalina Antigua, MD ?08/15/2021 9:46 AM  ? ?

## 2021-08-15 NOTE — Progress Notes (Signed)
..  Cheryl Cohen presents today for UPT. She has no unusual complaints. ?LMP:07/09/21 ?   ?OBJECTIVE: Appears well, in no apparent distress.  ?OB History   ? ? Gravida  ?5  ? Para  ?2  ? Term  ?1  ? Preterm  ?1  ? AB  ?1  ? Living  ?1  ?  ? ? SAB  ?   ? IAB  ?1  ? Ectopic  ?   ? Multiple  ?1  ? Live Births  ?1  ?   ?  ?  ? ?Home UPT Result:Positive  ?In-Office UPT result:Positive ?I have reviewed the patient's medical, obstetrical, social, and family histories, and medications.  ? ?ASSESSMENT: Positive pregnancy test ? ?PLAN ?Prenatal care to be completed at: [facility] ? ? ?

## 2021-09-02 ENCOUNTER — Other Ambulatory Visit (HOSPITAL_COMMUNITY)
Admission: RE | Admit: 2021-09-02 | Discharge: 2021-09-02 | Disposition: A | Discharge status: Home / Self Care | Payer: BC Managed Care – PPO | Source: Ambulatory Visit | Attending: Obstetrics and Gynecology | Admitting: Obstetrics and Gynecology

## 2021-09-02 ENCOUNTER — Ambulatory Visit (INDEPENDENT_AMBULATORY_CARE_PROVIDER_SITE_OTHER): Payer: BC Managed Care – PPO | Admitting: Emergency Medicine

## 2021-09-02 VITALS — BP 114/69 | HR 82 | Wt 167.6 lb

## 2021-09-02 DIAGNOSIS — N760 Acute vaginitis: Secondary | ICD-10-CM | POA: Insufficient documentation

## 2021-09-02 DIAGNOSIS — Z113 Encounter for screening for infections with a predominantly sexual mode of transmission: Secondary | ICD-10-CM | POA: Diagnosis not present

## 2021-09-02 NOTE — Progress Notes (Signed)
SUBJECTIVE:  31 y.o. female requests routine STD screen. Denies abnormal vaginal discharge, bleeding, odor, irritation or significant pelvic pain or fever. No UTI symptoms. Denies history of known exposure to STD.  Patient's last menstrual period was 07/09/2021.  OBJECTIVE:  She appears well, afebrile. Urine dipstick: not done.   PLAN:  GC, chlamydia, trichomonas, BVAG, CVAG probe sent to lab. Treatment: To be determined once lab results are received ROV prn if symptoms persist or worsen.

## 2021-09-02 NOTE — Progress Notes (Signed)
Agree with nurses's documentation of this patient's clinic encounter.  Karrington Mccravy L, MD  

## 2021-09-05 LAB — CERVICOVAGINAL ANCILLARY ONLY
Bacterial Vaginitis (gardnerella): POSITIVE — AB
Candida Glabrata: NEGATIVE
Candida Vaginitis: NEGATIVE
Chlamydia: NEGATIVE
Comment: NEGATIVE
Comment: NEGATIVE
Comment: NEGATIVE
Comment: NEGATIVE
Comment: NEGATIVE
Comment: NORMAL
Neisseria Gonorrhea: NEGATIVE
Trichomonas: NEGATIVE

## 2021-09-06 ENCOUNTER — Other Ambulatory Visit: Payer: Self-pay | Admitting: Emergency Medicine

## 2021-09-06 DIAGNOSIS — N760 Acute vaginitis: Secondary | ICD-10-CM

## 2021-09-06 MED ORDER — METRONIDAZOLE 0.75 % VA GEL
1.0000 | Freq: Every day | VAGINAL | 1 refills | Status: DC
Start: 2021-09-06 — End: 2021-10-14

## 2021-09-12 DIAGNOSIS — F909 Attention-deficit hyperactivity disorder, unspecified type: Secondary | ICD-10-CM | POA: Diagnosis not present

## 2021-09-12 DIAGNOSIS — F310 Bipolar disorder, current episode hypomanic: Secondary | ICD-10-CM | POA: Diagnosis not present

## 2021-09-28 ENCOUNTER — Ambulatory Visit: Payer: Medicaid Other | Admitting: Obstetrics and Gynecology

## 2021-09-30 ENCOUNTER — Encounter: Payer: Medicaid Other | Admitting: Family Medicine

## 2021-10-10 DIAGNOSIS — F310 Bipolar disorder, current episode hypomanic: Secondary | ICD-10-CM | POA: Diagnosis not present

## 2021-10-10 DIAGNOSIS — F909 Attention-deficit hyperactivity disorder, unspecified type: Secondary | ICD-10-CM | POA: Diagnosis not present

## 2021-10-11 ENCOUNTER — Other Ambulatory Visit: Payer: Self-pay

## 2021-10-11 ENCOUNTER — Encounter (HOSPITAL_COMMUNITY): Payer: Self-pay | Admitting: *Deleted

## 2021-10-11 ENCOUNTER — Ambulatory Visit (HOSPITAL_COMMUNITY)
Admission: EM | Admit: 2021-10-11 | Discharge: 2021-10-11 | Disposition: A | Discharge status: Home / Self Care | Payer: BC Managed Care – PPO | Attending: Student | Admitting: Student

## 2021-10-11 DIAGNOSIS — Z3201 Encounter for pregnancy test, result positive: Secondary | ICD-10-CM | POA: Diagnosis not present

## 2021-10-11 DIAGNOSIS — Z9889 Other specified postprocedural states: Secondary | ICD-10-CM | POA: Diagnosis not present

## 2021-10-11 DIAGNOSIS — R197 Diarrhea, unspecified: Secondary | ICD-10-CM | POA: Diagnosis not present

## 2021-10-11 LAB — POCT URINALYSIS DIPSTICK, ED / UC
Bilirubin Urine: NEGATIVE
Glucose, UA: NEGATIVE mg/dL
Hgb urine dipstick: NEGATIVE
Ketones, ur: NEGATIVE mg/dL
Leukocytes,Ua: NEGATIVE
Nitrite: NEGATIVE
Protein, ur: NEGATIVE mg/dL
Specific Gravity, Urine: 1.025 (ref 1.005–1.030)
Urobilinogen, UA: 0.2 mg/dL (ref 0.0–1.0)
pH: 6.5 (ref 5.0–8.0)

## 2021-10-11 LAB — POC URINE PREG, ED: Preg Test, Ur: POSITIVE — AB

## 2021-10-11 NOTE — ED Provider Notes (Signed)
[facility]    CSN: [id number] Arrival date & time: 10/11/21  0844      History   Chief Complaint Chief Complaint  Patient presents with   Diarrhea    HPI Cheryl Cohen is a 31 y.o. female presenting with diarrhea x5 days, improving on its own. States she typically gets diarrhea with pregnancy so she is concerned for this. She had a medical abortion on 08/29/21, and seemed to pass the fetus completely, with no lingering bleeding or abd pain. She unfortunately was no able to attend her f/[facility] appt with [facility] States she did have unprotected intercourse approximately 3 weeks ago. She states the diarrhea has resolved over the last 24 hours. Denies any n/v/d/abd pain today. Denies recent travel, abx. Denies hematuria, dysuria, frequency, urgency, back pain, n/v/d/abd pain, fevers/chills, abdnormal vaginal discharge.    HPI  Past Medical History:  Diagnosis Date   Anxiety    Bipolar 1 disorder (HCC)    hospitalized  3 x onset rx in 9th grade    Depression    Gestational diabetes    Gonorrhea    Hx of varicella    Syphilis    Twin pregnancy, delivered vaginally, IUFD stillborns 09/09/2015   IUFD  22 + weeks  still borns     Patient Active Problem List   Diagnosis Date Noted   Gestational diabetes mellitus (GDM), antepartum 08/25/2020   Psychotic disorder with delusions (HCC)    Bipolar affective disorder, mixed, severe (HCC) 10/04/2016   Fetal demise, greater than 22 weeks, antepartum 09/08/2015   Suicidal ideation 10/06/2013   Family history of diabetes mellitus type II 01/11/2012    Past Surgical History:  Procedure Laterality Date   DILATION AND EVACUATION N/A 09/09/2015   Procedure: DILATATION AND EVACUATION;  Surgeon: [name], MD;  Location: [facility];  Service: Gynecology;  Laterality: N/A;   TYMPANOSTOMY TUBE PLACEMENT     asage 2-3 yrs    OB History     Gravida  5   Para  2   Term  1   Preterm  1   AB   1   Living  1      SAB      IAB  1   Ectopic      Multiple  1   Live Births  1            Home Medications    Prior to Admission medications   Medication Sig Start Date End Date Taking? Authorizing Provider  lithium carbonate (ESKALITH) 450 MG CR tablet Take 450 mg by mouth daily.    [provider]  metroNIDAZOLE (METROGEL) 0.75 % vaginal gel Place 1 Applicatorful vaginally at bedtime. Apply one applicatorful to vagina at bedtime for 5 days 09/06/21   Hermina Staggers, MD  OLANZapine (ZYPREXA) 15 MG tablet Take 15 mg by mouth at bedtime.    [provider]    Family History Family History  Problem Relation Age of Onset   Diabetes Mother    Arthritis Mother    Hypertension Father    Alcohol abuse Father    Alcoholism Father     Social History Social History   Tobacco Use   Smoking status: Former    Packs/day: 0.20    Types: Cigarettes   Smokeless tobacco: Never   Tobacco comments:    1 cigarette a day  Vaping Use   Vaping Use: Never used  Substance  Use Topics   Alcohol use: No   Drug use: No     Allergies   Amoxicillin   Review of Systems Review of Systems  Constitutional:  Negative for appetite change, chills, diaphoresis, fever and unexpected weight change.  HENT:  Negative for congestion, ear pain, sinus pressure, sinus pain, sneezing, sore throat and trouble swallowing.   Respiratory:  Negative for cough, chest tightness and shortness of breath.   Cardiovascular:  Negative for chest pain.  Gastrointestinal:  Positive for diarrhea. Negative for abdominal distention, abdominal pain, anal bleeding, blood in stool, constipation, nausea, rectal pain and vomiting.  Genitourinary:  Negative for dysuria, flank pain, frequency and urgency.  Musculoskeletal:  Negative for back pain and myalgias.  Neurological:  Negative for dizziness, light-headedness and headaches.  All other systems reviewed and are negative.    Physical  Exam Triage Vital Signs ED Triage Vitals  Enc Vitals Group     BP 10/11/21 0952 106/66     Pulse Rate 10/11/21 0952 69     Resp 10/11/21 0952 18     Temp 10/11/21 0952 98.5 F (36.9 C)     Temp src --      SpO2 10/11/21 0952 100 %     Weight --      Height --      Head Circumference --      Peak Flow --      Pain Score 10/11/21 0950 0     Pain Loc --      Pain Edu? --      Excl. in GC? --    No data found.  Updated Vital Signs BP 106/66   Pulse 69   Temp 98.5 F (36.9 C)   Resp 18   LMP 07/09/2021 Comment: Pt reports hving an abortion end of May2023  SpO2 100%   Breastfeeding No   Visual Acuity Right Eye Distance:   Left Eye Distance:   Bilateral Distance:    Right Eye Near:   Left Eye Near:    Bilateral Near:     Physical Exam Vitals reviewed.  Constitutional:      General: She is not in acute distress.    Appearance: Normal appearance. She is not ill-appearing.  HENT:     Head: Normocephalic and atraumatic.     Mouth/Throat:     Mouth: Mucous membranes are moist.     Comments: Moist mucous membranes Eyes:     Extraocular Movements: Extraocular movements intact.     Pupils: Pupils are equal, round, and reactive to light.  Cardiovascular:     Rate and Rhythm: Normal rate and regular rhythm.     Heart sounds: Normal heart sounds.  Pulmonary:     Effort: Pulmonary effort is normal.     Breath sounds: Normal breath sounds. No wheezing, rhonchi or rales.  Abdominal:     General: Bowel sounds are normal. There is no distension.     Palpations: Abdomen is soft. There is no mass.     Tenderness: There is no abdominal tenderness. There is no right CVA tenderness, left CVA tenderness, guarding or rebound.     Comments: No pain on exam.   Skin:    General: Skin is warm.     Capillary Refill: Capillary refill takes less than 2 seconds.     Comments: Good skin turgor  Neurological:     General: No focal deficit present.     Mental Status: She is alert and  oriented to  person, place, and time.  Psychiatric:        Mood and Affect: Mood normal.        Behavior: Behavior normal.      UC Treatments / Results  Labs (all labs ordered are listed, but only abnormal results are displayed) Labs Reviewed  POC URINE PREG, ED  POCT URINALYSIS DIPSTICK, ED / UC    EKG   Radiology No results found.  Procedures Procedures (including critical care time)  Medications Ordered in UC Medications - No data to display  Initial Impression / Assessment and Plan / UC Course  I have reviewed the triage vital signs and the nursing notes.  Pertinent labs & imaging results that were available during my care of the patient were reviewed by me and considered in my medical decision making (see chart for details).     This patient is a very pleasant 31 y.o. year old female presenting with positive pregnancy test. Afebrile, nontachy.  Of note, she did have a medical abortion on 08/29/2021, but has had unprotected intercourse since then.  She typically gets diarrhea with her pregnancies, though this seems to have improved in the last 24 hours.  Denies recent travel, antibiotics, eating out.  Her U-preg is positive today.  Suspect this is a new pregnancy rather than retained abortion, as she is having no abdominal or pelvic pain, and the vaginal bleeding resolved after the abortion. She will f[facility] with [facility], which provided her with the medical abortion several weeks ago. ED return precautions discussed. Patient verbalizes understanding and agreement.    Final Clinical Impressions(s) / UC Diagnoses   Final diagnoses:  Diarrhea, unspecified type  Positive pregnancy test  History of elective abortion     Discharge Instructions      -Your pregnancy test was positive today. You need to follow-up with the clinic that performed the original abortion to see if it's a new pregnancy or if you had an incomplete abortion. You should call them today to set  this up.   ED Prescriptions   None    PDMP not reviewed this encounter.   Rhys Martini, PA-C 10/11/21 1115

## 2021-10-11 NOTE — Discharge Instructions (Addendum)
-  Your pregnancy test was positive today. You need to follow-up with the clinic that performed the original abortion to see if it's a new pregnancy or if you had an incomplete abortion. You should call them today to set this up.

## 2021-10-11 NOTE — ED Triage Notes (Signed)
Pt reports having diarrhea since Friday.

## 2021-10-12 ENCOUNTER — Inpatient Hospital Stay (HOSPITAL_COMMUNITY)
Admission: AD | Admit: 2021-10-12 | Discharge: 2021-10-12 | Disposition: A | Discharge status: Home / Self Care | Payer: BC Managed Care – PPO | Attending: Obstetrics and Gynecology | Admitting: Obstetrics and Gynecology

## 2021-10-12 ENCOUNTER — Encounter (HOSPITAL_COMMUNITY): Payer: Self-pay

## 2021-10-12 ENCOUNTER — Inpatient Hospital Stay (HOSPITAL_COMMUNITY): Payer: BC Managed Care – PPO

## 2021-10-12 DIAGNOSIS — Z789 Other specified health status: Secondary | ICD-10-CM

## 2021-10-12 DIAGNOSIS — Z3A01 Less than 8 weeks gestation of pregnancy: Secondary | ICD-10-CM | POA: Insufficient documentation

## 2021-10-12 DIAGNOSIS — O4691 Antepartum hemorrhage, unspecified, first trimester: Secondary | ICD-10-CM | POA: Diagnosis not present

## 2021-10-12 DIAGNOSIS — O3680X0 Pregnancy with inconclusive fetal viability, not applicable or unspecified: Secondary | ICD-10-CM | POA: Diagnosis not present

## 2021-10-12 DIAGNOSIS — O209 Hemorrhage in early pregnancy, unspecified: Secondary | ICD-10-CM | POA: Diagnosis not present

## 2021-10-12 DIAGNOSIS — Z3A00 Weeks of gestation of pregnancy not specified: Secondary | ICD-10-CM | POA: Diagnosis not present

## 2021-10-12 LAB — WET PREP, GENITAL
Clue Cells Wet Prep HPF POC: NONE SEEN
Sperm: NONE SEEN
Trich, Wet Prep: NONE SEEN
WBC, Wet Prep HPF POC: <10 (ref <10)
Yeast Wet Prep HPF POC: NONE SEEN

## 2021-10-12 LAB — URINALYSIS, ROUTINE W REFLEX MICROSCOPIC
Bilirubin Urine: NEGATIVE
Glucose, UA: NEGATIVE mg/dL
Ketones, ur: NEGATIVE mg/dL
Leukocytes,Ua: NEGATIVE
Nitrite: NEGATIVE
Protein, ur: NEGATIVE mg/dL
Specific Gravity, Urine: 1.021 (ref 1.005–1.030)
pH: 5 (ref 5.0–8.0)

## 2021-10-12 LAB — CBC
HCT: 43.1 % (ref 36.0–46.0)
Hemoglobin: 14.9 g/dL (ref 12.0–15.0)
MCH: 31 pg (ref 26.0–34.0)
MCHC: 34.6 g/dL (ref 30.0–36.0)
MCV: 89.8 fL (ref 80.0–100.0)
Platelets: 326 10*3/uL (ref 150–400)
RBC: 4.8 MIL/uL (ref 3.87–5.11)
RDW: 12.2 % (ref 11.5–15.5)
WBC: 5.5 10*3/uL (ref 4.0–10.5)
nRBC: 0 % (ref 0.0–0.2)

## 2021-10-12 LAB — HCG, QUANTITATIVE, PREGNANCY: hCG, Beta Chain, Quant, S: 18 m[IU]/mL — ABNORMAL HIGH (ref <5)

## 2021-10-12 NOTE — [facility] Note (Signed)
Cheryl Cohen is a 31 y.o. here in [facility] reporting: had positive pregnancy test at urgent care yesterday. Would like some blood work done for confirmation. States she started having some vaginal bleeding yesterday. States bleeding is light. No abnormal discharge. No pain.   LMP: 07/09/2021 but had termination of pregnancy on 08/29/2021  Onset of complaint: yesterday  Pain score: 0/10  Vitals:   10/12/21 1005  BP: 104/72  Pulse: 78  Resp: 16  Temp: 98.1 F (36.7 C)  SpO2: 98%     Lab orders placed from triage: UA

## 2021-10-12 NOTE — MAU Provider Note (Signed)
History     CSN: [id number]  Arrival date and time: 10/12/21 0943   Event Date/Time   First Provider Initiated Contact with Patient 10/12/21 1018      Chief Complaint  Patient presents with   Vaginal Bleeding   Cheryl Cohen, a  31 y.o. [id number] at Unknown presents to MAU with complaints of "mild" vaginal bleeding and intermittent abdominal pain that started yesterday. Patient states she had a positive pregnancy test at urgent care yesterday. She describes bleeding as "brownish red" and noticed when wiping. Patient states she is wearing a pad today but denies changing frequently. Unknown LMP.  Patient had a termination at the end of May and menses has not returned.     - Abdominal pain: She also endorses intermittent pelvic pain that accompanied the bleeding. States "it comes and goes, but doesn't last very long." She describes it as "discomfort" and "not really painful." No pain currently. Denies taking anything for pain. She denies urinary and other vaginal symptoms.     OB History     Gravida  6   Para  2   Term  1   Preterm  1   AB  1   Living  1      SAB      IAB  1   Ectopic      Multiple  1   Live Births  1           Past Medical History:  Diagnosis Date   Anxiety    Bipolar 1 disorder (HCC)    hospitalized  3 x onset rx in 9th grade    Depression    Gestational diabetes    Gonorrhea    Hx of varicella    Syphilis    Twin pregnancy, delivered vaginally, IUFD stillborns 09/09/2015   IUFD  22 + weeks  still borns     Past Surgical History:  Procedure Laterality Date   DILATION AND EVACUATION N/A 09/09/2015   Procedure: DILATATION AND EVACUATION;  Surgeon: [name], MD;  Location: [facility];  Service: Gynecology;  Laterality: N/A;   TYMPANOSTOMY TUBE PLACEMENT     asage 2-3 yrs    Family History  Problem Relation Age of Onset   Diabetes Mother    Arthritis Mother    Hypertension Father    Alcohol abuse Father    Alcoholism  Father     Social History   Tobacco Use   Smoking status: Former    Packs/day: 0.20    Types: Cigarettes   Smokeless tobacco: Never   Tobacco comments:    1 cigarette a day  Vaping Use   Vaping Use: Never used  Substance Use Topics   Alcohol use: No   Drug use: No    Allergies:  Allergies  Allergen Reactions   Amoxicillin Rash and Other (See Comments)    Has patient had a PCN reaction causing immediate rash, facial/tongue/throat swelling, SOB or lightheadedness with hypotension: yes Has patient had a PCN reaction causing severe rash involving mucus membranes or skin necrosis: No Has patient had a PCN reaction that required hospitalization: No Has patient had a PCN reaction occurring within the last 10 years: Yes If all of the above answers are "NO", then may proceed with Cephalosporin use.     Medications Prior to Admission  Medication Sig Dispense Refill Last Dose   lithium carbonate (ESKALITH) 450 MG CR tablet Take 450 mg by mouth daily.  metroNIDAZOLE (METROGEL) 0.75 % vaginal gel Place 1 Applicatorful vaginally at bedtime. Apply one applicatorful to vagina at bedtime for 5 days 70 g 1    OLANZapine (ZYPREXA) 15 MG tablet Take 15 mg by mouth at bedtime.       Review of Systems  Constitutional:  Negative for appetite change, chills, fatigue and fever.  Respiratory:  Negative for chest tightness, shortness of breath and wheezing.   Cardiovascular:  Negative for chest pain.  Gastrointestinal:  Positive for abdominal pain. Negative for blood in stool, constipation, diarrhea, nausea and vomiting.  Genitourinary:  Positive for pelvic pain and vaginal bleeding. Negative for flank pain, vaginal discharge and vaginal pain.  Neurological:  Negative for headaches.   Physical Exam   Blood pressure 104/72, pulse 78, temperature 98.1 F (36.7 C), temperature source Oral, resp. rate 16, height 5\' 7"  (1.702 m), weight 72.8 kg, SpO2 98 %, not currently  breastfeeding.  Physical Exam Vitals and nursing note reviewed.  Constitutional:      General: She is not in acute distress. HENT:     Head: Normocephalic.  Pulmonary:     Effort: Pulmonary effort is normal.  Abdominal:     Palpations: Abdomen is soft.  Musculoskeletal:        General: Normal range of motion.     Cervical back: Normal range of motion.  Skin:    General: Skin is warm and dry.  Neurological:     Mental Status: She is alert and oriented to person, place, and time.  Psychiatric:        Mood and Affect: Mood normal.     MAU Course  Procedures Lab Orders         Wet prep, genital         Urinalysis, Routine w reflex microscopic Urine, Clean Catch         CBC         hCG, quantitative, pregnancy    TUVS ordered  Results for orders placed or performed during the hospital encounter of 10/12/21 (from the past 24 hour(s))  Wet prep, genital     Status: None   Collection Time: 10/12/21 10:25 AM  Result Value Ref Range   Yeast Wet Prep HPF POC NONE SEEN NONE SEEN   Trich, Wet Prep NONE SEEN NONE SEEN   Clue Cells Wet Prep HPF POC NONE SEEN NONE SEEN   WBC, Wet Prep HPF POC <10 <10   Sperm NONE SEEN   CBC     Status: None   Collection Time: 10/12/21 10:29 AM  Result Value Ref Range   WBC 5.5 4.0 - 10.5 K/uL   RBC 4.80 3.87 - 5.11 MIL/uL   Hemoglobin 14.9 12.0 - 15.0 g/dL   HCT 12/13/21 38.1 - 82.9 %   MCV 89.8 80.0 - 100.0 fL   MCH 31.0 26.0 - 34.0 pg   MCHC 34.6 30.0 - 36.0 g/dL   RDW 93.7 16.9 - 67.8 %   Platelets 326 150 - 400 K/uL   nRBC 0.0 0.0 - 0.2 %  ABO/Rh     Status: None (Preliminary result)   Collection Time: 10/12/21 10:29 AM  Result Value Ref Range   ABO/RH(D) PENDING   hCG, quantitative, pregnancy     Status: Abnormal   Collection Time: 10/12/21 10:29 AM  Result Value Ref Range   hCG, Beta Chain, Quant, S 18 (H) <5 mIU/mL  Urinalysis, Routine w reflex microscopic Urine, Clean Catch     Status: Abnormal  Collection Time: 10/12/21 10:33 AM   Result Value Ref Range   Color, Urine YELLOW YELLOW   APPearance CLEAR CLEAR   Specific Gravity, Urine 1.021 1.005 - 1.030   pH 5.0 5.0 - 8.0   Glucose, UA NEGATIVE NEGATIVE mg/dL   Hgb urine dipstick LARGE (A) NEGATIVE   Bilirubin Urine NEGATIVE NEGATIVE   Ketones, ur NEGATIVE NEGATIVE mg/dL   Protein, ur NEGATIVE NEGATIVE mg/dL   Nitrite NEGATIVE NEGATIVE   Leukocytes,Ua NEGATIVE NEGATIVE   RBC / HPF 0-5 0 - 5 RBC/hpf   WBC, UA 0-5 0 - 5 WBC/hpf   Bacteria, UA RARE (A) NONE SEEN   Squamous Epithelial / LPF 0-5 0 - 5   Mucus PRESENT    US OB LESS THAN 14 WEEKS WITH OB TRANSVAGINAL  Result Date: 10/12/2021 CLINICAL DATA:  Vaginal bleeding in 1st trimester pregnancy. Unknown LMP. EXAM: OBSTETRIC <14 WK [place] AND TRANSVAGINAL OB US TECHNIQUE: Both transabdominal and transvaginal ultrasound examinations were performed for complete evaluation of the gestation as well as the maternal uterus, adnexal regions, and pelvic cul-de-sac. Transvaginal technique was performed to assess early pregnancy. COMPARISON:  None Available. FINDINGS: Intrauterine gestational sac: None Maternal uterus/adnexae: Small left ovarian corpus luteum noted. Normal appearance of right ovary. No adnexal mass or abnormal free fluid identified. IMPRESSION: Pregnancy of unknown anatomic location (no intrauterine gestational sac or adnexal mass identified). Differential diagnosis includes recent spontaneous abortion, IUP too early to visualize, and non-visualized ectopic pregnancy. Recommend correlation with serial beta-hCG levels, and follow up US if warranted clinically. Electronically Signed   By: Danae Orleans M.D.   On: 10/12/2021 11:15    MDM Quant Results- 18 Imaging: One of 3 options reviewed, IUP too early, recent abortion, and non-visualized ectopic. Uncertain if residual effects of pervious termination in May.   Plan repeat quant follow-up and discharge.   Assessment and Plan   1. Pregnancy of unknown anatomic  location   2. Date of last menstrual period (LMP) unknown   - Discussed with patient that results today are indefinite with 3 possible outcomes and the recommendation is to follow up with repeat blood work.  - Patient scheduled for repeat Bhcg Friday 10/12/21 @ 1025am at [facility].  - Bleeding and ectopic precautions reviewed with patient.  - Patient discharged home in stable condition and may return to MAU as needed.   Claudette Head, MSN CNM  10/12/2021, 10:18 AM

## 2021-10-13 LAB — GC/CHLAMYDIA PROBE AMP (~~LOC~~) NOT AT ARMC
Chlamydia: NEGATIVE
Comment: NEGATIVE
Comment: NORMAL
Neisseria Gonorrhea: NEGATIVE

## 2021-10-13 LAB — ABO/RH: ABO/RH(D): B POS

## 2021-10-14 ENCOUNTER — Other Ambulatory Visit: Payer: Self-pay

## 2021-10-14 ENCOUNTER — Ambulatory Visit (INDEPENDENT_AMBULATORY_CARE_PROVIDER_SITE_OTHER): Payer: BC Managed Care – PPO

## 2021-10-14 VITALS — BP 110/74 | HR 70 | Wt 159.2 lb

## 2021-10-14 DIAGNOSIS — O3680X0 Pregnancy with inconclusive fetal viability, not applicable or unspecified: Secondary | ICD-10-CM

## 2021-10-14 DIAGNOSIS — R87619 Unspecified abnormal cytological findings in specimens from cervix uteri: Secondary | ICD-10-CM | POA: Insufficient documentation

## 2021-10-14 LAB — BETA HCG QUANT (REF LAB): hCG Quant: 8 m[IU]/mL

## 2021-10-14 NOTE — Progress Notes (Signed)
Beta HCG Follow-up Visit  Cheryl Cohen presents to [facility] for follow-up beta HCG lab. She was seen in MAU for vaginal bleeding and abdominal pain on 10/12/21. Beta HCG at that visit was 18. [place] does not show IUP. Recent history significant for TAB May 2023.  Reports continued light vaginal bleeding since 10/11/21. Denies any abdominal pain today. Discussed with patient that we are following beta HCG levels today. Explained results will be reviewed by provider and patient contacted with any urgent results. Explained if not urgent result, pt may not be contacted until Monday AM. Ectopic precautions reviewed with patient.   Marjo Bicker 10/14/2021 11:06 AM

## 2021-10-17 ENCOUNTER — Telehealth: Payer: Self-pay | Admitting: *Deleted

## 2021-10-17 NOTE — Telephone Encounter (Signed)
-----   Message from Catalina Antigua, MD sent at 10/15/2021 10:46 AM EDT ----- Patient with recent medical termination of pregnancy. Appropriate drop in quant HCG. No need for further quant HCG.  Please offer family planning appointment if patient is interested in contraception counseling

## 2021-10-17 NOTE — Telephone Encounter (Signed)
I called Cheryl Cohen and informed her results and recommendations from Dr. Jolayne Panther. She voices understanding and thanked me for calling.  [name]

## 2021-11-17 ENCOUNTER — Ambulatory Visit: Payer: Medicaid Other | Admitting: Obstetrics and Gynecology

## 2021-12-07 DIAGNOSIS — F310 Bipolar disorder, current episode hypomanic: Secondary | ICD-10-CM | POA: Diagnosis not present

## 2021-12-07 DIAGNOSIS — F909 Attention-deficit hyperactivity disorder, unspecified type: Secondary | ICD-10-CM | POA: Diagnosis not present

## 2022-01-04 ENCOUNTER — Ambulatory Visit (INDEPENDENT_AMBULATORY_CARE_PROVIDER_SITE_OTHER): Payer: BC Managed Care – PPO | Admitting: General Practice

## 2022-01-04 VITALS — BP 104/65 | HR 87 | Ht 67.0 in | Wt 149.6 lb

## 2022-01-04 DIAGNOSIS — Z3202 Encounter for pregnancy test, result negative: Secondary | ICD-10-CM

## 2022-01-04 DIAGNOSIS — Z3200 Encounter for pregnancy test, result unknown: Secondary | ICD-10-CM

## 2022-01-04 LAB — POCT URINE PREGNANCY: Preg Test, Ur: NEGATIVE

## 2022-01-04 NOTE — Progress Notes (Signed)
Ms. Meder presents today for UPT.   LMP: Pt states her LMP was 12/02/21. Sh has has been spotting light pink blood for 4 days this month but has concerns because this is not like her require period.      OBJECTIVE: Appears well, in no apparent distress.  OB History     Gravida  6   Para  2   Term  1   Preterm  1   AB  1   Living  1      SAB      IAB  1   Ectopic      Multiple  1   Live Births  1          Home UPT Result: Negative In-Office UPT result: Negative I have reviewed the patient's medical, obstetrical, social, and family histories, and medications.   ASSESSMENT: Positive pregnancy test  PLAN Pt advised to schedule appt for AEX to follow up with a provider for her abnormal cycle.

## 2022-01-04 NOTE — Progress Notes (Signed)
Agree with nurses's documentation of this patient's clinic encounter.  Ahmaud Duthie L, MD  

## 2022-01-24 ENCOUNTER — Ambulatory Visit (INDEPENDENT_AMBULATORY_CARE_PROVIDER_SITE_OTHER): Payer: BC Managed Care – PPO | Admitting: Adult Health

## 2022-01-24 ENCOUNTER — Ambulatory Visit: Payer: BC Managed Care – PPO | Admitting: Internal Medicine

## 2022-01-24 ENCOUNTER — Other Ambulatory Visit (HOSPITAL_COMMUNITY)
Admission: RE | Admit: 2022-01-24 | Discharge: 2022-01-24 | Disposition: A | Discharge status: Home / Self Care | Payer: BC Managed Care – PPO | Source: Ambulatory Visit | Attending: Adult Health | Admitting: Adult Health

## 2022-01-24 ENCOUNTER — Encounter: Payer: Self-pay | Admitting: Adult Health

## 2022-01-24 VITALS — BP 100/60 | HR 85 | Temp 98.1°F | Ht 67.0 in | Wt 147.0 lb

## 2022-01-24 DIAGNOSIS — Z113 Encounter for screening for infections with a predominantly sexual mode of transmission: Secondary | ICD-10-CM

## 2022-01-24 DIAGNOSIS — N390 Urinary tract infection, site not specified: Secondary | ICD-10-CM

## 2022-01-24 DIAGNOSIS — R319 Hematuria, unspecified: Secondary | ICD-10-CM | POA: Diagnosis not present

## 2022-01-24 DIAGNOSIS — R634 Abnormal weight loss: Secondary | ICD-10-CM

## 2022-01-24 DIAGNOSIS — R5383 Other fatigue: Secondary | ICD-10-CM

## 2022-01-24 LAB — POCT URINALYSIS DIPSTICK
Bilirubin, UA: NEGATIVE
Blood, UA: POSITIVE
Glucose, UA: NEGATIVE
Ketones, UA: NEGATIVE
Leukocytes, UA: NEGATIVE
Nitrite, UA: POSITIVE
Protein, UA: POSITIVE — AB
Spec Grav, UA: 1.02 (ref 1.010–1.025)
Urobilinogen, UA: 0.2 E.U./dL
pH, UA: 6.5 (ref 5.0–8.0)

## 2022-01-24 LAB — BASIC METABOLIC PANEL
BUN: 9 mg/dL (ref 6–23)
CO2: 29 mEq/L (ref 19–32)
Calcium: 9.3 mg/dL (ref 8.4–10.5)
Chloride: 104 mEq/L (ref 96–112)
Creatinine, Ser: 0.69 mg/dL (ref 0.40–1.20)
GFR: 116.93 mL/min (ref >60.00)
Glucose, Bld: 89 mg/dL (ref 70–99)
Potassium: 4 mEq/L (ref 3.5–5.1)
Sodium: 138 mEq/L (ref 135–145)

## 2022-01-24 LAB — CBC WITH DIFFERENTIAL/PLATELET
Basophils Absolute: 0 10*3/uL (ref 0.0–0.1)
Basophils Relative: 0.6 % (ref 0.0–3.0)
Eosinophils Absolute: 0.1 10*3/uL (ref 0.0–0.7)
Eosinophils Relative: 1.8 % (ref 0.0–5.0)
HCT: 42.8 % (ref 36.0–46.0)
Hemoglobin: 14.2 g/dL (ref 12.0–15.0)
Lymphocytes Relative: 30.8 % (ref 12.0–46.0)
Lymphs Abs: 2.5 10*3/uL (ref 0.7–4.0)
MCHC: 33.3 g/dL (ref 30.0–36.0)
MCV: 90.8 fl (ref 78.0–100.0)
Monocytes Absolute: 0.7 10*3/uL (ref 0.1–1.0)
Monocytes Relative: 8.2 % (ref 3.0–12.0)
Neutro Abs: 4.8 10*3/uL (ref 1.4–7.7)
Neutrophils Relative %: 58.6 % (ref 43.0–77.0)
Platelets: 295 10*3/uL (ref 150.0–400.0)
RBC: 4.71 Mil/uL (ref 3.87–5.11)
RDW: 13.5 % (ref 11.5–15.5)
WBC: 8.1 10*3/uL (ref 4.0–10.5)

## 2022-01-24 LAB — T4, FREE: Free T4: 0.88 ng/dL (ref 0.60–1.60)

## 2022-01-24 LAB — TSH: TSH: 1.18 u[IU]/mL (ref 0.35–5.50)

## 2022-01-24 MED ORDER — NITROFURANTOIN MONOHYD MACRO 100 MG PO CAPS: 100.0000 mg | ORAL_CAPSULE | Freq: Two times a day (BID) | ORAL | 0 refills | Status: AC

## 2022-01-24 NOTE — Progress Notes (Signed)
Subjective:    Patient ID: Cheryl Cohen, female    DOB: [birth year], 30 y.o.   MRN: [id number]  HPI 31 year old female who  has a past medical history of Anxiety, Bipolar 1 disorder ([place]), Depression, Gestational diabetes, Gonorrhea, varicella, Syphilis, and Twin pregnancy, delivered vaginally, IUFD stillborns (09/09/2015).  She is a patient of Dr. Regis Bill who I am seeing today for concern of exposure to STD as well as other issues she would like to discuss  She reports that she recently got back with her child's father and wants to make sure she is STD free.  She denies signs or symptoms of STDs but does report when they were not together he was in a different relationship.  He also reports that over the last week or so she has been experiencing discolored odorous urine, frequency, urgency and some lower pelvic pressure.  Denies dysuria or hematuria  Furthermore, she reports that over the last 4 months or so she has been having weight loss, lack of appetite, irregular periods and feeling fatigued.  Review of Systems See HPI   Past Medical History:  Diagnosis Date   Anxiety    Bipolar 1 disorder ([place])    hospitalized  3 x onset rx in 9th grade    Depression    Gestational diabetes    Gonorrhea    Hx of varicella    Syphilis    Twin pregnancy, delivered vaginally, IUFD stillborns 09/09/2015   IUFD  1 + weeks  still borns     Social History   Socioeconomic History   Marital status: Single    Spouse name: Not on file   Number of children: Not on file   Years of education: Not on file   Highest education level: Some college, no degree  Occupational History    Comment: Museum/gallery conservator on [employer]  Tobacco Use   Smoking status: Former    Packs/day: 0.20    Types: Cigarettes   Smokeless tobacco: Never   Tobacco comments:    1 cigarette a day  Vaping Use   Vaping Use: Never used  Substance and Sexual Activity   Alcohol use: No   Drug use: No    Sexual activity: Yes    Birth control/protection: None  Other Topics Concern   Not on file  Social History Narrative   hh of 4    Lives at home  [place] for [place]  In past    Worked also [employer]   Pet dog   Neg tad some caffiene       Now living at home  Had been with vine   Social Determinants of Health   Financial Resource Strain: Not on file  Food Insecurity: No Food Insecurity (08/25/2020)   Hunger Vital Sign    Worried About Running Out of Food in the Last Year: Never true    Ran Out of Food in the Last Year: Never true  Transportation Needs: Not on file  Physical Activity: Not on file  Stress: Not on file  Social Connections: Not on file  Intimate Partner Violence: Not on file    Past Surgical History:  Procedure Laterality Date   DILATION AND EVACUATION N/A 09/09/2015   Procedure: [place];  Surgeon: Cheri Fowler, MD;  Location: [facility];  Service: Gynecology;  Laterality: N/A;   TYMPANOSTOMY TUBE PLACEMENT     asage 2-3 yrs    Family  History  Problem Relation Age of Onset   Diabetes Mother    Arthritis Mother    Hypertension Father    Alcohol abuse Father    Alcoholism Father     Allergies  Allergen Reactions   Amoxicillin Rash and Other (See Comments)    Has patient had a PCN reaction causing immediate rash, facial/tongue/throat swelling, SOB or lightheadedness with hypotension: yes Has patient had a PCN reaction causing severe rash involving mucus membranes or skin necrosis: No Has patient had a PCN reaction that required hospitalization: No Has patient had a PCN reaction occurring within the last 10 years: Yes If all of the above answers are "NO", then may proceed with Cephalosporin use.     Current Outpatient Medications on File Prior to Visit  Medication Sig Dispense Refill   lithium carbonate (ESKALITH) 450 MG CR tablet Take 450 mg by mouth daily.     OLANZapine (ZYPREXA) 15 MG tablet Take 15 mg by mouth at  bedtime. (Patient not taking: Reported on 10/14/2021)     No current facility-administered medications on file prior to visit.    LMP 12/02/2021 Comment: Pt reports hving an abortion end of [id number]      Objective:   Physical Exam Vitals and nursing note reviewed.  Constitutional:      Appearance: Normal appearance.  Cardiovascular:     Rate and Rhythm: Normal rate and regular rhythm.     Pulses: Normal pulses.     Heart sounds: Normal heart sounds.  Pulmonary:     Effort: Pulmonary effort is normal.     Breath sounds: Normal breath sounds.  Abdominal:     General: Abdomen is flat. Bowel sounds are normal.     Palpations: Abdomen is soft.     Tenderness: There is abdominal tenderness. There is no right CVA tenderness or left CVA tenderness.  Musculoskeletal:        General: Normal range of motion.  Skin:    General: Skin is warm and dry.     Capillary Refill: Capillary refill takes less than 2 seconds.  Neurological:     General: No focal deficit present.     Mental Status: She is alert and oriented to person, place, and time.  Psychiatric:        Mood and Affect: Mood normal.        Behavior: Behavior normal.        Thought Content: Thought content normal.        Judgment: Judgment normal.       Assessment & Plan:  1. Screening for STD (sexually transmitted disease)  - HIV Antibody (routine testing w rflx); Future - RPR; Future - Urine cytology ancillary only; Future  2. Urinary tract infection with hematuria, site unspecified  - Culture, Urine - POC Urinalysis Dipstick + blood and nitrites.  - nitrofurantoin, macrocrystal-monohydrate, (MACROBID) 100 MG capsule; Take 1 capsule (100 mg total) by mouth 2 (two) times daily for 7 days.  Dispense: 14 capsule; Refill: 0  3. Weight loss  - TSH; Future - CBC with Differential/Platelet; Future - Basic Metabolic Panel; Future - T3, Free - T4, Free  4. Other fatigue  - TSH; Future - CBC with Differential/Platelet;  Future - Basic Metabolic Panel; Future - T3, Free - T4, Free  Dorothyann Peng, NP

## 2022-01-24 NOTE — Addendum Note (Signed)
Addended by: Rosalyn Gess D on: 01/24/2022 12:27 PM   Modules accepted: Orders

## 2022-01-24 NOTE — Addendum Note (Signed)
Addended by: Apolinar Junes on: 01/24/2022 12:16 PM   Modules accepted: Orders

## 2022-01-25 LAB — URINE CYTOLOGY ANCILLARY ONLY
Chlamydia: NEGATIVE
Comment: NEGATIVE
Comment: NEGATIVE
Comment: NORMAL
Neisseria Gonorrhea: NEGATIVE
Trichomonas: NEGATIVE

## 2022-01-25 LAB — HIV ANTIBODY (ROUTINE TESTING W REFLEX): HIV 1&2 Ab, 4th Generation: NONREACTIVE

## 2022-01-25 LAB — RPR: RPR Ser Ql: NONREACTIVE

## 2022-01-27 LAB — URINE CULTURE
MICRO NUMBER:: 14093149
SPECIMEN QUALITY:: ADEQUATE

## 2022-03-10 DIAGNOSIS — F310 Bipolar disorder, current episode hypomanic: Secondary | ICD-10-CM | POA: Diagnosis not present

## 2022-03-10 DIAGNOSIS — F909 Attention-deficit hyperactivity disorder, unspecified type: Secondary | ICD-10-CM | POA: Diagnosis not present

## 2022-05-24 ENCOUNTER — Ambulatory Visit (INDEPENDENT_AMBULATORY_CARE_PROVIDER_SITE_OTHER): Payer: BC Managed Care – PPO | Admitting: Obstetrics

## 2022-05-24 ENCOUNTER — Encounter: Payer: Self-pay | Admitting: Obstetrics

## 2022-05-24 ENCOUNTER — Other Ambulatory Visit (HOSPITAL_COMMUNITY)
Admission: RE | Admit: 2022-05-24 | Discharge: 2022-05-24 | Disposition: A | Discharge status: Home / Self Care | Payer: BC Managed Care – PPO | Source: Ambulatory Visit | Attending: Obstetrics | Admitting: Obstetrics

## 2022-05-24 VITALS — BP 105/68 | HR 91 | Ht 67.0 in | Wt 142.0 lb

## 2022-05-24 DIAGNOSIS — Z01419 Encounter for gynecological examination (general) (routine) without abnormal findings: Secondary | ICD-10-CM

## 2022-05-24 DIAGNOSIS — N898 Other specified noninflammatory disorders of vagina: Secondary | ICD-10-CM | POA: Insufficient documentation

## 2022-05-24 DIAGNOSIS — F17200 Nicotine dependence, unspecified, uncomplicated: Secondary | ICD-10-CM

## 2022-05-24 DIAGNOSIS — Z113 Encounter for screening for infections with a predominantly sexual mode of transmission: Secondary | ICD-10-CM | POA: Diagnosis not present

## 2022-05-24 NOTE — Progress Notes (Signed)
Subjective:        Cheryl Cohen is a 32 y.o. female here for a routine exam.  Current complaints: vaginal discharge.    Personal health questionnaire:  Is patient Ashkenazi Jewish, have a family history of breast and/or ovarian cancer: no Is there a family history of uterine cancer diagnosed at age < 89, gastrointestinal cancer, urinary tract cancer, family member who is a Field seismologist syndrome-associated carrier: no Is the patient overweight and hypertensive, family history of diabetes, personal history of gestational diabetes, preeclampsia or PCOS: no Is patient over 102, have PCOS,  family history of premature CHD under age 49, diabetes, smoke, have hypertension or peripheral artery disease:  no At any time, has a partner hit, kicked or otherwise hurt or frightened you?: no Over the past 2 weeks, have you felt down, depressed or hopeless?: no Over the past 2 weeks, have you felt little interest or pleasure in doing things?:no   Gynecologic History Patient's last menstrual period was 05/14/2022 (exact date). Contraception: none Last Pap: unknown. Results were: unknown Last mammogram: n/a. Results were: n/a  Obstetric History OB History  Gravida Para Term Preterm AB Living  6 2 1 1 1 1  $ SAB IAB Ectopic Multiple Live Births    1   1 1    $ # Outcome Date GA Lbr Len/2nd Weight Sex Delivery Anes PTL Lv  6 Gravida           5 Term 11/02/20 [redacted]w[redacted]d/ 00:19 7 lb 4.9 oz (3.314 kg) M Vag-Spont EPI  LIV  4 IAB 06/25/17 767w0d       3A Preterm 09/09/15 2533w5d:50 / 00:25 10.4 oz (0.295 kg) F Vag-Spont None  FD  3B Preterm 09/09/15 [id number] / 00:43 13.9 oz (0.394 kg) F Vag-Spont None  FD  2 [place]           [place]         Past Medical History:  Diagnosis Date   Anxiety    Bipolar 1 disorder (HCC)    hospitalized  3 x onset rx in 9th grade    Depression    Gestational diabetes    Gonorrhea    Hx of varicella    Syphilis    Twin pregnancy, delivered vaginally, IUFD  stillborns 09/09/2015   IUFD  22 +63eeks  still borns     Past Surgical History:  Procedure Laterality Date   DILATION AND EVACUATION N/A 09/09/2015   Procedure: DILATATION AND EVACUATION;  Surgeon: ToddCheri Fowler;  [facility];  Service: Gynecology;  Laterality: N/A;   TYMPANOSTOMY TUBE PLACEMENT     asage 2-3 yrs     Current Outpatient Medications:    lithium carbonate (ESKALITH) 450 MG CR tablet, Take 450 mg by mouth daily., Disp: , Rfl:    OLANZapine (ZYPREXA) 15 MG tablet, Take 15 mg by mouth at bedtime., Disp: , Rfl:  Allergies  Allergen Reactions   Amoxicillin Rash and Other (See Comments)    Has patient had a PCN reaction causing immediate rash, facial/tongue/throat swelling, SOB or lightheadedness with hypotension: yes Has patient had a PCN reaction causing severe rash involving mucus membranes or skin necrosis: No Has patient had a PCN reaction that required hospitalization: No Has patient had a PCN reaction occurring within the last 10 years: Yes If all of the above answers are "NO", then may proceed with Cephalosporin use.     Social History  Tobacco Use   Smoking status: Every Day    Packs/day: 0.20    Types: Cigarettes   Smokeless tobacco: Current   Tobacco comments:    1 cigarette a day  Substance Use Topics   Alcohol use: Yes    Family History  Problem Relation Age of Onset   Diabetes Mother    Arthritis Mother    Hypertension Father    Alcohol abuse Father    Alcoholism Father       Review of Systems  Constitutional: negative for fatigue and weight loss Respiratory: negative for cough and wheezing Cardiovascular: negative for chest pain, fatigue and palpitations Gastrointestinal: negative for abdominal pain and change in bowel habits Musculoskeletal:negative for myalgias Neurological: negative for gait problems and tremors Behavioral/Psych: negative for abusive relationship, depression Endocrine: negative for temperature intolerance     Genitourinary: positive for vaginal discharge.  negative for abnormal menstrual periods, genital lesions, hot flashes, sexual problems  Integument/breast: negative for breast lump, breast tenderness, nipple discharge and skin lesion(s)    Objective:       BP 105/68   Pulse 91   Ht 5' 7"$  (1.702 m)   Wt 142 lb (64.4 kg)   LMP 05/14/2022 (Exact Date) Comment: Pt reports hving an abortion end of [id number]  BMI 22.24 kg/m  General:   Alert and no distress  Skin:   no rash or abnormalities  Lungs:   clear to auscultation bilaterally  Heart:   regular rate and rhythm, S1, S2 normal, no murmur, click, rub or gallop  Breasts:   normal without suspicious masses, skin or nipple changes or axillary nodes  Abdomen:  normal findings: no organomegaly, soft, non-tender and no hernia  Pelvis:  External genitalia: normal general appearance Urinary system: urethral meatus normal and bladder without fullness, nontender Vaginal: normal without tenderness, induration or masses Cervix: normal appearance Adnexa: normal bimanual exam Uterus: anteverted and non-tender, normal size   Lab Review Urine pregnancy test Labs reviewed yes Radiologic studies reviewed no  I have spent a total of 20 minutes of face-to-face ime, excluding clinical staff time, reviewing notes and preparing to see patient, ordering tests and/or medications, and counseling the patient.   Assessment:    Healthy female exam.    Plan:    Education reviewed: calcium supplements, depression evaluation, low fat, low cholesterol diet, safe sex/STD prevention, self breast exams, smoking cessation, and weight bearing exercise. Contraception: none. Follow up in: 1 year.   No orders of the defined types were placed in this encounter.  Orders Placed This Encounter  Procedures   Hepatitis C Antibody    Shelly Bombard, MD 05/24/2022 2:16 PM

## 2022-05-24 NOTE — Progress Notes (Signed)
32 y.o GYN presents for AEX/PAP/STD screening.  C/o throwing up just before her period, vaginal discharge and odor.

## 2022-05-25 ENCOUNTER — Other Ambulatory Visit: Payer: Self-pay | Admitting: Obstetrics

## 2022-05-25 DIAGNOSIS — N760 Acute vaginitis: Secondary | ICD-10-CM

## 2022-05-25 LAB — CERVICOVAGINAL ANCILLARY ONLY
Bacterial Vaginitis (gardnerella): POSITIVE — AB
Candida Glabrata: NEGATIVE
Candida Vaginitis: NEGATIVE
Chlamydia: NEGATIVE
Comment: NEGATIVE
Comment: NEGATIVE
Comment: NEGATIVE
Comment: NEGATIVE
Comment: NEGATIVE
Comment: NORMAL
Neisseria Gonorrhea: NEGATIVE
Trichomonas: NEGATIVE

## 2022-05-25 LAB — HEPATITIS C ANTIBODY: Hep C Virus Ab: NONREACTIVE

## 2022-05-25 MED ORDER — METRONIDAZOLE 500 MG PO TABS: 500.0000 mg | ORAL_TABLET | Freq: Two times a day (BID) | ORAL | 2 refills | Status: DC

## 2022-05-26 LAB — CYTOLOGY - PAP
Comment: NEGATIVE
Diagnosis: NEGATIVE
High risk HPV: NEGATIVE

## 2022-06-05 ENCOUNTER — Ambulatory Visit: Payer: BC Managed Care – PPO | Admitting: Family Medicine

## 2022-06-07 DIAGNOSIS — F909 Attention-deficit hyperactivity disorder, unspecified type: Secondary | ICD-10-CM | POA: Diagnosis not present

## 2022-06-07 DIAGNOSIS — F310 Bipolar disorder, current episode hypomanic: Secondary | ICD-10-CM | POA: Diagnosis not present

## 2022-06-08 ENCOUNTER — Encounter: Payer: Self-pay | Admitting: Obstetrics

## 2022-06-08 ENCOUNTER — Ambulatory Visit (INDEPENDENT_AMBULATORY_CARE_PROVIDER_SITE_OTHER): Payer: BC Managed Care – PPO | Admitting: Obstetrics

## 2022-06-08 VITALS — BP 102/69 | HR 83 | Ht 67.0 in | Wt 139.0 lb

## 2022-06-08 DIAGNOSIS — Z3169 Encounter for other general counseling and advice on procreation: Secondary | ICD-10-CM | POA: Diagnosis not present

## 2022-06-08 MED ORDER — PNV-DHA+DOCUSATE 27-1.25-300 MG PO CAPS: 1.0000 | ORAL_CAPSULE | Freq: Every day | ORAL | 4 refills | Status: DC

## 2022-06-08 NOTE — Progress Notes (Addendum)
32 y.o GYN presents for Infertility testing.  Pt has been trying for 2 months to get pregnant.  Pt also wants to know where her partner can get a sperm count test.  Last PAP 05/24/2022

## 2022-06-08 NOTE — Progress Notes (Signed)
Patient ID: Cheryl Cohen, female   DOB: April 09, 1990, 32 y.o.   MRN: [id number]  Chief Complaint  Patient presents with   Infertility    HPI Cheryl Cohen is a 32 y.o. female.  Has been trying to conceive for the pats 2-3 months without success.  Wants to know if any tests need to be done on her or her partner.  She has no other complaints, HPI  Past Medical History:  Diagnosis Date   Anxiety    Bipolar 1 disorder (HCC)    hospitalized  3 x onset rx in 9th grade    Depression    Gestational diabetes    Gonorrhea    Hx of varicella    Syphilis    Twin pregnancy, delivered vaginally, IUFD stillborns 09/09/2015   IUFD  65 + weeks  still borns     Past Surgical History:  Procedure Laterality Date   DILATION AND EVACUATION N/A 09/09/2015   Procedure: DILATATION AND EVACUATION;  Surgeon: Cheri Fowler, MD;  Location: [facility];  Service: Gynecology;  Laterality: N/A;   TYMPANOSTOMY TUBE PLACEMENT     asage 2-3 yrs    Family History  Problem Relation Age of Onset   Diabetes Mother    Arthritis Mother    Hypertension Father    Alcohol abuse Father    Alcoholism Father     Social History Social History   Tobacco Use   Smoking status: Every Day    Packs/day: 0.20    Types: Cigarettes   Smokeless tobacco: Former   Tobacco comments:    1 cigarette a day  Vaping Use   Vaping Use: Never used  Substance Use Topics   Alcohol use: Yes   Drug use: Yes    Types: Marijuana    Allergies  Allergen Reactions   Amoxicillin Rash and Other (See Comments)    Has patient had a PCN reaction causing immediate rash, facial/tongue/throat swelling, SOB or lightheadedness with hypotension: yes Has patient had a PCN reaction causing severe rash involving mucus membranes or skin necrosis: No Has patient had a PCN reaction that required hospitalization: No Has patient had a PCN reaction occurring within the last 10 years: Yes If all of the above answers are "NO", then may proceed  with Cephalosporin use.     Current Outpatient Medications  Medication Sig Dispense Refill   lithium carbonate (ESKALITH) 450 MG CR tablet Take 450 mg by mouth daily.     metroNIDAZOLE (FLAGYL) 500 MG tablet Take 1 tablet (500 mg total) by mouth 2 (two) times daily. 14 tablet 2   OLANZapine (ZYPREXA) 15 MG tablet Take 15 mg by mouth at bedtime. (Patient not taking: Reported on 06/08/2022)     No current facility-administered medications for this visit.    Review of Systems Review of Systems Constitutional: negative for fatigue and weight loss Respiratory: negative for cough and wheezing Cardiovascular: negative for chest pain, fatigue and palpitations Gastrointestinal: negative for abdominal pain and change in bowel habits Genitourinary:negative Integument/breast: negative for nipple discharge Musculoskeletal:negative for myalgias Neurological: negative for gait problems and tremors Behavioral/Psych: negative for abusive relationship, depression Endocrine: negative for temperature intolerance      Blood pressure 102/69, pulse 83, height '5\' 7"'$  (1.702 m), weight 139 lb (63 kg), last menstrual period 06/06/2022, unknown if currently breastfeeding.  Physical Exam Physical Exam General:   Alert and no distress  Skin:   no rash or abnormalities  Lungs:   clear to auscultation bilaterally  Heart:   regular rate and rhythm, S1, S2 normal, no murmur, click, rub or gallop  Pelvic Exam:  Deferred  I have spent a total of 15 minutes of face-to-face time, excluding clinical staff time, reviewing notes and preparing to see patient, ordering tests and/or medications, and counseling the patient.   Data Reviewed Labs  Assessment     1. Encounter for preconception consultation - importance of taking folic acid prior to conception discussed, along with eating a healthy diet and maintaining a healthy lifestyle Rx: - Prenat-FeFum-DSS-FA-DHA w/o A (PNV-DHA+DOCUSATE) 27-1.25-300 MG CAPS; Take 1  capsule by mouth daily before breakfast.  Dispense: 90 capsule; Refill: 4     Plan   Follow up prn   Shelly Bombard, MD 06/08/2022 11:39 AM

## 2022-07-06 ENCOUNTER — Ambulatory Visit (INDEPENDENT_AMBULATORY_CARE_PROVIDER_SITE_OTHER): Payer: BC Managed Care – PPO | Admitting: Family Medicine

## 2022-07-06 ENCOUNTER — Encounter: Payer: Self-pay | Admitting: Family Medicine

## 2022-07-06 VITALS — BP 110/80 | Temp 98.5°F | Wt 134.8 lb

## 2022-07-06 DIAGNOSIS — R634 Abnormal weight loss: Secondary | ICD-10-CM | POA: Diagnosis not present

## 2022-07-06 DIAGNOSIS — R1084 Generalized abdominal pain: Secondary | ICD-10-CM

## 2022-07-06 LAB — CBC WITH DIFFERENTIAL/PLATELET
Basophils Absolute: 0 10*3/uL (ref 0.0–0.1)
Basophils Relative: 0.3 % (ref 0.0–3.0)
Eosinophils Absolute: 0.1 10*3/uL (ref 0.0–0.7)
Eosinophils Relative: 1.8 % (ref 0.0–5.0)
HCT: 43.9 % (ref 36.0–46.0)
Hemoglobin: 15.1 g/dL — ABNORMAL HIGH (ref 12.0–15.0)
Lymphocytes Relative: 38.3 % (ref 12.0–46.0)
Lymphs Abs: 3 10*3/uL (ref 0.7–4.0)
MCHC: 34.4 g/dL (ref 30.0–36.0)
MCV: 91.9 fl (ref 78.0–100.0)
Monocytes Absolute: 0.9 10*3/uL (ref 0.1–1.0)
Monocytes Relative: 11.8 % (ref 3.0–12.0)
Neutro Abs: 3.7 10*3/uL (ref 1.4–7.7)
Neutrophils Relative %: 47.8 % (ref 43.0–77.0)
Platelets: 314 10*3/uL (ref 150.0–400.0)
RBC: 4.78 Mil/uL (ref 3.87–5.11)
RDW: 13.4 % (ref 11.5–15.5)
WBC: 7.7 10*3/uL (ref 4.0–10.5)

## 2022-07-06 LAB — COMPREHENSIVE METABOLIC PANEL
ALT: 11 U/L (ref 0–35)
AST: 15 U/L (ref 0–37)
Albumin: 4.8 g/dL (ref 3.5–5.2)
Alkaline Phosphatase: 51 U/L (ref 39–117)
BUN: 10 mg/dL (ref 6–23)
CO2: 29 mEq/L (ref 19–32)
Calcium: 10.1 mg/dL (ref 8.4–10.5)
Chloride: 103 mEq/L (ref 96–112)
Creatinine, Ser: 0.8 mg/dL (ref 0.40–1.20)
GFR: 98.96 mL/min (ref >60.00)
Glucose, Bld: 96 mg/dL (ref 70–99)
Potassium: 4 mEq/L (ref 3.5–5.1)
Sodium: 139 mEq/L (ref 135–145)
Total Bilirubin: 0.7 mg/dL (ref 0.2–1.2)
Total Protein: 7.9 g/dL (ref 6.0–8.3)

## 2022-07-06 LAB — T4, FREE: Free T4: 1.17 ng/dL (ref 0.60–1.60)

## 2022-07-06 LAB — TSH: TSH: 0.81 u[IU]/mL (ref 0.35–5.50)

## 2022-07-06 NOTE — Progress Notes (Signed)
   Established Patient Office Visit   Subjective  Patient ID: Cheryl Cohen, female    DOB: May 23, 1990  Age: 32 y.o. MRN: [id number]  Chief Complaint  Patient presents with   Weight Loss    Patient is a 32 year old female followed by Dr. Regis Bill and seen for ongoing concern.  Patient endorses unintentional weight loss over the last few months.  Previously 149 pounds, now 134.  Patient denies changes in medications, diet.  Patient endorses some stress but less than in the past.  Patient with generalized abdominal pain.  Denies issues with constipation, nausea, vomiting, irregular menses.  LMP 3/29.  Menses lighter in the last 3-4 months than previous cycles.  At-home urine pregnancy negative.  Previously seen in clinic for similar symptoms.  Workup negative at that time.      ROS Negative unless stated above    Objective:     BP 110/80 (BP Location: Right Arm, Patient Position: Sitting, Cuff Size: Normal)   Temp 98.5 F (36.9 C) (Oral)   Wt 134 lb 12.8 oz (61.1 kg)   LMP 06/30/2022 Comment: Pt reports hving an abortion end of [id number]  BMI 21.11 kg/m  Wt Readings from Last 3 Encounters:  07/06/22 134 lb 12.8 oz (61.1 kg)  06/08/22 139 lb (63 kg)  05/24/22 142 lb (64.4 kg)      Physical Exam Constitutional:      General: She is not in acute distress.    Appearance: Normal appearance.  HENT:     Head: Normocephalic and atraumatic.     Nose: Nose normal.     Mouth/Throat:     Mouth: Mucous membranes are moist.  Cardiovascular:     Rate and Rhythm: Normal rate and regular rhythm.     Heart sounds: Normal heart sounds. No murmur heard.    No gallop.  Pulmonary:     Effort: Pulmonary effort is normal. No respiratory distress.     Breath sounds: Normal breath sounds. No wheezing, rhonchi or rales.  Abdominal:     General: Bowel sounds are normal. There is no distension.     Palpations: Abdomen is soft. There is no mass.     Tenderness: There is generalized abdominal  tenderness. There is no guarding.  Skin:    General: Skin is warm and dry.  Neurological:     Mental Status: She is alert and oriented to person, place, and time.      No results found for any visits on 07/06/22.    Assessment & Plan:  Weight loss -Weight today 134.8 pounds.  Previously 147 on 01/24/2022. -Body mass index is 21.11 kg/m. -     TSH -     T4, free -     Comprehensive metabolic panel -     CBC with Differential/Platelet -     CT ABDOMEN PELVIS W CONTRAST; Future -     HIV Antibody (routine testing w rflx)  Generalized abdominal pain -     TSH -     T4, free -     Comprehensive metabolic panel -     CBC with Differential/Platelet -     CT ABDOMEN PELVIS W CONTRAST; Future  Discussed possible causes of unintentional weight loss.  Will repeat labs and obtain imaging given continued symptoms.  Recent labs from 05/24/22 with OB/GYN reviewed including Hep C, pap with ancillary.  Return if symptoms worsen or fail to improve.   Billie Ruddy, MD

## 2022-07-06 NOTE — Patient Instructions (Signed)
Orders for labs and imaging were placed.  You should expect a phone call about scheduling the imaging appointment.

## 2022-07-07 LAB — HIV ANTIBODY (ROUTINE TESTING W REFLEX): HIV 1&2 Ab, 4th Generation: NONREACTIVE

## 2022-07-11 ENCOUNTER — Encounter (HOSPITAL_BASED_OUTPATIENT_CLINIC_OR_DEPARTMENT_OTHER): Payer: Self-pay

## 2022-07-11 ENCOUNTER — Ambulatory Visit (HOSPITAL_BASED_OUTPATIENT_CLINIC_OR_DEPARTMENT_OTHER)
Admission: RE | Admit: 2022-07-11 | Discharge: 2022-07-11 | Disposition: A | Discharge status: Home / Self Care | Payer: BC Managed Care – PPO | Source: Ambulatory Visit | Attending: Family Medicine | Admitting: Family Medicine

## 2022-07-11 DIAGNOSIS — R1084 Generalized abdominal pain: Secondary | ICD-10-CM

## 2022-07-11 DIAGNOSIS — R634 Abnormal weight loss: Secondary | ICD-10-CM

## 2022-07-11 MED ORDER — IOHEXOL 300 MG/ML  SOLN
100.0000 mL | Freq: Once | INTRAMUSCULAR | Status: AC | PRN
  Administered 2022-07-11: 75 mL via INTRAVENOUS

## 2022-07-12 ENCOUNTER — Telehealth: Payer: Self-pay | Admitting: Internal Medicine

## 2022-07-12 NOTE — Telephone Encounter (Signed)
Forwarding  to Dr Salomon Fick

## 2022-07-12 NOTE — Telephone Encounter (Signed)
Called to report CT scan could not be completed yesterday because patient hadn't had a recent pregnancy test. CT ordered by [facility]

## 2022-07-12 NOTE — Telephone Encounter (Signed)
Please Advise next steps

## 2022-07-13 ENCOUNTER — Encounter (HOSPITAL_COMMUNITY): Payer: Self-pay

## 2022-07-13 ENCOUNTER — Ambulatory Visit (HOSPITAL_COMMUNITY)
Admission: EM | Admit: 2022-07-13 | Discharge: 2022-07-13 | Disposition: A | Discharge status: Home / Self Care | Payer: BC Managed Care – PPO | Attending: Emergency Medicine | Admitting: Emergency Medicine

## 2022-07-13 DIAGNOSIS — Z3202 Encounter for pregnancy test, result negative: Secondary | ICD-10-CM | POA: Insufficient documentation

## 2022-07-13 LAB — POCT URINALYSIS DIPSTICK, ED / UC
Bilirubin Urine: NEGATIVE
Glucose, UA: NEGATIVE mg/dL
Hgb urine dipstick: NEGATIVE
Leukocytes,Ua: NEGATIVE
Nitrite: NEGATIVE
Protein, ur: NEGATIVE mg/dL
Specific Gravity, Urine: 1.025 (ref 1.005–1.030)
Urobilinogen, UA: 0.2 mg/dL (ref 0.0–1.0)
pH: 6 (ref 5.0–8.0)

## 2022-07-13 LAB — HCG, QUANTITATIVE, PREGNANCY: hCG, Beta Chain, Quant, S: <1 m[IU]/mL (ref <5)

## 2022-07-13 LAB — POC URINE PREG, ED: Preg Test, Ur: NEGATIVE

## 2022-07-13 NOTE — ED Provider Notes (Signed)
[facility]    CSN: [id number] Arrival date & time: 07/13/22  1421     History   Chief Complaint Chief Complaint  Patient presents with   Abdominal Pain    HPI Cheryl Cohen is a 32 y.o. female.  Has been having about a month of lower abdominal discomfort, nausea in the morning, odd food cravings.  She was worried she was pregnant.  Unfortunate history of fetal demise Denies possibility of STD. Had BV in February and was treated.   She was seen by her primary care @ [facility] on 4/4 who recommended a CAT scan of the belly.  When she went to get the imaging they advised her if any possibility of pregnancy she shouldn't get the scan. She is here today to confirm negative pregnancy through a blood test.  LMP 3/29   Past Medical History:  Diagnosis Date   Anxiety    Bipolar 1 disorder    hospitalized  3 x onset rx in 9th grade    Depression    Gestational diabetes    Gonorrhea    Hx of varicella    Syphilis    Twin pregnancy, delivered vaginally, IUFD stillborns 09/09/2015   IUFD  22 + weeks  still borns     Patient Active Problem List   Diagnosis Date Noted   Abnormal cervical Papanicolaou smear 10/14/2021   Gestational diabetes mellitus (GDM), antepartum 08/25/2020   Psychotic disorder with delusions    Bipolar affective disorder, mixed, severe 10/04/2016   Fetal demise, greater than 22 weeks, antepartum 09/08/2015   Suicidal ideation 10/06/2013   Family history of diabetes mellitus type II 01/11/2012    Past Surgical History:  Procedure Laterality Date   DILATION AND EVACUATION N/A 09/09/2015   Procedure: DILATATION AND EVACUATION;  Surgeon: [name], MD;  Location: [facility];  Service: Gynecology;  Laterality: N/A;   TYMPANOSTOMY TUBE PLACEMENT     asage 2-3 yrs    OB History     Gravida  6   Para  2   Term  1   Preterm  1   AB  1   Living  1      SAB      IAB  1   Ectopic      Multiple  1   Live Births  1             Home Medications    Prior to Admission medications   Medication Sig Start Date End Date Taking? Authorizing Provider  lithium carbonate (ESKALITH) 450 MG CR tablet Take 450 mg by mouth daily.    [provider]    Family History Family History  Problem Relation Age of Onset   Diabetes Mother    Arthritis Mother    Hypertension Father    Alcohol abuse Father    Alcoholism Father     Social History Social History   Tobacco Use   Smoking status: Every Day    Types: Cigars   Smokeless tobacco: Former   Tobacco comments:    1 cigarette a day  Vaping Use   Vaping Use: Never used  Substance Use Topics   Alcohol use: Yes   Drug use: Yes    Types: Marijuana     Allergies   Amoxicillin   Review of Systems Review of Systems As per HPI  Physical Exam Triage Vital Signs ED Triage Vitals [07/13/22 1501]  Enc Vitals Group     BP 108/72  Pulse Rate 80     Resp 14     Temp 97.9 F (36.6 C)     Temp Source Oral     SpO2 95 %     Weight      Height      Head Circumference      Peak Flow      Pain Score 0     Pain Loc      Pain Edu?      Excl. in GC?    No data found.  Updated Vital Signs BP 108/72 (BP Location: Right Arm)   Pulse 80   Temp 97.9 F (36.6 C) (Oral)   Resp 14   LMP 06/30/2022 Comment: Pt reports hving an abortion end of May2023  SpO2 95%    Physical Exam Vitals and nursing note reviewed.  Constitutional:      General: She is not in acute distress.    Appearance: Normal appearance.  HENT:     Mouth/Throat:     Pharynx: Oropharynx is clear.  Cardiovascular:     Rate and Rhythm: Normal rate and regular rhythm.     Pulses: Normal pulses.     Heart sounds: Normal heart sounds.  Pulmonary:     Effort: Pulmonary effort is normal.     Breath sounds: Normal breath sounds.  Abdominal:     General: There is no distension.     Palpations: Abdomen is soft. There is no mass.     Tenderness: There is no abdominal tenderness.  There is no guarding or rebound.  Neurological:     Mental Status: She is alert and oriented to person, place, and time.     UC Treatments / Results  Labs (all labs ordered are listed, but only abnormal results are displayed) Labs Reviewed  POCT URINALYSIS DIPSTICK, ED / UC - Abnormal; Notable for the following components:      Result Value   Ketones, ur TRACE (*)    All other components within normal limits  HCG, QUANTITATIVE, PREGNANCY  POC URINE PREG, ED    EKG   Radiology No results found.  Procedures Procedures (including critical care time)  Medications Ordered in UC Medications - No data to display  Initial Impression / Assessment and Plan / UC Course  I have reviewed the triage vital signs and the nursing notes.  Pertinent labs & imaging results that were available during my care of the patient were reviewed by me and considered in my medical decision making (see chart for details).  UPT negative Per patient request will send off quant hCG and notify if abnormal She will follow with her PCP and OB/GYN regarding symptoms and will have the CT done Return precautions discussed. Patient agrees to plan  Final Clinical Impressions(s) / UC Diagnoses   Final diagnoses:  Pregnancy test negative     Discharge Instructions      I will call you if your blood test indicates any possibility of pregnancy.  If you do not hear from me today or tomorrow, the test was negative.  Please follow-up with your primary care and OB/GYN regarding your symptoms.  I recommend to have that CT scan done as well.    ED Prescriptions   None    PDMP not reviewed this encounter.   Ehtan Delfavero, Lurena Joiner, [place] 07/13/22 1648

## 2022-07-13 NOTE — Discharge Instructions (Addendum)
I will call you if your blood test indicates any possibility of pregnancy.  If you do not hear from me today or tomorrow, the test was negative.  Please follow-up with your primary care and OB/GYN regarding your symptoms.  I recommend to have that CT scan done as well.

## 2022-07-13 NOTE — ED Triage Notes (Signed)
Patient reports that she has been having pain from her vagina to the mid lower abdomen. Patient states pain is worse with movement.  Patient states she had a negative urine pregnancy test 2 months ago. Patient states that she has been having nausea in the mornings and urinary frequency.Marland Kitchen

## 2022-07-14 NOTE — Telephone Encounter (Signed)
Pt can schedule lab appt for UhCG.

## 2022-07-17 ENCOUNTER — Emergency Department (HOSPITAL_COMMUNITY)
Admission: EM | Admit: 2022-07-17 | Discharge: 2022-07-17 | Disposition: A | Discharge status: Home / Self Care | Payer: BC Managed Care – PPO | Source: Home / Self Care | Attending: Emergency Medicine | Admitting: Emergency Medicine

## 2022-07-17 ENCOUNTER — Encounter (HOSPITAL_COMMUNITY): Payer: Self-pay | Admitting: Registered Nurse

## 2022-07-17 ENCOUNTER — Ambulatory Visit (HOSPITAL_COMMUNITY)
Admission: EM | Admit: 2022-07-17 | Discharge: 2022-07-18 | Disposition: A | Discharge status: Other Acute Inpatient Hospital | Payer: Medicaid Other | Attending: Registered Nurse | Admitting: Registered Nurse

## 2022-07-17 ENCOUNTER — Ambulatory Visit: Payer: BC Managed Care – PPO | Admitting: Family Medicine

## 2022-07-17 ENCOUNTER — Other Ambulatory Visit: Payer: Self-pay

## 2022-07-17 DIAGNOSIS — F4310 Post-traumatic stress disorder, unspecified: Secondary | ICD-10-CM | POA: Diagnosis not present

## 2022-07-17 DIAGNOSIS — Z91410 Personal history of adult physical and sexual abuse: Secondary | ICD-10-CM | POA: Diagnosis not present

## 2022-07-17 DIAGNOSIS — F312 Bipolar disorder, current episode manic severe with psychotic features: Secondary | ICD-10-CM | POA: Diagnosis not present

## 2022-07-17 DIAGNOSIS — F310 Bipolar disorder, current episode hypomanic: Secondary | ICD-10-CM | POA: Diagnosis not present

## 2022-07-17 DIAGNOSIS — Z1152 Encounter for screening for COVID-19: Secondary | ICD-10-CM | POA: Diagnosis not present

## 2022-07-17 DIAGNOSIS — R000 Tachycardia, unspecified: Secondary | ICD-10-CM | POA: Diagnosis not present

## 2022-07-17 DIAGNOSIS — F1220 Cannabis dependence, uncomplicated: Secondary | ICD-10-CM | POA: Diagnosis not present

## 2022-07-17 DIAGNOSIS — F3164 Bipolar disorder, current episode mixed, severe, with psychotic features: Secondary | ICD-10-CM | POA: Diagnosis not present

## 2022-07-17 DIAGNOSIS — F411 Generalized anxiety disorder: Secondary | ICD-10-CM | POA: Diagnosis not present

## 2022-07-17 DIAGNOSIS — Z811 Family history of alcohol abuse and dependence: Secondary | ICD-10-CM | POA: Diagnosis not present

## 2022-07-17 DIAGNOSIS — Z79899 Other long term (current) drug therapy: Secondary | ICD-10-CM | POA: Diagnosis not present

## 2022-07-17 DIAGNOSIS — F319 Bipolar disorder, unspecified: Secondary | ICD-10-CM | POA: Insufficient documentation

## 2022-07-17 DIAGNOSIS — F17290 Nicotine dependence, other tobacco product, uncomplicated: Secondary | ICD-10-CM | POA: Diagnosis not present

## 2022-07-17 DIAGNOSIS — Z91148 Patient's other noncompliance with medication regimen for other reason: Secondary | ICD-10-CM | POA: Diagnosis not present

## 2022-07-17 DIAGNOSIS — K5900 Constipation, unspecified: Secondary | ICD-10-CM | POA: Diagnosis not present

## 2022-07-17 DIAGNOSIS — G4700 Insomnia, unspecified: Secondary | ICD-10-CM | POA: Diagnosis not present

## 2022-07-17 DIAGNOSIS — Z881 Allergy status to other antibiotic agents status: Secondary | ICD-10-CM | POA: Diagnosis not present

## 2022-07-17 DIAGNOSIS — F17210 Nicotine dependence, cigarettes, uncomplicated: Secondary | ICD-10-CM | POA: Diagnosis not present

## 2022-07-17 LAB — CBC WITH DIFFERENTIAL/PLATELET
Abs Immature Granulocytes: 0.02 10*3/uL (ref 0.00–0.07)
Abs Immature Granulocytes: 0.02 10*3/uL (ref 0.00–0.07)
Basophils Absolute: 0.1 10*3/uL (ref 0.0–0.1)
Basophils Absolute: 0 10*3/uL (ref 0.0–0.1)
Basophils Relative: 1 %
Basophils Relative: 1 %
Eosinophils Absolute: 0.2 10*3/uL (ref 0.0–0.5)
Eosinophils Absolute: 0.2 10*3/uL (ref 0.0–0.5)
Eosinophils Relative: 2 %
Eosinophils Relative: 2 %
HCT: 39.2 % (ref 36.0–46.0)
HCT: 39.5 % (ref 36.0–46.0)
Hemoglobin: 13.4 g/dL (ref 12.0–15.0)
Hemoglobin: 13.9 g/dL (ref 12.0–15.0)
Immature Granulocytes: 0 %
Immature Granulocytes: 0 %
Lymphocytes Relative: 41 %
Lymphocytes Relative: 41 %
Lymphs Abs: 3.9 10*3/uL (ref 0.7–4.0)
Lymphs Abs: 3.4 10*3/uL (ref 0.7–4.0)
MCH: 31.3 pg (ref 26.0–34.0)
MCH: 32 pg (ref 26.0–34.0)
MCHC: 34.2 g/dL (ref 30.0–36.0)
MCHC: 35.2 g/dL (ref 30.0–36.0)
MCV: 91.6 fL (ref 80.0–100.0)
MCV: 91 fL (ref 80.0–100.0)
Monocytes Absolute: 0.9 10*3/uL (ref 0.1–1.0)
Monocytes Absolute: 0.9 10*3/uL (ref 0.1–1.0)
Monocytes Relative: 10 %
Monocytes Relative: 11 %
Neutro Abs: 4.5 10*3/uL (ref 1.7–7.7)
Neutro Abs: 3.8 10*3/uL (ref 1.7–7.7)
Neutrophils Relative %: 46 %
Neutrophils Relative %: 45 %
Platelets: 285 10*3/uL (ref 150–400)
Platelets: 282 10*3/uL (ref 150–400)
RBC: 4.28 MIL/uL (ref 3.87–5.11)
RBC: 4.34 MIL/uL (ref 3.87–5.11)
RDW: 13 % (ref 11.5–15.5)
RDW: 13 % (ref 11.5–15.5)
WBC: 9.6 10*3/uL (ref 4.0–10.5)
WBC: 8.4 10*3/uL (ref 4.0–10.5)
nRBC: 0 % (ref 0.0–0.2)
nRBC: 0 % (ref 0.0–0.2)

## 2022-07-17 LAB — URINALYSIS, ROUTINE W REFLEX MICROSCOPIC
Bilirubin Urine: NEGATIVE
Glucose, UA: NEGATIVE mg/dL
Hgb urine dipstick: NEGATIVE
Ketones, ur: NEGATIVE mg/dL
Leukocytes,Ua: NEGATIVE
Nitrite: NEGATIVE
Protein, ur: NEGATIVE mg/dL
Specific Gravity, Urine: 1.009 (ref 1.005–1.030)
pH: 8 (ref 5.0–8.0)

## 2022-07-17 LAB — LIPID PANEL
Cholesterol: 134 mg/dL (ref 0–200)
HDL: 70 mg/dL (ref >40)
LDL Cholesterol: 57 mg/dL (ref 0–99)
Total CHOL/HDL Ratio: 1.9 RATIO
Triglycerides: 37 mg/dL (ref <150)
VLDL: 7 mg/dL (ref 0–40)

## 2022-07-17 LAB — COMPREHENSIVE METABOLIC PANEL
ALT: 20 U/L (ref 0–44)
ALT: 25 U/L (ref 0–44)
AST: 29 U/L (ref 15–41)
AST: 30 U/L (ref 15–41)
Albumin: 4.1 g/dL (ref 3.5–5.0)
Albumin: 4 g/dL (ref 3.5–5.0)
Alkaline Phosphatase: 51 U/L (ref 38–126)
Alkaline Phosphatase: 46 U/L (ref 38–126)
Anion gap: 9 (ref 5–15)
Anion gap: 8 (ref 5–15)
BUN: 10 mg/dL (ref 6–20)
BUN: 6 mg/dL (ref 6–20)
CO2: 22 mmol/L (ref 22–32)
CO2: 25 mmol/L (ref 22–32)
Calcium: 8.6 mg/dL — ABNORMAL LOW (ref 8.9–10.3)
Calcium: 9.1 mg/dL (ref 8.9–10.3)
Chloride: 102 mmol/L (ref 98–111)
Chloride: 103 mmol/L (ref 98–111)
Creatinine, Ser: 0.67 mg/dL (ref 0.44–1.00)
Creatinine, Ser: 0.73 mg/dL (ref 0.44–1.00)
GFR, Estimated: >60 mL/min (ref >60)
GFR, Estimated: >60 mL/min (ref >60)
Glucose, Bld: 119 mg/dL — ABNORMAL HIGH (ref 70–99)
Glucose, Bld: 96 mg/dL (ref 70–99)
Potassium: 3.2 mmol/L — ABNORMAL LOW (ref 3.5–5.1)
Potassium: 3.5 mmol/L (ref 3.5–5.1)
Sodium: 133 mmol/L — ABNORMAL LOW (ref 135–145)
Sodium: 136 mmol/L (ref 135–145)
Total Bilirubin: 1.2 mg/dL (ref 0.3–1.2)
Total Bilirubin: 1.3 mg/dL — ABNORMAL HIGH (ref 0.3–1.2)
Total Protein: 7.2 g/dL (ref 6.5–8.1)
Total Protein: 6.9 g/dL (ref 6.5–8.1)

## 2022-07-17 LAB — POCT URINE DRUG SCREEN - MANUAL ENTRY (I-SCREEN)
POC Amphetamine UR: NOT DETECTED
POC Buprenorphine (BUP): NOT DETECTED
POC Cocaine UR: NOT DETECTED
POC Marijuana UR: POSITIVE — AB
POC Methadone UR: NOT DETECTED
POC Methamphetamine UR: NOT DETECTED
POC Morphine: NOT DETECTED
POC Oxazepam (BZO): NOT DETECTED
POC Oxycodone UR: NOT DETECTED
POC Secobarbital (BAR): NOT DETECTED

## 2022-07-17 LAB — ETHANOL
Alcohol, Ethyl (B): <10 mg/dL (ref <10)
Alcohol, Ethyl (B): <10 mg/dL (ref <10)

## 2022-07-17 LAB — HCG, QUANTITATIVE, PREGNANCY: hCG, Beta Chain, Quant, S: <1 m[IU]/mL (ref <5)

## 2022-07-17 LAB — HIV ANTIBODY (ROUTINE TESTING W REFLEX): HIV Screen 4th Generation wRfx: NONREACTIVE

## 2022-07-17 LAB — POC URINE PREG, ED: Preg Test, Ur: NEGATIVE

## 2022-07-17 LAB — LITHIUM LEVEL: Lithium Lvl: 0.07 mmol/L — ABNORMAL LOW (ref 0.60–1.20)

## 2022-07-17 LAB — SALICYLATE LEVEL: Salicylate Lvl: <7 mg/dL — ABNORMAL LOW (ref 7.0–30.0)

## 2022-07-17 LAB — TSH: TSH: 1.192 u[IU]/mL (ref 0.350–4.500)

## 2022-07-17 LAB — HEMOGLOBIN A1C
Hgb A1c MFr Bld: 5.4 % (ref 4.8–5.6)
Mean Plasma Glucose: 108.28 mg/dL

## 2022-07-17 LAB — ACETAMINOPHEN LEVEL: Acetaminophen (Tylenol), Serum: <10 ug/mL — ABNORMAL LOW (ref 10–30)

## 2022-07-17 LAB — MAGNESIUM: Magnesium: 2.3 mg/dL (ref 1.7–2.4)

## 2022-07-17 LAB — POC SARS CORONAVIRUS 2 AG: SARSCOV2ONAVIRUS 2 AG: NEGATIVE

## 2022-07-17 LAB — SARS CORONAVIRUS 2 BY RT PCR: SARS Coronavirus 2 by RT PCR: NEGATIVE

## 2022-07-17 LAB — I-STAT BETA HCG BLOOD, ED (MC, WL, AP ONLY): I-stat hCG, quantitative: <5 m[IU]/mL (ref <5)

## 2022-07-17 MED ORDER — ALUM & MAG HYDROXIDE-SIMETH 200-200-20 MG/5ML PO SUSP: 30.0000 mL | ORAL | Status: DC | PRN

## 2022-07-17 MED ORDER — HALOPERIDOL LACTATE 5 MG/ML IJ SOLN: 2.0000 mg | Freq: Once | INTRAMUSCULAR | Status: DC | PRN

## 2022-07-17 MED ORDER — LITHIUM CARBONATE ER 450 MG PO TBCR
450.0000 mg | EXTENDED_RELEASE_TABLET | ORAL | Status: AC
  Administered 2022-07-17: 450 mg via ORAL
  Filled 2022-07-17: qty 1

## 2022-07-17 MED ORDER — LORAZEPAM 1 MG PO TABS: 1.0000 mg | ORAL_TABLET | Freq: Once | ORAL | Status: DC | PRN

## 2022-07-17 MED ORDER — MAGNESIUM HYDROXIDE 400 MG/5ML PO SUSP: 30.0000 mL | Freq: Every day | ORAL | Status: DC | PRN

## 2022-07-17 MED ORDER — HALOPERIDOL 2 MG PO TABS: 2.0000 mg | ORAL_TABLET | Freq: Once | ORAL | Status: DC | PRN

## 2022-07-17 MED ORDER — HYDROXYZINE HCL 25 MG PO TABS: 25.0000 mg | ORAL_TABLET | Freq: Three times a day (TID) | ORAL | Status: DC | PRN

## 2022-07-17 MED ORDER — DIPHENHYDRAMINE HCL 50 MG/ML IJ SOLN: 25.0000 mg | Freq: Once | INTRAMUSCULAR | Status: DC | PRN

## 2022-07-17 MED ORDER — TRAZODONE HCL 50 MG PO TABS: 50.0000 mg | ORAL_TABLET | Freq: Every evening | ORAL | Status: DC | PRN

## 2022-07-17 MED ORDER — DIPHENHYDRAMINE HCL 25 MG PO CAPS: 25.0000 mg | ORAL_CAPSULE | Freq: Once | ORAL | Status: DC | PRN

## 2022-07-17 MED ORDER — ACETAMINOPHEN 325 MG PO TABS: 650.0000 mg | ORAL_TABLET | Freq: Four times a day (QID) | ORAL | Status: DC | PRN

## 2022-07-17 MED ORDER — LITHIUM CARBONATE ER 300 MG PO TBCR
300.0000 mg | EXTENDED_RELEASE_TABLET | Freq: Every day | ORAL | Status: DC
  Administered 2022-07-17: 300 mg via ORAL
  Filled 2022-07-17: qty 1

## 2022-07-17 NOTE — ED Notes (Signed)
Told patient I need a urine from her and she stared at this writer for several seconds, walked away and laughed.

## 2022-07-17 NOTE — ED Notes (Signed)
Pt awake & resting at present, no distress noted, calm & cooperative at present.  Monitoring for safety.. °

## 2022-07-17 NOTE — ED Provider Notes (Signed)
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 recommend the patient be transferred to the emergency department for further evaluation.  Pearlie Nies, NP 07/17/22  12:56 PM
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
## 2022-07-17 NOTE — Progress Notes (Signed)
   07/17/22 1018  [facility] Triage Screening (Walk-ins at [facility] only)  How Did You Hear About [place]? Family/Friend  What Is the Reason for Your Visit/Call Today? Cheryl Cohen is a 32 year old female with a psychiatric history of bipolar disorder. Patient is hyper-religious and her first statement is "God is good all the time" and she makes several other statements about "God has a purpose for my life" and that God wanted her to stop taking her medications. . Patient is tangential, she is laughing inappropriately and she is having some psychomotor agitation. Patient reports that she has been dealing with bipolar disorder all her life and reports that her manic episodes are usually triggered by "things that happen in my life". Patient reports that she has been dating a older man and recently found disturbing pictures of other men in his phone. Patient mother provides collateral that last night patient was over her child father house having an episode. Mom reports he called her saying that patient was jerking her neck, her eye was rolling back and she was laughing. Mom took patient to [facility] for said symptoms. Mom reports that patient has not been taking her lithium correctly. Mom reports that she noticed a change in patient behaviors over the past few weeks and she has been encouraging her to contact her provider Dr. Mervyn Skeeters and [facility]. Patient has an upcoming appointment with Dr. Mervyn Skeeters on Friday. Mom reports that patient has started taking lithium about six years ago when she was hospitalized and she has been stable since. Patient denies SI, HI, AVH and reports THC use daily, last use was last night.  How Long Has This Been Causing You Problems? 1 wk - 1 month  Have You Recently Had Any Thoughts About Hurting Yourself? No  Are You Planning to Commit Suicide/Harm Yourself At This time? No  Have you Recently Had Thoughts About Hurting Someone Cheryl Cohen? No  Are You Planning To Harm Someone At This Time? No  Are you currently experiencing  any auditory, visual or other hallucinations? No  Have You Used Any Alcohol or Drugs in the Past 24 Hours? Yes  How long ago did you use Drugs or Alcohol? THC last night  What Did You Use and How Much? THC last night  Do you have any current medical co-morbidities that require immediate attention? No  Clinician description of patient physical appearance/behavior: manic  What Do You Feel Would Help You the Most Today? Treatment for Depression or other mood problem  If access to [facility] Urgent Care was not available, would you have sought care in the Emergency Department? No  Determination of Need Urgent (48 hours)  Options For Referral Medication Management;Outpatient Therapy;Inpatient Hospitalization

## 2022-07-17 NOTE — ED Notes (Signed)
Patient has quiet outbursts of laughs and rolls her eyes in circles.

## 2022-07-17 NOTE — Discharge Instructions (Signed)
It is crucially important for you to take the medications that are prescribed to you for your condition.  Please take lithium once daily as prescribed  Please follow-up with your psychiatrist  I have also given you the information for the behavioral health urgent care please feel free to utilize this resource.

## 2022-07-17 NOTE — Progress Notes (Addendum)
Received Cheryl Cohen in the assessment room, she was hesitant but cooperative with the admission process. She was not able to urinate. She was relocated to the to the OBS area, oriented and given  nourishments per her request. She  denied all of the psychiatric symptoms.
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
## 2022-07-17 NOTE — ED Triage Notes (Signed)
Pt mother reports that she was called about her daughter, Twitching, not communicating like normal, laughing for no reason, appearing to be having a manic episode.  Pt mother says that for several weeks she has noticed she has "been off" and "not herself". Hx of bipolar, and possibly not taking her lithium everyday.   Pt reports use of marijuana tonight, denies SI/HI or hallucinations.

## 2022-07-17 NOTE — ED Provider Notes (Signed)
[facility] EMERGENCY DEPARTMENT AT [facility] Provider Note   CSN: [id number] Arrival date & time: 07/17/22  0015     History {Add pertinent medical, surgical, social history, OB history to HPI:1} Chief Complaint  Patient presents with   Psychiatric Evaluation    Cheryl Cohen is a 32 y.o. female.  HPI  Patient is a 32 year old female with past medical history significant for bipolar on lithium.  She states she takes her lithium every other day.  She denies any symptoms today but was brought in by her mother who feels that she is having a manic episode.  Per mother patient was in a argument with the patient's son's father and was jerking her head around in a way that concerned the mother that she was manic      Home Medications Prior to Admission medications   Medication Sig Start Date End Date Taking? Authorizing Provider  lithium carbonate (ESKALITH) 450 MG CR tablet Take 450 mg by mouth daily.    [provider]      Allergies    Amoxicillin    Review of Systems   Review of Systems  Physical Exam Updated Vital Signs BP 111/80 (BP Location: Right Arm)   Pulse 78   Temp 98 F (36.7 C) (Oral)   Resp 18   Ht 5\' 7"  (1.702 m)   Wt 63.5 kg   LMP 06/30/2022 Comment: Pt reports hving an abortion end of May2023  SpO2 99%   BMI 21.93 kg/m  Physical Exam  ED Results / Procedures / Treatments   Labs (all labs ordered are listed, but only abnormal results are displayed) Labs Reviewed  COMPREHENSIVE METABOLIC PANEL - Abnormal; Notable for the following components:      Result Value   Sodium 133 (*)    Potassium 3.2 (*)    Glucose, Bld 119 (*)    Calcium 8.6 (*)    All other components within normal limits  SALICYLATE LEVEL - Abnormal; Notable for the following components:   Salicylate Lvl <7.0 (*)    All other components within normal limits  ACETAMINOPHEN LEVEL - Abnormal; Notable for the following components:   Acetaminophen  (Tylenol), Serum <10 (*)    All other components within normal limits  LITHIUM LEVEL - Abnormal; Notable for the following components:   Lithium Lvl 0.07 (*)    All other components within normal limits  ETHANOL  CBC WITH DIFFERENTIAL/PLATELET  RAPID URINE DRUG SCREEN, HOSP PERFORMED  I-STAT BETA HCG BLOOD, ED ([facility], [facility], [facility] ONLY)    EKG None  Radiology No results found.  Procedures Procedures  {Document cardiac monitor, telemetry assessment procedure when appropriate:1}  Medications Ordered in ED Medications  lithium carbonate (ESKALITH) ER tablet 450 mg (450 mg Oral Given 07/17/22 0351)    ED Course/ Medical Decision Making/ A&P Clinical Course as of 07/17/22 0451  Mon Jul 17, 2022  0450 Lithium(!): 0.07 [WF]    Clinical Course User Index [WF] Gailen Shelter, [place]   {   Click here for ABCD2, HEART and other calculatorsREFRESH Note before signing :1}                          Medical Decision Making Amount and/or Complexity of Data Reviewed Labs: ordered. Decision-making details documented in ED Course.  Risk Prescription drug management.   ***  {Document critical care time when appropriate:1} {Document review of labs and clinical decision tools ie heart  score, Chads2Vasc2 etc:1}  {Document your independent review of radiology images, and any outside records:1} {Document your discussion with family members, caretakers, and with consultants:1} {Document social determinants of health affecting pt's care:1} {Document your decision making why or why not admission, treatments were needed:1} Final Clinical Impression(s) / ED Diagnoses Final diagnoses:  None    Rx / DC Orders ED Discharge Orders     None

## 2022-07-17 NOTE — ED Notes (Signed)
Pt A&O x 4, no distress noted. Calm & cooperative.  Monitoring for safety. 

## 2022-07-17 NOTE — Telephone Encounter (Signed)
Pt currently at hospital, went to [facility] on 4/11 for pregnancy test, it was negative.

## 2022-07-18 ENCOUNTER — Encounter (HOSPITAL_COMMUNITY): Payer: Self-pay | Admitting: Registered Nurse

## 2022-07-18 ENCOUNTER — Inpatient Hospital Stay (HOSPITAL_COMMUNITY)
Admission: AD | Admit: 2022-07-18 | Discharge: 2022-07-24 | DRG: 885 | Disposition: A | Discharge status: Home / Self Care | Payer: BC Managed Care – PPO | Source: Intra-hospital | Attending: Psychiatry | Admitting: Psychiatry

## 2022-07-18 ENCOUNTER — Other Ambulatory Visit: Payer: Self-pay

## 2022-07-18 DIAGNOSIS — F4310 Post-traumatic stress disorder, unspecified: Secondary | ICD-10-CM | POA: Diagnosis present

## 2022-07-18 DIAGNOSIS — F17290 Nicotine dependence, other tobacco product, uncomplicated: Secondary | ICD-10-CM | POA: Diagnosis present

## 2022-07-18 DIAGNOSIS — Z881 Allergy status to other antibiotic agents status: Secondary | ICD-10-CM | POA: Diagnosis not present

## 2022-07-18 DIAGNOSIS — Z91410 Personal history of adult physical and sexual abuse: Secondary | ICD-10-CM | POA: Diagnosis not present

## 2022-07-18 DIAGNOSIS — F17210 Nicotine dependence, cigarettes, uncomplicated: Secondary | ICD-10-CM | POA: Diagnosis present

## 2022-07-18 DIAGNOSIS — Z811 Family history of alcohol abuse and dependence: Secondary | ICD-10-CM

## 2022-07-18 DIAGNOSIS — F411 Generalized anxiety disorder: Secondary | ICD-10-CM | POA: Diagnosis present

## 2022-07-18 DIAGNOSIS — R000 Tachycardia, unspecified: Secondary | ICD-10-CM | POA: Diagnosis present

## 2022-07-18 DIAGNOSIS — Z79899 Other long term (current) drug therapy: Secondary | ICD-10-CM

## 2022-07-18 DIAGNOSIS — F312 Bipolar disorder, current episode manic severe with psychotic features: Principal | ICD-10-CM | POA: Diagnosis present

## 2022-07-18 DIAGNOSIS — F1220 Cannabis dependence, uncomplicated: Secondary | ICD-10-CM | POA: Diagnosis present

## 2022-07-18 DIAGNOSIS — K5900 Constipation, unspecified: Secondary | ICD-10-CM | POA: Diagnosis present

## 2022-07-18 DIAGNOSIS — Z91148 Patient's other noncompliance with medication regimen for other reason: Secondary | ICD-10-CM | POA: Diagnosis not present

## 2022-07-18 DIAGNOSIS — G4700 Insomnia, unspecified: Secondary | ICD-10-CM | POA: Diagnosis present

## 2022-07-18 DIAGNOSIS — F3164 Bipolar disorder, current episode mixed, severe, with psychotic features: Secondary | ICD-10-CM | POA: Diagnosis present

## 2022-07-18 LAB — GC/CHLAMYDIA PROBE AMP (~~LOC~~) NOT AT ARMC
Chlamydia: NEGATIVE
Comment: NEGATIVE
Comment: NORMAL
Neisseria Gonorrhea: NEGATIVE

## 2022-07-18 LAB — HCG, QUANTITATIVE, PREGNANCY: hCG, Beta Chain, Quant, S: <1 m[IU]/mL (ref <5)

## 2022-07-18 MED ORDER — HYDROXYZINE HCL 25 MG PO TABS: 25.0000 mg | ORAL_TABLET | Freq: Three times a day (TID) | ORAL | Status: DC | PRN

## 2022-07-18 MED ORDER — ACETAMINOPHEN 325 MG PO TABS: 650.0000 mg | ORAL_TABLET | Freq: Four times a day (QID) | ORAL | Status: DC | PRN

## 2022-07-18 MED ORDER — LITHIUM CARBONATE ER 300 MG PO TBCR
300.0000 mg | EXTENDED_RELEASE_TABLET | Freq: Every day | ORAL | Status: DC
  Administered 2022-07-18: 300 mg via ORAL
  Filled 2022-07-18 (×4): qty 1

## 2022-07-18 MED ORDER — DIPHENHYDRAMINE HCL 25 MG PO CAPS: 50.0000 mg | ORAL_CAPSULE | Freq: Three times a day (TID) | ORAL | Status: DC | PRN

## 2022-07-18 MED ORDER — ALUM & MAG HYDROXIDE-SIMETH 200-200-20 MG/5ML PO SUSP: 30.0000 mL | ORAL | Status: DC | PRN

## 2022-07-18 MED ORDER — MAGNESIUM HYDROXIDE 400 MG/5ML PO SUSP: 30.0000 mL | Freq: Every day | ORAL | Status: DC | PRN

## 2022-07-18 MED ORDER — LORAZEPAM 2 MG/ML IJ SOLN: 1.0000 mg | Freq: Three times a day (TID) | INTRAMUSCULAR | Status: DC | PRN

## 2022-07-18 MED ORDER — DIPHENHYDRAMINE HCL 50 MG/ML IJ SOLN: 50.0000 mg | Freq: Three times a day (TID) | INTRAMUSCULAR | Status: DC | PRN

## 2022-07-18 MED ORDER — LORAZEPAM 1 MG PO TABS: 1.0000 mg | ORAL_TABLET | Freq: Three times a day (TID) | ORAL | Status: DC | PRN

## 2022-07-18 NOTE — ED Notes (Signed)
Patient A&O x 4, ambulatory. Patient discharged in no acute distress. Patient denied SI/HI, A/VH upon discharge. Patient verbalized understanding of all discharge instructions explained by staff, to include follow up appointments, RX's and safety plan. Patient reported mood 10/10.  Pt belongings returned to patient from locker # 14 intact. Patient escorted to back sally port  via staff for transport with [employer] to [facility]. Safety maintained.

## 2022-07-18 NOTE — Tx Team (Addendum)
Initial Treatment Plan 07/18/2022 5:41 PM Cheryl Cohen [id number]    PATIENT STRESSORS: Medication change or noncompliance   Substance abuse   Traumatic event     PATIENT STRENGTHS: Communication skills  Physical Health  Religious Affiliation  Supportive family/friends  Work skills    PATIENT IDENTIFIED PROBLEMS: Medication noncompliance "I was taking my medicines but I missed some doses"    Substance Abuse "I smoke marijuana everyday" last used 2 days ago"     Altered perception (manic, psychotic) "I'm pregnant"             DISCHARGE CRITERIA:  Improved stabilization in mood, thinking, and/or behavior Verbal commitment to aftercare and medication compliance Withdrawal symptoms are absent or subacute and managed without 24-hour nursing intervention  PRELIMINARY DISCHARGE PLAN: Outpatient therapy Return to previous living arrangement Return to previous work or school arrangements  PATIENT/FAMILY INVOLVEMENT: This treatment plan has been presented to and reviewed with the patient, Cheryl Cohen. The patient  have been given the opportunity to ask questions and make suggestions.  Sherryl Manges, RN 07/18/2022, 5:41 PM

## 2022-07-18 NOTE — ED Provider Notes (Signed)
Behavioral Health Progress Note  Date and Time: 07/18/2022 10:54 AM Name: Cheryl Cohen MRN:  [id number]  Subjective:   Cheryl Cohen is a 32 year old female with a psychiatric history of bipolar disorder presented to [facility] as walk in accompanied by her mother with complaints of manic behavior, and noncompliance with medications.  Patients' mother Cheryl Cohen [phone] later petitioned involuntary commitment.  Per IVC:  "[name] has been diagnosed with bipolar disorder.  She refuses to take her medications as prescribed by doctors.  [name] is hallucination thinking she is pregnant.  She isn't sleeping.  [name] is talking to herself and is exhibiting aggressive behavior.  She ran out in the street into oncoming traffic.   [name] cannot contact for safety."  Cheryl Cohen seen face to face by this provider, consulted with Dr. Nelly Rout,  and chart reviewed on 07/18/22.  On evaluation Cheryl Cohen is sitting at foot of bed with her eyes closed.  She states that she is filling just fine.  Reports she is tolerating medications with no adverse reaction, eating and sleeping without difficulty.  Patient continues to deny suicidal/self-harm/homicidal ideation, psychosis, and paranoia.  Patient liner with conversation.   During evaluation Cheryl Cohen is sitting at foot of bed with eyes closed.  There is no noted distress.  She is alert/oriented x 4, calm and cooperative.  Her responses were appropriate to assessment questions but responses were mainly yes or no with no elaboration.  Her mood is dysphoric with congruent affect.  She spoke in a clear tone at moderate volume, and normal pace, with minimal eye contact.   She denies suicidal/self-harm/homicidal ideation, psychosis, and paranoia.  Objectively:  at this time there is no evidence of psychosis/mania or delusional thinking.  However patient has been observed talking to someone that is not there (responding to  auditory hallucinations), paranoid, and pacing.  She has been calm throughout her stay only getting upset once when first admitted to continuous assessment unit wanting to be discharged home but was redirectable.  She is able to converse coherently for a short period of time but with longer conversation she becomes more tangential and preoccupied.  Recommending inpatient psychiatric treatment   Diagnosis:  Final diagnoses:  Bipolar affective disorder, mixed, severe, with psychotic behavior    Total Time spent with patient: 20 minutes  Past Psychiatric History:  Patient Active Problem List   Diagnosis Date Noted   Bipolar affective disorder, mixed, severe, with psychotic behavior 07/18/2022   Bipolar disorder, current episode hypomanic 07/17/2022   Abnormal cervical Papanicolaou smear 10/14/2021   Gestational diabetes mellitus (GDM), antepartum 08/25/2020   Psychotic disorder with delusions    Bipolar affective disorder, mixed, severe 10/04/2016   Fetal demise, greater than 22 weeks, antepartum 09/08/2015   Suicidal ideation 10/06/2013   Family history of diabetes mellitus type II 01/11/2012    Past Medical History:  Past Medical History:  Diagnosis Date   Anxiety    Bipolar 1 disorder    hospitalized  3 x onset rx in 9th grade    Depression    Gestational diabetes    Gonorrhea    Hx of varicella    Syphilis    Twin pregnancy, delivered vaginally, IUFD stillborns 09/09/2015   IUFD  22 + weeks  still borns     Family History:  Family History  Problem Relation Age of Onset   Diabetes Mother    Arthritis Mother    Hypertension Father  Alcohol abuse Father    Alcoholism Father     Family Psychiatric  History: None reported Social History:  Social History   Tobacco Use   Smoking status: Every Day    Types: Cigars   Smokeless tobacco: Former   Tobacco comments:    1 cigarette a day  Vaping Use   Vaping Use: Never used  Substance Use Topics   Alcohol use: Yes    Drug use: Yes    Types: Marijuana     Additional Social History:    Pain Medications: Patient denies Prescriptions: Patient denies Over the Counter: Patient denies History of alcohol / drug use?: No history of alcohol / drug abuse Longest period of sobriety (when/how long): N/A Negative Consequences of Use:  (Denies) Withdrawal Symptoms:  (Denies)                    Sleep: Fair  Appetite:  Good  Current Medications:  Current Facility-Administered Medications  Medication Dose Route Frequency Provider Last Rate Last Admin   acetaminophen (TYLENOL) tablet 650 mg  650 mg Oral Q6H PRN Raymar Joiner B, NP       alum & mag hydroxide-simeth (MAALOX/MYLANTA) 200-200-20 MG/5ML suspension 30 mL  30 mL Oral Q4H PRN Naylin Burkle B, NP       diphenhydrAMINE (BENADRYL) capsule 25 mg  25 mg Oral Once PRN Zarah Carbon B, NP       Or   diphenhydrAMINE (BENADRYL) injection 25 mg  25 mg Intramuscular Once PRN Onelia Cadmus B, NP       haloperidol (HALDOL) tablet 2 mg  2 mg Oral Once PRN Dierdra Salameh B, NP       Or   haloperidol lactate (HALDOL) injection 2 mg  2 mg Intramuscular Once PRN Labrina Lines B, NP       hydrOXYzine (ATARAX) tablet 25 mg  25 mg Oral TID PRN Nickolas Chalfin B, NP       lithium carbonate (LITHOBID) ER tablet 300 mg  300 mg Oral QHS Jaidy Cottam B, NP   300 mg at 07/17/22 2137   LORazepam (ATIVAN) tablet 1 mg  1 mg Oral Once PRN Karlita Lichtman B, NP       Or   LORazepam (ATIVAN) tablet 1 mg  1 mg Oral Once PRN Kajal Scalici B, NP       magnesium hydroxide (MILK OF MAGNESIA) suspension 30 mL  30 mL Oral Daily PRN Keelan Tripodi B, NP       Current Outpatient Medications  Medication Sig Dispense Refill   Ascorbic Acid (VITAMIN C GUMMIES PO) Take 1 tablet by mouth daily.     Biotin w/ Vitamins C & E (HAIR SKIN & NAILS GUMMIES PO) Take 1 tablet by mouth daily.     lithium carbonate (LITHOBID) 300 MG ER tablet Take 300 mg by mouth at bedtime.     OVER THE  COUNTER MEDICATION Take 1 tablet by mouth daily. Elderberry Gummies      Labs  Lab Results:  Admission on 07/17/2022  Component Date Value Ref Range Status   SARS Coronavirus 2 by RT PCR 07/17/2022 NEGATIVE  NEGATIVE Final   Performed at [facility], [address], [place], [place] [address]   WBC 07/17/2022 8.4  4.0 - 10.5 K/uL Final   RBC 07/17/2022 4.34  3.87 - 5.11 MIL/uL Final   Hemoglobin 07/17/2022 13.9  12.0 - 15.0 g/dL Final   HCT 60/45/4098 39.5  36.0 -  46.0 % Final   MCV 07/17/2022 91.0  80.0 - 100.0 fL Final   MCH 07/17/2022 32.0  26.0 - 34.0 pg Final   MCHC 07/17/2022 35.2  30.0 - 36.0 g/dL Final   RDW 16/01/9603 13.0  11.5 - 15.5 % Final   Platelets 07/17/2022 282  150 - 400 K/uL Final   nRBC 07/17/2022 0.0  0.0 - 0.2 % Final   Neutrophils Relative % 07/17/2022 45  % Final   Neutro Abs 07/17/2022 3.8  1.7 - 7.7 K/uL Final   Lymphocytes Relative 07/17/2022 41  % Final   Lymphs Abs 07/17/2022 3.4  0.7 - 4.0 K/uL Final   Monocytes Relative 07/17/2022 11  % Final   Monocytes Absolute 07/17/2022 0.9  0.1 - 1.0 K/uL Final   Eosinophils Relative 07/17/2022 2  % Final   Eosinophils Absolute 07/17/2022 0.2  0.0 - 0.5 K/uL Final   Basophils Relative 07/17/2022 1  % Final   Basophils Absolute 07/17/2022 0.0  0.0 - 0.1 K/uL Final   Immature Granulocytes 07/17/2022 0  % Final   Abs Immature Granulocytes 07/17/2022 0.02  0.00 - 0.07 K/uL Final   Performed at [facility], [address], [place], [place] [address]   Sodium 07/17/2022 136  135 - 145 mmol/L Final   Potassium 07/17/2022 3.5  3.5 - 5.1 mmol/L Final   Chloride 07/17/2022 103  98 - 111 mmol/L Final   CO2 07/17/2022 25  22 - 32 mmol/L Final   Glucose, Bld 07/17/2022 96  70 - 99 mg/dL Final   Glucose reference range applies only to samples taken after fasting for at least 8 hours.   BUN 07/17/2022 6  6 - 20 mg/dL Final   Creatinine, Ser 07/17/2022 0.73  0.44 - 1.00 mg/dL Final   Calcium 11/91/4782 9.1   8.9 - 10.3 mg/dL Final   Total Protein 95/62/1308 6.9  6.5 - 8.1 g/dL Final   Albumin 65/78/4696 4.0  3.5 - 5.0 g/dL Final   AST 29/52/8413 30  15 - 41 U/L Final   ALT 07/17/2022 25  0 - 44 U/L Final   Alkaline Phosphatase 07/17/2022 46  38 - 126 U/L Final   Total Bilirubin 07/17/2022 1.3 (H)  0.3 - 1.2 mg/dL Final   GFR, Estimated 07/17/2022 >60  >60 mL/min Final   Comment: (NOTE) Calculated using the CKD-EPI Creatinine Equation (2021)    Anion gap 07/17/2022 8  5 - 15 Final   Performed at [facility], [address], [place], [place] [address]   Hgb A1c MFr Bld 07/17/2022 5.4  4.8 - 5.6 % Final   Comment: (NOTE) Pre diabetes:          5.7%-6.4%  Diabetes:              >6.4%  Glycemic control for   <7.0% adults with diabetes    Mean Plasma Glucose 07/17/2022 108.28  mg/dL Final   Performed at [facility], [address] [place], [place] [address]   Magnesium 07/17/2022 2.3  1.7 - 2.4 mg/dL Final   Performed at [facility], [address], [place], [place] [address]   Alcohol, Ethyl (B) 07/17/2022 <10  <10 mg/dL Final   Comment: (NOTE) Lowest detectable limit for serum alcohol is 10 mg/dL.  For medical purposes only. Performed at [facility], [address] [place], [place] [address]    Cholesterol 07/17/2022 134  0 - 200 mg/dL Final  Triglycerides 07/17/2022 37  <150 mg/dL Final   HDL 81/19/1478 70  >40 mg/dL Final   Total CHOL/HDL Ratio 07/17/2022 1.9  RATIO Final   VLDL 07/17/2022 7  0 - 40 mg/dL Final   LDL Cholesterol 07/17/2022 57  0 - 99 mg/dL Final   Comment:        Total Cholesterol/HDL:CHD Risk Coronary Heart Disease Risk Table                     Men   Women  1/2 Average Risk   3.4   3.3  Average Risk       5.0   4.4  2 X Average Risk   9.6   7.1  3 X Average Risk  23.4   11.0        Use the calculated Patient Ratio above and the CHD Risk Table to determine the patient's CHD Risk.        ATP III CLASSIFICATION (LDL):   <100     mg/dL   Optimal  295-621  mg/dL   Near or Above                    Optimal  130-159  mg/dL   Borderline  308-657  mg/dL   High  >846     mg/dL   Very High Performed at [facility], [address], [place], [place] [address]    TSH 07/17/2022 1.192  0.350 - 4.500 uIU/mL Final   Comment: Performed by a 3rd Generation assay with a functional sensitivity of <=0.01 uIU/mL. Performed at [facility], [address] [place], [place] [address]    Color, Urine 07/17/2022 YELLOW  YELLOW Final   APPearance 07/17/2022 HAZY (A)  CLEAR Final   Specific Gravity, Urine 07/17/2022 1.009  1.005 - 1.030 Final   pH 07/17/2022 8.0  5.0 - 8.0 Final   Glucose, UA 07/17/2022 NEGATIVE  NEGATIVE mg/dL Final   Hgb urine dipstick 07/17/2022 NEGATIVE  NEGATIVE Final   Bilirubin Urine 07/17/2022 NEGATIVE  NEGATIVE Final   Ketones, ur 07/17/2022 NEGATIVE  NEGATIVE mg/dL Final   Protein, ur 24/40/1027 NEGATIVE  NEGATIVE mg/dL Final   Nitrite 25/36/6440 NEGATIVE  NEGATIVE Final   Leukocytes,Ua 07/17/2022 NEGATIVE  NEGATIVE Final   Performed at [facility], [address], [place], [place] [address]   Preg Test, Ur 07/17/2022 Negative  Negative Final   POC Amphetamine UR 07/17/2022 None Detected  NONE DETECTED (Cut Off Level 1000 ng/mL) Final   POC Secobarbital (BAR) 07/17/2022 None Detected  NONE DETECTED (Cut Off Level 300 ng/mL) Final   POC Buprenorphine (BUP) 07/17/2022 None Detected  NONE DETECTED (Cut Off Level 10 ng/mL) Final   POC Oxazepam (BZO) 07/17/2022 None Detected  NONE DETECTED (Cut Off Level 300 ng/mL) Final   POC Cocaine UR 07/17/2022 None Detected  NONE DETECTED (Cut Off Level 300 ng/mL) Final   POC Methamphetamine UR 07/17/2022 None Detected  NONE DETECTED (Cut Off Level 1000 ng/mL) Final   POC Morphine 07/17/2022 None Detected  NONE DETECTED (Cut Off Level 300 ng/mL) Final   POC Methadone UR 07/17/2022 None Detected  NONE DETECTED (Cut Off Level 300 ng/mL) Final   POC  Oxycodone UR 07/17/2022 None Detected  NONE DETECTED (Cut Off Level 100 ng/mL) Final   POC Marijuana UR 07/17/2022 Positive (A)  NONE DETECTED (Cut Off Level 50 ng/mL) Final   HIV Screen 4th Generation wRfx 07/17/2022 Non Reactive  Non Reactive Final   Performed at [facility], [address] [place], [place] [address]   hCG, Conley Rolls, Sharene Butters, Kathie Rhodes 07/17/2022 <1  <5 mIU/mL Final   Comment:          GEST. AGE      CONC.  (mIU/mL)   <=1 WEEK        5 - 50     2 WEEKS       50 - 500     3 WEEKS       100 - 10,000     4 WEEKS     1,000 - 30,000     5 WEEKS     3,500 - 115,000   6-8 WEEKS     12,000 - 270,000    12 WEEKS     15,000 - 220,000        FEMALE AND NON-PREGNANT FEMALE:     LESS THAN 5 mIU/mL Performed at [facility], [address] [place], [place] [address]    SARSCOV2ONAVIRUS 2 AG 07/17/2022 NEGATIVE  NEGATIVE Final   Comment: (NOTE) SARS-CoV-2 antigen NOT DETECTED.   Negative results are presumptive.  Negative results do not preclude SARS-CoV-2 infection and should not be used as the sole basis for treatment or other patient management decisions, including infection  control decisions, particularly in the presence of clinical signs and  symptoms consistent with COVID-19, or in those who have been in contact with the virus.  Negative results must be combined with clinical observations, patient history, and epidemiological information. The expected result is Negative.  Fact Sheet for Patients: https://www.jennings-kim.com/  Fact Sheet for Healthcare Providers: https://alexander-rogers.biz/  This test is not yet approved or cleared by the [place] FDA and  has been authorized for detection and/or diagnosis of SARS-CoV-2 by FDA under an Emergency Use Authorization (EUA).  This EUA will remain in effect (meaning this test can be used) for the duration of  the COV                          ID-19 declaration under Section 564(b)(1)  of the Act, 21 U.S.C. section 360bbb-3(b)(1), unless the authorization is terminated or revoked sooner.    Admission on 07/17/2022, Discharged on 07/17/2022  Component Date Value Ref Range Status   Sodium 07/17/2022 133 (L)  135 - 145 mmol/L Final   Potassium 07/17/2022 3.2 (L)  3.5 - 5.1 mmol/L Final   Chloride 07/17/2022 102  98 - 111 mmol/L Final   CO2 07/17/2022 22  22 - 32 mmol/L Final   Glucose, Bld 07/17/2022 119 (H)  70 - 99 mg/dL Final   Glucose reference range applies only to samples taken after fasting for at least 8 hours.   BUN 07/17/2022 10  6 - 20 mg/dL Final   Creatinine, Ser 07/17/2022 0.67  0.44 - 1.00 mg/dL Final   Calcium 09/81/1914 8.6 (L)  8.9 - 10.3 mg/dL Final   Total Protein 78/29/5621 7.2  6.5 - 8.1 g/dL Final   Albumin 30/86/5784 4.1  3.5 - 5.0 g/dL Final   AST 69/62/9528 29  15 - 41 U/L Final   ALT 07/17/2022 20  0 - 44 U/L Final   Alkaline Phosphatase 07/17/2022 51  38 - 126 U/L Final   Total Bilirubin 07/17/2022 1.2  0.3 - 1.2 mg/dL Final   GFR, Estimated 07/17/2022 >60  >60 mL/min Final   Comment: (NOTE) Calculated using the  CKD-EPI Creatinine Equation (2021)    Anion gap 07/17/2022 9  5 - 15 Final   Performed at [facility], [address], [place], [place] [address]   Alcohol, Ethyl (B) 07/17/2022 <10  <10 mg/dL Final   Comment: (NOTE) Lowest detectable limit for serum alcohol is 10 mg/dL.  For medical purposes only. Performed at [facility], [address] [place], [place] [address]    WBC 07/17/2022 9.6  4.0 - 10.5 K/uL Final   RBC 07/17/2022 4.28  3.87 - 5.11 MIL/uL Final   Hemoglobin 07/17/2022 13.4  12.0 - 15.0 g/dL Final   HCT 09/81/1914 39.2  36.0 - 46.0 % Final   MCV 07/17/2022 91.6  80.0 - 100.0 fL Final   MCH 07/17/2022 31.3  26.0 - 34.0 pg Final   MCHC 07/17/2022 34.2  30.0 - 36.0 g/dL Final   RDW 78/29/5621 13.0  11.5 - 15.5 % Final   Platelets 07/17/2022 285  150 - 400 K/uL Final   nRBC  07/17/2022 0.0  0.0 - 0.2 % Final   Neutrophils Relative % 07/17/2022 46  % Final   Neutro Abs 07/17/2022 4.5  1.7 - 7.7 K/uL Final   Lymphocytes Relative 07/17/2022 41  % Final   Lymphs Abs 07/17/2022 3.9  0.7 - 4.0 K/uL Final   Monocytes Relative 07/17/2022 10  % Final   Monocytes Absolute 07/17/2022 0.9  0.1 - 1.0 K/uL Final   Eosinophils Relative 07/17/2022 2  % Final   Eosinophils Absolute 07/17/2022 0.2  0.0 - 0.5 K/uL Final   Basophils Relative 07/17/2022 1  % Final   Basophils Absolute 07/17/2022 0.1  0.0 - 0.1 K/uL Final   Immature Granulocytes 07/17/2022 0  % Final   Abs Immature Granulocytes 07/17/2022 0.02  0.00 - 0.07 K/uL Final   Performed at [facility], [address] [place], [place] [address]   I-stat hCG, quantitative 07/17/2022 <5.0  <5 mIU/mL Final   Comment 3 07/17/2022          Final   Comment:   GEST. AGE      CONC.  (mIU/mL)   <=1 WEEK        5 - 50     2 WEEKS       50 - 500     3 WEEKS       100 - 10,000     4 WEEKS     1,000 - 30,000        FEMALE AND NON-PREGNANT FEMALE:     LESS THAN 5 mIU/mL    Salicylate Lvl 07/17/2022 <7.0 (L)  7.0 - 30.0 mg/dL Final   Performed at [facility], [address] [place], [place] [address]   Acetaminophen (Tylenol), Serum 07/17/2022 <10 (L)  10 - 30 ug/mL Final   Comment: (NOTE) Therapeutic concentrations vary significantly. A range of 10-30 ug/mL  may be an effective concentration for many patients. However, some  are best treated at concentrations outside of this range. Acetaminophen concentrations >150 ug/mL at 4 hours after ingestion  and >50 ug/mL at 12 hours after ingestion are often associated with  toxic reactions.  Performed at [facility], [address] [place], [place] [address]    Lithium Lvl 07/17/2022 0.07 (L)  0.60 - 1.20 mmol/L Final   Performed at [facility], [address], [place], [place] [address]   hCG, Conley Rolls, Sharene Butters, Kathie Rhodes 07/17/2022 <1  <  5 mIU/mL Final   Comment:          GEST. AGE      CONC.  (mIU/mL)   <=1 WEEK        5 - 50     2 WEEKS       50 - 500     3 WEEKS       100 - 10,000     4 WEEKS     1,000 - 30,000     5 WEEKS     3,500 - 115,000   6-8 WEEKS     12,000 - 270,000    12 WEEKS     15,000 - 220,000        FEMALE AND NON-PREGNANT FEMALE:     LESS THAN 5 mIU/mL Performed at [facility], [address], [place], [place] [address]   Admission on 07/13/2022, Discharged on 07/13/2022  Component Date Value Ref Range Status   Preg Test, Ur 07/13/2022 NEGATIVE  NEGATIVE Final   Comment:        THE SENSITIVITY OF THIS METHODOLOGY IS >24 mIU/mL    Glucose, UA 07/13/2022 NEGATIVE  NEGATIVE mg/dL Final   Bilirubin Urine 07/13/2022 NEGATIVE  NEGATIVE Final   Ketones, ur 07/13/2022 TRACE (A)  NEGATIVE mg/dL Final   Specific Gravity, Urine 07/13/2022 1.025  1.005 - 1.030 Final   Hgb urine dipstick 07/13/2022 NEGATIVE  NEGATIVE Final   pH 07/13/2022 6.0  5.0 - 8.0 Final   Protein, ur 07/13/2022 NEGATIVE  NEGATIVE mg/dL Final   Urobilinogen, UA 07/13/2022 0.2  0.0 - 1.0 mg/dL Final   Nitrite 60/45/4098 NEGATIVE  NEGATIVE Final   Leukocytes,Ua 07/13/2022 NEGATIVE  NEGATIVE Final   Biochemical Testing Only. Please order routine urinalysis from main lab if confirmatory testing is needed.   hCG, Beta Chain, Quant, S 07/13/2022 <1  <5 mIU/mL Final   Comment:          GEST. AGE      CONC.  (mIU/mL)   <=1 WEEK        5 - 50     2 WEEKS       50 - 500     3 WEEKS       100 - 10,000     4 WEEKS     1,000 - 30,000     5 WEEKS     3,500 - 115,000   6-8 WEEKS     12,000 - 270,000    12 WEEKS     15,000 - 220,000        FEMALE AND NON-PREGNANT FEMALE:     LESS THAN 5 mIU/mL Performed at [facility], [address] [place], [place] [address]   Office Visit on 07/06/2022  Component Date Value Ref Range Status   TSH 07/06/2022 0.81  0.35 - 5.50 uIU/mL Final   Free  T4 07/06/2022 1.17  0.60 - 1.60 ng/dL Final   Comment: Specimens from patients who are undergoing biotin therapy and /or ingesting biotin supplements may contain high levels of biotin.  The higher biotin concentration in these specimens interferes with this Free T4 assay.  Specimens that contain high levels  of biotin may cause false high results for this Free T4 assay.  Please interpret results in light of the total clinical presentation of the patient.     Sodium 07/06/2022 139  135 - 145 mEq/L Final   Potassium 07/06/2022 4.0  3.5 - 5.1 mEq/L  Final   Chloride 07/06/2022 103  96 - 112 mEq/L Final   CO2 07/06/2022 29  19 - 32 mEq/L Final   Glucose, Bld 07/06/2022 96  70 - 99 mg/dL Final   BUN 16/01/9603 10  6 - 23 mg/dL Final   Creatinine, Ser 07/06/2022 0.80  0.40 - 1.20 mg/dL Final   Total Bilirubin 07/06/2022 0.7  0.2 - 1.2 mg/dL Final   Alkaline Phosphatase 07/06/2022 51  39 - 117 U/L Final   AST 07/06/2022 15  0 - 37 U/L Final   ALT 07/06/2022 11  0 - 35 U/L Final   Total Protein 07/06/2022 7.9  6.0 - 8.3 g/dL Final   Albumin 54/12/8117 4.8  3.5 - 5.2 g/dL Final   GFR 14/78/2956 98.96  >60.00 mL/min Final   Calculated using the CKD-EPI Creatinine Equation (2021)   Calcium 07/06/2022 10.1  8.4 - 10.5 mg/dL Final   WBC 21/30/8657 7.7  4.0 - 10.5 K/uL Final   RBC 07/06/2022 4.78  3.87 - 5.11 Mil/uL Final   Hemoglobin 07/06/2022 15.1 (H)  12.0 - 15.0 g/dL Final   HCT 84/69/6295 43.9  36.0 - 46.0 % Final   MCV 07/06/2022 91.9  78.0 - 100.0 fl Final   MCHC 07/06/2022 34.4  30.0 - 36.0 g/dL Final   RDW 28/41/3244 13.4  11.5 - 15.5 % Final   Platelets 07/06/2022 314.0  150.0 - 400.0 K/uL Final   Neutrophils Relative % 07/06/2022 47.8  43.0 - 77.0 % Final   Lymphocytes Relative 07/06/2022 38.3  12.0 - 46.0 % Final   Monocytes Relative 07/06/2022 11.8  3.0 - 12.0 % Final   Eosinophils Relative 07/06/2022 1.8  0.0 - 5.0 % Final   Basophils Relative 07/06/2022 0.3  0.0 - 3.0 % Final    Neutro Abs 07/06/2022 3.7  1.4 - 7.7 K/uL Final   Lymphs Abs 07/06/2022 3.0  0.7 - 4.0 K/uL Final   Monocytes Absolute 07/06/2022 0.9  0.1 - 1.0 K/uL Final   Eosinophils Absolute 07/06/2022 0.1  0.0 - 0.7 K/uL Final   Basophils Absolute 07/06/2022 0.0  0.0 - 0.1 K/uL Final   HIV 1&2 Ab, 4th Generation 07/06/2022 NON-REACTIVE  NON-REACTIVE Final   Comment: HIV-1 antigen and HIV-1/HIV-2 antibodies were not detected. There is no laboratory evidence of HIV infection. Marland Kitchen PLEASE NOTE: This information has been disclosed to you from records whose confidentiality may be protected by state law.  If your state requires such protection, then the state law prohibits you from making any further disclosure of the information without the specific written consent of the person to whom it pertains, or as otherwise permitted by law. A general authorization for the release of medical or other information is NOT sufficient for this purpose. . For additional information please refer to http://education.questdiagnostics.com/faq/FAQ106 (This link is being provided for informational/ educational purposes only.) . Marland Kitchen The performance of this assay has not been clinically validated in patients less than 25 years old. .   Office Visit on 05/24/2022  Component Date Value Ref Range Status   High risk HPV 05/24/2022 Negative   Final   Adequacy 05/24/2022 Satisfactory for evaluation; transformation zone component PRESENT.   Final   Diagnosis 05/24/2022 - Negative for intraepithelial lesion or malignancy (NILM)   Final   Microorganisms 05/24/2022 Shift in flora suggestive of bacterial vaginosis   Final   Comment 05/24/2022 Normal Reference Range HPV - Negative   Final   Neisseria Gonorrhea 05/24/2022 Negative   Final  Chlamydia 05/24/2022 Negative   Final   Trichomonas 05/24/2022 Negative   Final   Bacterial Vaginitis (gardnerella) 05/24/2022 Positive (A)   Final   Candida Vaginitis 05/24/2022 Negative   Final    Candida Glabrata 05/24/2022 Negative   Final   Comment 05/24/2022 Normal Reference Range Bacterial Vaginosis - Negative   Final   Comment 05/24/2022 Normal Reference Range Candida Species - Negative   Final   Comment 05/24/2022 Normal Reference Range Candida Galbrata - Negative   Final   Comment 05/24/2022 Normal Reference Range Trichomonas - Negative   Final   Comment 05/24/2022 Normal Reference Ranger Chlamydia - Negative   Final   Comment 05/24/2022 Normal Reference Range Neisseria Gonorrhea - Negative   Final   Hep C Virus Ab 05/24/2022 Non Reactive  Non Reactive Final   Comment: HCV antibody alone does not differentiate between previously resolved infection and active infection. Equivocal and Reactive HCV antibody results should be followed up with an HCV RNA test to support the diagnosis of active HCV infection.   Office Visit on 01/24/2022  Component Date Value Ref Range Status   Glucose, UA 01/24/2022 Negative  Negative Final   Bilirubin, UA 01/24/2022 neg   Final   Ketones, UA 01/24/2022 neg   Final   Spec Grav, UA 01/24/2022 1.020  1.010 - 1.025 Final   Blood, UA 01/24/2022 positive   Final   pH, UA 01/24/2022 6.5  5.0 - 8.0 Final   Protein, UA 01/24/2022 Positive (A)  Negative Final   Urobilinogen, UA 01/24/2022 0.2  0.2 or 1.0 E.U./dL Final   Nitrite, UA 16/01/9603 positive   Final   Leukocytes, UA 01/24/2022 Negative  Negative Final   MICRO NUMBER: 01/24/2022 [id number]   Final   SPECIMEN QUALITY: 01/24/2022 Adequate   Final   Sample Source 01/24/2022 URINE   Final   STATUS: 01/24/2022 FINAL   Final   ISOLATE 1: 01/24/2022 Klebsiella pneumoniae (A)   Final   Greater than 100,000 CFU/mL of Klebsiella pneumoniae   RPR Ser Ql 01/24/2022 NON-REACTIVE  NON-REACTIVE Final   HIV 1&2 Ab, 4th Generation 01/24/2022 NON-REACTIVE  NON-REACTIVE Final   Comment: HIV-1 antigen and HIV-1/HIV-2 antibodies were not detected. There is no laboratory evidence of HIV infection. Marland Kitchen PLEASE  NOTE: This information has been disclosed to you from records whose confidentiality may be protected by state law.  If your state requires such protection, then the state law prohibits you from making any further disclosure of the information without the specific written consent of the person to whom it pertains, or as otherwise permitted by law. A general authorization for the release of medical or other information is NOT sufficient for this purpose. . For additional information please refer to http://education.questdiagn[employer].com/faq/FAQ106 (This link is being provided for informational/ educational purposes only.) . Marland Kitchen The performance of this assay has not been clinically validated in patients less than 65 years old. .    Neisseria Gonorrhea 01/24/2022 Negative   Final   Chlamydia 01/24/2022 Negative   Final   Trichomonas 01/24/2022 Negative   Final   Comment 01/24/2022 Normal Reference Range Trichomonas - Negative   Final   Comment 01/24/2022 Normal Reference Ranger Chlamydia - Negative   Final   Comment 01/24/2022 Normal Reference Range Neisseria Gonorrhea - Negative   Final   Free T4 01/24/2022 0.88  0.60 - 1.60 ng/dL Final   Comment: Specimens from patients who are undergoing biotin therapy and /or ingesting biotin supplements may contain high levels of  biotin.  The higher biotin concentration in these specimens interferes with this Free T4 assay.  Specimens that contain high levels  of biotin may cause false high results for this Free T4 assay.  Please interpret results in light of the total clinical presentation of the patient.     TSH 01/24/2022 1.18  0.35 - 5.50 uIU/mL Final   WBC 01/24/2022 8.1  4.0 - 10.5 K/uL Final   RBC 01/24/2022 4.71  3.87 - 5.11 Mil/uL Final   Hemoglobin 01/24/2022 14.2  12.0 - 15.0 g/dL Final   HCT 11/91/4782 42.8  36.0 - 46.0 % Final   MCV 01/24/2022 90.8  78.0 - 100.0 fl Final   MCHC 01/24/2022 33.3  30.0 - 36.0 g/dL Final   RDW 95/62/1308  13.5  11.5 - 15.5 % Final   Platelets 01/24/2022 295.0  150.0 - 400.0 K/uL Final   Neutrophils Relative % 01/24/2022 58.6  43.0 - 77.0 % Final   Lymphocytes Relative 01/24/2022 30.8  12.0 - 46.0 % Final   Monocytes Relative 01/24/2022 8.2  3.0 - 12.0 % Final   Eosinophils Relative 01/24/2022 1.8  0.0 - 5.0 % Final   Basophils Relative 01/24/2022 0.6  0.0 - 3.0 % Final   Neutro Abs 01/24/2022 4.8  1.4 - 7.7 K/uL Final   Lymphs Abs 01/24/2022 2.5  0.7 - 4.0 K/uL Final   Monocytes Absolute 01/24/2022 0.7  0.1 - 1.0 K/uL Final   Eosinophils Absolute 01/24/2022 0.1  0.0 - 0.7 K/uL Final   Basophils Absolute 01/24/2022 0.0  0.0 - 0.1 K/uL Final   Sodium 01/24/2022 138  135 - 145 mEq/L Final   Potassium 01/24/2022 4.0  3.5 - 5.1 mEq/L Final   Chloride 01/24/2022 104  96 - 112 mEq/L Final   CO2 01/24/2022 29  19 - 32 mEq/L Final   Glucose, Bld 01/24/2022 89  70 - 99 mg/dL Final   BUN 65/78/4696 9  6 - 23 mg/dL Final   Creatinine, Ser 01/24/2022 0.69  0.40 - 1.20 mg/dL Final   GFR 29/52/8413 116.93  >60.00 mL/min Final   Calculated using the CKD-EPI Creatinine Equation (2021)   Calcium 01/24/2022 9.3  8.4 - 10.5 mg/dL Final    Blood Alcohol level:  Lab Results  Component Value Date   ETH <10 07/17/2022   ETH <10 07/17/2022    Metabolic Disorder Labs: Lab Results  Component Value Date   HGBA1C 5.4 07/17/2022   MPG 108.28 07/17/2022   MPG 111 02/22/2016   Lab Results  Component Value Date   PROLACTIN 92.6 (H) 02/22/2016   PROLACTIN  10/31/2009    35.4 (NOTE)     Reference Ranges:                 Female:                       2.1 -  17.1 ng/ml                 Female:   Pregnant          9.7 - 208.5 ng/mL                           Non Pregnant      2.8 -  29.2 ng/mL  Post  Menopausal   1.8 -  20.3 ng/mL                     Lab Results  Component Value Date   CHOL 134 07/17/2022   TRIG 37 07/17/2022   HDL 70 07/17/2022   CHOLHDL 1.9 07/17/2022   VLDL 7  07/17/2022   LDLCALC 57 07/17/2022   LDLCALC 62 02/22/2016    Therapeutic Lab Levels: Lab Results  Component Value Date   LITHIUM 0.07 (L) 07/17/2022   LITHIUM <0.06 (L) 10/20/2016   No results found for: "VALPROATE" No results found for: "CBMZ"  Physical Findings   AIMS    Flowsheet Row [facility]  AIMS Total Score 0      GAD-7    Flowsheet Row Routine Prenatal from 08/20/2020 in [facility] for [facility] [place]  Total GAD-7 Score 0      PHQ2-9    Flowsheet Row Office Visit from 05/24/2022 in [facility] for [facility] [place] Nutrition from 08/25/2020 in [facility] Routine Prenatal from 08/20/2020 in [facility] at [place] Initial Prenatal from 06/25/2020 in [facility] at [place] Office Visit from 01/22/2017 in [facility] at [facility] Total Score 0 0 0 0 0  PHQ-9 Total Score 0 -- 0 1 --      Flowsheet Row ED from 07/17/2022 in [facility] Most recent reading at 07/17/2022  2:39 PM ED from 07/17/2022 in [facility] Department at [facility] Most recent reading at 07/17/2022 12:28 AM ED from 07/13/2022 in [facility] [place] Most recent reading at 07/13/2022  3:03 PM  C-SSRS RISK CATEGORY No Risk No Risk No Risk        Musculoskeletal  Strength & Muscle Tone: within normal limits Gait & Station: normal Patient leans: N/A  Psychiatric Specialty Exam  Presentation  General Appearance:  Appropriate for Environment  Eye Contact: Fair  Speech: Clear and Coherent; Normal Rate  Speech Volume: Normal  Handedness: Right   Mood and Affect  Mood: Dysphoric  Affect: Congruent   Thought Process  Thought Processes: Coherent; Linear  Descriptions of  Associations:Circumstantial  Orientation:Full (Time, Place and Person)  Thought Content:Paranoid Ideation; WDL  Diagnosis of Schizophrenia or Schizoaffective disorder in past: No    Hallucinations:Hallucinations: None (Denies but staff has witnessed patient talking to self or someone not there)  Ideas of Reference:Paranoia  Suicidal Thoughts:Suicidal Thoughts: No  Homicidal Thoughts:Homicidal Thoughts: No   Sensorium  Memory: Immediate Good; Recent Good; Remote Fair  Judgment: Impaired  Insight: Present   Executive Functions  Concentration: Fair  Attention Span: Fair  Recall: Fair  Fund of Knowledge: Good  Language: Good   Psychomotor Activity  Psychomotor Activity: Psychomotor Activity: Normal   Assets  Assets: Communication Skills; Housing; Physical Health; Social Support   Sleep  Sleep: Sleep: Good   Nutritional Assessment (For OBS and FBC admissions only) Has the patient had a weight loss or gain of 10 pounds or more in the last 3 months?: No Has the patient had a decrease in food intake/or appetite?: No Does the patient have dental problems?: No Does the patient have eating habits or behaviors that may be indicators of an eating disorder including binging or inducing vomiting?: No Has the patient recently lost  weight without trying?: 0 Has the patient been eating poorly because of a decreased appetite?: 0 Malnutrition Screening Tool Score: 0    Physical Exam  Physical Exam Vitals and nursing note reviewed.  Constitutional:      General: She is not in acute distress.    Appearance: Normal appearance. She is not ill-appearing.  HENT:     Head: Normocephalic.  Eyes:     Conjunctiva/sclera: Conjunctivae normal.  Cardiovascular:     Rate and Rhythm: Normal rate.  Pulmonary:     Effort: Pulmonary effort is normal.  Musculoskeletal:        General: Normal range of motion.     Cervical back: Normal range of motion.  Skin:     General: Skin is warm and dry.  Neurological:     Mental Status: She is alert and oriented to person, place, and time.  Psychiatric:        Attention and Perception: She perceives auditory hallucinations.        Mood and Affect: Mood is depressed.        Speech: Speech normal.        Behavior: Behavior is cooperative.        Thought Content: Thought content is paranoid. Thought content does not include homicidal or suicidal ideation.        Judgment: Judgment is impulsive.    Review of Systems  Constitutional:        No complaints voiced  Psychiatric/Behavioral:  Depression: Denies. Substance abuse: Marijuana. Suicidal ideas: Denies. Nervous/anxious: Denies. Insomnia: Denies.   All other systems reviewed and are negative.  Blood pressure 120/72, pulse 70, temperature 97.8 F (36.6 C), temperature source Oral, resp. rate 18, last menstrual period 06/30/2022, SpO2 100 %, unknown if currently breastfeeding. There is no height or weight on file to calculate BMI.  Treatment Plan Summary: Daily contact with patient to assess and evaluate symptoms and progress in treatment, Medication management, and Plan Inpatient psychiatric treatment  Zuzu Befort, NP 07/18/2022 10:54 AM

## 2022-07-18 NOTE — ED Notes (Signed)
Pt awake & resting, no distress noted.  Monitoring for safety. 

## 2022-07-18 NOTE — ED Notes (Signed)
Patient resting quietly in bed with eyes open, Respirations equal and unlabored, skin warm and dry, NAD. No change in assessment or acuity. Routine safety checks conducted according to facility protocol. Will continue to monitor for safety.   

## 2022-07-18 NOTE — Progress Notes (Addendum)
   07/18/22 2001  Psych Admission Type (Psych Patients Only)  Admission Status Involuntary  Psychosocial Assessment  Patient Complaints None  Eye Contact Fair  Facial Expression Animated  Affect Apprehensive  Speech Logical/coherent  Interaction Assertive  Motor Activity Other (Comment) (WDL)  Appearance/Hygiene Unremarkable  Behavior Characteristics Cooperative  Mood Pleasant  Thought Process  Coherency WDL  Content Blaming others  Delusions Persecutory  Perception WDL  Hallucination None reported or observed  Judgment Poor  Confusion None  Danger to Self  Current suicidal ideation? Denies  Agreement Not to Harm Self Yes  Description of Agreement verbal  Danger to Others  Danger to Others None reported or observed   Pt is pleasant upon assessment, denies SI/HI/AVH, med compliant. Pt states belief she is currently pregnant. Pt also mentions familial issues at home with boyfriend and mom.

## 2022-07-18 NOTE — Group Note (Signed)
Date:  07/18/2022 Time:  8:59 PM  Group Topic/Focus:  Wrap-Up Group:   The focus of this group is to help patients review their daily goal of treatment and discuss progress on daily workbooks.    Participation Level:  None  Participation Quality:   did not participate  Affect:  Flat  Cognitive:  Oriented  Insight: None  Engagement in Group:  Lacking  Modes of Intervention:  Education  Additional Comments:  Patient attended group but did not participated.  Lita Mains [facility] 07/18/2022, 8:59 PM
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
## 2022-07-18 NOTE — ED Notes (Signed)
[employer] contacted for transport 

## 2022-07-18 NOTE — ED Notes (Signed)
Patient sitting in chair quietly. Patient voices no complaints or concerns. Respirations equal and unlabored, skin warm and dry, NAD. No change in assessment or acuity. Routine safety checks conducted according to facility protocol. Will continue to monitor for safety.

## 2022-07-18 NOTE — ED Notes (Signed)
Pt awake & resting at present, no distress noted. Calm & cooperative, appears to be responding to internal stimuli.  Monitoring for safety.

## 2022-07-18 NOTE — ED Notes (Signed)
Patient A&Ox4. Patient reports sleeping good last night and feeling good. Patient currently denies SI/HI and AVH. Patient denies any physical complaints when asked. No acute distress noted. Support and encouragement provided. Routine safety checks conducted according to facility protocol. Encouraged patient to notify staff if thoughts of harm toward self or others arise. Patient verbalize understanding and agreement. Will continue to monitor for safety.
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
## 2022-07-18 NOTE — Progress Notes (Signed)
Pt was accepted to [facility] TODAY 07/18/2022. Bed assignment: 505-2  Pt meets inpatient criteria per Assunta Found, NP  Attending Physician will be Phineas Inches, MD  Report can be called to: - Adult unit: [phone]  Pt can arrive after 4 PM  Care Team Notified: [facility] Sharyne Peach, RN, Assunta Found, NP, Will Phillips Odor, RN, Tyrell Antonio, RN, and [facility], RN  [place], [place]  07/18/2022 3:18 PM

## 2022-07-19 ENCOUNTER — Encounter (HOSPITAL_COMMUNITY): Payer: Self-pay

## 2022-07-19 DIAGNOSIS — F411 Generalized anxiety disorder: Secondary | ICD-10-CM | POA: Insufficient documentation

## 2022-07-19 DIAGNOSIS — F312 Bipolar disorder, current episode manic severe with psychotic features: Secondary | ICD-10-CM | POA: Diagnosis not present

## 2022-07-19 DIAGNOSIS — G4700 Insomnia, unspecified: Secondary | ICD-10-CM | POA: Diagnosis present

## 2022-07-19 DIAGNOSIS — F1220 Cannabis dependence, uncomplicated: Secondary | ICD-10-CM | POA: Diagnosis present

## 2022-07-19 MED ORDER — HALOPERIDOL 5 MG PO TABS: 5.0000 mg | ORAL_TABLET | Freq: Four times a day (QID) | ORAL | Status: DC | PRN

## 2022-07-19 MED ORDER — LITHIUM CARBONATE ER 450 MG PO TBCR
450.0000 mg | EXTENDED_RELEASE_TABLET | Freq: Every day | ORAL | Status: DC
  Administered 2022-07-19 – 2022-07-23 (×5): 450 mg via ORAL
  Filled 2022-07-19 (×8): qty 1

## 2022-07-19 MED ORDER — RISPERIDONE 1 MG PO TABS
1.0000 mg | ORAL_TABLET | Freq: Two times a day (BID) | ORAL | Status: DC
  Administered 2022-07-19 – 2022-07-24 (×10): 1 mg via ORAL
  Filled 2022-07-19 (×15): qty 1

## 2022-07-19 MED ORDER — HALOPERIDOL LACTATE 5 MG/ML IJ SOLN: 5.0000 mg | Freq: Four times a day (QID) | INTRAMUSCULAR | Status: DC | PRN

## 2022-07-19 MED ORDER — TRAZODONE HCL 50 MG PO TABS: 50.0000 mg | ORAL_TABLET | Freq: Every evening | ORAL | Status: DC | PRN

## 2022-07-19 NOTE — Group Note (Unsigned)
Date:  07/19/2022 Time:  9:19 AM  Group Topic/Focus:  Goals Group:   The focus of this group is to help patients establish daily goals to achieve during treatment and discuss how the patient can incorporate goal setting into their daily lives to aide in recovery. Orientation:   The focus of this group is to educate the patient on the purpose and policies of crisis stabilization and provide a format to answer questions about their admission.  The group details unit policies and expectations of patients while admitted.     Participation Level:  {[facility] PARTICIPATION LOVFI:43329}  Participation Quality:  {[facility] PARTICIPATION QUALITY:22265}  Affect:  {[facility] AFFECT:22266}  Cognitive:  {[facility] COGNITIVE:22267}  Insight: {[facility] Insight2:20797}  Engagement in Group:  {[facility] ENGAGEMENT IN JJOAC:16606}  Modes of Intervention:  {[facility] MODES OF INTERVENTION:22269}  Additional Comments:  ***  Donell Beers 07/19/2022, 9:19 AM
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
## 2022-07-19 NOTE — BH IP Treatment Plan (Signed)
Interdisciplinary Treatment and Diagnostic Plan Update  07/19/2022 Time of Session: 10:30 AM  Cheryl Cohen MRN: [id number]  Principal Diagnosis: Bipolar affective disorder, currently manic, severe, with psychotic features  Secondary Diagnoses: Principal Problem:   Bipolar affective disorder, currently manic, severe, with psychotic features   Current Medications:  Current Facility-Administered Medications  Medication Dose Route Frequency Provider Last Rate Last Admin   acetaminophen (TYLENOL) tablet 650 mg  650 mg Oral Q6H PRN Rankin, Shuvon B, NP       alum & mag hydroxide-simeth (MAALOX/MYLANTA) 200-200-20 MG/5ML suspension 30 mL  30 mL Oral Q4H PRN Rankin, Shuvon B, NP       alum & mag hydroxide-simeth (MAALOX/MYLANTA) 200-200-20 MG/5ML suspension 30 mL  30 mL Oral Q4H PRN Rankin, Shuvon B, NP       diphenhydrAMINE (BENADRYL) capsule 50 mg  50 mg Oral TID PRN Rankin, Shuvon B, NP       Or   diphenhydrAMINE (BENADRYL) injection 50 mg  50 mg Intramuscular TID PRN Rankin, Shuvon B, NP       hydrOXYzine (ATARAX) tablet 25 mg  25 mg Oral TID PRN Rankin, Shuvon B, NP       lithium carbonate (LITHOBID) ER tablet 300 mg  300 mg Oral QHS Rankin, Shuvon B, NP   300 mg at 07/18/22 2102   LORazepam (ATIVAN) tablet 1 mg  1 mg Oral TID PRN Rankin, Shuvon B, NP       Or   LORazepam (ATIVAN) injection 1 mg  1 mg Intramuscular TID PRN Rankin, Shuvon B, NP       magnesium hydroxide (MILK OF MAGNESIA) suspension 30 mL  30 mL Oral Daily PRN Rankin, Shuvon B, NP       magnesium hydroxide (MILK OF MAGNESIA) suspension 30 mL  30 mL Oral Daily PRN Rankin, Shuvon B, NP       PTA Medications: Medications Prior to Admission  Medication Sig Dispense Refill Last Dose   Ascorbic Acid (VITAMIN C GUMMIES PO) Take 1 tablet by mouth daily.      Biotin w/ Vitamins C & E (HAIR SKIN & NAILS GUMMIES PO) Take 1 tablet by mouth daily.      lithium carbonate (LITHOBID) 300 MG ER tablet Take 300 mg by mouth at  bedtime.      OVER THE COUNTER MEDICATION Take 1 tablet by mouth daily. Elderberry Gummies       Patient Stressors: Medication change or noncompliance   Substance abuse   Traumatic event    Patient Strengths: Manufacturing systems engineer  Physical Health  Religious Affiliation  Supportive family/friends  Work skills   Treatment Modalities: Medication Management, Group therapy, Case management,  1 to 1 session with clinician, Psychoeducation, Recreational therapy.   Physician Treatment Plan for Primary Diagnosis: Bipolar affective disorder, currently manic, severe, with psychotic features Long Term Goal(s):     Short Term Goals:    Medication Management: Evaluate patient's response, side effects, and tolerance of medication regimen.  Therapeutic Interventions: 1 to 1 sessions, Unit Group sessions and Medication administration.  Evaluation of Outcomes: Not Progressing  Physician Treatment Plan for Secondary Diagnosis: Principal Problem:   Bipolar affective disorder, currently manic, severe, with psychotic features  Long Term Goal(s):     Short Term Goals:       Medication Management: Evaluate patient's response, side effects, and tolerance of medication regimen.  Therapeutic Interventions: 1 to 1 sessions, Unit Group sessions and Medication administration.  Evaluation of Outcomes: Not Progressing  RN Treatment Plan for Primary Diagnosis: Bipolar affective disorder, currently manic, severe, with psychotic features Long Term Goal(s): Knowledge of disease and therapeutic regimen to maintain health will improve  Short Term Goals: Ability to remain free from injury will improve, Ability to verbalize frustration and anger appropriately will improve, Ability to demonstrate self-control, Ability to participate in decision making will improve, Ability to verbalize feelings will improve, Ability to disclose and discuss suicidal ideas, Ability to identify and develop effective coping  behaviors will improve, and Compliance with prescribed medications will improve  Medication Management: RN will administer medications as ordered by provider, will assess and evaluate patient's response and provide education to patient for prescribed medication. RN will report any adverse and/or side effects to prescribing provider.  Therapeutic Interventions: 1 on 1 counseling sessions, Psychoeducation, Medication administration, Evaluate responses to treatment, Monitor vital signs and CBGs as ordered, Perform/monitor CIWA, COWS, AIMS and Fall Risk screenings as ordered, Perform wound care treatments as ordered.  Evaluation of Outcomes: Not Progressing   LCSW Treatment Plan for Primary Diagnosis: Bipolar affective disorder, currently manic, severe, with psychotic features Long Term Goal(s): Safe transition to appropriate next level of care at discharge, Engage patient in therapeutic group addressing interpersonal concerns.  Short Term Goals: Engage patient in aftercare planning with referrals and resources, Increase social support, Increase ability to appropriately verbalize feelings, Increase emotional regulation, Facilitate acceptance of mental health diagnosis and concerns, Facilitate patient progression through stages of change regarding substance use diagnoses and concerns, Identify triggers associated with mental health/substance abuse issues, and Increase skills for wellness and recovery  Therapeutic Interventions: Assess for all discharge needs, 1 to 1 time with Social worker, Explore available resources and support systems, Assess for adequacy in community support network, Educate family and significant other(s) on suicide prevention, Complete Psychosocial Assessment, Interpersonal group therapy.  Evaluation of Outcomes: Not Progressing   Progress in Treatment: Attending groups: Yes. Participating in groups: No. Taking medication as prescribed: Yes. Toleration medication:  Yes. Family/Significant other contact made: No, will contact:  will contact whoever patient give CSW permission to contact  Patient understands diagnosis: No. Discussing patient identified problems/goals with staff: Yes. Medical problems stabilized or resolved: Yes. Denies suicidal/homicidal ideation: Yes. Issues/concerns per patient self-inventory: No.   New problem(s) identified: No, Describe:  None reported   New Short Term/Long Term Goal(s): medication stabilization, elimination of SI thoughts, development of comprehensive mental wellness plan.    Patient Goals:  " work on my coping strategies and copings when it comes to dealing with my mom and son father "   Discharge Plan or Barriers: Patient recently admitted. CSW will continue to follow and assess for appropriate referrals and possible discharge planning.    Reason for Continuation of Hospitalization: Anxiety Delusions  Depression Medication stabilization  Estimated Length of Stay: 5-7 days   Last 3 [place] Suicide Severity Risk Score: Flowsheet Row [facility]Current[facility] Most recent reading at 07/18/2022  6:00 PM ED from 07/17/2022 in [facility] Most recent reading at 07/17/2022  2:39 PM ED from 07/17/2022 in [facility] Emergency Department at [facility] Most recent reading at 07/17/2022 12:28 AM  C-SSRS RISK CATEGORY No Risk No Risk No Risk       Last PHQ 2/9 Scores:    05/24/2022   11:33 AM 08/25/2020    5:33 PM 08/20/2020    9:13 AM  Depression screen PHQ 2/9  Decreased Interest 0 0  0  Down, Depressed, Hopeless 0 0 0  PHQ - 2 Score 0 0 0  Altered sleeping 0  0  Tired, decreased energy 0  0  Change in appetite 0  0  Feeling bad or failure about yourself  0  0  Trouble concentrating 0  0  Moving slowly or fidgety/restless 0  0  Suicidal thoughts 0  0  PHQ-9 Score 0  0  Difficult doing work/chores Not difficult  at all      Scribe for Treatment Team: Isabella Bowens, Theresia Majors 07/19/2022 11:50 AM

## 2022-07-19 NOTE — BHH Group Notes (Signed)
Adult Psychoeducational Group Note  Date:  07/19/2022 Time:  8:38 PM  Group Topic/Focus:  Wrap-Up Group:   The focus of this group is to help patients review their daily goal of treatment and discuss progress on daily workbooks.  Participation Level:  Minimal  Participation Quality:  Resistant  Affect:  Defensive, Irritable, Labile, and Not Congruent  Cognitive:  Disorganized and Delusional  Insight: Lacking  Engagement in Group:  Limited and Resistant  Modes of Intervention:  Discussion  Additional Comments:   Pt initially began talking about how she had an "okay" day but was upset bc her boyfriend had to leave after visit and some of the other girls on the unit have been negative towards her. Pt states "its too much stress for me and my child", while holding her stomach. Writer asked pt if she was pregnant and pt became increasingly agitated refusing to speak with Clinical research associate and communicate the rest of group.   Vevelyn Pat 07/19/2022, 8:38 PM

## 2022-07-19 NOTE — BHH Suicide Risk Assessment (Signed)
[facility] INPATIENT:  Family/Significant Other Suicide Prevention Education  Suicide Prevention Education:  Education Completed; Demorris Sherlie Ban, partner, [phone] has been identified by the patient as the family member/significant other with whom the patient will be residing, and identified as the person(s) who will aid the patient in the event of a mental health crisis (suicidal ideations/suicide attempt).  With written consent from the patient, the family member/significant other has been provided the following suicide prevention education, prior to the and/or following the discharge of the patient.  The suicide prevention education provided includes the following: Suicide risk factors Suicide prevention and interventions National Suicide Hotline telephone number [facility] assessment telephone number [facility] Emergency Assistance 911 [facility] and/or Residential Mobile Crisis Unit telephone number  Request made of family/significant other to: Remove weapons (e.g., guns, rifles, knives), all items previously/currently identified as safety concern.   Remove drugs/medications (over-the-counter, prescriptions, illicit drugs), all items previously/currently identified as a safety concern.  The family member/significant other verbalizes understanding of the suicide prevention education information provided.  The family member/significant other agrees to remove the items of safety concern listed above.  Corky Crafts 07/19/2022, 4:19 PM
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
## 2022-07-19 NOTE — Progress Notes (Signed)
Recreation Therapy Notes  INPATIENT RECREATION THERAPY ASSESSMENT  Patient Details Name: Cheryl Cohen MRN: [id number] DOB: 1990-07-22 Today's Date: 07/19/2022       Information Obtained From: Patient  Able to Participate in Assessment/Interview: Yes  Patient Presentation: Alert  Reason for Admission (Per Patient): Other (Comments) (Pt stated her family and child's father.)  Patient Stressors: Relationship (with child's father)  Coping Skills:   TV, Sports, Exercise, Deep Breathing, Meditate, Music, Substance Abuse, Talk, Art, Prayer, Read, Hot Bath/Shower  Leisure Interests (2+):  Music - Write music, Art - Draw, Music - Singing, Individual - Other (Comment), Individual - TV, Music - Other (Comment) (make goal plans; dancing)  Frequency of Recreation/Participation: Other (Comment) (Daily)  Awareness of Community Resources:  Yes  Community Resources:  [place], Public affairs consultant, Engineering geologist  Current Use: Yes  If no, Barriers?:    Expressed Interest in [employer] Information: No  [employer] of Residence:  Engineer, technical sales  Patient Main Form of Transportation: Set designer  Patient Strengths:  "being able to have confidence in myself, I'm a strong mother and keep prevailing"  Patient Identified Areas of Improvement:  "take medication consistantly"  Patient Goal for Hospitalization:  "to be able to continue to use coping strategies in life in general and maintain growing with the person I truly love"  Current SI (including self-harm):  No  Current HI:  No  Current AVH: No  Staff Intervention Plan: Group Attendance, Collaborate with Interdisciplinary Treatment Team  Consent to Intern Participation: N/A   Mazelle Huebert-McCall, LRT,CTRS Magen Suriano A Loki Wuthrich-McCall 07/19/2022, 1:46 PM
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
## 2022-07-19 NOTE — Group Note (Signed)
Recreation Therapy Group Note   Group Topic:Personal Development  Group Date: 07/19/2022 Start Time: 1011 End Time: 1051 Facilitators: Naomia Lenderman-McCall, LRT,CTRS Location: [address] Dayroom   Goal Area(s) Addresses:  Patient will successfully identify positive attributes about themselves.  Patient will identify healthy ways to increase personal development. Patient will acknowledge benefit(s) of developing their character.   Group Description:  [place].  LRT and patients played a game in the same style as [place].  Except in this game, each block was a different color (red, orange, green and blue).  Each colored corresponds with a question.  Which ever color patient picked, they were asked a question that matched the color.    Affect/Mood: Appropriate   Participation Level: Engaged   Participation Quality: Independent   Behavior: Appropriate   Speech/Thought Process: Focused   Insight: Good   Judgement: Good   Modes of Intervention: Activity   Patient Response to Interventions:  Engaged   Education Outcome:  In group clarification offered    Clinical Observations/Individualized Feedback: Pt was bright and engaged throughout activity.  Pt shared in the activity that she doesn't spend enough time focusing on herself because she puts more time and energy into others.  When asked what area in her life she would like more control over, pt stated she doesn't have total control of her life because she has faith "in the most high".  Pt also expressed one of the traits she looks for in a role model is their positive energy.  Pt also offered advice when telling when someone is being honest.  Pt shared being able to tell by the energy a person has can be a sign of their character.  Pt was appropriate throughout group session.     Plan: Continue to engage patient in RT group sessions 2-3x/week.   Margie Urbanowicz-McCall, LRT,CTRS 07/19/2022 1:15 PM

## 2022-07-20 DIAGNOSIS — F312 Bipolar disorder, current episode manic severe with psychotic features: Secondary | ICD-10-CM | POA: Diagnosis not present

## 2022-07-20 MED ORDER — PROPRANOLOL HCL 10 MG PO TABS
10.0000 mg | ORAL_TABLET | Freq: Two times a day (BID) | ORAL | Status: DC
  Administered 2022-07-20 – 2022-07-22 (×4): 10 mg via ORAL
  Filled 2022-07-20 (×9): qty 1

## 2022-07-20 MED ORDER — WHITE PETROLATUM EX OINT
TOPICAL_OINTMENT | CUTANEOUS | Status: AC
  Filled 2022-07-20: qty 5

## 2022-07-20 NOTE — Group Note (Signed)
Occupational Therapy Group Note  Group Topic: Sleep Hygiene  Group Date: 07/20/2022 Start Time: 1405 End Time: 1435 Facilitators: Ted Mcalpine, OT   Group Description: Group encouraged increased participation and engagement through topic focused on sleep hygiene. Patients reflected on the quality of sleep they typically receive and identified areas that need improvement. Group was given background information on sleep and sleep hygiene, including common sleep disorders. Group members also received information on how to improve one's sleep and introduced a sleep diary as a tool that can be utilized to track sleep quality over a length of time. Group session ended with patients identifying one or more strategies they could utilize or implement into their sleep routine in order to improve overall sleep quality.        Therapeutic Goal(s):  Identify one or more strategies to improve overall sleep hygiene  Identify one or more areas of sleep that are negatively impacted (sleep too much, too little, etc)     Participation Level: Engaged   Participation Quality: Independent   Behavior: Cooperative   Speech/Thought Process: Loose association    Affect/Mood: Appropriate   Insight: Fair   Judgement: Fair   Individualization: pt was engaged in their participation of group discussion/activity. New skills were identified  Modes of Intervention: Education  Patient Response to Interventions:  Attentive   Plan: Continue to engage patient in OT groups 2 - 3x/week.  07/20/2022  Ted Mcalpine, OT   Kerrin Champagne, OT

## 2022-07-20 NOTE — Progress Notes (Addendum)
[facility] MD Progress Note  07/20/2022 4:42 PM Cheryl Cohen  MRN:  [id number] Principal Problem: Bipolar affective disorder, currently manic, severe, with psychotic features Diagnosis: Principal Problem:   Bipolar affective disorder, currently manic, severe, with psychotic features Active Problems:   Delta-9-tetrahydrocannabinol (THC) dependence   Generalized anxiety disorder   Insomnia  Reason For Admission: Cheryl Cohen is a 32 yo with a history of bipolar d/o who was taken to the [facility] health urgent care by her mother on 4/15 with complaints of manic type symptoms & delusions that she is pregnant in the context of medication non compliance; pt was  observed to be exhibiting behaviors such as laughing inappropriately & making religious statements while at the [facility]. She was not agreeable to voluntary admission and involuntarily committed by her mother prior to being transported to this [facility] for treatment and stabilization of her mental status.   24 hr chart review: Sleep Hours last night: 7.5 Nursing Concerns: none reported Behavioral episodes in the past 24 NWG:NFAO  Medication Compliance: compliant  Vital Signs in the past 24 hrs: HR recorded as 140 earlier today morning, nursing notified to recheck. PRN Medications in the past 24 hrs: none   Patient assessment note: Pt presents today with a less labile mood as compared to yesterday. Mood is also less depressed. She is visible in the day room participating in unit activities. Her attention to personal hygiene and grooming has improved, eye contact is good, speech is more coherent, more organized. Thought contents are more logical, and pt currently denies SI/HI/AVH.   Patient is able to admit during this assessment when asked by writer that she is not pregnant. She is still somewhat paranoid as she and presents with delusions of persecution to a lesser degree as compared to yesterday as she states that she believes that  every one is against her and actively plotting to harm her, and her child and her BF are the only ones who are not out to harm her.  Patient reports that her sleep quality last night was good, she reports a good appetite, denies being in any physical pain, denies medication related side effects, reports that she is tolerating the Risperdal well. We are continuing medications as listed below. Leaving Risperdal at 1 mg BID for right now since it was just started yesterday, and will watch to see if pt's paranoia resolves. He has made a lot of improvement so far.  Total Time spent with patient: 45 minutes  Past Psychiatric History: See H & P  Past Medical History:  Past Medical History:  Diagnosis Date   Anxiety    Bipolar 1 disorder    hospitalized  3 x onset rx in 9th grade    Depression    Gestational diabetes    Gonorrhea    Hx of varicella    Syphilis    Twin pregnancy, delivered vaginally, IUFD stillborns 09/09/2015   IUFD  22 + weeks  still borns     Past Surgical History:  Procedure Laterality Date   DILATION AND EVACUATION N/A 09/09/2015   Procedure: DILATATION AND EVACUATION;  Surgeon: [name], MD;  Location: [facility];  Service: Gynecology;  Laterality: N/A;   TYMPANOSTOMY TUBE PLACEMENT     asage 2-3 yrs   Family History:  Family History  Problem Relation Age of Onset   Diabetes Mother    Arthritis Mother    Hypertension Father    Alcohol abuse Father    Alcoholism  Father    Family Psychiatric  History: See H & P Social History:  Social History   Substance and Sexual Activity  Alcohol Use Yes     Social History   Substance and Sexual Activity  Drug Use Yes   Types: Marijuana    Social History   Socioeconomic History   Marital status: Single    Spouse name: Not on file   Number of children: Not on file   Years of education: Not on file   Highest education level: Some college, no degree  Occupational History    Comment: Freight forwarder on [employer]  Tobacco Use   Smoking status: Every Day    Types: Cigars   Smokeless tobacco: Former   Tobacco comments:    1 cigarette a day  Vaping Use   Vaping Use: Never used  Substance and Sexual Activity   Alcohol use: Yes   Drug use: Yes    Types: Marijuana   Sexual activity: Yes    Birth control/protection: None  Other Topics Concern   Not on file  Social History Narrative   hh of 4    Lives at home  [place] to [place]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  In past    Worked also [employer]   Pet dog   Neg tad some caffiene       Now living at home  Had been with vine   Social Determinants of Health   Financial Resource Strain: Not on file  Food Insecurity: No Food Insecurity (07/18/2022)   Hunger Vital Sign    Worried About Running Out of Food in the Last Year: Never true    Ran Out of Food in the Last Year: Never true  Transportation Needs: No Transportation Needs (07/18/2022)   PRAPARE - Administrator, Civil Service (Medical): No    Lack of Transportation (Non-Medical): No  Physical Activity: Not on file  Stress: Not on file  Social Connections: Not on file   Sleep: Good  Appetite:  Fair  Current Medications: Current Facility-Administered Medications  Medication Dose Route Frequency Provider Last Rate Last Admin   acetaminophen (TYLENOL) tablet 650 mg  650 mg Oral Q6H PRN Rankin, Shuvon B, NP       alum & mag hydroxide-simeth (MAALOX/MYLANTA) 200-200-20 MG/5ML suspension 30 mL  30 mL Oral Q4H PRN Rankin, Shuvon B, NP       alum & mag hydroxide-simeth (MAALOX/MYLANTA) 200-200-20 MG/5ML suspension 30 mL  30 mL Oral Q4H PRN Rankin, Shuvon B, NP       diphenhydrAMINE (BENADRYL) capsule 50 mg  50 mg Oral TID PRN Rankin, Shuvon B, NP       Or   diphenhydrAMINE (BENADRYL) injection 50 mg  50 mg Intramuscular TID PRN Rankin, Shuvon B, NP       haloperidol (HALDOL) tablet 5 mg  5 mg Oral Q6H PRN Ronae Noell, NP       Or   haloperidol lactate (HALDOL) injection 5  mg  5 mg Intramuscular Q6H PRN Hilarie Sinha, NP       hydrOXYzine (ATARAX) tablet 25 mg  25 mg Oral TID PRN Rankin, Shuvon B, NP       lithium carbonate (ESKALITH) ER tablet 450 mg  450 mg Oral QHS Paton Crum, NP   450 mg at 07/19/22 2044   LORazepam (ATIVAN) tablet 1 mg  1 mg Oral TID PRN Rankin, Shuvon B, NP  Or   LORazepam (ATIVAN) injection 1 mg  1 mg Intramuscular TID PRN Rankin, Shuvon B, NP       magnesium hydroxide (MILK OF MAGNESIA) suspension 30 mL  30 mL Oral Daily PRN Rankin, Shuvon B, NP       magnesium hydroxide (MILK OF MAGNESIA) suspension 30 mL  30 mL Oral Daily PRN Rankin, Shuvon B, NP       propranolol (INDERAL) tablet 10 mg  10 mg Oral BID Elberta Lachapelle, NP       risperiDONE (RISPERDAL) tablet 1 mg  1 mg Oral BID Ahmaya Ostermiller, NP   1 mg at 07/20/22 4098   traZODone (DESYREL) tablet 50 mg  50 mg Oral QHS PRN Starleen Blue, NP        Lab Results: No results found for this or any previous visit (from the past 48 hour(s)).  Blood Alcohol level:  Lab Results  Component Value Date   ETH <10 07/17/2022   ETH <10 07/17/2022    Metabolic Disorder Labs: Lab Results  Component Value Date   HGBA1C 5.4 07/17/2022   MPG 108.28 07/17/2022   MPG 111 02/22/2016   Lab Results  Component Value Date   PROLACTIN 92.6 (H) 02/22/2016   PROLACTIN  10/31/2009    35.4 (NOTE)     Reference Ranges:                 Female:                       2.1 -  17.1 ng/ml                 Female:   Pregnant          9.7 - 208.5 ng/mL                           Non Pregnant      2.8 -  29.2 ng/mL                           Post  Menopausal   1.8 -  20.3 ng/mL                     Lab Results  Component Value Date   CHOL 134 07/17/2022   TRIG 37 07/17/2022   HDL 70 07/17/2022   CHOLHDL 1.9 07/17/2022   VLDL 7 07/17/2022   LDLCALC 57 07/17/2022   LDLCALC 62 02/22/2016    Physical Findings: AIMS:  , ,  ,  ,    CIWA:    COWS:     Musculoskeletal: Strength & Muscle Tone: within  normal limits Gait & Station: normal Patient leans: N/A  Psychiatric Specialty Exam:  Presentation  General Appearance:  Appropriate for Environment; Fairly Groomed  Eye Contact: Good  Speech: Clear and Coherent  Speech Volume: Normal  Handedness: Right   Mood and Affect  Mood: Euthymic  Affect: Congruent   Thought Process  Thought Processes: Coherent  Descriptions of Associations:Intact  Orientation:Full (Time, Place and Person)  Thought Content:Logical  History of Schizophrenia/Schizoaffective disorder:Yes  Duration of Psychotic Symptoms:Greater than six months  Hallucinations:Hallucinations: None  Ideas of Reference:Paranoia; Delusions  Suicidal Thoughts:Suicidal Thoughts: No  Homicidal Thoughts:Homicidal Thoughts: No   Sensorium  Memory: Immediate Good  Judgment: Poor  Insight: Poor   Executive Functions  Concentration: Fair  Attention Span: Fair  Recall: Jennelle Human of Knowledge: Fair  Language: Fair   Psychomotor Activity  Psychomotor Activity: Psychomotor Activity: Normal   Assets  Assets: Communication Skills   Sleep  Sleep: Sleep: Good    Physical Exam: Physical Exam Constitutional:      Appearance: Normal appearance.  HENT:     Nose: Nose normal.  Eyes:     Pupils: Pupils are equal, round, and reactive to light.  Musculoskeletal:     Cervical back: Normal range of motion.  Neurological:     Mental Status: She is alert and oriented to person, place, and time.    Review of Systems  Constitutional: Negative.   HENT: Negative.    Eyes: Negative.   Respiratory: Negative.    Cardiovascular: Negative.   Gastrointestinal:  Negative for heartburn.  Genitourinary: Negative.   Musculoskeletal: Negative.   Skin: Negative.   Neurological:  Negative for dizziness.  Psychiatric/Behavioral:  Positive for depression, hallucinations and substance abuse. Negative for memory loss and suicidal ideas. The  patient is nervous/anxious and has insomnia.    Blood pressure 100/69, pulse (!) 115, temperature 98.1 F (36.7 C), temperature source Oral, resp. rate 16, height 5\' 7"  (1.702 m), weight 60.3 kg, last menstrual period 06/30/2022, SpO2 97 %, unknown if currently breastfeeding. Body mass index is 20.83 kg/m.  Treatment Plan Summary: Daily contact with patient to assess and evaluate symptoms and progress in treatment and Medication management   Observation Level/Precautions:  15 minute checks  Laboratory:  Labs reviewed   Psychotherapy:  Unit Group sessions  Medications:  See [facility]  Consultations:  To be determined   Discharge Concerns:  Safety, medication compliance, mood stability  Estimated LOS: 5-7 days  Other:  N/A    PLAN Safety and Monitoring: Voluntary admission to inpatient psychiatric unit for safety, stabilization and treatment Daily contact with patient to assess and evaluate symptoms and progress in treatment Patient's case to be discussed in multi-disciplinary team meeting Observation Level : q15 minute checks Vital signs: q12 hours Precautions: Safety   Long Term Goal(s): Improvement in symptoms so as ready for discharge   Short Term Goals: Ability to identify changes in lifestyle to reduce recurrence of condition will improve, Ability to disclose and discuss suicidal ideas, Ability to demonstrate self-control will improve, Ability to identify and develop effective coping behaviors will improve, Ability to maintain clinical measurements within normal limits will improve, Compliance with prescribed medications will improve, and Ability to identify triggers associated with substance abuse/mental health issues will improve   Diagnoses Principal Problem:   Bipolar affective disorder, currently manic, severe, with psychotic features Active Problems:   Delta-9-tetrahydrocannabinol (THC) dependence   Generalized anxiety disorder   Insomnia   Medications -Start Inderal 10 mg  BID for tachycardia -Continue Risperdal 1 mg BID for mood stabilization -Continue Lithium to 450 mg nightly for mood stabilization (Level on 4/15 was 0.07, to be drawn again on 4/21) -Continue hydroxyzine 25 mg 3 times daily as needed for anxiety -Continue trazodone 50 mg as needed nightly for sleep -Continue agitation protocol medications: Benadryl 50 mg IM or p.o./Ativan 1 mg IM or p.o./Haldol 5 mg IM or p.o. 3 times daily as needed   Other PRNS -Continue Tylenol 650 mg every 6 hours PRN for mild pain -Continue Maalox 30 mg every 4 hrs PRN for indigestion -Continue Milk of Magnesia as needed every 6 hrs for constipation   Discharge Planning: Social work and case management to assist with discharge planning and identification of hospital  follow-up needs prior to discharge Estimated LOS: 5-7 days Discharge Concerns: Need to establish a safety plan; Medication compliance and effectiveness Discharge Goals: Return home with outpatient referrals for mental health follow-up including medication management/psychotherapy   I certify that inpatient services furnished can reasonably be expected to improve the patient's condition.    Starleen Blue, NP 07/20/2022, 4:42 PM

## 2022-07-20 NOTE — Group Note (Signed)
Date:  07/20/2022 Time:  7:04 PM  Group Topic/Focus:  Goals Group:   The focus of this group is to help patients establish daily goals to achieve during treatment and discuss how the patient can incorporate goal setting into their daily lives to aide in recovery. Orientation:   The focus of this group is to educate the patient on the purpose and policies of crisis stabilization and provide a format to answer questions about their admission.  The group details unit policies and expectations of patients while admitted.    Participation Level:  Active  Participation Quality:  Appropriate  Affect:  Appropriate  Cognitive:  Appropriate  Insight: Appropriate  Engagement in Group:  Engaged  Modes of Intervention:  Activity, Orientation, and Rapport Building  Additional Comments:   Pt attended and actively participated in the Orientation/Goals group. Pt personal goal is to express her feelings to her doctor and mother more and to get back home to her daughter.  [name] 07/20/2022, 7:04 PM

## 2022-07-20 NOTE — Progress Notes (Addendum)
   07/20/22 1100  Spiritual Encounters  Type of Visit Initial  Care provided to: Patient  Referral source Chaplain assessment;Chaplain team  Reason for visit Urgent spiritual support  OnCall Visit No  Spiritual Framework  Presenting Themes Meaning/purpose/sources of inspiration;Values and beliefs;Coping tools;Impactful experiences and emotions  Patient Stress Factors Family relationships;Loss of control  Family Stress Factors None identified  Interventions  Spiritual Care Interventions Made Reflective listening;Compassionate presence;Established relationship of care and support;Normalization of emotions   Met with Cheryl Cohen today in group room at [facility] = she expressed her eyearing for spiritual discernment and care at this time.  She expressed her emotions regarding stress of beign at [facility] at this tinme.  She spoke of her desire to complete her treatment and move forward into her life.  She exhibited somewhat level of hyper-spirituality comments during visit.  She spoke of her name and its meaning and garnered strength from this supportive discussion.  Ended visit with a departing blessing.

## 2022-07-20 NOTE — Group Note (Signed)
Date:  07/20/2022 Time:  8:49 PM  Group Topic/Focus:  Wrap-Up Group:   The focus of this group is to help patients review their daily goal of treatment and discuss progress on daily workbooks.    Participation Level:  Active  Participation Quality:  Appropriate  Affect:  Appropriate  Cognitive:  Appropriate  Insight: Appropriate  Engagement in Group:  Engaged  Modes of Intervention:  Education and Exploration  Additional Comments:  Patient attended and participated in group tonight.  She reports that today she learn to stay positive, don't let anything deter you from what you want to accomplish, stay grounded and uplifted.  Lita Mains [facility] 07/20/2022, 8:49 PM P

## 2022-07-20 NOTE — Progress Notes (Signed)
Pt attending group. Pt took scheduled morning medications. Pt continues to believe she is pregnant. Pt blames her childs father for hospitalization stating he is "trying to mess up my current relationship." Pt denies SI/HI/self harm thoughts as well as a/v hallucinations.Q 15 minute checks ongoing.

## 2022-07-20 NOTE — Progress Notes (Signed)
   07/20/22 1914  15 Minute Checks  Location Hallway;Nurses Station  Visual Appearance Calm  Behavior Composed  Sleep (Behavioral Health Patients Only)  Calculate sleep? (Click Yes once per 24 hr at 0600 safety check) Yes  Documented sleep last 24 hours 7.5

## 2022-07-20 NOTE — Progress Notes (Signed)
   07/19/22 2200  Psych Admission Type (Psych Patients Only)  Admission Status Involuntary  Psychosocial Assessment  Patient Complaints None  Eye Contact Fair  Facial Expression Animated  Affect Apprehensive;Preoccupied  Speech Tangential  Interaction Cautious;Forwards little  Motor Activity Other (Comment)  Appearance/Hygiene Unremarkable  Behavior Characteristics Cooperative;Appropriate to situation  Mood Preoccupied;Pleasant  Aggressive Behavior  Effect No apparent injury  Thought Process  Coherency Circumstantial  Content Blaming others  Delusions Paranoid;Grandeur  Perception WDL  Hallucination None reported or observed  Judgment Impaired  Confusion Mild  Danger to Self  Current suicidal ideation? Denies  Agreement Not to Harm Self Yes  Description of Agreement Verbal  Danger to Others  Danger to Others None reported or observed

## 2022-07-20 NOTE — Group Note (Signed)
Recreation Therapy Group Note   Group Topic:Leisure Education  Group Date: 07/20/2022 Start Time: 1000 End Time: 1036 Facilitators: Terrez Ander-McCall, LRT,CTRS Location: 500 Hall Dayroom   Goal Area(s) Addresses:  Patient will successfully identify positive leisure and recreation activities.  Patient will acknowledge benefits of participation in healthy leisure activities post discharge.  Patient will actively work with peers toward a shared goal.   Group Description: Pictionary. In groups of 5-7, patients took turns trying to guess the picture being drawn on the board by their teammate.  If the team guessed the correct answer, they won a point.  If the team guessed wrong, the other team got a chance to steal the point. After several rounds of game play, the team with the most points were declared winners. Post-activity discussion reviewed benefits of positive recreation outlets: reducing stress, improving coping mechanisms, increasing self-esteem, and building larger support systems.   Affect/Mood: Appropriate   Participation Level: Engaged   Participation Quality: Independent   Behavior: Appropriate   Speech/Thought Process: Focused   Insight: Good   Judgement: Good   Modes of Intervention: Competitive Play   Patient Response to Interventions:  Engaged   Education Outcome:  In group clarification offered    Clinical Observations/Individualized Feedback: Pt identified leisure as being able to take time to do things and relax.  Pt was appropriate during group session.  Pt was very engaged and interactive during group session.  Pt would cheer on peers and do her best to guess the pictures being drawn.  Pt was also very talented with how she drew her pictures.    Plan: Continue to engage patient in RT group sessions 2-3x/week.   Cheryl Cohen, LRT,CTRS 07/20/2022 12:29 PM

## 2022-07-21 DIAGNOSIS — F312 Bipolar disorder, current episode manic severe with psychotic features: Secondary | ICD-10-CM | POA: Diagnosis not present

## 2022-07-21 NOTE — Progress Notes (Signed)
   07/21/22 2100  Psych Admission Type (Psych Patients Only)  Admission Status Involuntary  Psychosocial Assessment  Patient Complaints None  Eye Contact Fair  Facial Expression Animated  Affect Anxious;Preoccupied  Speech Logical/coherent  Interaction Assertive  Motor Activity Fidgety  Appearance/Hygiene Unremarkable  Behavior Characteristics Cooperative  Mood Preoccupied;Pleasant  Aggressive Behavior  Effect No apparent injury  Thought Process  Coherency WDL  Content Blaming others  Delusions Paranoid;Persecutory  Perception WDL  Hallucination None reported or observed  Judgment Impaired  Confusion None  Danger to Self  Current suicidal ideation? Denies  Agreement Not to Harm Self Yes  Description of Agreement Verbal contract  Danger to Others  Danger to Others None reported or observed

## 2022-07-21 NOTE — Progress Notes (Signed)
   07/21/22 0639  15 Minute Checks  Location Hallway  Visual Appearance Calm  Behavior Composed  Sleep (Behavioral Health Patients Only)  Calculate sleep? (Click Yes once per 24 hr at 0600 safety check) Yes  Documented sleep last 24 hours 7.75

## 2022-07-21 NOTE — Progress Notes (Signed)
[facility] MD Progress Note  07/21/2022 8:43 AM Cheryl Cohen  MRN:  [id number] Principal Problem: Bipolar affective disorder, currently manic, severe, with psychotic features Diagnosis: Principal Problem:   Bipolar affective disorder, currently manic, severe, with psychotic features Active Problems:   Delta-9-tetrahydrocannabinol (THC) dependence   Generalized anxiety disorder   Insomnia  Reason For Admission: Cheryl Cohen is a 32 yo with a history of bipolar d/o who was taken to the [facility] health urgent care by her mother on 4/15 with complaints of manic type symptoms & delusions that she is pregnant in the context of medication non compliance; pt was  observed to be exhibiting behaviors such as laughing inappropriately & making religious statements while at the [facility] She was not agreeable to voluntary admission and involuntarily committed by her mother prior to being transported to this [facility] for treatment and stabilization of her mental status.   24 hr chart review: Staff report patient remains delusional telling staff and other patients she is pregnant. She has been visible in milieu attending unit groups and activities. Medication compliant; no PRNs given overnight. Vital signs remains within normal limits. Slept 7.75 hours overnight.    Patient assessment note: Pt presents today in dayroom talking with other peers and staff. Casual appearance. Cooperative. Stable eye contact. speech is slightly pressured. She presents circumstantial with some tangentiality; hyper-religious themes noted. Mentions being 'saved' and 'understanding that God has a purpose over my life'. She mentions plans of attending Nursing school that was interrupted 'by my mental illness but I'm going back. I'm not going to let my mental illness stop me. I can do all things through [facility]'. Provider observed patient rubbing her stomach area several times, when asked about it she denies any concerns. No mentions  of being pregnant. She reports reason for admission being 'my mental health' and mentions living with her child (23 year old son) and his father who she states 'doesn't understand my mental health and I felt he was provoking me. I went a few weeks without sleep'. She identifies her mother as an active support mentioning her mother being a Midwife who has 'dealt with a lot' she then begins talking about her father's history of addiction and recently having a stroke. She is able to be redirected during assessment; endorses medication compliance and 'feeling better'. She denies SI/HI/AVH or paranoia. Reports improved sleep and 'good' appetite. Her thoughts appear less delusional with no obvious signs of paranoia or psychosis. Care discussed with attending MD; treatment plan noted below.    Total Time spent with patient: 45 minutes  Past Psychiatric History: See H & P  Past Medical History:  Past Medical History:  Diagnosis Date   Anxiety    Bipolar 1 disorder    hospitalized  3 x onset rx in 9th grade    Depression    Gestational diabetes    Gonorrhea    Hx of varicella    Syphilis    Twin pregnancy, delivered vaginally, IUFD stillborns 09/09/2015   IUFD  22 + weeks  still borns     Past Surgical History:  Procedure Laterality Date   DILATION AND EVACUATION N/A 09/09/2015   Procedure: DILATATION AND EVACUATION;  Surgeon: [name], MD;  Location: [facility];  Service: Gynecology;  Laterality: N/A;   TYMPANOSTOMY TUBE PLACEMENT     asage 2-3 yrs   Family History:  Family History  Problem Relation Age of Onset   Diabetes Mother    Arthritis  Mother    Hypertension Father    Alcohol abuse Father    Alcoholism Father    Family Psychiatric  History: See H & P Social History:  Social History   Substance and Sexual Activity  Alcohol Use Yes     Social History   Substance and Sexual Activity  Drug Use Yes   Types: Marijuana    Social History   Socioeconomic History    Marital status: Single    Spouse name: Not on file   Number of children: Not on file   Years of education: Not on file   Highest education level: Some college, no degree  Occupational History    Comment: Geophysicist/field seismologist on [employer]  Tobacco Use   Smoking status: Every Day    Types: Cigars   Smokeless tobacco: Former   Tobacco comments:    1 cigarette a day  Vaping Use   Vaping Use: Never used  Substance and Sexual Activity   Alcohol use: Yes   Drug use: Yes    Types: Marijuana   Sexual activity: Yes    Birth control/protection: None  Other Topics Concern   Not on file  Social History Narrative   hh of 4    Lives at home  [employer] to [place]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  In past    Worked also [employer]   Pet dog   Neg tad some caffiene       Now living at home  Had been with vine   Social Determinants of Health   Financial Resource Strain: Not on file  Food Insecurity: No Food Insecurity (07/18/2022)   Hunger Vital Sign    Worried About Running Out of Food in the Last Year: Never true    Ran Out of Food in the Last Year: Never true  Transportation Needs: No Transportation Needs (07/18/2022)   PRAPARE - Administrator, Civil Service (Medical): No    Lack of Transportation (Non-Medical): No  Physical Activity: Not on file  Stress: Not on file  Social Connections: Not on file   Sleep: Good  Appetite:  Fair  Current Medications: Current Facility-Administered Medications  Medication Dose Route Frequency Provider Last Rate Last Admin   acetaminophen (TYLENOL) tablet 650 mg  650 mg Oral Q6H PRN Rankin, Shuvon B, NP       alum & mag hydroxide-simeth (MAALOX/MYLANTA) 200-200-20 MG/5ML suspension 30 mL  30 mL Oral Q4H PRN Rankin, Shuvon B, NP       alum & mag hydroxide-simeth (MAALOX/MYLANTA) 200-200-20 MG/5ML suspension 30 mL  30 mL Oral Q4H PRN Rankin, Shuvon B, NP       diphenhydrAMINE (BENADRYL) capsule 50 mg  50 mg Oral TID PRN Rankin, Shuvon B,  NP       Or   diphenhydrAMINE (BENADRYL) injection 50 mg  50 mg Intramuscular TID PRN Rankin, Shuvon B, NP       haloperidol (HALDOL) tablet 5 mg  5 mg Oral Q6H PRN Nkwenti, Doris, NP       Or   haloperidol lactate (HALDOL) injection 5 mg  5 mg Intramuscular Q6H PRN Nkwenti, Doris, NP       hydrOXYzine (ATARAX) tablet 25 mg  25 mg Oral TID PRN Rankin, Shuvon B, NP       lithium carbonate (ESKALITH) ER tablet 450 mg  450 mg Oral QHS Nkwenti, Doris, NP   450 mg at 07/20/22 2054   LORazepam (ATIVAN) tablet 1  mg  1 mg Oral TID PRN Rankin, Shuvon B, NP       Or   LORazepam (ATIVAN) injection 1 mg  1 mg Intramuscular TID PRN Rankin, Shuvon B, NP       magnesium hydroxide (MILK OF MAGNESIA) suspension 30 mL  30 mL Oral Daily PRN Rankin, Shuvon B, NP       magnesium hydroxide (MILK OF MAGNESIA) suspension 30 mL  30 mL Oral Daily PRN Rankin, Shuvon B, NP       propranolol (INDERAL) tablet 10 mg  10 mg Oral BID Starleen Blue, NP   10 mg at 07/21/22 1610   risperiDONE (RISPERDAL) tablet 1 mg  1 mg Oral BID Starleen Blue, NP   1 mg at 07/21/22 9604   traZODone (DESYREL) tablet 50 mg  50 mg Oral QHS PRN Starleen Blue, NP        Lab Results: No results found for this or any previous visit (from the past 48 hour(s)).  Blood Alcohol level:  Lab Results  Component Value Date   ETH <10 07/17/2022   ETH <10 07/17/2022    Metabolic Disorder Labs: Lab Results  Component Value Date   HGBA1C 5.4 07/17/2022   MPG 108.28 07/17/2022   MPG 111 02/22/2016   Lab Results  Component Value Date   PROLACTIN 92.6 (H) 02/22/2016   PROLACTIN  10/31/2009    35.4 (NOTE)     Reference Ranges:                 Female:                       2.1 -  17.1 ng/ml                 Female:   Pregnant          9.7 - 208.5 ng/mL                           Non Pregnant      2.8 -  29.2 ng/mL                           Post  Menopausal   1.8 -  20.3 ng/mL                     Lab Results  Component Value Date   CHOL 134  07/17/2022   TRIG 37 07/17/2022   HDL 70 07/17/2022   CHOLHDL 1.9 07/17/2022   VLDL 7 07/17/2022   LDLCALC 57 07/17/2022   LDLCALC 62 02/22/2016    Physical Findings: AIMS:  , ,  ,  ,    CIWA:    COWS:     Musculoskeletal: Strength & Muscle Tone: within normal limits Gait & Station: normal Patient leans: N/A  Psychiatric Specialty Exam:  Presentation  General Appearance:  Appropriate for Environment; Fairly Groomed  Eye Contact: Good  Speech: Clear and Coherent  Speech Volume: Normal  Handedness: Right  Mood and Affect  Mood: Euthymic  Affect: Congruent  Thought Process  Thought Processes: Coherent  Descriptions of Associations:Intact  Orientation:Full (Time, Place and Person)  Thought Content:Logical  History of Schizophrenia/Schizoaffective disorder:Yes  Duration of Psychotic Symptoms:Greater than six months  Hallucinations:No data recorded  Ideas of Reference:Paranoia; Delusions  Suicidal Thoughts:Suicidal Thoughts: No  Homicidal Thoughts:Homicidal Thoughts: No  Sensorium  Memory: Immediate Good  Judgment: Poor  Insight: Poor  Art therapist  Concentration: Fair  Attention Span: Fair  Recall: [employer] of Knowledge: Fair  Language: Fair  Psychomotor Activity  Psychomotor Activity: Psychomotor Activity: Normal  Assets  Assets: Communication Skills  Sleep  Sleep: Sleep: Good  Physical Exam: Physical Exam Constitutional:      Appearance: Normal appearance. She is normal weight.  HENT:     Nose: Nose normal.  Eyes:     Pupils: Pupils are equal, round, and reactive to light.  Musculoskeletal:     Cervical back: Normal range of motion.  Neurological:     Mental Status: She is alert and oriented to person, place, and time.  Psychiatric:        Attention and Perception: She does not perceive auditory or visual hallucinations.        Mood and Affect: Affect is inappropriate.        Speech: Speech is  tangential.        Behavior: Behavior is cooperative.        Thought Content: Thought content does not include homicidal or suicidal ideation. Thought content does not include homicidal or suicidal plan.        Cognition and Memory: Cognition and memory normal.        Judgment: Judgment is inappropriate.    Review of Systems  Constitutional: Negative.   HENT: Negative.    Eyes: Negative.   Respiratory: Negative.    Cardiovascular: Negative.   Gastrointestinal:  Negative for heartburn.  Genitourinary: Negative.   Musculoskeletal: Negative.   Skin: Negative.   Neurological:  Negative for dizziness.  Psychiatric/Behavioral:  Positive for substance abuse. Negative for depression, hallucinations, memory loss and suicidal ideas. The patient is not nervous/anxious and does not have insomnia.    Blood pressure 101/68, pulse (!) 110, temperature 98 F (36.7 C), temperature source Oral, resp. rate 14, height 5\' 7"  (1.702 m), weight 60.3 kg, last menstrual period 06/30/2022, SpO2 97 %, unknown if currently breastfeeding. Body mass index is 20.83 kg/m.  Treatment Plan Summary: Daily contact with patient to assess and evaluate symptoms and progress in treatment and Medication management   Observation Level/Precautions:  15 minute checks  Laboratory:  Labs reviewed   Psychotherapy:  Unit Group sessions  Medications:  See [facility]  Consultations:  To be determined   Discharge Concerns:  Safety, medication compliance, mood stability  Estimated LOS: 5-7 days  Other:  N/A    PLAN Safety and Monitoring: Voluntary admission to inpatient psychiatric unit for safety, stabilization and treatment Daily contact with patient to assess and evaluate symptoms and progress in treatment Patient's case to be discussed in multi-disciplinary team meeting Observation Level : q15 minute checks Vital signs: q12 hours Precautions: Safety   Long Term Goal(s): Improvement in symptoms so as ready for discharge    Short Term Goals: Ability to identify changes in lifestyle to reduce recurrence of condition will improve, Ability to disclose and discuss suicidal ideas, Ability to demonstrate self-control will improve, Ability to identify and develop effective coping behaviors will improve, Ability to maintain clinical measurements within normal limits will improve, Compliance with prescribed medications will improve, and Ability to identify triggers associated with substance abuse/mental health issues will improve   Diagnoses Principal Problem:   Bipolar affective disorder, currently manic, severe, with psychotic features Active Problems:   Delta-9-tetrahydrocannabinol (THC) dependence   Generalized anxiety disorder   Insomnia   Medications:  Continue:  - Inderal 10 mg BID for tachycardia -  Risperdal 1 mg BID for mood stabilization - Lithium to 450 mg nightly for mood stabilization (Level on 4/15 was 0.07, to be drawn again on 4/21)   - Agitation Protocol: Benadryl 50 mg IM/PO, Ativan 1 mg IM/PO, Haldol 5 mg IM/PO 3 times daily as needed   Other PRNS - Tylenol 650 mg every 6 hours PRN for mild pain - Maalox 30 mg every 4 hrs PRN for indigestion - Milk of Magnesia as needed every 6 hrs for constipation - Hydroxyzine 25 mg 3 times daily as needed for anxiety - Trazodone 50 mg as needed nightly for sleep  Discharge Planning: Social work and case management to assist with discharge planning and identification of hospital follow-up needs prior to discharge Estimated LOS: 5-7 days Discharge Concerns: Need to establish a safety plan; Medication compliance and effectiveness Discharge Goals: Return home with outpatient referrals for mental health follow-up including medication management/psychotherapy   I certify that inpatient services furnished can reasonably be expected to improve the patient's condition.    Loletta Parish, NP 07/21/2022, 8:43 AM  Patient ID: Cheryl Cohen, female    DOB: May 05, 1990, 32 y.o.   MRN: [id number]

## 2022-07-21 NOTE — Group Note (Signed)
Date:  07/21/2022 Time:  9:21 PM  Group Topic/Focus:  Wrap-Up Group:   The focus of this group is to help patients review their daily goal of treatment and discuss progress on daily workbooks.    Participation Level:  Active  Participation Quality:  Appropriate  Affect:  Appropriate  Cognitive:  Appropriate  Insight: Appropriate  Engagement in Group:  Engaged  Modes of Intervention:  Education and Exploration  Additional Comments:  Patient attended and participated in group tonight. She reports that she had a wonderful day. She listened to music and dance.  She went outside to get fresh air and exercise. She enjoyed that groups they were good and the staff members are good.  Lita Mains [facility] 07/21/2022, 9:21 PM

## 2022-07-21 NOTE — Group Note (Signed)
Recreation Therapy Group Note   Group Topic:Problem Solving  Group Date: 07/21/2022 Start Time: 1005 End Time: 1026 Facilitators: Carmita Boom-McCall, LRT,CTRS Location: [address] Dayroom   Goal Area(s) Addresses:  Patient will effectively work with peer towards shared goal.  Patient will identify skills used to make activity successful.  Patient will identify how skills used during activity can be applied to reach post d/c goals.    Group Description: [employer]. In teams of 5-6, patients were given 11 craft pipe cleaners. Using the materials provided, patients were instructed to compete again the opposing team(s) to build the tallest free-standing structure from floor level. The activity was timed; difficulty increased by Clinical research associate as Production designer, theatre/television/film continued.  Systematically resources were removed with additional directions for example, placing one arm behind their back, working in silence, and shape stipulations. LRT facilitated post-activity discussion reviewing team processes and necessary communication skills involved in completion. Patients were encouraged to reflect how the skills utilized, or not utilized, in this activity can be incorporated to positively impact support systems post discharge.   Affect/Mood: Appropriate   Participation Level: Engaged   Participation Quality: Independent   Behavior: Appropriate   Speech/Thought Process: Focused   Insight: Good   Judgement: Good   Modes of Intervention: Problem-solving   Patient Response to Interventions:  Engaged   Education Outcome:  In group clarification offered    Clinical Observations/Individualized Feedback: Pt was engaged and appropriate during group session.  Pt worked well with her peers.  Pt shared you have to lift people up when they are struggling and encourage them to keep going.     Plan: Continue to engage patient in RT group sessions 2-3x/week.   Alliyah Roesler-McCall,  LRT,CTRS 07/21/2022 10:58 AM

## 2022-07-21 NOTE — Plan of Care (Signed)
  Problem: Education: Goal: Emotional status will improve Outcome: Progressing Goal: Verbalization of understanding the information provided will improve Outcome: Progressing   Problem: Coping: Goal: Ability to verbalize frustrations and anger appropriately will improve Outcome: Progressing   Problem: Health Behavior/Discharge Planning: Goal: Compliance with treatment plan for underlying cause of condition will improve Outcome: Progressing   Problem: Physical Regulation: Goal: Ability to maintain clinical measurements within normal limits will improve Outcome: Progressing   Problem: Education: Goal: Knowledge of the prescribed therapeutic regimen will improve Outcome: Progressing

## 2022-07-21 NOTE — Group Note (Signed)
Date:  07/21/2022 Time:  2:53 PM  Group Topic/Focus:  Dimensions of Wellness:   The focus of this group is to introduce the topic of wellness and discuss the role each dimension of wellness plays in total health.    Participation Level:  Active  Participation Quality:  Appropriate  Affect:  Appropriate  Cognitive:  Appropriate  Insight: Appropriate  Engagement in Group:  Engaged  Modes of Intervention:  Discussion  Additional Comments:  Patient attended group and participated.  Cheryl Cohen 07/21/2022, 2:53 PM

## 2022-07-21 NOTE — Group Note (Signed)
Date:  07/21/2022 Time:  1:50 PM  Group Topic/Focus:  Goals Group:   The focus of this group is to help patients establish daily goals to achieve during treatment and discuss how the patient can incorporate goal setting into their daily lives to aide in recovery. Orientation:   The focus of this group is to educate the patient on the purpose and policies of crisis stabilization and provide a format to answer questions about their admission.  The group details unit policies and expectations of patients while admitted.    Participation Level:  Active  Participation Quality:  Appropriate  Affect:  Appropriate  Cognitive:  Appropriate  Insight: Appropriate  Engagement in Group:  Engaged  Modes of Intervention:  Discussion  Additional Comments:  Patient attended morning orientation/goal group and said that her goal for today is to remain positive..Patient also fill up the group Self assessment tool worksheet.   Neida Ellegood W Maxxwell Edgett 07/21/2022, 1:50 PM

## 2022-07-21 NOTE — Progress Notes (Signed)
   07/20/22 2100  Psych Admission Type (Psych Patients Only)  Admission Status Involuntary  Psychosocial Assessment  Patient Complaints None  Eye Contact Fair  Facial Expression Animated  Affect Apprehensive  Speech Logical/coherent  Interaction Assertive  Motor Activity Other (Comment)  Appearance/Hygiene Unremarkable  Behavior Characteristics Cooperative  Mood Pleasant  Thought Process  Coherency WDL  Content Blaming others  Delusions Paranoid  Perception WDL  Hallucination None reported or observed  Judgment Impaired  Confusion None  Danger to Self  Current suicidal ideation? Denies  Agreement Not to Harm Self Yes  Description of Agreement Verbal  Danger to Others  Danger to Others None reported or observed

## 2022-07-21 NOTE — Plan of Care (Signed)
  Problem: Education: Goal: Knowledge of [place] [facility] Education information/materials will improve Outcome: Progressing Goal: Verbalization of understanding the information provided will improve Outcome: Progressing   Problem: Activity: Goal: Sleeping patterns will improve Outcome: Progressing   Problem: Health Behavior/Discharge Planning: Goal: Identification of resources available to assist in meeting health care needs will improve Outcome: Progressing   Problem: Safety: Goal: Periods of time without injury will increase Outcome: Progressing

## 2022-07-21 NOTE — [facility] Group Notes (Signed)
Spiritual care group facilitated by Chaplain Dyanne Carrel, [facility]  Group focused on topic of strength. Group members reflected on what thoughts and feelings emerge when they hear this topic. They then engaged in facilitated dialog around how strength is present in their lives. This dialog focused on representing what strength had been to them in their lives (images and patterns given) and what they saw as helpful in their life now (what they needed / wanted).  Activity drew on narrative framework.  Patient Progress: Cheryl Cohen attended group and actively engaged and participated in group conversation. She wants to be strong to be amother to her son and her faith helps her to do this.

## 2022-07-22 DIAGNOSIS — F312 Bipolar disorder, current episode manic severe with psychotic features: Secondary | ICD-10-CM | POA: Diagnosis not present

## 2022-07-22 LAB — LITHIUM LEVEL: Lithium Lvl: 1.06 mmol/L (ref 0.60–1.20)

## 2022-07-22 MED ORDER — PROPRANOLOL HCL 10 MG PO TABS
10.0000 mg | ORAL_TABLET | Freq: Three times a day (TID) | ORAL | Status: DC
  Administered 2022-07-22 – 2022-07-24 (×6): 10 mg via ORAL
  Filled 2022-07-22 (×11): qty 1

## 2022-07-22 NOTE — Progress Notes (Signed)
   07/22/22 0600  15 Minute Checks  Location Bedroom  Visual Appearance Calm  Behavior Sleeping  Sleep (Behavioral Health Patients Only)  Calculate sleep? (Click Yes once per 24 hr at 0600 safety check) Yes  Documented sleep last 24 hours 7.25

## 2022-07-22 NOTE — Progress Notes (Signed)
[facility] MD Progress Note  07/22/2022 2:45 PM [name]  MRN:  [id number] Principal Problem: Bipolar affective disorder, currently manic, severe, with psychotic features Diagnosis: Principal Problem:   Bipolar affective disorder, currently manic, severe, with psychotic features Active Problems:   Delta-9-tetrahydrocannabinol (THC) dependence   Generalized anxiety disorder   Insomnia  Reason For Admission: [name] is a 32 yo with a history of bipolar d/o who was taken to the [facility] health urgent care by her mother on 4/15 with complaints of manic type symptoms & delusions that she is pregnant in the context of medication non compliance; pt was  observed to be exhibiting behaviors such as laughing inappropriately & making religious statements while at the [facility]. She was not agreeable to voluntary admission and involuntarily committed by her mother prior to being transported to this [facility] for treatment and stabilization of her mental status.   24 hr chart review: Sleep Hours last night: 7.5 Nursing Concerns: none reported Behavioral episodes in the past 24 WUJ:WJXB  Medication Compliance: compliant  Vital Signs in the past 24 hrs: HR continues to have some periods of intermittent elevations.  PRN Medications in the past 24 hrs: none   Patient assessment note: Pt presents is calmer, speech is clearer, more coherent as compared to time of admission and the day after. Her attention to personal hygiene and grooming is continuing to improve, eye contact is good, speech is more coherent, more organized. Thought contents are more logical, and pt currently denies SI/HI/AVH. She denies delusional thinking and there is no evidence of such. She denies that she is pregnant, and is hyper focused on her relationship with her current BF.  Pt reports that her sleep quality last night was good, she reports a good appetite, denies being in any physical pain, denies medication related  side effects, denies current concerns. We are drawing a Lithium level tonight, and pt has been educated to contact her mother to talk to her about her BF, since her mother is concerned about BF's use of substances and residing at pt's apartment as per nursing reports. We are continuing medications as listed below. We are increasing Inderal to 10 mg TID since HR has slightly improved since starting it, but continues to be elevated. We are continuing other medications as listed below.   Total Time spent with patient: 45 minutes  Past Psychiatric History: See H & P  Past Medical History:  Past Medical History:  Diagnosis Date   Anxiety    Bipolar 1 disorder    hospitalized  3 x onset rx in 9th grade    Depression    Gestational diabetes    Gonorrhea    Hx of varicella    Syphilis    Twin pregnancy, delivered vaginally, IUFD stillborns 09/09/2015   IUFD  22 + weeks  still borns     Past Surgical History:  Procedure Laterality Date   DILATION AND EVACUATION N/A 09/09/2015   Procedure: DILATATION AND EVACUATION;  Surgeon: [name], MD;  Location: [facility];  Service: Gynecology;  Laterality: N/A;   TYMPANOSTOMY TUBE PLACEMENT     asage 2-3 yrs   Family History:  Family History  Problem Relation Age of Onset   Diabetes Mother    Arthritis Mother    Hypertension Father    Alcohol abuse Father    Alcoholism Father    Family Psychiatric  History: See H & P Social History:  Social History   Substance and  Sexual Activity  Alcohol Use Yes     Social History   Substance and Sexual Activity  Drug Use Yes   Types: Marijuana    Social History   Socioeconomic History   Marital status: Single    Spouse name: Not on file   Number of children: Not on file   Years of education: Not on file   Highest education level: Some college, no degree  Occupational History    Comment: Geophysicist/field seismologist on [employer]  Tobacco Use   Smoking status: Every Day    Types:  Cigars   Smokeless tobacco: Former   Tobacco comments:    1 cigarette a day  Vaping Use   Vaping Use: Never used  Substance and Sexual Activity   Alcohol use: Yes   Drug use: Yes    Types: Marijuana   Sexual activity: Yes    Birth control/protection: None  Other Topics Concern   Not on file  Social History Narrative   hh of 4    Lives at home  [place] [facility] [place]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  In past    Worked also [employer]   Pet dog   Neg tad some caffiene       Now living at home  Had been with vine   Social Determinants of Health   Financial Resource Strain: Not on file  Food Insecurity: No Food Insecurity (07/18/2022)   Hunger Vital Sign    Worried About Running Out of Food in the Last Year: Never true    Ran Out of Food in the Last Year: Never true  Transportation Needs: No Transportation Needs (07/18/2022)   PRAPARE - Administrator, Civil Service (Medical): No    Lack of Transportation (Non-Medical): No  Physical Activity: Not on file  Stress: Not on file  Social Connections: Not on file   Sleep: Good  Appetite:  Fair  Current Medications: Current Facility-Administered Medications  Medication Dose Route Frequency Provider Last Rate Last Admin   acetaminophen (TYLENOL) tablet 650 mg  650 mg Oral Q6H PRN Rankin, Shuvon B, NP       alum & mag hydroxide-simeth (MAALOX/MYLANTA) 200-200-20 MG/5ML suspension 30 mL  30 mL Oral Q4H PRN Rankin, Shuvon B, NP       alum & mag hydroxide-simeth (MAALOX/MYLANTA) 200-200-20 MG/5ML suspension 30 mL  30 mL Oral Q4H PRN Rankin, Shuvon B, NP       diphenhydrAMINE (BENADRYL) capsule 50 mg  50 mg Oral TID PRN Rankin, Shuvon B, NP       Or   diphenhydrAMINE (BENADRYL) injection 50 mg  50 mg Intramuscular TID PRN Rankin, Shuvon B, NP       haloperidol (HALDOL) tablet 5 mg  5 mg Oral Q6H PRN Aleigha Gilani, NP       Or   haloperidol lactate (HALDOL) injection 5 mg  5 mg Intramuscular Q6H PRN Zanasia Hickson, NP       hydrOXYzine  (ATARAX) tablet 25 mg  25 mg Oral TID PRN Rankin, Shuvon B, NP       lithium carbonate (ESKALITH) ER tablet 450 mg  450 mg Oral QHS Rockney Grenz, NP   450 mg at 07/21/22 2041   LORazepam (ATIVAN) tablet 1 mg  1 mg Oral TID PRN Rankin, Shuvon B, NP       Or   LORazepam (ATIVAN) injection 1 mg  1 mg Intramuscular TID PRN Rankin, Shuvon B, NP  magnesium hydroxide (MILK OF MAGNESIA) suspension 30 mL  30 mL Oral Daily PRN Rankin, Shuvon B, NP       magnesium hydroxide (MILK OF MAGNESIA) suspension 30 mL  30 mL Oral Daily PRN Rankin, Shuvon B, NP       propranolol (INDERAL) tablet 10 mg  10 mg Oral TID Starleen Blue, NP       risperiDONE (RISPERDAL) tablet 1 mg  1 mg Oral BID Myrta Mercer, NP   1 mg at 07/22/22 0844   traZODone (DESYREL) tablet 50 mg  50 mg Oral QHS PRN Starleen Blue, NP        Lab Results: No results found for this or any previous visit (from the past 48 hour(s)).  Blood Alcohol level:  Lab Results  Component Value Date   ETH <10 07/17/2022   ETH <10 07/17/2022    Metabolic Disorder Labs: Lab Results  Component Value Date   HGBA1C 5.4 07/17/2022   MPG 108.28 07/17/2022   MPG 111 02/22/2016   Lab Results  Component Value Date   PROLACTIN 92.6 (H) 02/22/2016   PROLACTIN  10/31/2009    35.4 (NOTE)     Reference Ranges:                 Female:                       2.1 -  17.1 ng/ml                 Female:   Pregnant          9.7 - 208.5 ng/mL                           Non Pregnant      2.8 -  29.2 ng/mL                           Post  Menopausal   1.8 -  20.3 ng/mL                     Lab Results  Component Value Date   CHOL 134 07/17/2022   TRIG 37 07/17/2022   HDL 70 07/17/2022   CHOLHDL 1.9 07/17/2022   VLDL 7 07/17/2022   LDLCALC 57 07/17/2022   LDLCALC 62 02/22/2016    Physical Findings: AIMS:  , ,  ,  ,    CIWA:    COWS:     Musculoskeletal: Strength & Muscle Tone: within normal limits Gait & Station: normal Patient leans:  N/A  Psychiatric Specialty Exam:  Presentation  General Appearance:  Appropriate for Environment; Fairly Groomed  Eye Contact: Good  Speech: Clear and Coherent  Speech Volume: Normal  Handedness: Right   Mood and Affect  Mood: Depressed  Affect: Congruent   Thought Process  Thought Processes: Coherent  Descriptions of Associations:Intact  Orientation:Full (Time, Place and Person)  Thought Content:Logical  History of Schizophrenia/Schizoaffective disorder:Yes  Duration of Psychotic Symptoms:Greater than six months  Hallucinations:Hallucinations: None  Ideas of Reference:None  Suicidal Thoughts:Suicidal Thoughts: No  Homicidal Thoughts:Homicidal Thoughts: No   Sensorium  Memory: Immediate Good  Judgment: Fair  Insight: Fair   Chartered certified accountant: Fair  Attention Span: Fair  Recall: Fair  Fund of Knowledge: Fair  Language: Fair   Psychomotor Activity  Psychomotor Activity: Psychomotor Activity: Normal   Assets  Assets:  Manufacturing systems engineer; Housing; Resilience; Social Support   Sleep  Sleep: Sleep: Good    Physical Exam: Physical Exam Constitutional:      Appearance: Normal appearance.  HENT:     Nose: Nose normal.  Eyes:     Pupils: Pupils are equal, round, and reactive to light.  Musculoskeletal:     Cervical back: Normal range of motion.  Neurological:     Mental Status: She is alert and oriented to person, place, and time.    Review of Systems  Constitutional: Negative.   HENT: Negative.    Eyes: Negative.   Respiratory: Negative.    Cardiovascular: Negative.   Gastrointestinal:  Negative for heartburn.  Genitourinary: Negative.   Musculoskeletal: Negative.   Skin: Negative.   Neurological:  Negative for dizziness.  Psychiatric/Behavioral:  Positive for depression and substance abuse. Negative for hallucinations, memory loss and suicidal ideas. The patient is nervous/anxious and  has insomnia.    Blood pressure 91/62, pulse (!) 113, temperature 98 F (36.7 C), temperature source Oral, resp. rate 14, height 5\' 7"  (1.702 m), weight 60.3 kg, last menstrual period 06/30/2022, SpO2 100 %, unknown if currently breastfeeding. Body mass index is 20.83 kg/m.  Treatment Plan Summary: Daily contact with patient to assess and evaluate symptoms and progress in treatment and Medication management   Observation Level/Precautions:  15 minute checks  Laboratory:  Labs reviewed   Psychotherapy:  Unit Group sessions  Medications:  See [facility]  Consultations:  To be determined   Discharge Concerns:  Safety, medication compliance, mood stability  Estimated LOS: 5-7 days  Other:  N/A    PLAN Safety and Monitoring: Voluntary admission to inpatient psychiatric unit for safety, stabilization and treatment Daily contact with patient to assess and evaluate symptoms and progress in treatment Patient's case to be discussed in multi-disciplinary team meeting Observation Level : q15 minute checks Vital signs: q12 hours Precautions: Safety   Long Term Goal(s): Improvement in symptoms so as ready for discharge   Short Term Goals: Ability to identify changes in lifestyle to reduce recurrence of condition will improve, Ability to disclose and discuss suicidal ideas, Ability to demonstrate self-control will improve, Ability to identify and develop effective coping behaviors will improve, Ability to maintain clinical measurements within normal limits will improve, Compliance with prescribed medications will improve, and Ability to identify triggers associated with substance abuse/mental health issues will improve   Diagnoses Principal Problem:   Bipolar affective disorder, currently manic, severe, with psychotic features Active Problems:   Delta-9-tetrahydrocannabinol (THC) dependence   Generalized anxiety disorder   Insomnia   Medications -Increase Inderal 10 mg TID for  tachycardia -Continue Risperdal 1 mg BID for mood stabilization -Continue Lithium to 450 mg nightly for mood stabilization (Level on 4/15 was 0.07, to be drawn again on 4/20) -Continue hydroxyzine 25 mg 3 times daily as needed for anxiety -Continue trazodone 50 mg as needed nightly for sleep -Continue agitation protocol medications: Benadryl 50 mg IM or p.o./Ativan 1 mg IM or p.o./Haldol 5 mg IM or p.o. 3 times daily as needed   Other PRNS -Continue Tylenol 650 mg every 6 hours PRN for mild pain -Continue Maalox 30 mg every 4 hrs PRN for indigestion -Continue Milk of Magnesia as needed every 6 hrs for constipation   Discharge Planning: Social work and case management to assist with discharge planning and identification of hospital follow-up needs prior to discharge Estimated LOS: 5-7 days Discharge Concerns: Need to establish a safety plan; Medication compliance and effectiveness  Discharge Goals: Return home with outpatient referrals for mental health follow-up including medication management/psychotherapy   I certify that inpatient services furnished can reasonably be expected to improve the patient's condition.    Starleen Blue, NP 07/22/2022, 2:45 PMPatient ID: Cheryl Cohen, female   DOB: [birth year], 31 y.o.   MRN: [id number]

## 2022-07-22 NOTE — Group Note (Signed)
Date:  07/22/2022 Time:  9:19 PM  Group Topic/Focus:  Wrap-Up Group:   The focus of this group is to help patients review their daily goal of treatment and discuss progress on daily workbooks.    Participation Level:  Active  Participation Quality:  Appropriate  Affect:  Appropriate  Cognitive:  Appropriate  Insight: Appropriate  Engagement in Group:  Engaged  Modes of Intervention:  Education and Exploration  Additional Comments:  Patient attended and participated in group tonight. She reports that she like that she is genuine and kind hearted She love herself and like to give people an encouraging word. She maintain a high level of respect and value herself as a mother  Scot Dock 07/22/2022, 9:19 PM

## 2022-07-23 DIAGNOSIS — F312 Bipolar disorder, current episode manic severe with psychotic features: Principal | ICD-10-CM

## 2022-07-23 MED ORDER — WHITE PETROLATUM EX OINT
TOPICAL_OINTMENT | CUTANEOUS | Status: AC
  Administered 2022-07-23: 1
  Filled 2022-07-23: qty 5

## 2022-07-23 NOTE — Progress Notes (Signed)
[facility] MD Progress Note  07/23/2022 8:36 AM Jazira Kalayah Leske  MRN:  [id number] Principal Problem: Bipolar affective disorder, currently manic, severe, with psychotic features Diagnosis: Principal Problem:   Bipolar affective disorder, currently manic, severe, with psychotic features Active Problems:   Delta-9-tetrahydrocannabinol (THC) dependence   Generalized anxiety disorder   Insomnia  Reason For Admission: Cheryl Cohen is a 32 yo with a history of bipolar d/o who was taken to the [facility] health urgent care by her mother on 4/15 with complaints of manic type symptoms & delusions that she is pregnant in the context of medication non compliance; pt was  observed to be exhibiting behaviors such as laughing inappropriately & making religious statements while at the [facility]. She was not agreeable to voluntary admission and involuntarily committed by her mother prior to being transported to this [facility] for treatment and stabilization of her mental status.   24 hr chart review: Staff report patient has remained visible in milieu attending unit groups and activities. She has been medication compliant with no PRN medications received. Staff report improved affect with no voiced delusions regarding being pregnant or her mother. Vital signs remain WNL; PR slightly elevated at 96. Slept throughout the night without any issues. Lithium level 07/22/22: 1.06 therapeutic.   Patient assessment note: Pt observed in dayroom during movie time interacting with peers and staff appropriately. She presents is calm and cooperative. Appropriate attention to ADLs. Alert and oriented to person, place, time and situation. She appears more logical in her thought content and processes; denies any thoughts of being pregnant or mother being after her. States 'I've been tested several times since I've been admitted so I know that's not true (pregnant) and my mom has been to see me and being supportive so I think it was  me not being on my medications. It really affects my mental health'. Reports ongoing contact with mother; boyfriend has been visiting regularly. Eye contact steady. Speech clear and coherent. Thoughts linear and logical with improved insight. Endorses good appetite and sleep. She is able to verbalize current medication regimen. Denies any SI/HI/AVH or side effects of medications. No evidence of delusional thinking or paranoia noted. Case discussed with attending MD Abbott Pao; treatment plan noted below.    Total Time spent with patient: 45 minutes  Past Psychiatric History: See H & P  Past Medical History:  Past Medical History:  Diagnosis Date   Anxiety    Bipolar 1 disorder    hospitalized  3 x onset rx in 9th grade    Depression    Gestational diabetes    Gonorrhea    Hx of varicella    Syphilis    Twin pregnancy, delivered vaginally, IUFD stillborns 09/09/2015   IUFD  22 + weeks  still borns     Past Surgical History:  Procedure Laterality Date   DILATION AND EVACUATION N/A 09/09/2015   Procedure: DILATATION AND EVACUATION;  Surgeon: [name], MD;  Location: [facility];  Service: Gynecology;  Laterality: N/A;   TYMPANOSTOMY TUBE PLACEMENT     asage 2-3 yrs   Family History:  Family History  Problem Relation Age of Onset   Diabetes Mother    Arthritis Mother    Hypertension Father    Alcohol abuse Father    Alcoholism Father    Family Psychiatric  History: See H & P Social History:  Social History   Substance and Sexual Activity  Alcohol Use Yes     Social History  Substance and Sexual Activity  Drug Use Yes   Types: Marijuana    Social History   Socioeconomic History   Marital status: Single    Spouse name: Not on file   Number of children: Not on file   Years of education: Not on file   Highest education level: Some college, no degree  Occupational History    Comment: Geophysicist/field seismologist on [employer]  Tobacco Use   Smoking status: Every  Day    Types: Cigars   Smokeless tobacco: Former   Tobacco comments:    1 cigarette a day  Vaping Use   Vaping Use: Never used  Substance and Sexual Activity   Alcohol use: Yes   Drug use: Yes    Types: Marijuana   Sexual activity: Yes    Birth control/protection: None  Other Topics Concern   Not on file  Social History Narrative   hh of 4    Lives at home  [place] 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  In past    Worked also [employer]   Pet dog   Neg tad some caffiene       Now living at home  Had been with vine   Social Determinants of Health   Financial Resource Strain: Not on file  Food Insecurity: No Food Insecurity (07/18/2022)   Hunger Vital Sign    Worried About Running Out of Food in the Last Year: Never true    Ran Out of Food in the Last Year: Never true  Transportation Needs: No Transportation Needs (07/18/2022)   PRAPARE - Administrator, Civil Service (Medical): No    Lack of Transportation (Non-Medical): No  Physical Activity: Not on file  Stress: Not on file  Social Connections: Not on file   Sleep: Good  Appetite:  Fair  Current Medications: Current Facility-Administered Medications  Medication Dose Route Frequency Provider Last Rate Last Admin   acetaminophen (TYLENOL) tablet 650 mg  650 mg Oral Q6H PRN Rankin, Shuvon B, NP       alum & mag hydroxide-simeth (MAALOX/MYLANTA) 200-200-20 MG/5ML suspension 30 mL  30 mL Oral Q4H PRN Rankin, Shuvon B, NP       alum & mag hydroxide-simeth (MAALOX/MYLANTA) 200-200-20 MG/5ML suspension 30 mL  30 mL Oral Q4H PRN Rankin, Shuvon B, NP       diphenhydrAMINE (BENADRYL) capsule 50 mg  50 mg Oral TID PRN Rankin, Shuvon B, NP       Or   diphenhydrAMINE (BENADRYL) injection 50 mg  50 mg Intramuscular TID PRN Rankin, Shuvon B, NP       haloperidol (HALDOL) tablet 5 mg  5 mg Oral Q6H PRN Nkwenti, Doris, NP       Or   haloperidol lactate (HALDOL) injection 5 mg  5 mg Intramuscular Q6H PRN Starleen Blue, NP        hydrOXYzine (ATARAX) tablet 25 mg  25 mg Oral TID PRN Rankin, Shuvon B, NP       lithium carbonate (ESKALITH) ER tablet 450 mg  450 mg Oral QHS Nkwenti, Doris, NP   450 mg at 07/22/22 2055   LORazepam (ATIVAN) tablet 1 mg  1 mg Oral TID PRN Rankin, Shuvon B, NP       Or   LORazepam (ATIVAN) injection 1 mg  1 mg Intramuscular TID PRN Rankin, Shuvon B, NP       magnesium hydroxide (MILK OF MAGNESIA) suspension 30 mL  30  mL Oral Daily PRN Rankin, Shuvon B, NP       magnesium hydroxide (MILK OF MAGNESIA) suspension 30 mL  30 mL Oral Daily PRN Rankin, Shuvon B, NP       propranolol (INDERAL) tablet 10 mg  10 mg Oral TID Starleen Blue, NP   10 mg at 07/23/22 0736   risperiDONE (RISPERDAL) tablet 1 mg  1 mg Oral BID Starleen Blue, NP   1 mg at 07/23/22 0736   traZODone (DESYREL) tablet 50 mg  50 mg Oral QHS PRN Starleen Blue, NP        Lab Results:  Results for orders placed or performed during the hospital encounter of 07/18/22 (from the past 48 hour(s))  Lithium level     Status: None   Collection Time: 07/22/22  6:49 PM  Result Value Ref Range   Lithium Lvl 1.06 0.60 - 1.20 mmol/L    Comment: Performed at [facility], [address] [place], [place] [address]    Blood Alcohol level:  Lab Results  Component Value Date   [facility] <10 07/17/2022   ETH <10 07/17/2022    Metabolic Disorder Labs: Lab Results  Component Value Date   HGBA1C 5.4 07/17/2022   MPG 108.28 07/17/2022   MPG 111 02/22/2016   Lab Results  Component Value Date   PROLACTIN 92.6 (H) 02/22/2016   PROLACTIN  10/31/2009    35.4 (NOTE)     Reference Ranges:                 Female:                       2.1 -  17.1 ng/ml                 Female:   Pregnant          9.7 - 208.5 ng/mL                           Non Pregnant      2.8 -  29.2 ng/mL                           Post  Menopausal   1.8 -  20.3 ng/mL                     Lab Results  Component Value Date   CHOL 134 07/17/2022   TRIG 37 07/17/2022   HDL 70  07/17/2022   CHOLHDL 1.9 07/17/2022   VLDL 7 07/17/2022   LDLCALC 57 07/17/2022   LDLCALC 62 02/22/2016    Physical Findings: AIMS:  , ,  ,  ,    CIWA:    COWS:     Musculoskeletal: Strength & Muscle Tone: within normal limits Gait & Station: normal Patient leans: N/A  Psychiatric Specialty Exam:  Presentation  General Appearance:  Appropriate for Environment; Fairly Groomed  Eye Contact: Good  Speech: Clear and Coherent  Speech Volume: Normal  Handedness: Right   Mood and Affect  Mood: Depressed  Affect: Congruent   Thought Process  Thought Processes: Coherent  Descriptions of Associations:Intact  Orientation:Full (Time, Place and Person)  Thought Content:Logical  History of Schizophrenia/Schizoaffective disorder:Yes  Duration of Psychotic Symptoms:Greater than six months  Hallucinations:Hallucinations: None  Ideas of Reference:None  Suicidal Thoughts:Suicidal Thoughts: No  Homicidal Thoughts:Homicidal Thoughts: No   Sensorium  Memory: Immediate Good  Judgment: Fair  Insight: Fair   Chartered certified accountant: Fair  Attention Span: Fair  Recall: Jennelle Human of Knowledge: Fair  Language: Fair   Psychomotor Activity  Psychomotor Activity: Psychomotor Activity: Normal   Assets  Assets: Manufacturing systems engineer; Housing; Resilience; Social Support   Sleep  Sleep: Sleep: Good    Physical Exam: Physical Exam Constitutional:      Appearance: Normal appearance.  HENT:     Nose: Nose normal.  Eyes:     Pupils: Pupils are equal, round, and reactive to light.  Musculoskeletal:     Cervical back: Normal range of motion.  Neurological:     Mental Status: She is alert and oriented to person, place, and time.  Psychiatric:        Attention and Perception: Attention and perception normal.        Mood and Affect: Mood and affect normal.        Speech: Speech normal.        Behavior: Behavior normal.  Behavior is cooperative.        Thought Content: Thought content normal. Thought content is not paranoid or delusional. Thought content does not include homicidal or suicidal ideation. Thought content does not include homicidal or suicidal plan.        Cognition and Memory: Cognition and memory normal.        Judgment: Judgment normal.    Review of Systems  Constitutional: Negative.   HENT: Negative.    Eyes: Negative.   Respiratory: Negative.    Cardiovascular: Negative.   Gastrointestinal:  Negative for heartburn.  Genitourinary: Negative.   Musculoskeletal: Negative.   Skin: Negative.   Neurological:  Negative for dizziness.  Psychiatric/Behavioral:  Positive for substance abuse. Negative for depression, hallucinations, memory loss and suicidal ideas. The patient is not nervous/anxious and does not have insomnia.    Blood pressure 100/74, pulse 96, temperature (!) 97.5 F (36.4 C), temperature source Oral, resp. rate 14, height 5\' 7"  (1.702 m), weight 60.3 kg, last menstrual period 06/30/2022, SpO2 100 %, unknown if currently breastfeeding. Body mass index is 20.83 kg/m.  Treatment Plan Summary: Daily contact with patient to assess and evaluate symptoms and progress in treatment and Medication management   Observation Level/Precautions:  15 minute checks  Laboratory:  Labs reviewed   Psychotherapy:  Unit Group sessions  Medications:  See [facility]  Consultations:  To be determined   Discharge Concerns:  Safety, medication compliance, mood stability  Estimated LOS: 5-7 days  Other:  N/A    PLAN Safety and Monitoring: Voluntary admission to inpatient psychiatric unit for safety, stabilization and treatment Daily contact with patient to assess and evaluate symptoms and progress in treatment Patient's case to be discussed in multi-disciplinary team meeting Observation Level : q15 minute checks Vital signs: q12 hours Precautions: Safety   Long Term Goal(s): Improvement in symptoms  so as ready for discharge   Short Term Goals: Ability to identify changes in lifestyle to reduce recurrence of condition will improve, Ability to disclose and discuss suicidal ideas, Ability to demonstrate self-control will improve, Ability to identify and develop effective coping behaviors will improve, Ability to maintain clinical measurements within normal limits will improve, Compliance with prescribed medications will improve, and Ability to identify triggers associated with substance abuse/mental health issues will improve   Diagnoses Principal Problem:   Bipolar affective disorder, currently manic, severe, with psychotic features Active Problems:   Delta-9-tetrahydrocannabinol (THC) dependence   Generalized anxiety  disorder   Insomnia   Medications: Continue: -Inderal 10 mg TID for tachycardia -Risperdal 1 mg BID for mood stabilization -Lithium to 450 mg nightly for mood stabilization (Level on 4/15 was 0.07, to be drawn again on 4/20)  - Agitation protocol medications: Benadryl 50 mg IM or p.o./Ativan 1 mg IM or p.o./Haldol 5 mg IM or p.o. 3 times daily as needed   Other PRNS  Tylenol 650 mg every 6 hours PRN for mild pain  Maalox 30 mg every 4 hrs PRN for indigestion  Milk of Magnesia as needed every 6 hrs for constipation Hydroxyzine 25 mg 3 times daily as needed for anxiety Trazodone 50 mg as needed nightly for sleep   Discharge Planning: Social work and case management to assist with discharge planning and identification of hospital follow-up needs prior to discharge Estimated LOS: 5-7 days Discharge Concerns: Need to establish a safety plan; Medication compliance and effectiveness Discharge Goals: Return home with outpatient referrals for mental health follow-up including medication management/psychotherapy   I certify that inpatient services furnished can reasonably be expected to improve the patient's condition.    Loletta Parish, NP 07/23/2022, 8:36  AM  Patient ID: Cheryl Cohen, female   DOB: 08-09-1990, 32 y.o.   MRN: [id number]

## 2022-07-23 NOTE — Progress Notes (Signed)

## 2022-07-23 NOTE — BHH Group Notes (Signed)
Adult Psychoeducational Group Note  Date:  07/23/2022 Time:  6:48 PM   Goals Group:   The focus of this group is to help patients establish daily goals to achieve during treatment and discuss how the patient can incorporate goal setting into their daily lives to aide in recovery.  Participation Level:  Active  Participation Quality:  Appropriate  Affect:  Appropriate  Cognitive:  Appropriate  Insight: Appropriate  Engagement in Group:  Engaged  Modes of Intervention:  Discussion  Additional Comments:  Pt attended the goals/orientation group and remained appropriate and engaged throughout the duration of the group.   Sheran Lawless 07/23/2022, 6:48 PM

## 2022-07-23 NOTE — B[facility] Group Notes (Signed)
Adult Psychoeducational Group Note  Date:  07/23/2022 Time:  8:53 PM  Group Topic/Focus:  Wrap-Up Group:   The focus of this group is to help patients review their daily goal of treatment and discuss progress on daily workbooks.  Participation Level:  Active  Participation Quality:  Appropriate  Affect:  Appropriate  Cognitive:  Appropriate  Insight: Appropriate  Engagement in Group:  Engaged  Modes of Intervention:  Discussion  Additional Comments:   Pt states that she had a good day and enjoyed celebrating one of her peers birthday throughout the day. Pt states she's been in a better mood, has been using coping skills to stay positive and focused, and is learning about the importance of medication management and is hopeful to remain on her medication when she leaves. Pt denies everything and uses dancing and singing as coping strategies  Cheryl Cohen 07/23/2022, 8:53 PM

## 2022-07-23 NOTE — Progress Notes (Signed)
   07/23/22 1100  Charting Type  Charting Type Shift assessment  Safety Check Verification  Has the RN verified the 15 minute safety check completion? Yes  Neurological  Neuro (WDL) WDL  HEENT  HEENT (WDL) WDL  Respiratory  Respiratory (WDL) WDL  Cardiac  Cardiac (WDL) WDL  Vascular  Vascular (WDL) WDL  Integumentary  Integumentary (WDL) WDL  Braden Scale (Ages 8 and up)  Sensory Perceptions 4  Moisture 4  Activity 4  Mobility 4  Nutrition 4  Friction and Shear 3  Braden Scale Score 23  Musculoskeletal  Musculoskeletal (WDL) WDL  Assistive Device None  Gastrointestinal  Gastrointestinal (WDL) WDL  GU Assessment  Genitourinary (WDL) WDL  Neurological  Level of Consciousness Alert   Patient Mother called to request an update with patient's condition and possible discharge date.

## 2022-07-23 NOTE — Progress Notes (Signed)
   07/23/22 2200  Psych Admission Type (Psych Patients Only)  Admission Status Involuntary  Psychosocial Assessment  Patient Complaints None  Eye Contact Fair  Facial Expression Animated  Affect Other (Comment)  Speech Logical/coherent  Interaction Assertive  Motor Activity Other (Comment)  Appearance/Hygiene Unremarkable  Behavior Characteristics Cooperative  Mood Pleasant  Aggressive Behavior  Effect No apparent injury  Thought Process  Coherency WDL  Content WDL  Delusions None reported or observed  Perception WDL  Hallucination None reported or observed  Judgment WDL  Confusion None  Danger to Self  Current suicidal ideation? Denies  Agreement Not to Harm Self Yes  Description of Agreement verbally contracts  Danger to Others  Danger to Others None reported or observed

## 2022-07-24 DIAGNOSIS — F312 Bipolar disorder, current episode manic severe with psychotic features: Secondary | ICD-10-CM | POA: Diagnosis not present

## 2022-07-24 MED ORDER — LITHIUM CARBONATE ER 450 MG PO TBCR: 450.0000 mg | EXTENDED_RELEASE_TABLET | Freq: Every day | ORAL | 0 refills | Status: DC

## 2022-07-24 MED ORDER — RISPERIDONE 1 MG PO TABS: 1.0000 mg | ORAL_TABLET | Freq: Two times a day (BID) | ORAL | 0 refills | Status: DC

## 2022-07-24 MED ORDER — PROPRANOLOL HCL 10 MG PO TABS: 10.0000 mg | ORAL_TABLET | Freq: Three times a day (TID) | ORAL | 0 refills | Status: DC

## 2022-07-24 NOTE — Progress Notes (Signed)
   07/24/22 0557  15 Minute Checks  Location Bedroom  Visual Appearance Calm  Behavior Composed  Sleep (Behavioral Health Patients Only)  Calculate sleep? (Click Yes once per 24 hr at 0600 safety check) Yes  Documented sleep last 24 hours 7.25

## 2022-07-24 NOTE — Group Note (Signed)
LCSW Group Therapy Note  Group Date: 07/24/2022 Start Time: 1300 End Time: 1400   Type of Therapy and Topic:  Group Therapy - How To Cope with Nervousness about Discharge   Participation Level:  Active   Description of Group This process group involved identification of patients' feelings about discharge. Some of them are scheduled to be discharged soon, while others are new admissions, but each of them was asked to share thoughts and feelings surrounding discharge from the hospital. One common theme was that they are excited at the prospect of going home, while another was that many of them are apprehensive about sharing why they were hospitalized. Patients were given the opportunity to discuss these feelings with their peers in preparation for discharge.  Therapeutic Goals  Patient will identify their overall feelings about pending discharge. Patient will think about how they might proactively address issues that they believe will once again arise once they get home (i.e. with parents). Patients will participate in discussion about having hope for change.   Summary of Patient Progress:  Cheryl Cohen was very active throughout the session. She demonstrated good insight into the subject matter, and proved open to input from peers and feedback from CSW. She was respectful of peers and participated throughout the entire session.   Therapeutic Modalities Cognitive Behavioral Therapy   Beatris Si, LCSW 07/24/2022  2:25 PM

## 2022-07-24 NOTE — Plan of Care (Signed)
Patient engaged in pro-social interactions with peers and staff during each recreation therapy group sessions.   Irby Fails-McCall, LRT,CTRS

## 2022-07-24 NOTE — Group Note (Signed)
Recreation Therapy Group Note   Group Topic:Coping Skills  Group Date: 07/24/2022 Start Time: 1022 End Time: 1105 Facilitators: Caitlin Hillmer-McCall, LRT,CTRS Location: [address] Dayroom   Goal Area(s) Addresses: Patient will define what a coping skill is. Patient will work to create a list of healthy coping skills beginning with each letter of the alphabet. Patient will successfully identify positive coping skills they can use post d/c.  Patient will acknowledge benefit(s) of using learned coping skills post d/c.  Group Description: Coping A to Z. Patient asked to identify what a coping skill is and when they use them. Patients with Clinical research associate discussed healthy versus unhealthy coping skills. Next patients were given a blank worksheet titled "Coping Skills A-Z" . Patients were instructed to come up with at least one positive coping skill per letter of the alphabet, addressing a specific challenge (ex: substance abuse, anger, anxiety, isolation, family, finances) patients were given 15 minutes to brainstorm, before ideas were presented to the large group. Patients and LRT debriefed on the importance of coping skill selection based on situation and back-up plans when a skill tried is not effective. At the end of group, patients were given an handout of alphabetized strategies to keep for future reference.   Affect/Mood: Appropriate   Participation Level: Engaged   Participation Quality: Independent   Behavior: Appropriate   Speech/Thought Process: Focused   Insight: Good   Judgement: Good   Modes of Intervention: Worksheet   Patient Response to Interventions:  Engaged   Education Outcome:  In group clarification offered    Clinical Observations/Individualized Feedback: Pt came late to group but was able to catch with the activity.  Pt was bright and into the activity.  Pt had to come up with coping skills for isolation.  Pt came up with the coping skills of aromatherapy,  captivating images, drawing, God in heart/mind, making a positive/memorable collage, loving self, praying, overcome battles, trust everything will be alright, not neglect self, eliminate bad thoughts and have happy thoughts.    Plan: Continue to engage patient in RT group sessions 2-3x/week.   Cheryl Cohen, LRT,CTRS 07/24/2022 12:05 PM

## 2022-07-24 NOTE — Discharge Summary (Signed)
Physician Discharge Summary Note  Patient:  Cheryl Cohen is an 32 y.o., female MRN:  [id number] DOB:  1990-06-27 Patient phone:  [phone] (home)  Patient address:   [address] [facility] [address],  Total Time spent with patient: 45 minutes  Date of Admission:  07/18/2022 Date of Discharge: 07/24/2022  Reason for Admission: Keah B. Roadcap is a 79 yo with a history of bipolar d/o who was taken to the [facility] health urgent care by her mother on 4/15 with complaints of manic type symptoms & delusions that she is pregnant in the context of medication non compliance; pt was observed to be exhibiting behaviors such as laughing inappropriately & making religious statements while at the [facility]. She was not agreeable to voluntary admission and involuntarily committed by her mother prior to being transported to this [facility] for treatment and stabilization of her mental status.   Principal Problem: Bipolar affective disorder, currently manic, severe, with psychotic features Discharge Diagnoses: Principal Problem:   Bipolar affective disorder, currently manic, severe, with psychotic features Active Problems:   Delta-9-tetrahydrocannabinol (THC) dependence   Generalized anxiety disorder   Insomnia  Past Psychiatric History: See H &P  Past Medical History:  Past Medical History:  Diagnosis Date   Anxiety    Bipolar 1 disorder    hospitalized  3 x onset rx in 9th grade    Depression    Gestational diabetes    Gonorrhea    Hx of varicella    Syphilis    Twin pregnancy, delivered vaginally, IUFD stillborns 09/09/2015   IUFD  22 + weeks  still borns     Past Surgical History:  Procedure Laterality Date   DILATION AND EVACUATION N/A 09/09/2015   Procedure: DILATATION AND EVACUATION;  Surgeon: [name], MD;  Location: [facility];  Service: Gynecology;  Laterality: N/A;   TYMPANOSTOMY TUBE PLACEMENT     asage 2-3 yrs   Family History:  Family History   Problem Relation Age of Onset   Diabetes Mother    Arthritis Mother    Hypertension Father    Alcohol abuse Father    Alcoholism Father    Family Psychiatric  History: See H &P Social History:  Social History   Substance and Sexual Activity  Alcohol Use Yes     Social History   Substance and Sexual Activity  Drug Use Yes   Types: Marijuana    Social History   Socioeconomic History   Marital status: Single    Spouse name: Not on file   Number of children: Not on file   Years of education: Not on file   Highest education level: Some college, no degree  Occupational History    Comment: Geophysicist/field seismologist on [employer]  Tobacco Use   Smoking status: Every Day    Types: Cigars   Smokeless tobacco: Former   Tobacco comments:    1 cigarette a day  Vaping Use   Vaping Use: Never used  Substance and Sexual Activity   Alcohol use: Yes   Drug use: Yes    Types: Marijuana   Sexual activity: Yes    Birth control/protection: None  Other Topics Concern   Not on file  Social History Narrative   hh of 4    Lives at home  [place] 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  In past    Worked also [employer]   Pet dog   Neg tad some caffiene  Now living at home  Had been with vine   Social Determinants of Health   Financial Resource Strain: Not on file  Food Insecurity: No Food Insecurity (07/18/2022)   Hunger Vital Sign    Worried About Running Out of Food in the Last Year: Never true    Ran Out of Food in the Last Year: Never true  Transportation Needs: No Transportation Needs (07/18/2022)   PRAPARE - Administrator, Civil Service (Medical): No    Lack of Transportation (Non-Medical): No  Physical Activity: Not on file  Stress: Not on file  Social Connections: Not on file   Hospital Course:   During the patient's hospitalization, patient had extensive initial psychiatric evaluation, and follow-up psychiatric evaluations every day. Psychiatric  diagnoses provided upon initial assessment are as listed above. Patient's psychiatric medications were adjusted on admission as follows: -Start Risperdal 1 mg BID for mood stabilization -Increase Lithium to 450 mg nightly for mood stabilization (Level on 4/15 was 0.07, to be drawn again on 4/21) -Continue hydroxyzine 25 mg 3 times daily as needed for anxiety -Continue agitation protocol medications: Benadryl 50 mg IM or p.o./Ativan 1 mg IM or p.o./Haldol 5 mg IM or p.o. 3 times daily as needed   During the hospitalization, other adjustments were made to the patient's psychiatric medication regimen. Medications at discharge are as follows:   -Continue Inderal 10 mg TID for tachycardia/GAD -Continue Risperdal 1 mg BID for mood stabilization -Continue Lithium to 450 mg nightly for mood stabilization (Level on 4/21 was therapeutic at 1.06)  Patient educated on the need to let her outpatient provider know in advance if she plans to get pregnant due to the fact that Lithium can cause deformities in the fetus.    ANTIPSYCHOTIC CONSENT : We discussed the risks, benefits, side effects, and alternatives to THE PRESCRIBED ANTIPSYCHOTIC, including but not limited to, the risk of fatigue, sedation, metabolic syndrome, weight gain, movement abnormalities such as tremor & cogwheeling & tardive dyskinesia, temperature sensitivity, photosensitivity, blood pressure changes, heart rhythm effects, potential for medication interactions, and to not take these medications with alcohol or illicit drugs; informed consent was obtained. We discussed the necessity for routine monitoring including rating scales of abnormal movements, blood work, and ekgs, while the patient is prescribed antipsychotic medication.    Patient's care was discussed during the interdisciplinary team meeting every day during the hospitalization. The patient  having side effects to prescribed psychiatric medication. Gradually, patient started adjusting  to milieu. The patient was evaluated each day by a clinical provider to ascertain response to treatment. Improvement was noted by the patient's report of decreasing symptoms, improved sleep and appetite, affect, medication tolerance, behavior, and participation in unit programming.  Patient was asked each day to complete a self inventory noting mood, mental status, pain, new symptoms, anxiety and concerns. Symptoms were reported as significantly decreased or resolved completely by discharge. On day of discharge, the patient reports that their mood is stable. The patient denied having suicidal thoughts for more than 48 hours prior to discharge.  Patient denies having homicidal thoughts.  Patient denies having auditory hallucinations.  Patient denies any visual hallucinations or other symptoms of psychosis. The patient was motivated to continue taking medication with a goal of continued improvement in mental health.    The patient reports their target psychiatric symptoms of anxiety, mood lability, insomnia, anxiety responded well to the psychiatric medications, and the patient reports overall benefit other psychiatric hospitalization. Supportive psychotherapy was provided  to the patient. The patient also participated in regular group therapy while hospitalized. Coping skills, problem solving as well as relaxation therapies were also part of the unit programming.   Labs were reviewed with the patient, and abnormal results were discussed with the patient.   The patient is able to verbalize their individual safety plan to this provider.   # It is recommended to the patient to continue psychiatric medications as prescribed, after discharge from the hospital.     # It is recommended to the patient to follow up with your outpatient psychiatric provider and PCP.   # It was discussed with the patient, the impact of alcohol, drugs, tobacco have been there overall psychiatric and medical wellbeing, and total  abstinence from substance use was recommended the patient.ed.   # Prescriptions provided or sent directly to preferred pharmacy at discharge. Patient agreeable to plan. Given opportunity to ask questions. Appears to feel comfortable with discharge.    # In the event of worsening symptoms, the patient is instructed to call the crisis hotline (988), 911 and or go to the nearest ED for appropriate evaluation and treatment of symptoms. To follow-up with primary care provider for other medical issues, concerns and or health care needs   # Patient was discharged home with a plan to follow up as noted below.    Physical Findings: AIMS: 0 CIWA: 0 COWS: 0  Musculoskeletal: Strength & Muscle Tone: within normal limits Gait & Station: normal Patient leans: N/A  Psychiatric Specialty Exam:  Presentation  General Appearance:  Appropriate for Environment; Fairly Groomed  Eye Contact: Fair  Speech: Clear and Coherent  Speech Volume: Normal  Handedness: Right   Mood and Affect  Mood: Euthymic  Affect: Appropriate; Congruent   Thought Process  Thought Processes: Coherent  Descriptions of Associations:Intact  Orientation:Full (Time, Place and Person)  Thought Content:Logical  History of Schizophrenia/Schizoaffective disorder:Yes  Duration of Psychotic Symptoms:Greater than six months  Hallucinations:Hallucinations: None  Ideas of Reference:None  Suicidal Thoughts:Suicidal Thoughts: No  Homicidal Thoughts:Homicidal Thoughts: No   Sensorium  Memory: Immediate Good  Judgment: Good  Insight: Good   Executive Functions  Concentration: Good  Attention Span: Good  Recall: Good  Fund of Knowledge: Good  Language: Good   Psychomotor Activity  Psychomotor Activity: Psychomotor Activity: Normal   Assets  Assets: Communication Skills   Sleep  Sleep: Sleep: Good    Physical Exam: Physical Exam Constitutional:      Appearance: Normal  appearance.  HENT:     Head: Normocephalic.     Nose: No congestion.  Eyes:     Pupils: Pupils are equal, round, and reactive to light.  Musculoskeletal:        General: Normal range of motion.     Cervical back: Normal range of motion.  Neurological:     Mental Status: She is alert and oriented to person, place, and time.  Psychiatric:        Thought Content: Thought content normal.    Review of Systems  Constitutional:  Negative for fever.  HENT:  Negative for hearing loss.   Eyes:  Negative for blurred vision.  Respiratory:  Negative for cough.   Cardiovascular:  Negative for chest pain.  Gastrointestinal:  Negative for heartburn.  Genitourinary:  Negative for dysuria.  Musculoskeletal:  Negative for myalgias.  Neurological:  Negative for dizziness.  Psychiatric/Behavioral:  Positive for substance abuse (educated on the negative impacts of substance abuse on mental health and on the need  to cease using). Negative for hallucinations and memory loss. The patient is nervous/anxious (resolving) and has insomnia (resolving).    Blood pressure 98/69, pulse 91, temperature 98.3 F (36.8 C), temperature source Oral, resp. rate 14, height 5\' 7"  (1.702 m), weight 60.3 kg, last menstrual period 06/30/2022, SpO2 100 %, unknown if currently breastfeeding. Body mass index is 20.83 kg/m.   Social History   Tobacco Use  Smoking Status Every Day   Types: Cigars  Smokeless Tobacco Former  Tobacco Comments   1 cigarette a day   Tobacco Cessation:  N/A, patient does not currently use tobacco products   Blood Alcohol level:  Lab Results  Component Value Date   ETH <10 07/17/2022   ETH <10 07/17/2022    Metabolic Disorder Labs:  Lab Results  Component Value Date   HGBA1C 5.4 07/17/2022   MPG 108.28 07/17/2022   MPG 111 02/22/2016   Lab Results  Component Value Date   PROLACTIN 92.6 (H) 02/22/2016   PROLACTIN  10/31/2009    35.4 (NOTE)     Reference Ranges:                  Female:                       2.1 -  17.1 ng/ml                 Female:   Pregnant          9.7 - 208.5 ng/mL                           Non Pregnant      2.8 -  29.2 ng/mL                           Post  Menopausal   1.8 -  20.3 ng/mL                     Lab Results  Component Value Date   CHOL 134 07/17/2022   TRIG 37 07/17/2022   HDL 70 07/17/2022   CHOLHDL 1.9 07/17/2022   VLDL 7 07/17/2022   LDLCALC 57 07/17/2022   LDLCALC 62 02/22/2016    See Psychiatric Specialty Exam and Suicide Risk Assessment completed by Attending Physician prior to discharge.  Discharge destination:  Home  Is patient on multiple antipsychotic therapies at discharge:  No   Has Patient had three or more failed trials of antipsychotic monotherapy by history:  No  Recommended Plan for Multiple Antipsychotic Therapies: NA   Allergies as of 07/24/2022       Reactions   Amoxicillin Rash, Other (See Comments)   Has patient had a PCN reaction causing immediate rash, facial/tongue/throat swelling, SOB or lightheadedness with hypotension: yes Has patient had a PCN reaction causing severe rash involving mucus membranes or skin necrosis: No Has patient had a PCN reaction that required hospitalization: No Has patient had a PCN reaction occurring within the last 10 years: Yes If all of the above answers are "NO", then may proceed with Cephalosporin use.        Medication List     STOP taking these medications    HAIR SKIN & NAILS GUMMIES PO   OVER THE COUNTER MEDICATION   VITAMIN C GUMMIES PO       TAKE these medications  Indication  lithium carbonate 450 MG ER tablet Commonly known as: ESKALITH Take 1 tablet (450 mg total) by mouth at bedtime. What changed:  medication strength how much to take  Indication: Manic-Depression   propranolol 10 MG tablet Commonly known as: INDERAL Take 1 tablet (10 mg total) by mouth 3 (three) times daily.  Indication: tachycardia   risperiDONE 1 MG  tablet Commonly known as: RISPERDAL Take 1 tablet (1 mg total) by mouth 2 (two) times daily.  Indication: mood stabilization/psychosis        Follow-up Information     [facility] Go to.   Why: You have an in person follow up med managment appt on 07/26/2022 at 1pm.  If you need to change this appt to virtual or for another day please call to reschedule.  Dr. Mervyn Skeeters can do a referral to therapy if you are interested in therapy services.   Please discuss referral with doctor. Contact information: [address] [place] [address] [phone]                Signed: Starleen Blue, NP 07/24/2022, 12:56 PM

## 2022-07-24 NOTE — Progress Notes (Signed)
Pt discharged to lobby. Pt was stable and appreciative at that time. All papers and prescriptions were given and valuables returned. Verbal understanding expressed. Denies SI/HI and A/VH. Pt given opportunity to express concerns and ask questions.  

## 2022-07-24 NOTE — [facility] Suicide Risk Assessment (Signed)
Suicide Risk Assessment  Discharge Assessment    [facility] Discharge Suicide Risk Assessment   Principal Problem: Bipolar affective disorder, currently manic, severe, with psychotic features Discharge Diagnoses: Principal Problem:   Bipolar affective disorder, currently manic, severe, with psychotic features Active Problems:   Delta-9-tetrahydrocannabinol (THC) dependence   Generalized anxiety disorder   Insomnia  Reason For Admission: Cheryl Cohen is a 32 yo with a history of bipolar d/o who was taken to the [facility] health urgent care by her mother on 4/15 with complaints of manic type symptoms & delusions that she is pregnant in the context of medication non compliance; pt was  observed to be exhibiting behaviors such as laughing inappropriately & making religious statements while at the [facility]. She was not agreeable to voluntary admission and involuntarily committed by her mother prior to being transported to this [facility] for treatment and stabilization of her mental status.   Hospital Course: During the patient's hospitalization, patient had extensive initial psychiatric evaluation, and follow-up psychiatric evaluations every day. Psychiatric diagnoses provided upon initial assessment are as listed above. Patient's psychiatric medications were adjusted on admission as follows: -Start Risperdal 1 mg BID for mood stabilization -Increase Lithium to 450 mg nightly for mood stabilization (Level on 4/15 was 0.07, to be drawn again on 4/21) -Continue hydroxyzine 25 mg 3 times daily as needed for anxiety -Continue agitation protocol medications: Benadryl 50 mg IM or p.o./Ativan 1 mg IM or p.o./Haldol 5 mg IM or p.o. 3 times daily as needed  During the hospitalization, other adjustments were made to the patient's psychiatric medication regimen. Medications at discharge are as follows:  -Continue Inderal 10 mg TID for tachycardia/GAD -Continue Risperdal 1 mg BID for mood  stabilization -Continue Lithium to 450 mg nightly for mood stabilization (Level on 4/21 was therapeutic at 1.06)  Patient educated on the need to let her outpatient provider know in advance if she plans to get pregnant due to the fact that Lithium can cause deformities in the fetus.   ANTIPSYCHOTIC CONSENT : We discussed the risks, benefits, side effects, and alternatives to THE PRESCRIBED ANTIPSYCHOTIC, including but not limited to, the risk of fatigue, sedation, metabolic syndrome, weight gain, movement abnormalities such as tremor & cogwheeling & tardive dyskinesia, temperature sensitivity, photosensitivity, blood pressure changes, heart rhythm effects, potential for medication interactions, and to not take these medications with alcohol or illicit drugs; informed consent was obtained. We discussed the necessity for routine monitoring including rating scales of abnormal movements, blood work, and ekgs, while the patient is prescribed antipsychotic medication.   Patient's care was discussed during the interdisciplinary team meeting every day during the hospitalization. The patient  having side effects to prescribed psychiatric medication. Gradually, patient started adjusting to milieu. The patient was evaluated each day by a clinical provider to ascertain response to treatment. Improvement was noted by the patient's report of decreasing symptoms, improved sleep and appetite, affect, medication tolerance, behavior, and participation in unit programming.  Patient was asked each day to complete a self inventory noting mood, mental status, pain, new symptoms, anxiety and concerns. Symptoms were reported as significantly decreased or resolved completely by discharge. On day of discharge, the patient reports that their mood is stable. The patient denied having suicidal thoughts for more than 48 hours prior to discharge.  Patient denies having homicidal thoughts.  Patient denies having auditory hallucinations.   Patient denies any visual hallucinations or other symptoms of psychosis. The patient was motivated to continue taking medication with  a goal of continued improvement in mental health.   The patient reports their target psychiatric symptoms of anxiety, mood lability, insomnia, anxiety responded well to the psychiatric medications, and the patient reports overall benefit other psychiatric hospitalization. Supportive psychotherapy was provided to the patient. The patient also participated in regular group therapy while hospitalized. Coping skills, problem solving as well as relaxation therapies were also part of the unit programming.  Labs were reviewed with the patient, and abnormal results were discussed with the patient.  The patient is able to verbalize their individual safety plan to this provider.  # It is recommended to the patient to continue psychiatric medications as prescribed, after discharge from the hospital.    # It is recommended to the patient to follow up with your outpatient psychiatric provider and PCP.  # It was discussed with the patient, the impact of alcohol, drugs, tobacco have been there overall psychiatric and medical wellbeing, and total abstinence from substance use was recommended the patient.ed.  # Prescriptions provided or sent directly to preferred pharmacy at discharge. Patient agreeable to plan. Given opportunity to ask questions. Appears to feel comfortable with discharge.    # In the event of worsening symptoms, the patient is instructed to call the crisis hotline (988), 911 and or go to the nearest ED for appropriate evaluation and treatment of symptoms. To follow-up with primary care provider for other medical issues, concerns and or health care needs  # Patient was discharged home with a plan to follow up as noted below.   Total Time spent with patient: 45 minutes  Musculoskeletal: Strength & Muscle Tone: within normal limits Gait & Station:  normal Patient leans: N/A  Psychiatric Specialty Exam  Presentation  General Appearance:  Appropriate for Environment; Fairly Groomed  Eye Contact: Fair  Speech: Clear and Coherent  Speech Volume: Normal  Handedness: Right   Mood and Affect  Mood: Euthymic  Duration of Depression Symptoms: No data recorded Affect: Appropriate; Congruent   Thought Process  Thought Processes: Coherent  Descriptions of Associations:Intact  Orientation:Full (Time, Place and Person)  Thought Content:Logical  History of Schizophrenia/Schizoaffective disorder:Yes  Duration of Psychotic Symptoms:Greater than six months  Hallucinations:Hallucinations: None  Ideas of Reference:None  Suicidal Thoughts:Suicidal Thoughts: No  Homicidal Thoughts:Homicidal Thoughts: No   Sensorium  Memory: Immediate Good  Judgment: Good  Insight: Good   Executive Functions  Concentration: Good  Attention Span: Good  Recall: Good  Fund of Knowledge: Good  Language: Good   Psychomotor Activity  Psychomotor Activity: Psychomotor Activity: Normal  Assets  Assets: Communication Skills  Sleep  Sleep: Sleep: Good  Physical Exam: Physical Exam Constitutional:      Appearance: Normal appearance.  HENT:     Head: Normocephalic.     Nose: Nose normal. No congestion.  Eyes:     Pupils: Pupils are equal, round, and reactive to light.  Musculoskeletal:        General: Normal range of motion.     Cervical back: Normal range of motion.  Neurological:     General: No focal deficit present.     Mental Status: She is alert and oriented to person, place, and time.     Sensory: No sensory deficit.    Review of Systems  Constitutional:  Negative for fever.  Eyes:  Negative for blurred vision.  Respiratory:  Negative for cough.   Cardiovascular:  Negative for chest pain.  Gastrointestinal:  Negative for heartburn.  Genitourinary:  Negative for dysuria.  Musculoskeletal:  Negative for myalgias.  Skin:  Negative for rash.  Neurological:  Negative for dizziness.  Psychiatric/Behavioral:  Positive for depression (Denies SI/HI/AVH, verbally contracts for safety outside of [facility]) and substance abuse. Negative for hallucinations, memory loss and suicidal ideas. The patient is nervous/anxious (resolving) and has insomnia (resolving).    Blood pressure 98/69, pulse 91, temperature 98.3 F (36.8 C), temperature source Oral, resp. rate 14, height  (1.702 m), weight 60.3 kg, last menstrual period 06/30/2022, SpO2 100 %, unknown if currently breastfeeding. Body mass index is 20.83 kg/m.  Mental Status Per Nursing Assessment::   On Admission:  NA  Demographic Factors:  Low socioeconomic status and Living alone  Loss Factors: NA  Historical Factors: Impulsivity  Risk Reduction Factors:   Positive social support  Continued Clinical Symptoms:  Bipolar Disorder:   Mixed State-Patient was in a manic phase of her bipolar illness prior to this hospitalization and had some psychosis which has now resolved. She is currently at her baseline of functioning and denies SI/HI/AVH, denies paranoia, and there is no evidence if delusional thinking. She is verbally contracting for safety outside of this [facility].  Cognitive Features That Contribute To Risk:  None    Suicide Risk:  Mild:  There are no identifiable suicide plans, no associated intent, mild dysphoria and related symptoms, good self-control (both objective and subjective assessment), few other risk factors, and identifiable protective factors, including available and accessible social support.    Follow-up Information     [facility] Go to.   Why: You have an in person follow up med managment appt on 07/26/2022 at 1pm.  If you need to change this appt to virtual or for another day please call to reschedule.  Dr. Mervyn Skeeters can do a referral to therapy if you are interested in therapy services.   Please  discuss referral with doctor. Contact information: [address] [place] [address] [phone]                Starleen Blue, NP 07/24/2022, 9:56 AM

## 2022-07-24 NOTE — Progress Notes (Signed)
  [facility] Adult Case Management Discharge Plan :  Will you be returning to the same living situation after discharge:  Yes,  staying with family At discharge, do you have transportation home?: Yes,  Mother will pick patient up Do you have the ability to pay for your medications: Yes,  insurance   Release of information consent forms completed and in the chart;  Patient's signature needed at discharge.  Patient to Follow up at:  Follow-up Information     [facility] Go to.   Why: You have an in person follow up med managment appt on 07/26/2022 at 1pm.  If you need to change this appt to virtual or for another day please call to reschedule.  Dr. Mervyn Skeeters can do a referral to therapy if you are interested in therapy services.   Please discuss referral with doctor. Contact information: [address] [place] [address] [phone]                 Next level of care provider has access to [facility] Link:no  Safety Planning and Suicide Prevention discussed: Yes,   Demorris Nelda Marseille, [phone]     Has patient been referred to the Quitline?: Patient refused referral  Patient has been referred for addiction treatment: N/A  Maxden Naji E Nicholas Ossa, LCSW 07/24/2022, 9:53 AM

## 2022-07-24 NOTE — Progress Notes (Signed)
Recreation Therapy Notes  INPATIENT RECREATION TR PLAN  Patient Details Name: Cheryl Cohen MRN: [id number] DOB: 01-11-1991 Today's Date: 07/24/2022  Rec Therapy Plan Is patient appropriate for Therapeutic Recreation?: Yes Treatment times per week: about 3 days Estimated Length of Stay: 5-7 days TR Treatment/Interventions: Group participation (Comment)  Discharge Criteria Pt will be discharged from therapy if:: Discharged Treatment plan/goals/alternatives discussed and agreed upon by:: Patient/family  Discharge Summary Short term goals set: See patient care plan Short term goals met: Complete Progress toward goals comments: Groups attended Which groups?: Coping skills, Leisure education, Other (Comment) (Problem solving; Personal Development) Reason goals not met: None Therapeutic equipment acquired: N/A Reason patient discharged from therapy: Discharge from hospital Pt/family agrees with progress & goals achieved: Yes Date patient discharged from therapy: 07/24/22   Jerik Falletta-McCall, LRT,CTRS Karenann Mcgrory A Gregorio Worley-McCall 07/24/2022, 12:35 PM

## 2022-07-26 DIAGNOSIS — F310 Bipolar disorder, current episode hypomanic: Secondary | ICD-10-CM | POA: Diagnosis not present

## 2022-07-26 DIAGNOSIS — F909 Attention-deficit hyperactivity disorder, unspecified type: Secondary | ICD-10-CM | POA: Diagnosis not present

## 2022-07-28 ENCOUNTER — Ambulatory Visit (HOSPITAL_COMMUNITY)
Admission: EM | Admit: 2022-07-28 | Discharge: 2022-07-30 | Disposition: A | Discharge status: Other Acute Inpatient Hospital | Payer: BC Managed Care – PPO | Attending: Family | Admitting: Family

## 2022-07-28 ENCOUNTER — Other Ambulatory Visit: Payer: Self-pay

## 2022-07-28 DIAGNOSIS — Z91148 Patient's other noncompliance with medication regimen for other reason: Secondary | ICD-10-CM | POA: Insufficient documentation

## 2022-07-28 DIAGNOSIS — F411 Generalized anxiety disorder: Secondary | ICD-10-CM | POA: Insufficient documentation

## 2022-07-28 DIAGNOSIS — F312 Bipolar disorder, current episode manic severe with psychotic features: Secondary | ICD-10-CM | POA: Insufficient documentation

## 2022-07-28 DIAGNOSIS — R9431 Abnormal electrocardiogram [ECG] [EKG]: Secondary | ICD-10-CM | POA: Diagnosis not present

## 2022-07-28 LAB — COMPREHENSIVE METABOLIC PANEL
ALT: 41 U/L (ref 0–44)
AST: 26 U/L (ref 15–41)
Albumin: 3.7 g/dL (ref 3.5–5.0)
Alkaline Phosphatase: 51 U/L (ref 38–126)
Anion gap: 9 (ref 5–15)
BUN: 6 mg/dL (ref 6–20)
CO2: 25 mmol/L (ref 22–32)
Calcium: 8.7 mg/dL — ABNORMAL LOW (ref 8.9–10.3)
Chloride: 102 mmol/L (ref 98–111)
Creatinine, Ser: 0.75 mg/dL (ref 0.44–1.00)
GFR, Estimated: >60 mL/min (ref >60)
Glucose, Bld: 83 mg/dL (ref 70–99)
Potassium: 3.4 mmol/L — ABNORMAL LOW (ref 3.5–5.1)
Sodium: 136 mmol/L (ref 135–145)
Total Bilirubin: 0.9 mg/dL (ref 0.3–1.2)
Total Protein: 6.8 g/dL (ref 6.5–8.1)

## 2022-07-28 LAB — CBC WITH DIFFERENTIAL/PLATELET
Abs Immature Granulocytes: 0.01 10*3/uL (ref 0.00–0.07)
Basophils Absolute: 0 10*3/uL (ref 0.0–0.1)
Basophils Relative: 1 %
Eosinophils Absolute: 0.2 10*3/uL (ref 0.0–0.5)
Eosinophils Relative: 3 %
HCT: 40.2 % (ref 36.0–46.0)
Hemoglobin: 13.7 g/dL (ref 12.0–15.0)
Immature Granulocytes: 0 %
Lymphocytes Relative: 39 %
Lymphs Abs: 2.3 10*3/uL (ref 0.7–4.0)
MCH: 31.4 pg (ref 26.0–34.0)
MCHC: 34.1 g/dL (ref 30.0–36.0)
MCV: 92.2 fL (ref 80.0–100.0)
Monocytes Absolute: 0.9 10*3/uL (ref 0.1–1.0)
Monocytes Relative: 16 %
Neutro Abs: 2.5 10*3/uL (ref 1.7–7.7)
Neutrophils Relative %: 41 %
Platelets: 265 10*3/uL (ref 150–400)
RBC: 4.36 MIL/uL (ref 3.87–5.11)
RDW: 12.4 % (ref 11.5–15.5)
WBC: 5.9 10*3/uL (ref 4.0–10.5)
nRBC: 0 % (ref 0.0–0.2)

## 2022-07-28 LAB — MAGNESIUM: Magnesium: 2.3 mg/dL (ref 1.7–2.4)

## 2022-07-28 LAB — LITHIUM LEVEL: Lithium Lvl: <0.06 mmol/L — ABNORMAL LOW (ref 0.60–1.20)

## 2022-07-28 LAB — ETHANOL: Alcohol, Ethyl (B): <10 mg/dL (ref <10)

## 2022-07-28 MED ORDER — HALOPERIDOL 5 MG PO TABS
5.0000 mg | ORAL_TABLET | Freq: Three times a day (TID) | ORAL | Status: DC | PRN
  Administered 2022-07-29: 5 mg via ORAL
  Filled 2022-07-28: qty 1

## 2022-07-28 MED ORDER — ACETAMINOPHEN 325 MG PO TABS: 650.0000 mg | ORAL_TABLET | Freq: Four times a day (QID) | ORAL | Status: DC | PRN

## 2022-07-28 MED ORDER — DIPHENHYDRAMINE HCL 50 MG/ML IJ SOLN: 50.0000 mg | Freq: Three times a day (TID) | INTRAMUSCULAR | Status: DC | PRN

## 2022-07-28 MED ORDER — RISPERIDONE 1 MG PO TABS
1.0000 mg | ORAL_TABLET | Freq: Two times a day (BID) | ORAL | Status: DC
  Administered 2022-07-28 – 2022-07-30 (×4): 1 mg via ORAL
  Filled 2022-07-28 (×5): qty 1

## 2022-07-28 MED ORDER — LORAZEPAM 2 MG/ML IJ SOLN: 1.0000 mg | Freq: Three times a day (TID) | INTRAMUSCULAR | Status: DC | PRN

## 2022-07-28 MED ORDER — LORAZEPAM 1 MG PO TABS
1.0000 mg | ORAL_TABLET | Freq: Three times a day (TID) | ORAL | Status: DC | PRN
  Administered 2022-07-29: 1 mg via ORAL
  Filled 2022-07-28: qty 1

## 2022-07-28 MED ORDER — LITHIUM CARBONATE ER 450 MG PO TBCR
450.0000 mg | EXTENDED_RELEASE_TABLET | Freq: Every day | ORAL | Status: DC
  Administered 2022-07-29: 450 mg via ORAL
  Filled 2022-07-28 (×2): qty 1

## 2022-07-28 MED ORDER — ALUM & MAG HYDROXIDE-SIMETH 200-200-20 MG/5ML PO SUSP: 30.0000 mL | ORAL | Status: DC | PRN

## 2022-07-28 MED ORDER — HYDROXYZINE HCL 25 MG PO TABS: 25.0000 mg | ORAL_TABLET | Freq: Three times a day (TID) | ORAL | Status: DC | PRN

## 2022-07-28 MED ORDER — DIPHENHYDRAMINE HCL 50 MG PO CAPS: 50.0000 mg | ORAL_CAPSULE | Freq: Three times a day (TID) | ORAL | Status: DC | PRN

## 2022-07-28 MED ORDER — TRAZODONE HCL 50 MG PO TABS
50.0000 mg | ORAL_TABLET | Freq: Every evening | ORAL | Status: DC | PRN
  Administered 2022-07-29: 50 mg via ORAL
  Filled 2022-07-28 (×2): qty 1

## 2022-07-28 MED ORDER — HALOPERIDOL LACTATE 5 MG/ML IJ SOLN: 5.0000 mg | Freq: Three times a day (TID) | INTRAMUSCULAR | Status: DC | PRN

## 2022-07-28 NOTE — ED Notes (Signed)
[facility] lab courier called to transport labs to [facility] lab 

## 2022-07-28 NOTE — ED Notes (Signed)
Pt A&O x 4, sleeping at present, no distress noted.  Monitoring for safety. Denies SI.  Pt is IVC.
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
## 2022-07-28 NOTE — ED Notes (Signed)
Patient in milieu. Environment is secured. Will continue to monitor for safety. 

## 2022-07-28 NOTE — ED Notes (Signed)
Pt transferred  to obs denies  SI/HI/AVH. Calm, cooperative throughout interview process. Skin assessment completed. Oriented to unit. Meal and drink offered. At currrent, pt continue to denies SI/HI/AVH. Pt verbally contract for safety. Will monitor for safety.

## 2022-07-28 NOTE — ED Notes (Signed)
Pt refused all Meds.  
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
## 2022-07-28 NOTE — ED Notes (Signed)
Pt napping sitting up at bedside at present, remains guarded & resistant to care.  Monitoring for safety.

## 2022-07-28 NOTE — ED Notes (Signed)
Patient states that she still can not void will notify oncoming shift that she still need urine drug screen,preg test ans ua. Encourage patient to drink more fluids.Will continue to monitor.

## 2022-07-28 NOTE — ED Notes (Signed)
Patient states that she can not void at this time 
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
## 2022-07-29 DIAGNOSIS — F312 Bipolar disorder, current episode manic severe with psychotic features: Secondary | ICD-10-CM | POA: Diagnosis not present

## 2022-07-29 DIAGNOSIS — F411 Generalized anxiety disorder: Secondary | ICD-10-CM | POA: Diagnosis not present

## 2022-07-29 DIAGNOSIS — R9431 Abnormal electrocardiogram [ECG] [EKG]: Secondary | ICD-10-CM | POA: Diagnosis not present

## 2022-07-29 DIAGNOSIS — Z91148 Patient's other noncompliance with medication regimen for other reason: Secondary | ICD-10-CM | POA: Diagnosis not present

## 2022-07-29 LAB — URINALYSIS, ROUTINE W REFLEX MICROSCOPIC
Bilirubin Urine: NEGATIVE
Glucose, UA: NEGATIVE mg/dL
Hgb urine dipstick: NEGATIVE
Ketones, ur: NEGATIVE mg/dL
Leukocytes,Ua: NEGATIVE
Nitrite: NEGATIVE
Protein, ur: NEGATIVE mg/dL
Specific Gravity, Urine: 1.004 — ABNORMAL LOW (ref 1.005–1.030)
pH: 5 (ref 5.0–8.0)

## 2022-07-29 LAB — POCT URINE DRUG SCREEN - MANUAL ENTRY (I-SCREEN)
POC Amphetamine UR: NOT DETECTED
POC Buprenorphine (BUP): NOT DETECTED
POC Cocaine UR: NOT DETECTED
POC Marijuana UR: POSITIVE — AB
POC Methadone UR: NOT DETECTED
POC Methamphetamine UR: NOT DETECTED
POC Morphine: NOT DETECTED
POC Oxazepam (BZO): NOT DETECTED
POC Oxycodone UR: NOT DETECTED
POC Secobarbital (BAR): NOT DETECTED

## 2022-07-29 LAB — POC URINE PREG, ED: Preg Test, Ur: NEGATIVE

## 2022-07-29 NOTE — ED Notes (Signed)
Pt is in the shower 

## 2022-07-29 NOTE — Progress Notes (Signed)
LCSW Progress Note  [id number]   Patrice Aashika Carta  07/29/2022  3:23 PM    Inpatient Behavioral Health Placement  Pt meets inpatient criteria per Joaquin Courts, FNP. There are no available beds within [facility] per [facility] Antoinette Cillo, RN. Referral was sent to the following facilities;   Destination  Service Provider Address Phone [facility]  [address] [place] [address] [phone]  [facility]  [address] [place], [place] [address] [phone]  [facility]  [address] [place] [address] [phone]  [facility]  [address] [place], [place] [address] [phone]  [facility] [place] [address] [phone]  [facility]  [address] [place] [address] [phone]  [facility]  [address] [place], [place] [address] [phone]  [facility]  [address] [place] [address] [phone]  [facility]  [address] [place] [address] [phone]  [facility]  [address], [place] [address] [phone]  [facility]  [address] [place] [address] [phone]  [facility]  [address] [place] [address] [phone]  [facility]  [address] [place] [address] [phone]  [facility]  [address], [place] [address] [phone]  [facility]  [address], [place] [address] [phone]  [facility]  [address] [place]  [address] [phone]  [facility]  [address], [place] [address] [phone]  [facility]  [address], [place] [address] [phone]  [facility]  [address], [place] [address] [phone]  [facility]  [address], [place] [address] [phone]  [facility]  [address] [place] [address] [phone]  [facility]  [address] [place] [address] [phone]  [facility]  [address], [place] [address] [phone]  [facility]  [address] [place] [address] [phone]  [facility]  [name] [place], [place] [address] [phone]  [facility]  [address], [place] [address] [phone]    Situation ongoing,  CSW will follow up.    Maryjean Ka, MSW, [facility] 07/29/2022 3:23 PM

## 2022-07-29 NOTE — ED Notes (Signed)
Pt sleeping at present, no distress noted.  Monitoring for safety. 

## 2022-07-29 NOTE — Progress Notes (Addendum)
Pt was accepted to [facility] TOMORROW 07/30/22  Bed Assignment-[facility] unit  Pt meets inpatient criteria per Joaquin Courts, FNP   Attending Physician will be Dr. Guss Bunde  Report can be called to: - [phone]  Pt can arrive after: 9:00AM; Please call report right before pt leaves.  Care Team notified: Joaquin Courts, FNP, Magnus Ivan, RN, Hansel Starling, RN   Kelton Pillar, LCSWA 07/29/2022 @ 7:08 PM

## 2022-07-29 NOTE — ED Notes (Signed)
Pt A&O x 4, no distress noted, monitoring for safety. Calm & cooperative at present.  Comfort measures given.  Monitoring for safety.

## 2022-07-29 NOTE — ED Notes (Signed)
Patient A&Ox4. Patient denies SI/HI and AVH. Patient denies any physical complaints when asked. No acute distress noted. Support and encouragement provided. Routine safety checks conducted according to facility protocol. Encouraged patient to notify staff if thoughts of harm toward self or others arise. Patient verbalize understanding and agreement. Will continue to monitor for safety.    

## 2022-07-29 NOTE — ED Notes (Signed)
Patient appears agitated. Patient clapping hands loudly. Patient making loud noises and speaking unknown language. Patient will be medicated per MAR. Encouragement and support provided.

## 2022-07-29 NOTE — ED Notes (Signed)
Patient resting quietly in bed with eyes open, Respirations equal and unlabored, skin warm and dry, NAD. No change in assessment or acuity. Routine safety checks conducted according to facility protocol. Will continue to monitor for safety.   

## 2022-07-29 NOTE — ED Notes (Signed)
Patient sitting in chair.  Respirations equal and unlabored, skin warm and dry, NAD. Routine safety checks conducted according to facility protocol. Will continue to monitor for safety.

## 2022-07-29 NOTE — ED Provider Notes (Signed)
Behavioral Health Progress Note  Date and Time: 07/29/2022 8:36 AM Name: Cheryl Cohen MRN:  [id number]  Subjective:  ***  Diagnosis:  Final diagnoses:  Bipolar affective disorder, currently manic, severe, with psychotic features (HCC)    Total Time spent with patient: {Time; 15 min - 8 hours:17441}  Past Psychiatric History: *** Past Medical History: *** Family History: *** Family Psychiatric  History: *** Social History: ***  Additional Social History:    Pain Medications: Patient denies Prescriptions: Patient denies Over the Counter: Patient denies History of alcohol / drug use?: No history of alcohol / drug abuse Longest period of sobriety (when/how long): N/A Negative Consequences of Use:  (Denies) Withdrawal Symptoms:  (Denies)                    Sleep: {BHH GOOD/FAIR/POOR:22877}  Appetite:  {BHH GOOD/FAIR/POOR:22877}  Current Medications:  Current Facility-Administered Medications  Medication Dose Route Frequency Provider Last Rate Last Admin   acetaminophen (TYLENOL) tablet 650 mg  650 mg Oral Q6H PRN Foust, Katy L, NP       alum & mag hydroxide-simeth (MAALOX/MYLANTA) 200-200-20 MG/5ML suspension 30 mL  30 mL Oral Q4H PRN Foust, Katy L, NP       diphenhydrAMINE (BENADRYL) capsule 50 mg  50 mg Oral TID PRN Foust, Katy L, NP       Or   diphenhydrAMINE (BENADRYL) injection 50 mg  50 mg Intramuscular TID PRN Foust, Katy L, NP       haloperidol (HALDOL) tablet 5 mg  5 mg Oral TID PRN Foust, Katy L, NP       Or   haloperidol lactate (HALDOL) injection 5 mg  5 mg Intramuscular TID PRN Foust, Katy L, NP       hydrOXYzine (ATARAX) tablet 25 mg  25 mg Oral TID PRN Foust, Katy L, NP       lithium carbonate (ESKALITH) ER tablet 450 mg  450 mg Oral QHS Foust, Katy L, NP       LORazepam (ATIVAN) tablet 1 mg  1 mg Oral TID PRN Foust, Katy L, NP       Or   LORazepam (ATIVAN) injection 1 mg  1 mg Intramuscular TID PRN Foust, Katy L, NP       risperiDONE  (RISPERDAL) tablet 1 mg  1 mg Oral BID Foust, Katy L, NP   1 mg at 07/28/22 1321   traZODone (DESYREL) tablet 50 mg  50 mg Oral QHS PRN Foust, Katy L, NP       Current Outpatient Medications  Medication Sig Dispense Refill   lithium carbonate (ESKALITH) 450 MG ER tablet Take 1 tablet (450 mg total) by mouth at bedtime. 30 tablet 0   OLANZapine (ZYPREXA) 5 MG tablet Take 5 mg by mouth at bedtime.     propranolol (INDERAL) 10 MG tablet Take 1 tablet (10 mg total) by mouth 3 (three) times daily. 90 tablet 0   risperiDONE (RISPERDAL) 1 MG tablet Take 1 tablet (1 mg total) by mouth 2 (two) times daily. 60 tablet 0    Labs  Lab Results:  Admission on 07/28/2022  Component Date Value Ref Range Status   WBC 07/28/2022 5.9  4.0 - 10.5 K/uL Final   RBC 07/28/2022 4.36  3.87 - 5.11 MIL/uL Final   Hemoglobin 07/28/2022 13.7  12.0 - 15.0 g/dL Final   HCT 40/98/1191 40.2  36.0 - 46.0 % Final   MCV 07/28/2022 92.2  80.0 - 100.0 fL  Final   MCH 07/28/2022 31.4  26.0 - 34.0 pg Final   MCHC 07/28/2022 34.1  30.0 - 36.0 g/dL Final   RDW 16/01/9603 12.4  11.5 - 15.5 % Final   Platelets 07/28/2022 265  150 - 400 K/uL Final   nRBC 07/28/2022 0.0  0.0 - 0.2 % Final   Neutrophils Relative % 07/28/2022 41  % Final   Neutro Abs 07/28/2022 2.5  1.7 - 7.7 K/uL Final   Lymphocytes Relative 07/28/2022 39  % Final   Lymphs Abs 07/28/2022 2.3  0.7 - 4.0 K/uL Final   Monocytes Relative 07/28/2022 16  % Final   Monocytes Absolute 07/28/2022 0.9  0.1 - 1.0 K/uL Final   Eosinophils Relative 07/28/2022 3  % Final   Eosinophils Absolute 07/28/2022 0.2  0.0 - 0.5 K/uL Final   Basophils Relative 07/28/2022 1  % Final   Basophils Absolute 07/28/2022 0.0  0.0 - 0.1 K/uL Final   Immature Granulocytes 07/28/2022 0  % Final   Abs Immature Granulocytes 07/28/2022 0.01  0.00 - 0.07 K/uL Final   Performed at [facility], [address], [place], [place] [address]   Sodium 07/28/2022 136  135 - 145 mmol/L Final    Potassium 07/28/2022 3.4 (L)  3.5 - 5.1 mmol/L Final   Chloride 07/28/2022 102  98 - 111 mmol/L Final   CO2 07/28/2022 25  22 - 32 mmol/L Final   Glucose, Bld 07/28/2022 83  70 - 99 mg/dL Final   Glucose reference range applies only to samples taken after fasting for at least 8 hours.   BUN 07/28/2022 6  6 - 20 mg/dL Final   Creatinine, Ser 07/28/2022 0.75  0.44 - 1.00 mg/dL Final   Calcium 11/91/4782 8.7 (L)  8.9 - 10.3 mg/dL Final   Total Protein 95/62/1308 6.8  6.5 - 8.1 g/dL Final   Albumin 65/78/4696 3.7  3.5 - 5.0 g/dL Final   AST 29/52/8413 26  15 - 41 U/L Final   ALT 07/28/2022 41  0 - 44 U/L Final   Alkaline Phosphatase 07/28/2022 51  38 - 126 U/L Final   Total Bilirubin 07/28/2022 0.9  0.3 - 1.2 mg/dL Final   GFR, Estimated 07/28/2022 >60  >60 mL/min Final   Comment: (NOTE) Calculated using the CKD-EPI Creatinine Equation (2021)    Anion gap 07/28/2022 9  5 - 15 Final   Performed at [facility], [address] [place], [place] [address]   Magnesium 07/28/2022 2.3  1.7 - 2.4 mg/dL Final   Performed at [facility], [address], [place], [place] [address]   Alcohol, Ethyl (B) 07/28/2022 <10  <10 mg/dL Final   Comment: (NOTE) Lowest detectable limit for serum alcohol is 10 mg/dL.  For medical purposes only. Performed at [facility], [address], [place], [place] [address]    Preg Test, Ur 07/29/2022 Negative  Negative Final   Lithium Lvl 07/28/2022 <0.06 (L)  0.60 - 1.20 mmol/L Final   Performed at [facility], [address] [place], [place] [address]   Color, Urine 07/29/2022 YELLOW  YELLOW Final   APPearance 07/29/2022 HAZY (A)  CLEAR Final   Specific Gravity, Urine 07/29/2022 1.004 (L)  1.005 - 1.030 Final   pH 07/29/2022 5.0  5.0 - 8.0 Final   Glucose, UA 07/29/2022 NEGATIVE  NEGATIVE mg/dL Final   Hgb urine dipstick 07/29/2022 NEGATIVE  NEGATIVE Final   Bilirubin Urine 07/29/2022 NEGATIVE  NEGATIVE Final   Ketones, ur 07/29/2022  NEGATIVE  NEGATIVE mg/dL Final   Protein, ur 16/01/9603 NEGATIVE  NEGATIVE mg/dL Final   Nitrite 54/12/8117 NEGATIVE  NEGATIVE Final   Leukocytes,Ua 07/29/2022 NEGATIVE  NEGATIVE Final   Performed at [facility], [address], [place], [place] [address]   POC Amphetamine UR 07/29/2022 None Detected  NONE DETECTED (Cut Off Level 1000 ng/mL) Final   POC Secobarbital (BAR) 07/29/2022 None Detected  NONE DETECTED (Cut Off Level 300 ng/mL) Final   POC Buprenorphine (BUP) 07/29/2022 None Detected  NONE DETECTED (Cut Off Level 10 ng/mL) Final   POC Oxazepam (BZO) 07/29/2022 None Detected  NONE DETECTED (Cut Off Level 300 ng/mL) Final   POC Cocaine UR 07/29/2022 None Detected  NONE DETECTED (Cut Off Level 300 ng/mL) Final   POC Methamphetamine UR 07/29/2022 None Detected  NONE DETECTED (Cut Off Level 1000 ng/mL) Final   POC Morphine 07/29/2022 None Detected  NONE DETECTED (Cut Off Level 300 ng/mL) Final   POC Methadone UR 07/29/2022 None Detected  NONE DETECTED (Cut Off Level 300 ng/mL) Final   POC Oxycodone UR 07/29/2022 None Detected  NONE DETECTED (Cut Off Level 100 ng/mL) Final   POC Marijuana UR 07/29/2022 Positive (A)  NONE DETECTED (Cut Off Level 50 ng/mL) Final  Admission on 07/18/2022, Discharged on 07/24/2022  Component Date Value Ref Range Status   Lithium Lvl 07/22/2022 1.06  0.60 - 1.20 mmol/L Final   Performed at [facility], [address] [place], [place] [address]  Admission on 07/17/2022, Discharged on 07/18/2022  Component Date Value Ref Range Status   SARS Coronavirus 2 by RT PCR 07/17/2022 NEGATIVE  NEGATIVE Final   Performed at [facility], [address], [place], [place] [address]   WBC 07/17/2022 8.4  4.0 - 10.5 K/uL Final   RBC 07/17/2022 4.34  3.87 - 5.11 MIL/uL Final   Hemoglobin 07/17/2022 13.9  12.0 - 15.0 g/dL Final   HCT 78/46/9629 39.5  36.0 - 46.0 % Final   MCV 07/17/2022 91.0  80.0 - 100.0 fL Final   MCH 07/17/2022 32.0  26.0 - 34.0 pg  Final   MCHC 07/17/2022 35.2  30.0 - 36.0 g/dL Final   RDW 52/84/1324 13.0  11.5 - 15.5 % Final   Platelets 07/17/2022 282  150 - 400 K/uL Final   nRBC 07/17/2022 0.0  0.0 - 0.2 % Final   Neutrophils Relative % 07/17/2022 45  % Final   Neutro Abs 07/17/2022 3.8  1.7 - 7.7 K/uL Final   Lymphocytes Relative 07/17/2022 41  % Final   Lymphs Abs 07/17/2022 3.4  0.7 - 4.0 K/uL Final   Monocytes Relative 07/17/2022 11  % Final   Monocytes Absolute 07/17/2022 0.9  0.1 - 1.0 K/uL Final   Eosinophils Relative 07/17/2022 2  % Final   Eosinophils Absolute 07/17/2022 0.2  0.0 - 0.5 K/uL Final   Basophils Relative 07/17/2022 1  % Final   Basophils Absolute 07/17/2022 0.0  0.0 - 0.1 K/uL Final   Immature Granulocytes 07/17/2022 0  % Final   Abs Immature Granulocytes 07/17/2022 0.02  0.00 - 0.07 K/uL Final   Performed at [facility], [address], [place], [place] [address]   Sodium 07/17/2022 136  135 - 145 mmol/L Final   Potassium 07/17/2022 3.5  3.5 - 5.1 mmol/L Final   Chloride 07/17/2022 103  98 - 111 mmol/L Final   CO2 07/17/2022 25  22 -  32 mmol/L Final   Glucose, Bld 07/17/2022 96  70 - 99 mg/dL Final   Glucose reference range applies only to samples taken after fasting for at least 8 hours.   BUN 07/17/2022 6  6 - 20 mg/dL Final   Creatinine, Ser 07/17/2022 0.73  0.44 - 1.00 mg/dL Final   Calcium 25/36/6440 9.1  8.9 - 10.3 mg/dL Final   Total Protein 34/74/2595 6.9  6.5 - 8.1 g/dL Final   Albumin 63/87/5643 4.0  3.5 - 5.0 g/dL Final   AST 32/95/1884 30  15 - 41 U/L Final   ALT 07/17/2022 25  0 - 44 U/L Final   Alkaline Phosphatase 07/17/2022 46  38 - 126 U/L Final   Total Bilirubin 07/17/2022 1.3 (H)  0.3 - 1.2 mg/dL Final   GFR, Estimated 07/17/2022 >60  >60 mL/min Final   Comment: (NOTE) Calculated using the CKD-EPI Creatinine Equation (2021)    Anion gap 07/17/2022 8  5 - 15 Final   Performed at [facility], [address], [place], [place] [address]   Hgb A1c MFr  Bld 07/17/2022 5.4  4.8 - 5.6 % Final   Comment: (NOTE) Pre diabetes:          5.7%-6.4%  Diabetes:              >6.4%  Glycemic control for   <7.0% adults with diabetes    Mean Plasma Glucose 07/17/2022 108.28  mg/dL Final   Performed at [facility], [address] [place], [place] [address]   Magnesium 07/17/2022 2.3  1.7 - 2.4 mg/dL Final   Performed at [facility], [address] [place], [place] [address]   Alcohol, Ethyl (B) 07/17/2022 <10  <10 mg/dL Final   Comment: (NOTE) Lowest detectable limit for serum alcohol is 10 mg/dL.  For medical purposes only. Performed at [facility], [address], [place], [place] [address]    Cholesterol 07/17/2022 134  0 - 200 mg/dL Final   Triglycerides 22/05/5425 37  <150 mg/dL Final   HDL 09/24/7626 70  >40 mg/dL Final   Total CHOL/HDL Ratio 07/17/2022 1.9  RATIO Final   VLDL 07/17/2022 7  0 - 40 mg/dL Final   LDL Cholesterol 07/17/2022 57  0 - 99 mg/dL Final   Comment:        Total Cholesterol/HDL:CHD Risk Coronary Heart Disease Risk Table                     Men   Women  1/2 Average Risk   3.4   3.3  Average Risk       5.0   4.4  2 X Average Risk   9.6   7.1  3 X Average Risk  23.4   11.0        Use the calculated Patient Ratio above and the CHD Risk Table to determine the patient's CHD Risk.        ATP III CLASSIFICATION (LDL):  <100     mg/dL   Optimal  315-176  mg/dL   Near or Above                    Optimal  130-159  mg/dL   Borderline  160-737  mg/dL   High  >106     mg/dL   Very High Performed at [facility], [address] [place], [place] [address]    TSH  07/17/2022 1.192  0.350 - 4.500 uIU/mL Final   Comment: Performed by a 3rd Generation assay with a functional sensitivity of <=0.01 uIU/mL. Performed at [facility], [address] [place], [place] [address]    Color, Urine 07/17/2022 YELLOW  YELLOW Final   APPearance 07/17/2022 HAZY (A)  CLEAR Final   Specific  Gravity, Urine 07/17/2022 1.009  1.005 - 1.030 Final   pH 07/17/2022 8.0  5.0 - 8.0 Final   Glucose, UA 07/17/2022 NEGATIVE  NEGATIVE mg/dL Final   Hgb urine dipstick 07/17/2022 NEGATIVE  NEGATIVE Final   Bilirubin Urine 07/17/2022 NEGATIVE  NEGATIVE Final   Ketones, ur 07/17/2022 NEGATIVE  NEGATIVE mg/dL Final   Protein, ur 19/14/7829 NEGATIVE  NEGATIVE mg/dL Final   Nitrite 56/21/3086 NEGATIVE  NEGATIVE Final   Leukocytes,Ua 07/17/2022 NEGATIVE  NEGATIVE Final   Performed at [facility], [address] [place], [place] [address]   Preg Test, Ur 07/17/2022 Negative  Negative Final   POC Amphetamine UR 07/17/2022 None Detected  NONE DETECTED (Cut Off Level 1000 ng/mL) Final   POC Secobarbital (BAR) 07/17/2022 None Detected  NONE DETECTED (Cut Off Level 300 ng/mL) Final   POC Buprenorphine (BUP) 07/17/2022 None Detected  NONE DETECTED (Cut Off Level 10 ng/mL) Final   POC Oxazepam (BZO) 07/17/2022 None Detected  NONE DETECTED (Cut Off Level 300 ng/mL) Final   POC Cocaine UR 07/17/2022 None Detected  NONE DETECTED (Cut Off Level 300 ng/mL) Final   POC Methamphetamine UR 07/17/2022 None Detected  NONE DETECTED (Cut Off Level 1000 ng/mL) Final   POC Morphine 07/17/2022 None Detected  NONE DETECTED (Cut Off Level 300 ng/mL) Final   POC Methadone UR 07/17/2022 None Detected  NONE DETECTED (Cut Off Level 300 ng/mL) Final   POC Oxycodone UR 07/17/2022 None Detected  NONE DETECTED (Cut Off Level 100 ng/mL) Final   POC Marijuana UR 07/17/2022 Positive (A)  NONE DETECTED (Cut Off Level 50 ng/mL) Final   Neisseria Gonorrhea 07/17/2022 Negative   Final   Chlamydia 07/17/2022 Negative   Final   Comment 07/17/2022 Normal Reference Ranger Chlamydia - Negative   Final   Comment 07/17/2022 Normal Reference Range Neisseria Gonorrhea - Negative   Final   HIV Screen 4th Generation wRfx 07/17/2022 Non Reactive  Non Reactive Final   Performed at [facility], [address] [place], [place]  [address]   hCG, Conley Rolls, Sharene Butters, Kathie Rhodes 07/17/2022 <1  <5 mIU/mL Final   Comment:          GEST. AGE      CONC.  (mIU/mL)   <=1 WEEK        5 - 50     2 WEEKS       50 - 500     3 WEEKS       100 - 10,000     4 WEEKS     1,000 - 30,000     5 WEEKS     3,500 - 115,000   6-8 WEEKS     12,000 - 270,000    12 WEEKS     15,000 - 220,000        FEMALE AND NON-PREGNANT FEMALE:     LESS THAN 5 mIU/mL Performed at [facility], [address], [place], [place] [address]    SARSCOV2ONAVIRUS 2 AG 07/17/2022 NEGATIVE  NEGATIVE Final   Comment: (NOTE) SARS-CoV-2 antigen NOT DETECTED.   Negative results are presumptive.  Negative results  do not preclude SARS-CoV-2 infection and should not be used as the sole basis for treatment or other patient management decisions, including infection  control decisions, particularly in the presence of clinical signs and  symptoms consistent with COVID-19, or in those who have been in contact with the virus.  Negative results must be combined with clinical observations, patient history, and epidemiological information. The expected result is Negative.  Fact Sheet for Patients: https://www.jennings-kim.com/  Fact Sheet for Healthcare Providers: https://alexander-rogers.biz/  This test is not yet approved or cleared by the [place] FDA and  has been authorized for detection and/or diagnosis of SARS-CoV-2 by FDA under an Emergency Use Authorization (EUA).  This EUA will remain in effect (meaning this test can be used) for the duration of  the COV                          ID-19 declaration under Section 564(b)(1) of the Act, 21 U.S.C. section 360bbb-3(b)(1), unless the authorization is terminated or revoked sooner.    Admission on 07/17/2022, Discharged on 07/17/2022  Component Date Value Ref Range Status   Sodium 07/17/2022 133 (L)  135 - 145 mmol/L Final   Potassium 07/17/2022 3.2 (L)  3.5 - 5.1 mmol/L Final   Chloride  07/17/2022 102  98 - 111 mmol/L Final   CO2 07/17/2022 22  22 - 32 mmol/L Final   Glucose, Bld 07/17/2022 119 (H)  70 - 99 mg/dL Final   Glucose reference range applies only to samples taken after fasting for at least 8 hours.   BUN 07/17/2022 10  6 - 20 mg/dL Final   Creatinine, Ser 07/17/2022 0.67  0.44 - 1.00 mg/dL Final   Calcium 32/44/0102 8.6 (L)  8.9 - 10.3 mg/dL Final   Total Protein 72/53/6644 7.2  6.5 - 8.1 g/dL Final   Albumin 03/47/4259 4.1  3.5 - 5.0 g/dL Final   AST 56/38/7564 29  15 - 41 U/L Final   ALT 07/17/2022 20  0 - 44 U/L Final   Alkaline Phosphatase 07/17/2022 51  38 - 126 U/L Final   Total Bilirubin 07/17/2022 1.2  0.3 - 1.2 mg/dL Final   GFR, Estimated 07/17/2022 >60  >60 mL/min Final   Comment: (NOTE) Calculated using the CKD-EPI Creatinine Equation (2021)    Anion gap 07/17/2022 9  5 - 15 Final   Performed at [facility], [address] [place], [place] [address]   Alcohol, Ethyl (B) 07/17/2022 <10  <10 mg/dL Final   Comment: (NOTE) Lowest detectable limit for serum alcohol is 10 mg/dL.  For medical purposes only. Performed at [facility], [address], [place], [place] [address]    WBC 07/17/2022 9.6  4.0 - 10.5 K/uL Final   RBC 07/17/2022 4.28  3.87 - 5.11 MIL/uL Final   Hemoglobin 07/17/2022 13.4  12.0 - 15.0 g/dL Final   HCT 66/09/3014 39.2  36.0 - 46.0 % Final   MCV 07/17/2022 91.6  80.0 - 100.0 fL Final   MCH 07/17/2022 31.3  26.0 - 34.0 pg Final   MCHC 07/17/2022 34.2  30.0 - 36.0 g/dL Final   RDW 04/11/3233 13.0  11.5 - 15.5 % Final   Platelets 07/17/2022 285  150 - 400 K/uL Final   nRBC 07/17/2022 0.0  0.0 - 0.2 % Final   Neutrophils Relative % 07/17/2022 46  % Final   Neutro Abs 07/17/2022 4.5  1.7 - 7.7 K/uL Final  Lymphocytes Relative 07/17/2022 41  % Final   Lymphs Abs 07/17/2022 3.9  0.7 - 4.0 K/uL Final   Monocytes Relative 07/17/2022 10  % Final   Monocytes Absolute 07/17/2022 0.9  0.1 -  1.0 K/uL Final   Eosinophils Relative 07/17/2022 2  % Final   Eosinophils Absolute 07/17/2022 0.2  0.0 - 0.5 K/uL Final   Basophils Relative 07/17/2022 1  % Final   Basophils Absolute 07/17/2022 0.1  0.0 - 0.1 K/uL Final   Immature Granulocytes 07/17/2022 0  % Final   Abs Immature Granulocytes 07/17/2022 0.02  0.00 - 0.07 K/uL Final   Performed at [facility], [address] [place], [place] [address]   I-stat hCG, quantitative 07/17/2022 <5.0  <5 mIU/mL Final   Comment 3 07/17/2022          Final   Comment:   GEST. AGE      CONC.  (mIU/mL)   <=1 WEEK        5 - 50     2 WEEKS       50 - 500     3 WEEKS       100 - 10,000     4 WEEKS     1,000 - 30,000        FEMALE AND NON-PREGNANT FEMALE:     LESS THAN 5 mIU/mL    Salicylate Lvl 07/17/2022 <7.0 (L)  7.0 - 30.0 mg/dL Final   Performed at [facility], [address], [place], [place] [address]   Acetaminophen (Tylenol), Serum 07/17/2022 <10 (L)  10 - 30 ug/mL Final   Comment: (NOTE) Therapeutic concentrations vary significantly. A range of 10-30 ug/mL  may be an effective concentration for many patients. However, some  are best treated at concentrations outside of this range. Acetaminophen concentrations >150 ug/mL at 4 hours after ingestion  and >50 ug/mL at 12 hours after ingestion are often associated with  toxic reactions.  Performed at [facility], [address], [place], [place] [address]    Lithium Lvl 07/17/2022 0.07 (L)  0.60 - 1.20 mmol/L Final   Performed at [facility], [address] [place], [place] [address]   hCG, Conley Rolls, Quant, Kathie Rhodes 07/17/2022 <1  <5 mIU/mL Final   Comment:          GEST. AGE      CONC.  (mIU/mL)   <=1 WEEK        5 - 50     2 WEEKS       50 - 500     3 WEEKS       100 - 10,000     4 WEEKS     1,000 - 30,000     5 WEEKS     3,500 - 115,000   6-8 WEEKS     12,000 - 270,000    12 WEEKS     15,000 - 220,000         FEMALE AND NON-PREGNANT FEMALE:     LESS THAN 5 mIU/mL Performed at [facility], [address] [place], [place] [address]   Admission on 07/13/2022, Discharged on 07/13/2022  Component Date Value Ref Range Status   Preg Test, Ur 07/13/2022 NEGATIVE  NEGATIVE Final   Comment:        THE SENSITIVITY OF THIS METHODOLOGY IS >24 mIU/mL    Glucose, UA 07/13/2022 NEGATIVE  NEGATIVE mg/dL Final   Bilirubin  Urine 07/13/2022 NEGATIVE  NEGATIVE Final   Ketones, ur 07/13/2022 TRACE (A)  NEGATIVE mg/dL Final   Specific Gravity, Urine 07/13/2022 1.025  1.005 - 1.030 Final   Hgb urine dipstick 07/13/2022 NEGATIVE  NEGATIVE Final   pH 07/13/2022 6.0  5.0 - 8.0 Final   Protein, ur 07/13/2022 NEGATIVE  NEGATIVE mg/dL Final   Urobilinogen, UA 07/13/2022 0.2  0.0 - 1.0 mg/dL Final   Nitrite 46/96/2952 NEGATIVE  NEGATIVE Final   Leukocytes,Ua 07/13/2022 NEGATIVE  NEGATIVE Final   Biochemical Testing Only. Please order routine urinalysis from main lab if confirmatory testing is needed.   hCG, Beta Chain, Quant, S 07/13/2022 <1  <5 mIU/mL Final   Comment:          GEST. AGE      CONC.  (mIU/mL)   <=1 WEEK        5 - 50     2 WEEKS       50 - 500     3 WEEKS       100 - 10,000     4 WEEKS     1,000 - 30,000     5 WEEKS     3,500 - 115,000   6-8 WEEKS     12,000 - 270,000    12 WEEKS     15,000 - 220,000        FEMALE AND NON-PREGNANT FEMALE:     LESS THAN 5 mIU/mL Performed at [facility], [address], [place], [place] [address]   Office Visit on 07/06/2022  Component Date Value Ref Range Status   TSH 07/06/2022 0.81  0.35 - 5.50 uIU/mL Final   Free T4 07/06/2022 1.17  0.60 - 1.60 ng/dL Final   Comment: Specimens from patients who are undergoing biotin therapy and /or ingesting biotin supplements may contain high levels of biotin.  The higher biotin concentration in these specimens interferes with this Free T4 assay.  Specimens that contain high levels  of biotin may  cause false high results for this Free T4 assay.  Please interpret results in light of the total clinical presentation of the patient.     Sodium 07/06/2022 139  135 - 145 mEq/L Final   Potassium 07/06/2022 4.0  3.5 - 5.1 mEq/L Final   Chloride 07/06/2022 103  96 - 112 mEq/L Final   CO2 07/06/2022 29  19 - 32 mEq/L Final   Glucose, Bld 07/06/2022 96  70 - 99 mg/dL Final   BUN 44/04/270 10  6 - 23 mg/dL Final   Creatinine, Ser 07/06/2022 0.80  0.40 - 1.20 mg/dL Final   Total Bilirubin 07/06/2022 0.7  0.2 - 1.2 mg/dL Final   Alkaline Phosphatase 07/06/2022 51  39 - 117 U/L Final   AST 07/06/2022 15  0 - 37 U/L Final   ALT 07/06/2022 11  0 - 35 U/L Final   Total Protein 07/06/2022 7.9  6.0 - 8.3 g/dL Final   Albumin 53/66/4403 4.8  3.5 - 5.2 g/dL Final   GFR 47/42/5956 98.96  >60.00 mL/min Final   Calculated using the CKD-EPI Creatinine Equation (2021)   Calcium 07/06/2022 10.1  8.4 - 10.5 mg/dL Final   WBC 38/75/6433 7.7  4.0 - 10.5 K/uL Final   RBC 07/06/2022 4.78  3.87 - 5.11 Mil/uL Final   Hemoglobin 07/06/2022 15.1 (H)  12.0 - 15.0 g/dL Final   HCT 29/51/8841 43.9  36.0 - 46.0 % Final   MCV 07/06/2022 91.9  78.0 - 100.0 fl Final   MCHC 07/06/2022 34.4  30.0 - 36.0 g/dL Final   RDW 11/91/4782 13.4  11.5 - 15.5 % Final   Platelets 07/06/2022 314.0  150.0 - 400.0 K/uL Final   Neutrophils Relative % 07/06/2022 47.8  43.0 - 77.0 % Final   Lymphocytes Relative 07/06/2022 38.3  12.0 - 46.0 % Final   Monocytes Relative 07/06/2022 11.8  3.0 - 12.0 % Final   Eosinophils Relative 07/06/2022 1.8  0.0 - 5.0 % Final   Basophils Relative 07/06/2022 0.3  0.0 - 3.0 % Final   Neutro Abs 07/06/2022 3.7  1.4 - 7.7 K/uL Final   Lymphs Abs 07/06/2022 3.0  0.7 - 4.0 K/uL Final   Monocytes Absolute 07/06/2022 0.9  0.1 - 1.0 K/uL Final   Eosinophils Absolute 07/06/2022 0.1  0.0 - 0.7 K/uL Final   Basophils Absolute 07/06/2022 0.0  0.0 - 0.1 K/uL Final   HIV 1&2 Ab, 4th Generation 07/06/2022  NON-REACTIVE  NON-REACTIVE Final   Comment: HIV-1 antigen and HIV-1/HIV-2 antibodies were not detected. There is no laboratory evidence of HIV infection. Marland Kitchen PLEASE NOTE: This information has been disclosed to you from records whose confidentiality may be protected by state law.  If your state requires such protection, then the state law prohibits you from making any further disclosure of the information without the specific written consent of the person to whom it pertains, or as otherwise permitted by law. A general authorization for the release of medical or other information is NOT sufficient for this purpose. . For additional information please refer to http://education.questdiagn[employer].com/faq/FAQ106 (This link is being provided for informational/ educational purposes only.) . Marland Kitchen The performance of this assay has not been clinically validated in patients less than 67 years old. .   Office Visit on 05/24/2022  Component Date Value Ref Range Status   High risk HPV 05/24/2022 Negative   Final   Adequacy 05/24/2022 Satisfactory for evaluation; transformation zone component PRESENT.   Final   Diagnosis 05/24/2022 - Negative for intraepithelial lesion or malignancy (NILM)   Final   Microorganisms 05/24/2022 Shift in flora suggestive of bacterial vaginosis   Final   Comment 05/24/2022 Normal Reference Range HPV - Negative   Final   Neisseria Gonorrhea 05/24/2022 Negative   Final   Chlamydia 05/24/2022 Negative   Final   Trichomonas 05/24/2022 Negative   Final   Bacterial Vaginitis (gardnerella) 05/24/2022 Positive (A)   Final   Candida Vaginitis 05/24/2022 Negative   Final   Candida Glabrata 05/24/2022 Negative   Final   Comment 05/24/2022 Normal Reference Range Bacterial Vaginosis - Negative   Final   Comment 05/24/2022 Normal Reference Range Candida Species - Negative   Final   Comment 05/24/2022 Normal Reference Range Candida Galbrata - Negative   Final   Comment 05/24/2022  Normal Reference Range Trichomonas - Negative   Final   Comment 05/24/2022 Normal Reference Ranger Chlamydia - Negative   Final   Comment 05/24/2022 Normal Reference Range Neisseria Gonorrhea - Negative   Final   Hep C Virus Ab 05/24/2022 Non Reactive  Non Reactive Final   Comment: HCV antibody alone does not differentiate between previously resolved infection and active infection. Equivocal and Reactive HCV antibody results should be followed up with an HCV RNA test to support the diagnosis of active HCV infection.     Blood Alcohol level:  Lab Results  Component Value Date   ETH <10 07/28/2022   ETH <10 07/17/2022  Metabolic Disorder Labs: Lab Results  Component Value Date   HGBA1C 5.4 07/17/2022   MPG 108.28 07/17/2022   MPG 111 02/22/2016   Lab Results  Component Value Date   PROLACTIN 92.6 (H) 02/22/2016   PROLACTIN  10/31/2009    35.4 (NOTE)     Reference Ranges:                 Female:                       2.1 -  17.1 ng/ml                 Female:   Pregnant          9.7 - 208.5 ng/mL                           Non Pregnant      2.8 -  29.2 ng/mL                           Post  Menopausal   1.8 -  20.3 ng/mL                     Lab Results  Component Value Date   CHOL 134 07/17/2022   TRIG 37 07/17/2022   HDL 70 07/17/2022   CHOLHDL 1.9 07/17/2022   VLDL 7 07/17/2022   LDLCALC 57 07/17/2022   LDLCALC 62 02/22/2016    Therapeutic Lab Levels: Lab Results  Component Value Date   LITHIUM <0.06 (L) 07/28/2022   LITHIUM 1.06 07/22/2022   No results found for: "VALPROATE" No results found for: "CBMZ"  Physical Findings   AIMS    Flowsheet Row [facility]  AIMS Total Score 0      AUDIT    Flowsheet Row Admission (Discharged[facility]  Alcohol Use Disorder Identification Test Final Score (AUDIT) 0      GAD-7     Flowsheet Row Routine Prenatal from 08/20/2020 in [facility] for [facility] [place]  Total GAD-7 Score 0      PHQ2-9    Flowsheet Row Office Visit from 05/24/2022 in [facility] for [facility] at [place] Nutrition from 08/25/2020 in [facility] at [facility] Routine Prenatal from 08/20/2020 in [facility] for [facility] at [place] Initial Prenatal from 06/25/2020 in [facility] for [facility] at [place] Office Visit from 01/22/2017 in [facility] at [facility] Total Score 0 0 0 0 0  PHQ-9 Total Score 0 -- 0 1 --      Flowsheet Row ED from 07/28/2022 in [facility] Admission (Discharged) [facility] ED from 07/17/2022 in [facility]  C-SSRS RISK CATEGORY No Risk No Risk No Risk        Musculoskeletal  Strength & Muscle Tone: {desc; muscle tone:32375} Gait & Station: {PE GAIT ED ZOXW:96045} Patient leans: {Patient Leans:21022755}  Psychiatric Specialty Exam  Presentation  General Appearance:  Appropriate for Environment  Eye Contact: Fair  Speech: Clear and Coherent  Speech Volume: Normal  Handedness: Right   Mood and Affect  Mood: Labile; Anxious  Affect: Appropriate   Thought Process  Thought  Processes: Coherent  Descriptions of Associations:Tangential  Orientation:Full (Time, Place and Person)  Thought Content:Delusions  Diagnosis of Schizophrenia or Schizoaffective disorder in past: No  Duration of Psychotic Symptoms: Less than six months   Hallucinations:No data recorded Ideas of Reference:Delusions  Suicidal Thoughts:Suicidal Thoughts: No  Homicidal Thoughts:Homicidal Thoughts: No   Sensorium  Memory: Immediate Good  Judgment: Poor  Insight: Lacking   Executive Functions  Concentration: Fair  Attention  Span: Fair  Recall: Fair  Fund of Knowledge: Fair  Language: Good   Psychomotor Activity  Psychomotor Activity:No data recorded  Assets  Assets: Housing; Physical Health   Sleep  Sleep: Sleep: Fair Number of Hours of Sleep: 6   No data recorded  Physical Exam  Physical Exam ROS Blood pressure 115/82, pulse 77, temperature 97.8 F (36.6 C), temperature source Oral, resp. rate 18, last menstrual period 06/30/2022, SpO2 100 %, unknown if currently breastfeeding. There is no height or weight on file to calculate BMI.  Treatment Plan Summary: {CHL [facility] MD [place]. ZOXW:960454098}  Joaquin Courts, NP 07/29/2022 8:36 AM

## 2022-07-29 NOTE — ED Notes (Signed)
Patient was spitting on the floor, clapping, pacing and talking loudly to self.  Patient offered ativan and accepted it PO.  Patient encouraged to attempt to rest.  Will monitor.

## 2022-07-30 DIAGNOSIS — F332 Major depressive disorder, recurrent severe without psychotic features: Secondary | ICD-10-CM | POA: Diagnosis not present

## 2022-07-30 DIAGNOSIS — F250 Schizoaffective disorder, bipolar type: Secondary | ICD-10-CM | POA: Diagnosis not present

## 2022-07-30 DIAGNOSIS — F312 Bipolar disorder, current episode manic severe with psychotic features: Secondary | ICD-10-CM | POA: Diagnosis not present

## 2022-07-30 NOTE — Discharge Instructions (Signed)
   Pt was accepted to [facility] TOMORROW 07/30/22  Bed Assignment-[facility] unit   Pt meets inpatient criteria per Joaquin Courts, FNP    Attending Physician will be Dr. Guss Bunde   Report can be called to: - [phone]

## 2022-07-30 NOTE — ED Notes (Signed)
Patient A&Ox4. Patient denies SI/HI and AVH. Patient denies any physical complaints when asked. No acute distress noted. Support and encouragement provided. Routine safety checks conducted according to facility protocol. Encouraged patient to notify staff if thoughts of harm toward self or others arise. Patient verbalize understanding and agreement. Will continue to monitor for safety.    

## 2022-07-30 NOTE — ED Notes (Signed)
Patient discharge via ambulatory with a steady gait with [facility] department staff member. Respirations equal and unlabored, skin warm and dry. No acute distress noted. Belongings returned intact from locker 21.

## 2022-07-30 NOTE — ED Notes (Signed)
[facility] requested to [facility] after 9am this morning.  Pt is IVC.

## 2022-07-30 NOTE — ED Notes (Signed)
Pt sleeping at present, no distress noted.  Monitoring for safety. 

## 2022-07-30 NOTE — ED Provider Notes (Signed)
FBC/OBS ASAP Discharge Summary  Date and Time: 07/30/2022 8:11 AM  Name: Cheryl Cohen  MRN:  [id number]   Discharge Diagnoses:  Final diagnoses:  Bipolar affective disorder, currently manic, severe, with psychotic features [facility])   Per admission assessment note: patient was admitted due involuntary commitment.  " Per IVC paperwork family believes patient to be manic and behaviors have become last 24 hours. She has been speaking in [place] accent, tongues, and an unknown language. Patient has become combative with brother as well as daycare staff when she was attempting to see her son. Patient also believes that she is pregnant  "Contacted mother [name] [phone]) who initiated IVC who reports that patient has not been taking her medications since being dischared from [facility] on 4/22. Mother reports patient has not been sleeping and (see content of IVC) was delusional within 48 hours of discharge. To note patient has a 31 year old that she was picking up today per IVC at Daycare when she had a altercation with staff there. Mother [name]) has confirmed that child is in her custody at this time."   Total Time spent with patient: 15 minutes   Tobacco Cessation:  N/A, patient does not currently use tobacco products  Current Medications:  Current Facility-Administered Medications  Medication Dose Route Frequency Provider Last Rate Last Admin   acetaminophen (TYLENOL) tablet 650 mg  650 mg Oral Q6H PRN Foust, Katy L, NP       alum & mag hydroxide-simeth (MAALOX/MYLANTA) 200-200-20 MG/5ML suspension 30 mL  30 mL Oral Q4H PRN Foust, Katy L, NP       diphenhydrAMINE (BENADRYL) capsule 50 mg  50 mg Oral TID PRN Foust, Katy L, NP       Or   diphenhydrAMINE (BENADRYL) injection 50 mg  50 mg Intramuscular TID PRN Foust, Katy L, NP       haloperidol (HALDOL) tablet 5 mg  5 mg Oral TID PRN Foust, Katy L, NP   5 mg at 07/29/22 1404   Or   haloperidol lactate (HALDOL) injection 5 mg  5  mg Intramuscular TID PRN Foust, Katy L, NP       hydrOXYzine (ATARAX) tablet 25 mg  25 mg Oral TID PRN Foust, Katy L, NP       lithium carbonate (ESKALITH) ER tablet 450 mg  450 mg Oral QHS Foust, Katy L, NP   450 mg at 07/29/22 2137   LORazepam (ATIVAN) tablet 1 mg  1 mg Oral TID PRN [name] Limbo L, NP   1 mg at 07/29/22 1707   Or   LORazepam (ATIVAN) injection 1 mg  1 mg Intramuscular TID PRN Foust, Katy L, NP       risperiDONE (RISPERDAL) tablet 1 mg  1 mg Oral BID Foust, Katy L, NP   1 mg at 07/29/22 2137   traZODone (DESYREL) tablet 50 mg  50 mg Oral QHS PRN Foust, Katy L, NP   50 mg at 07/29/22 2137   Current Outpatient Medications  Medication Sig Dispense Refill   lithium carbonate (ESKALITH) 450 MG ER tablet Take 1 tablet (450 mg total) by mouth at bedtime. 30 tablet 0   OLANZapine (ZYPREXA) 5 MG tablet Take 5 mg by mouth at bedtime.     propranolol (INDERAL) 10 MG tablet Take 1 tablet (10 mg total) by mouth 3 (three) times daily. 90 tablet 0   risperiDONE (RISPERDAL) 1 MG tablet Take 1 tablet (1 mg total) by mouth 2 (  two) times daily. 60 tablet 0    PTA Medications:  Facility Ordered Medications  Medication   acetaminophen (TYLENOL) tablet 650 mg   alum & mag hydroxide-simeth (MAALOX/MYLANTA) 200-200-20 MG/5ML suspension 30 mL   hydrOXYzine (ATARAX) tablet 25 mg   lithium carbonate (ESKALITH) ER tablet 450 mg   diphenhydrAMINE (BENADRYL) capsule 50 mg   Or   diphenhydrAMINE (BENADRYL) injection 50 mg   LORazepam (ATIVAN) tablet 1 mg   Or   LORazepam (ATIVAN) injection 1 mg   haloperidol (HALDOL) tablet 5 mg   Or   haloperidol lactate (HALDOL) injection 5 mg   traZODone (DESYREL) tablet 50 mg   risperiDONE (RISPERDAL) tablet 1 mg   PTA Medications  Medication Sig   lithium carbonate (ESKALITH) 450 MG ER tablet Take 1 tablet (450 mg total) by mouth at bedtime.   propranolol (INDERAL) 10 MG tablet Take 1 tablet (10 mg total) by mouth 3 (three) times daily.   risperiDONE  (RISPERDAL) 1 MG tablet Take 1 tablet (1 mg total) by mouth 2 (two) times daily.   OLANZapine (ZYPREXA) 5 MG tablet Take 5 mg by mouth at bedtime.       05/24/2022   11:33 AM 08/25/2020    5:33 PM 08/20/2020    9:13 AM  Depression screen PHQ 2/9  Decreased Interest 0 0 0  Down, Depressed, Hopeless 0 0 0  PHQ - 2 Score 0 0 0  Altered sleeping 0  0  Tired, decreased energy 0  0  Change in appetite 0  0  Feeling bad or failure about yourself  0  0  Trouble concentrating 0  0  Moving slowly or fidgety/restless 0  0  Suicidal thoughts 0  0  PHQ-9 Score 0  0  Difficult doing work/chores Not difficult at all      Flowsheet Row ED from 07/28/2022 in [facility] Ad[facility] (Discharged[facility] from 07/17/2022 in [facility]  C-SSRS RISK CATEGORY No Risk No Risk No Risk       Musculoskeletal  Strength & Muscle Tone: within normal limits Gait & Station: normal Patient leans: N/A  Psychiatric Specialty Exam  Presentation  General Appearance:  Appropriate for Environment  Eye Contact: Fair  Speech: Clear and Coherent  Speech Volume: Normal  Handedness: Right   Mood and Affect  Mood: Labile; Anxious  Affect: Appropriate   Thought Process  Thought Processes: Coherent  Descriptions of Associations:Tangential  Orientation:Full (Time, Place and Person)  Thought Content:Delusions  Diagnosis of Schizophrenia or Schizoaffective disorder in past: No  Duration of Psychotic Symptoms: Less than six months   Hallucinations:No data recorded Ideas of Reference:Delusions  Suicidal Thoughts:No data recorded Homicidal Thoughts:No data recorded  Sensorium  Memory: Immediate Good  Judgment: Poor  Insight: Lacking   Executive Functions  Concentration: Fair  Attention Span: Fair  Recall: Fair  Fund of  Knowledge: Fair  Language: Good   Psychomotor Activity  Psychomotor Activity:No data recorded  Assets  Assets: Housing; Physical Health   Sleep  Sleep:No data recorded  No data recorded  Physical Exam  Physical Exam Vitals and nursing note reviewed.  Constitutional:      Appearance: Normal appearance.  Cardiovascular:     Rate and Rhythm: Normal rate and regular rhythm.  Skin:    General: Skin is warm and dry.  Neurological:     Mental Status: She is alert and oriented to person,  place, and time.  Psychiatric:        Mood and Affect: Mood normal.        Behavior: Behavior normal.    Review of Systems  Psychiatric/Behavioral:  Positive for hallucinations. The patient is nervous/anxious.   All other systems reviewed and are negative.  Blood pressure 103/64, pulse 87, temperature 98.3 F (36.8 C), temperature source Oral, resp. rate 16, last menstrual period 06/30/2022, SpO2 100 %, unknown if currently breastfeeding. There is no height or weight on file to calculate BMI.  Demographic Factors:  NA  Loss Factors: NA  Historical Factors: NA  Risk Reduction Factors:   NA  Continued Clinical Symptoms:  Bipolar Disorder:   Mixed State  Cognitive Features That Contribute To Risk:  Closed-mindedness    Suicide Risk:  Minimal: No identifiable suicidal ideation.  Patients presenting with no risk factors but with morbid ruminations; may be classified as minimal risk based on the severity of the depressive symptoms  Plan Of Care/Follow-up recommendations:  Activity:  as tolerated Diet:  heart healthy   Disposition:     Pt was accepted to [facility] TOMORROW 07/30/22  Bed Assignment-[facility] unit   Pt meets inpatient criteria per Joaquin Courts, FNP    Attending Physician will be Dr. Guss Bunde   Report can be called to: - [phone]      Oneta Rack, NP 07/30/2022, 8:11 AM

## 2022-07-31 DIAGNOSIS — F3163 Bipolar disorder, current episode mixed, severe, without psychotic features: Secondary | ICD-10-CM | POA: Diagnosis not present

## 2022-07-31 DIAGNOSIS — F250 Schizoaffective disorder, bipolar type: Secondary | ICD-10-CM | POA: Diagnosis not present

## 2022-08-01 DIAGNOSIS — F3163 Bipolar disorder, current episode mixed, severe, without psychotic features: Secondary | ICD-10-CM | POA: Diagnosis not present

## 2022-08-02 DIAGNOSIS — F3163 Bipolar disorder, current episode mixed, severe, without psychotic features: Secondary | ICD-10-CM | POA: Diagnosis not present

## 2022-08-05 DIAGNOSIS — F250 Schizoaffective disorder, bipolar type: Secondary | ICD-10-CM | POA: Diagnosis not present

## 2022-08-06 DIAGNOSIS — F250 Schizoaffective disorder, bipolar type: Secondary | ICD-10-CM | POA: Diagnosis not present

## 2022-08-07 ENCOUNTER — Ambulatory Visit: Payer: BC Managed Care – PPO

## 2022-08-07 DIAGNOSIS — F250 Schizoaffective disorder, bipolar type: Secondary | ICD-10-CM | POA: Diagnosis not present

## 2022-08-08 DIAGNOSIS — F250 Schizoaffective disorder, bipolar type: Secondary | ICD-10-CM | POA: Diagnosis not present

## 2022-08-09 DIAGNOSIS — F250 Schizoaffective disorder, bipolar type: Secondary | ICD-10-CM | POA: Diagnosis not present

## 2022-08-15 ENCOUNTER — Telehealth: Payer: Self-pay

## 2022-08-15 NOTE — Telephone Encounter (Signed)
   Per Dr. Fabian Sharp,   Provider  is not doing psych med for pt if that's what pt is needed.   Attempted to reach pt to further to discuss. Left a voicemail to call us back.

## 2022-08-16 ENCOUNTER — Other Ambulatory Visit: Payer: Self-pay

## 2022-08-16 ENCOUNTER — Telehealth: Payer: Self-pay | Admitting: Internal Medicine

## 2022-08-16 ENCOUNTER — Encounter (HOSPITAL_BASED_OUTPATIENT_CLINIC_OR_DEPARTMENT_OTHER): Payer: Self-pay

## 2022-08-16 ENCOUNTER — Emergency Department (HOSPITAL_BASED_OUTPATIENT_CLINIC_OR_DEPARTMENT_OTHER)
Admission: EM | Admit: 2022-08-16 | Discharge: 2022-08-16 | Disposition: A | Discharge status: Home / Self Care | Payer: BC Managed Care – PPO | Attending: Emergency Medicine | Admitting: Emergency Medicine

## 2022-08-16 DIAGNOSIS — Z3202 Encounter for pregnancy test, result negative: Secondary | ICD-10-CM | POA: Diagnosis not present

## 2022-08-16 DIAGNOSIS — N938 Other specified abnormal uterine and vaginal bleeding: Secondary | ICD-10-CM | POA: Insufficient documentation

## 2022-08-16 LAB — URINALYSIS, ROUTINE W REFLEX MICROSCOPIC
Bilirubin Urine: NEGATIVE
Glucose, UA: NEGATIVE mg/dL
Ketones, ur: NEGATIVE mg/dL
Leukocytes,Ua: NEGATIVE
Nitrite: NEGATIVE
Protein, ur: NEGATIVE mg/dL
Specific Gravity, Urine: 1.022 (ref 1.005–1.030)
pH: 6.5 (ref 5.0–8.0)

## 2022-08-16 LAB — PREGNANCY, URINE: Preg Test, Ur: NEGATIVE

## 2022-08-16 NOTE — Telephone Encounter (Signed)
She was seen in ed for vag bleeding thinking she is pregnant and is not ( hx of same  thinking )

## 2022-08-16 NOTE — ED Triage Notes (Signed)
Patient reports that she has had vaginal bleeding and that she is pregnant (as she pulls up shirt to show nurse extended abd), Patient reports states that she just got out of [facility]. Patient reports that she suffers from bipolar disorder. Patient states she is using her first pad right now. Last full cycle was 3/29, had spotting on 4/21.

## 2022-08-16 NOTE — Telephone Encounter (Signed)
Error - Duplicate - Please disregard 

## 2022-08-16 NOTE — Discharge Instructions (Signed)
You were seen today for concerns of pregnancy related symptoms and requesting a pregnancy test.  Your pregnancy test is negative today.  You declined further evaluation.  Follow-up with your primary physician.

## 2022-08-16 NOTE — Telephone Encounter (Signed)
2nd attempt to reach pt. Left a voicemail to call us back.   Routing to provider for [employer]

## 2022-08-16 NOTE — ED Provider Notes (Signed)
[place] EMERGENCY DEPARTMENT AT [facility] Provider Note   CSN: [id number] Arrival date & time: 08/16/22  0448     History  Chief Complaint  Patient presents with   Vaginal Bleeding    Cheryl Cohen is a 32 y.o. female.  HPI     This is a 32 year old with a history of bipolar disorder who presents with multiple complaints and requesting a pregnancy test.  She states that she believes she is pregnant.  She describes breast tenderness, abdominal fullness, fetal movement, vaginal spotting.  Last menstrual period was 3/29.  She reports her prior menstrual cycles have been irregular.  She has had multiple prior pregnancies including intrauterine fetal demise.  Of note, patient was just admitted to [facility] for psychosis.  She stated that she did not want to talk about that.  At that time her pregnancy test on 4/27 was negative.  Home Medications Prior to Admission medications   Medication Sig Start Date End Date Taking? Authorizing Provider  lithium carbonate (ESKALITH) 450 MG ER tablet Take 1 tablet (450 mg total) by mouth at bedtime. 07/24/22   Starleen Blue, NP  OLANZapine (ZYPREXA) 5 MG tablet Take 5 mg by mouth at bedtime. 07/26/22   [provider]  propranolol (INDERAL) 10 MG tablet Take 1 tablet (10 mg total) by mouth 3 (three) times daily. 07/24/22   Starleen Blue, NP  risperiDONE (RISPERDAL) 1 MG tablet Take 1 tablet (1 mg total) by mouth 2 (two) times daily. 07/24/22   Starleen Blue, NP      Allergies    Amoxicillin    Review of Systems   Review of Systems  Gastrointestinal:  Positive for nausea.  Genitourinary:  Positive for vaginal bleeding.  All other systems reviewed and are negative.   Physical Exam Updated Vital Signs BP 125/81   Pulse 92   Temp 97.8 F (36.6 C)   Resp 18   Ht 1.702 m (5\' 7" )   Wt 63.5 kg   LMP 07/23/2022 Comment: full cycle 06/30/22, spotting 07/23/22  SpO2 100%   BMI 21.93 kg/m  Physical Exam Vitals  and nursing note reviewed.  Constitutional:      Appearance: She is well-developed. She is not ill-appearing.  HENT:     Head: Normocephalic and atraumatic.  Eyes:     Pupils: Pupils are equal, round, and reactive to light.  Cardiovascular:     Rate and Rhythm: Normal rate and regular rhythm.     Heart sounds: Normal heart sounds.  Pulmonary:     Effort: Pulmonary effort is normal. No respiratory distress.     Breath sounds: No wheezing.  Abdominal:     Palpations: Abdomen is soft.     Comments: Soft but protuberant abdomen, no palpable uterus  Genitourinary:    Comments: Deferred, declined by patient Musculoskeletal:     Cervical back: Neck supple.  Skin:    General: Skin is warm and dry.  Neurological:     Mental Status: She is alert and oriented to person, place, and time.  Psychiatric:     Comments: Odd affect, nonpressured speech     ED Results / Procedures / Treatments   Labs (all labs ordered are listed, but only abnormal results are displayed) Labs Reviewed  URINALYSIS, ROUTINE W REFLEX MICROSCOPIC - Abnormal; Notable for the following components:      Result Value   Hgb urine dipstick MODERATE (*)    Bacteria, UA RARE (*)    All other  components within normal limits  PREGNANCY, URINE    EKG None  Radiology No results found.  Procedures Procedures    Medications Ordered in ED Medications - No data to display  ED Course/ Medical Decision Making/ A&P                             Medical Decision Making Amount and/or Complexity of Data Reviewed Labs: ordered.   This patient presents to the ED for concern of pregnancy symptoms, this involves an extensive number of treatment options, and is a complaint that carries with it a high risk of complications and morbidity.  I considered the following differential and admission for this acute, potentially life threatening condition.  The differential diagnosis includes pregnancy, dysmenorrhea, psychiatric  disorder  MDM:    This is a 32 year old female who presents requesting a pregnancy test.  Reports multiple symptoms that she relates to early pregnancy symptoms.  She just had a negative pregnancy test on 4/27.  She has since had a hospitalization for psychosis.  Physical exam does not suggest any advanced pregnancy findings.  Urine pregnancy is negative.  Urinalysis does not show any evidence of UTI.  Patient was informed of these findings.  I requested pelvic exam but the patient declined and stated that she wanted to be discharged.  She stated "I guess this baby will just have to fall out of me."  While she does not appear to have a clear grasp on reality, she does not at this time appear to be a harm to herself or others.  (Labs, imaging, consults)  Labs: I Ordered, and personally interpreted labs.  The pertinent results include: Urine pregnancy, urinalysis  Imaging Studies ordered: I ordered imaging studies including none I independently visualized and interpreted imaging. I agree with the radiologist interpretation  Additional history obtained from chart review.  External records from outside source obtained and reviewed including recent behavioral health urgent care notes  Cardiac Monitoring: The patient was not maintained on a cardiac monitor.  If on the cardiac monitor, I personally viewed and interpreted the cardiac monitored which showed an underlying rhythm of: N/A  Reevaluation: After the interventions noted above, I reevaluated the patient and found that they have :stayed the same  Social Determinants of Health:  history of bipolar disorder  Disposition: Discharge  Co morbidities that complicate the patient evaluation  Past Medical History:  Diagnosis Date   Anxiety    Bipolar 1 disorder (HCC)    hospitalized  3 x onset rx in 9th grade    Depression    Gestational diabetes    Gonorrhea    Hx of varicella    Syphilis    Twin pregnancy, delivered vaginally, IUFD  stillborns 09/09/2015   IUFD  22 + weeks  still borns      Medicines No orders of the defined types were placed in this encounter.   I have reviewed the patients home medicines and have made adjustments as needed  Problem List / ED Course: Problem List Items Addressed This Visit   None Visit Diagnoses     Pregnancy examination or test, negative result    -  Primary                   Final Clinical Impression(s) / ED Diagnoses Final diagnoses:  Pregnancy examination or test, negative result    Rx / DC Orders ED Discharge Orders  None         Shon Baton, MD 08/16/22 [phone]

## 2022-08-17 ENCOUNTER — Ambulatory Visit (HOSPITAL_COMMUNITY)
Admission: EM | Admit: 2022-08-17 | Discharge: 2022-08-17 | Disposition: A | Discharge status: Home / Self Care | Payer: BC Managed Care – PPO | Attending: Emergency Medicine | Admitting: Emergency Medicine

## 2022-08-17 ENCOUNTER — Encounter (HOSPITAL_COMMUNITY): Payer: Self-pay | Admitting: *Deleted

## 2022-08-17 ENCOUNTER — Ambulatory Visit: Payer: BC Managed Care – PPO | Admitting: Internal Medicine

## 2022-08-17 DIAGNOSIS — R102 Pelvic and perineal pain: Secondary | ICD-10-CM | POA: Diagnosis not present

## 2022-08-17 DIAGNOSIS — N939 Abnormal uterine and vaginal bleeding, unspecified: Secondary | ICD-10-CM | POA: Diagnosis not present

## 2022-08-17 LAB — CBC
HCT: 39.3 % (ref 36.0–46.0)
Hemoglobin: 13.1 g/dL (ref 12.0–15.0)
MCH: 31.2 pg (ref 26.0–34.0)
MCHC: 33.3 g/dL (ref 30.0–36.0)
MCV: 93.6 fL (ref 80.0–100.0)
Platelets: 357 10*3/uL (ref 150–400)
RBC: 4.2 MIL/uL (ref 3.87–5.11)
RDW: 12.8 % (ref 11.5–15.5)
WBC: 9.8 10*3/uL (ref 4.0–10.5)
nRBC: 0 % (ref 0.0–0.2)

## 2022-08-17 LAB — POCT URINALYSIS DIP (MANUAL ENTRY)
Bilirubin, UA: NEGATIVE
Blood, UA: NEGATIVE
Glucose, UA: NEGATIVE mg/dL
Ketones, POC UA: NEGATIVE mg/dL
Leukocytes, UA: NEGATIVE
Nitrite, UA: NEGATIVE
Protein Ur, POC: NEGATIVE mg/dL
Spec Grav, UA: 1.015 (ref 1.010–1.025)
Urobilinogen, UA: 0.2 E.U./dL
pH, UA: 7 (ref 5.0–8.0)

## 2022-08-17 LAB — POCT URINE PREGNANCY: Preg Test, Ur: NEGATIVE

## 2022-08-17 MED ORDER — NAPROXEN 500 MG PO TABS: 500.0000 mg | ORAL_TABLET | Freq: Two times a day (BID) | ORAL | 0 refills | Status: DC

## 2022-08-17 NOTE — ED Triage Notes (Signed)
Pt states she has been having heavy vaginal bleeding since 06/30/2022. She states she has been passing clots. She is having some abdominal pain. She states her abdomin is swollen like she is pregnant.    She want to know if some of the issue is her taking both tablets of Propranolol at the same time.    She states she also has ringing in her ears and needs to see a ENT

## 2022-08-17 NOTE — Telephone Encounter (Addendum)
Called Pt's mother at 4:55 pm last night (08/16/22)  Mother was informed that we have been trying to reach Pt, to no avail.  Mother was also informed that MD suggested Pt reach out &/or FU with Behavioral Health provider.  Mother stated she understood, and figured that would be the case.  Mother reassured me she would inform Pt of MD's advice.  Mother also stated she may have to take Pt in for more behavioral health eval.  Pt's appointment was cancelled, and Mother was told she &/or Pt should feel free to contact us for any other issue Pt may have.

## 2022-08-17 NOTE — Discharge Instructions (Signed)
I will contact you if and only if your CBC comes back abnormal.  We are checking for STDs, BV and yeast as well.  We will treat you based off of those labs.  You are not pregnant today.  You do not have a urinary tract infection.  The Naprosyn will help with pain and decrease the bleeding.  Do not take Naprosyn if you are currently taking lithium.  Naprosyn can increase your lithium levels, making you very sick.  Please follow-up with Dr. Clearance Coots if your symptoms do not resolve within several days.

## 2022-08-17 NOTE — ED Provider Notes (Signed)
HPI  SUBJECTIVE:  Cheryl Cohen is a 32 y.o. female who presents with vaginal bleeding for the past 2 days that became heavy with clots today.  She reports intermittent, seconds long pelvic and back pain described as contraction-like/cramps.  She also reports a white creamy discharge, lightheadedness.  Her last normal period was 4/21-28.  States that she has gone through 5-6 pads today.  States that this is earlier and much heavier than usual.  No abdominal pain, vaginal odor, nausea, vomiting, fevers, urinary complaints.  No syncope, palpitations, chest pain, shortness of breath.  She is in new monogamous relationship with a female, who is asymptomatic.  She states that they had STD testing when they got together, and it was negative.  The car ride over here was not painful.  She has not tried anything for her symptoms.  Symptoms are better with movement, worse with defecation.  It is not associated with p.o. intake.  Patient has a past medical history of bipolar disorder with a recent admission, gonorrhea, chlamydia, syphilis, trichomonas, BV, yeast, gestational diabetes, irregular vaginal bleeding.  No history of HIV, HSV, PID, ectopic pregnancy, coagulopathy.  LMP: 4/21-28.  PCP: [place].  OB/GYN, Dr. Clearance Coots at [facility].  She was seen in the emergency department yesterday with breast tenderness, abdominal fullness, sensation of fetal movement, vaginal spotting.  She had a negative urine pregnancy test. UA was negative for UTI.  She declined a pelvic exam at that time  Patient states that she is not taking lithium currently.  Past Medical History:  Diagnosis Date   Anxiety    Bipolar 1 disorder (HCC)    hospitalized  3 x onset rx in 9th grade    Depression    Gestational diabetes    Gonorrhea    Hx of varicella    Syphilis    Twin pregnancy, delivered vaginally, IUFD stillborns 09/09/2015   IUFD  22 + weeks  still borns     Past Surgical History:  Procedure Laterality Date    DILATION AND EVACUATION N/A 09/09/2015   Procedure: DILATATION AND EVACUATION;  Surgeon: [name], MD;  Location: [facility];  Service: Gynecology;  Laterality: N/A;   TYMPANOSTOMY TUBE PLACEMENT     asage 2-3 yrs    Family History  Problem Relation Age of Onset   Diabetes Mother    Arthritis Mother    Hypertension Father    Alcohol abuse Father    Alcoholism Father     Social History   Tobacco Use   Smoking status: Every Day    Types: Cigars   Smokeless tobacco: Former   Tobacco comments:    1 cigarette a day  Vaping Use   Vaping Use: Never used  Substance Use Topics   Alcohol use: Yes   Drug use: Yes    Types: Marijuana    No current facility-administered medications for this encounter.  Current Outpatient Medications:    ARIPiprazole (ABILIFY) 30 MG tablet, Take 30 mg by mouth at bedtime., Disp: , Rfl:    ARISTADA 662 MG/2.4ML prefilled syringe, SMARTSIG:1 Unspecified IM As Directed, Disp: , Rfl:    naproxen (NAPROSYN) 500 MG tablet, Take 1 tablet (500 mg total) by mouth 2 (two) times daily., Disp: 20 tablet, Rfl: 0   propranolol (INDERAL) 10 MG tablet, Take 1 tablet (10 mg total) by mouth 3 (three) times daily. (Patient taking differently: Take 10 mg by mouth 2 (two) times daily.), Disp: 90 tablet, Rfl: 0   lithium carbonate (  ESKALITH) 450 MG ER tablet, Take 1 tablet (450 mg total) by mouth at bedtime., Disp: 30 tablet, Rfl: 0   OLANZapine (ZYPREXA) 5 MG tablet, Take 5 mg by mouth at bedtime., Disp: , Rfl:    risperiDONE (RISPERDAL) 1 MG tablet, Take 1 tablet (1 mg total) by mouth 2 (two) times daily., Disp: 60 tablet, Rfl: 0  Allergies  Allergen Reactions   Amoxicillin Rash and Other (See Comments)    Has patient had a PCN reaction causing immediate rash, facial/tongue/throat swelling, SOB or lightheadedness with hypotension: yes Has patient had a PCN reaction causing severe rash involving mucus membranes or skin necrosis: No Has patient had a PCN reaction that  required hospitalization: No Has patient had a PCN reaction occurring within the last 10 years: Yes If all of the above answers are "NO", then may proceed with Cephalosporin use.      ROS  As noted in HPI.   Physical Exam  BP 112/75 (BP Location: Left Arm)   Pulse 75   Temp 98.3 F (36.8 C) (Oral)   Resp 18   LMP 07/23/2022 Comment: full cycle 06/30/22, spotting 07/23/22  SpO2 98%   Constitutional: Well developed, well nourished, no acute distress Eyes:  EOMI, conjunctiva normal bilaterally HENT: Normocephalic, atraumatic,mucus membranes moist Respiratory: Normal inspiratory effort Cardiovascular: Normal rate GI: nondistended soft, nontender, no guarding, rebound.  No lower quadrant, suprapubic tenderness.  No flank tenderness. Neck: No CVA tenderness GU: Normal external genitalia.  Positive clots in the vaginal vault and brownish vaginal bleeding.  No bright red, brisk bleeding from os.  Cervix normal.  No other appreciable discharge.  Uterus smooth, nontender.  No CMT.  No adnexal tenderness, masses.  Chaperone present during exam. skin: No rash, skin intact Musculoskeletal: no deformities Neurologic: Alert & oriented x 3, no focal neuro deficits Psychiatric: Speech and behavior appropriate   ED Course   Medications - No data to display  Orders Placed This Encounter  Procedures   CBC    Standing Status:   Standing    Number of Occurrences:   1   POC urinalysis dipstick    Standing Status:   Standing    Number of Occurrences:   1   POCT urine pregnancy    Standing Status:   Standing    Number of Occurrences:   1    Results for orders placed or performed during the hospital encounter of 08/17/22 (from the past 24 hour(s))  POCT urine pregnancy     Status: None   Collection Time: 08/17/22  7:01 PM  Result Value Ref Range   Preg Test, Ur Negative Negative  POC urinalysis dipstick     Status: Abnormal   Collection Time: 08/17/22  7:03 PM  Result Value Ref Range    Color, UA light yellow (A) yellow   Clarity, UA clear clear   Glucose, UA negative negative mg/dL   Bilirubin, UA negative negative   Ketones, POC UA negative negative mg/dL   Spec Grav, UA 1.610 9.604 - 1.025   Blood, UA negative negative   pH, UA 7.0 5.0 - 8.0   Protein Ur, POC negative negative mg/dL   Urobilinogen, UA 0.2 0.2 or 1.0 E.U./dL   Nitrite, UA Negative Negative   Leukocytes, UA Negative Negative  CBC     Status: None   Collection Time: 08/17/22  7:19 PM  Result Value Ref Range   WBC 9.8 4.0 - 10.5 K/uL   RBC 4.20 3.87 - 5.11  MIL/uL   Hemoglobin 13.1 12.0 - 15.0 g/dL   HCT 46.9 62.9 - 52.8 %   MCV 93.6 80.0 - 100.0 fL   MCH 31.2 26.0 - 34.0 pg   MCHC 33.3 30.0 - 36.0 g/dL   RDW 41.3 24.4 - 01.0 %   Platelets 357 150 - 400 K/uL   nRBC 0.0 0.0 - 0.2 %   No results found.  ED Clinical Impression  1. Abnormal vaginal bleeding   2. Pelvic pain      ED Assessment/Plan     ER records reviewed from yesterday.  As noted in HPI.  Patient presents with pelvic pain and abnormal vaginal bleeding.  Her urine pregnancy is negative, this is the second negative pregnancy test that she has had in the past 24 hours.  Her UA is negative for UTI.  Will check a CBC, swab for STDs, BV and yeast.  She would like to wait for her labs for further treatment, feel that this is reasonable given that she has had negative recent STD testing with her current partner.  No evidence of PID, no peritoneal signs.  Doubt TOA, ovarian torsion.  Will send home with Naprosyn 500 mg twice daily for vaginal bleeding and for pain.  She is to follow-up with her OB/GYN Dr. Clearance Coots at [facility] if symptoms persist.  ER return precautions given.  [phone]  Patient states that she is not taking lithium.  Discussed with her that NSAIDs and lithium can increase lithium levels, leading to toxicity.  She again denies taking lithium currently.  CBC normal.  Vaginal swab pending at the time of the  signing of this note.  Plan as above.  Discussed labs, MDM, treatment plan, and plan for follow-up with patient. Discussed sn/sx that should prompt return to the ED. patient agrees with plan.   Meds ordered this encounter  Medications   naproxen (NAPROSYN) 500 MG tablet    Sig: Take 1 tablet (500 mg total) by mouth 2 (two) times daily.    Dispense:  20 tablet    Refill:  0      *This clinic note was created using Scientist, clinical (histocompatibility and immunogenetics). Therefore, there may be occasional mistakes despite careful proofreading.  ?    Domenick Gong, MD 08/18/22 [phone]

## 2022-08-18 LAB — CERVICOVAGINAL ANCILLARY ONLY
Bacterial Vaginitis (gardnerella): POSITIVE — AB
Candida Glabrata: NEGATIVE
Candida Vaginitis: NEGATIVE
Chlamydia: NEGATIVE
Comment: NEGATIVE
Comment: NEGATIVE
Comment: NEGATIVE
Comment: NEGATIVE
Comment: NEGATIVE
Comment: NORMAL
Neisseria Gonorrhea: NEGATIVE
Trichomonas: NEGATIVE

## 2022-08-21 ENCOUNTER — Telehealth: Payer: Self-pay

## 2022-08-21 MED ORDER — METRONIDAZOLE 500 MG PO TABS
500.0000 mg | ORAL_TABLET | Freq: Two times a day (BID) | ORAL | 0 refills | Status: DC
Start: 2022-08-21 — End: 2022-09-11

## 2022-08-23 ENCOUNTER — Encounter: Payer: Self-pay | Admitting: Obstetrics

## 2022-08-23 ENCOUNTER — Ambulatory Visit: Payer: BC Managed Care – PPO

## 2022-08-23 ENCOUNTER — Ambulatory Visit (INDEPENDENT_AMBULATORY_CARE_PROVIDER_SITE_OTHER): Payer: BC Managed Care – PPO | Admitting: Obstetrics

## 2022-08-23 VITALS — BP 102/69 | HR 63 | Ht 64.0 in | Wt 143.8 lb

## 2022-08-23 DIAGNOSIS — N939 Abnormal uterine and vaginal bleeding, unspecified: Secondary | ICD-10-CM | POA: Diagnosis not present

## 2022-08-23 DIAGNOSIS — R102 Pelvic and perineal pain: Secondary | ICD-10-CM | POA: Diagnosis not present

## 2022-08-23 DIAGNOSIS — Z113 Encounter for screening for infections with a predominantly sexual mode of transmission: Secondary | ICD-10-CM

## 2022-08-23 DIAGNOSIS — R140 Abdominal distension (gaseous): Secondary | ICD-10-CM

## 2022-08-23 DIAGNOSIS — F3163 Bipolar disorder, current episode mixed, severe, without psychotic features: Secondary | ICD-10-CM

## 2022-08-23 NOTE — Progress Notes (Signed)
Patient ID: Cheryl Cohen, female   DOB: 20-May-1990, 32 y.o.   MRN: [id number]  Chief Complaint  Patient presents with   Menstrual Problem    HPI Cheryl Cohen is a 32 y.o. female.  History of AUB and Pelvic Pain a week ago, that has resolved.  Complains of bloating today.   HPI  Past Medical History:  Diagnosis Date   Anxiety    Bipolar 1 disorder (HCC)    hospitalized  3 x onset rx in 9th grade    Depression    Gestational diabetes    Gonorrhea    Hx of varicella    Syphilis    Twin pregnancy, delivered vaginally, IUFD stillborns 09/09/2015   IUFD  22 + weeks  still borns     Past Surgical History:  Procedure Laterality Date   DILATION AND EVACUATION N/A 09/09/2015   Procedure: DILATATION AND EVACUATION;  Surgeon: [name], MD;  Location: [facility];  Service: Gynecology;  Laterality: N/A;   TYMPANOSTOMY TUBE PLACEMENT     asage 2-3 yrs    Family History  Problem Relation Age of Onset   Diabetes Mother    Arthritis Mother    Hypertension Father    Alcohol abuse Father    Alcoholism Father     Social History Social History   Tobacco Use   Smoking status: Every Day    Types: Cigars   Smokeless tobacco: Former   Tobacco comments:    1 cigarette a day  Vaping Use   Vaping Use: Never used  Substance Use Topics   Alcohol use: Yes   Drug use: Yes    Types: Marijuana    Allergies  Allergen Reactions   Amoxicillin Rash and Other (See Comments)    Has patient had a PCN reaction causing immediate rash, facial/tongue/throat swelling, SOB or lightheadedness with hypotension: yes Has patient had a PCN reaction causing severe rash involving mucus membranes or skin necrosis: No Has patient had a PCN reaction that required hospitalization: No Has patient had a PCN reaction occurring within the last 10 years: Yes If all of the above answers are "NO", then may proceed with Cephalosporin use.     Current Outpatient Medications  Medication Sig Dispense  Refill   ARIPiprazole (ABILIFY) 30 MG tablet Take 30 mg by mouth at bedtime.     ARISTADA 662 MG/2.4ML prefilled syringe SMARTSIG:1 Unspecified IM As Directed     metroNIDAZOLE (FLAGYL) 500 MG tablet Take 1 tablet (500 mg total) by mouth 2 (two) times daily. 14 tablet 0   naproxen (NAPROSYN) 500 MG tablet Take 1 tablet (500 mg total) by mouth 2 (two) times daily. 20 tablet 0   propranolol (INDERAL) 10 MG tablet Take 1 tablet (10 mg total) by mouth 3 (three) times daily. (Patient taking differently: Take 10 mg by mouth 2 (two) times daily.) 90 tablet 0   lithium carbonate (ESKALITH) 450 MG ER tablet Take 1 tablet (450 mg total) by mouth at bedtime. (Patient not taking: Reported on 08/23/2022) 30 tablet 0   OLANZapine (ZYPREXA) 5 MG tablet Take 5 mg by mouth at bedtime. (Patient not taking: Reported on 08/23/2022)     risperiDONE (RISPERDAL) 1 MG tablet Take 1 tablet (1 mg total) by mouth 2 (two) times daily. (Patient not taking: Reported on 08/23/2022) 60 tablet 0   No current facility-administered medications for this visit.    Review of Systems Review of Systems Constitutional: negative for fatigue and weight loss Respiratory: negative  for cough and wheezing Cardiovascular: negative for chest pain, fatigue and palpitations Gastrointestinal:  positive for bloating.  negative for abdominal pain and change in bowel habits Genitourinary: positive for AUB and pelvic pain Integument/breast: negative for nipple discharge Musculoskeletal:negative for myalgias Neurological: negative for gait problems and tremors Behavioral/Psych: negative for abusive relationship, depression Endocrine: negative for temperature intolerance      Blood pressure 102/69, pulse 63, height 5\' 4"  (1.626 m), weight 143 lb 12.8 oz (65.2 kg), last menstrual period 08/15/2022, unknown if currently breastfeeding.  Physical Exam Physical Exam General:   Alert and no distress  Skin:   no rash or abnormalities  Lungs:   clear  to auscultation bilaterally  Heart:   regular rate and rhythm, S1, S2 normal, no murmur, click, rub or gallop  Breasts:   normal without suspicious masses, skin or nipple changes or axillary nodes  Abdomen:  normal findings: no organomegaly, soft, non-tender and no hernia  Pelvis:  External genitalia: normal general appearance Urinary system: urethral meatus normal and bladder without fullness, nontender Vaginal: normal without tenderness, induration or masses Cervix: normal appearance Adnexa: normal bimanual exam Uterus: anteverted and non-tender, normal size    I have spent a total of 20 minutes of face-to-face time, excluding clinical staff time, reviewing notes and preparing to see patient, ordering tests and/or medications, and counseling the patient.   Data Reviewed UPT Wet Prep  Assessment     1. Abnormal uterine bleeding (AUB) Rx: - US PELVIC COMPLETE WITH TRANSVAGINAL; Future - hCG, serum, qualitative  2. Pelvic pain Rx: - US PELVIC COMPLETE WITH TRANSVAGINAL; Future  3. Screen for STD (sexually transmitted disease) Rx: - HIV Antibody (routine testing w rflx) - Hepatitis B surface antigen - RPR - Hepatitis C antibody  4. Abdominal bloating  5. Bipolar affective disorder, mixed, severe (HCC)      Plan   Follow up in 2 weeks  Orders Placed This Encounter  Procedures   US PELVIC COMPLETE WITH TRANSVAGINAL    Standing Status:   Future    Standing Expiration Date:   08/23/2023    Order Specific Question:   Reason for Exam (SYMPTOM  OR DIAGNOSIS REQUIRED)    Answer:   AUB.  Pelvic pain.    Order Specific Question:   Preferred imaging location?    Answer:   [facility] Ultrasound   hCG, serum, qualitative   HIV Antibody (routine testing w rflx)   Hepatitis B surface antigen   RPR   Hepatitis C antibody     Brock Bad, MD 08/23/2022 12:20 PM

## 2022-08-23 NOTE — Progress Notes (Signed)
Pt presents to discuss irregular periods since February. Also endorse pregnancy symptoms of vomiting, abdominal fullness and fetal movement. Desires pregnancy.

## 2022-08-24 LAB — RPR: RPR Ser Ql: NONREACTIVE

## 2022-08-24 LAB — HEPATITIS B SURFACE ANTIGEN: Hepatitis B Surface Ag: NEGATIVE

## 2022-08-24 LAB — HCG, SERUM, QUALITATIVE: hCG,Beta Subunit,Qual,Serum: NEGATIVE m[IU]/mL (ref <6)

## 2022-08-24 LAB — HEPATITIS C ANTIBODY: Hep C Virus Ab: NONREACTIVE

## 2022-08-24 LAB — HIV ANTIBODY (ROUTINE TESTING W REFLEX): HIV Screen 4th Generation wRfx: NONREACTIVE

## 2022-08-29 ENCOUNTER — Ambulatory Visit (HOSPITAL_COMMUNITY)
Admission: RE | Admit: 2022-08-29 | Discharge: 2022-08-29 | Disposition: A | Discharge status: Home / Self Care | Payer: BC Managed Care – PPO | Source: Ambulatory Visit | Attending: Obstetrics | Admitting: Obstetrics

## 2022-08-29 DIAGNOSIS — N939 Abnormal uterine and vaginal bleeding, unspecified: Secondary | ICD-10-CM | POA: Insufficient documentation

## 2022-08-29 DIAGNOSIS — R102 Pelvic and perineal pain: Secondary | ICD-10-CM | POA: Diagnosis not present

## 2022-09-04 DIAGNOSIS — F310 Bipolar disorder, current episode hypomanic: Secondary | ICD-10-CM | POA: Diagnosis not present

## 2022-09-04 DIAGNOSIS — F909 Attention-deficit hyperactivity disorder, unspecified type: Secondary | ICD-10-CM | POA: Diagnosis not present

## 2022-09-10 ENCOUNTER — Ambulatory Visit (HOSPITAL_COMMUNITY)
Admission: EM | Admit: 2022-09-10 | Discharge: 2022-09-11 | Disposition: A | Discharge status: Other Acute Inpatient Hospital | Payer: BC Managed Care – PPO | Attending: Family Medicine | Admitting: Family Medicine

## 2022-09-10 DIAGNOSIS — F1220 Cannabis dependence, uncomplicated: Secondary | ICD-10-CM | POA: Insufficient documentation

## 2022-09-10 DIAGNOSIS — F312 Bipolar disorder, current episode manic severe with psychotic features: Secondary | ICD-10-CM | POA: Diagnosis not present

## 2022-09-10 DIAGNOSIS — F3164 Bipolar disorder, current episode mixed, severe, with psychotic features: Secondary | ICD-10-CM | POA: Diagnosis present

## 2022-09-10 LAB — COMPREHENSIVE METABOLIC PANEL
ALT: 21 U/L (ref 0–44)
AST: 28 U/L (ref 15–41)
Albumin: 3.7 g/dL (ref 3.5–5.0)
Alkaline Phosphatase: 46 U/L (ref 38–126)
Anion gap: 10 (ref 5–15)
BUN: 10 mg/dL (ref 6–20)
CO2: 25 mmol/L (ref 22–32)
Calcium: 9.3 mg/dL (ref 8.9–10.3)
Chloride: 104 mmol/L (ref 98–111)
Creatinine, Ser: 0.95 mg/dL (ref 0.44–1.00)
GFR, Estimated: >60 mL/min (ref >60)
Glucose, Bld: 92 mg/dL (ref 70–99)
Potassium: 3.8 mmol/L (ref 3.5–5.1)
Sodium: 139 mmol/L (ref 135–145)
Total Bilirubin: 0.7 mg/dL (ref 0.3–1.2)
Total Protein: 6.9 g/dL (ref 6.5–8.1)

## 2022-09-10 LAB — CBC WITH DIFFERENTIAL/PLATELET
Abs Immature Granulocytes: 0.01 10*3/uL (ref 0.00–0.07)
Basophils Absolute: 0.1 10*3/uL (ref 0.0–0.1)
Basophils Relative: 1 %
Eosinophils Absolute: 0.2 10*3/uL (ref 0.0–0.5)
Eosinophils Relative: 3 %
HCT: 40.1 % (ref 36.0–46.0)
Hemoglobin: 13.4 g/dL (ref 12.0–15.0)
Immature Granulocytes: 0 %
Lymphocytes Relative: 33 %
Lymphs Abs: 2.4 10*3/uL (ref 0.7–4.0)
MCH: 31.8 pg (ref 26.0–34.0)
MCHC: 33.4 g/dL (ref 30.0–36.0)
MCV: 95 fL (ref 80.0–100.0)
Monocytes Absolute: 1 10*3/uL (ref 0.1–1.0)
Monocytes Relative: 14 %
Neutro Abs: 3.5 10*3/uL (ref 1.7–7.7)
Neutrophils Relative %: 49 %
Platelets: 289 10*3/uL (ref 150–400)
RBC: 4.22 MIL/uL (ref 3.87–5.11)
RDW: 12.7 % (ref 11.5–15.5)
WBC: 7.1 10*3/uL (ref 4.0–10.5)
nRBC: 0 % (ref 0.0–0.2)

## 2022-09-10 LAB — HCG, QUANTITATIVE, PREGNANCY: hCG, Beta Chain, Quant, S: <1 m[IU]/mL (ref <5)

## 2022-09-10 LAB — HEMOGLOBIN A1C
Hgb A1c MFr Bld: 5.2 % (ref 4.8–5.6)
Mean Plasma Glucose: 102.54 mg/dL

## 2022-09-10 LAB — ETHANOL: Alcohol, Ethyl (B): <10 mg/dL (ref <10)

## 2022-09-10 LAB — LITHIUM LEVEL: Lithium Lvl: <0.06 mmol/L — ABNORMAL LOW (ref 0.60–1.20)

## 2022-09-10 MED ORDER — ALUM & MAG HYDROXIDE-SIMETH 200-200-20 MG/5ML PO SUSP: 30.0000 mL | ORAL | Status: DC | PRN

## 2022-09-10 MED ORDER — OLANZAPINE 10 MG PO TABS
10.0000 mg | ORAL_TABLET | Freq: Every day | ORAL | Status: DC | PRN
  Administered 2022-09-10: 10 mg via ORAL
  Filled 2022-09-10: qty 1

## 2022-09-10 MED ORDER — TRAZODONE HCL 50 MG PO TABS
50.0000 mg | ORAL_TABLET | Freq: Every evening | ORAL | Status: DC | PRN
  Administered 2022-09-10: 50 mg via ORAL
  Filled 2022-09-10: qty 1

## 2022-09-10 MED ORDER — ARIPIPRAZOLE 15 MG PO TABS
30.0000 mg | ORAL_TABLET | Freq: Every day | ORAL | Status: DC
  Administered 2022-09-10: 30 mg via ORAL
  Filled 2022-09-10: qty 2

## 2022-09-10 MED ORDER — MAGNESIUM HYDROXIDE 400 MG/5ML PO SUSP: 30.0000 mL | Freq: Every day | ORAL | Status: DC | PRN

## 2022-09-10 MED ORDER — HYDROXYZINE HCL 25 MG PO TABS
25.0000 mg | ORAL_TABLET | Freq: Three times a day (TID) | ORAL | Status: DC | PRN
  Administered 2022-09-10: 25 mg via ORAL
  Filled 2022-09-10: qty 1

## 2022-09-10 MED ORDER — LORAZEPAM 1 MG PO TABS: 2.0000 mg | ORAL_TABLET | Freq: Three times a day (TID) | ORAL | Status: DC | PRN

## 2022-09-10 MED ORDER — LITHIUM CARBONATE ER 450 MG PO TBCR
450.0000 mg | EXTENDED_RELEASE_TABLET | Freq: Every day | ORAL | Status: DC
  Administered 2022-09-10: 450 mg via ORAL
  Filled 2022-09-10: qty 1

## 2022-09-10 MED ORDER — ACETAMINOPHEN 325 MG PO TABS: 650.0000 mg | ORAL_TABLET | Freq: Four times a day (QID) | ORAL | Status: DC | PRN

## 2022-09-10 MED ORDER — PROPRANOLOL HCL 10 MG PO TABS
10.0000 mg | ORAL_TABLET | Freq: Two times a day (BID) | ORAL | Status: DC
  Administered 2022-09-10 – 2022-09-11 (×2): 10 mg via ORAL
  Filled 2022-09-10 (×3): qty 1

## 2022-09-10 NOTE — ED Notes (Signed)
Pt a/o x4. Denies SI/HI/AVH. States she has had many inpt admits. She states she does take her meds daily and that she is here because of her family. No noted distress at present time. Will continue to monitor for safety

## 2022-09-10 NOTE — ED Notes (Signed)
Pt admitted to obs .Denies SI/HI/AVH. Calm, cooperative throughout interview process. Skin assessment completed. Oriented to unit. Meal and drink offered. At currrent, pt continue to denies  SI/HI/AVH. Pt verbally contract for safety. Will monitor for safety.

## 2022-09-10 NOTE — ED Notes (Signed)
Pt in bed laughing to self, no one present with pt. Breathing is even and unlabored.  No distress noted or voiced.  Will continue to monitor for safety.
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
## 2022-09-10 NOTE — ED Notes (Signed)
Pt pacing on unit, talking. Appears to be RIS. Attempted to provide PRN meds. Pt refuses to take the meds. Will continue to monitor for safety

## 2022-09-10 NOTE — ED Notes (Signed)
[facility] lab courier called to transport labs to [facility] lab 

## 2022-09-10 NOTE — ED Notes (Signed)
Patient in milieu. Environment is secured. Will continue to monitor for safety. 

## 2022-09-10 NOTE — ED Provider Notes (Cosign Needed Addendum)
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   Immature Granulocytes 07/17/2022 0  % Final   Abs Immature Granulocytes 07/17/2022 0.02  0.00 - 0.07 K/uL Final   Performed at [facility], [address], [place], [place] [address]   I-stat hCG, quantitative 07/17/2022 <5.0  <5 mIU/mL Final   Comment 3 07/17/2022          Final   Comment:   GEST. AGE      CONC.  (mIU/mL)   <=1 WEEK        5 - 50     2 WEEKS       50 - 500     3 WEEKS        100 - 10,000     4 WEEKS     1,000 - 30,000        FEMALE AND NON-PREGNANT FEMALE:     LESS THAN 5 mIU/mL    Salicylate Lvl 07/17/2022 <7.0 (L)  7.0 - 30.0 mg/dL Final   Performed at [facility], [address] [place], [place] [address]   Acetaminophen (Tylenol), Serum 07/17/2022 <10 (L)  10 - 30 ug/mL Final   Comment: (NOTE) Therapeutic concentrations vary significantly. A range of 10-30 ug/mL  may be an effective concentration for many patients. However, some  are best treated at concentrations outside of this range. Acetaminophen concentrations >150 ug/mL at 4 hours after ingestion  and >50 ug/mL at 12 hours after ingestion are often associated with  toxic reactions.  Performed at [facility], [address], [place], [place] [address]    Lithium Lvl 07/17/2022 0.07 (L)  0.60 - 1.20 mmol/L Final   Performed at [facility], [address] [place], [place] [address]   hCG, Conley Rolls, Quant, Kathie Rhodes 07/17/2022 <1  <5 mIU/mL Final   Comment:          GEST. AGE      CONC.  (mIU/mL)   <=1 WEEK        5 - 50     2 WEEKS       50 - 500     3 WEEKS       100 - 10,000     4 WEEKS     1,000 - 30,000     5 WEEKS     3,500 - 115,000   6-8 WEEKS     12,000 - 270,000    12 WEEKS     15,000 - 220,000        FEMALE AND NON-PREGNANT FEMALE:     LESS THAN 5 mIU/mL Performed at [facility], [address] [place], [place] [address]   Admission on 07/13/2022, Discharged on 07/13/2022  Component Date Value Ref Range Status   Preg Test, Ur 07/13/2022 NEGATIVE  NEGATIVE Final   Comment:        THE SENSITIVITY OF THIS METHODOLOGY IS >24 mIU/mL    Glucose, UA 07/13/2022 NEGATIVE  NEGATIVE mg/dL Final   Bilirubin Urine 07/13/2022 NEGATIVE  NEGATIVE Final   Ketones, ur 07/13/2022 TRACE (A)  NEGATIVE mg/dL Final   Specific Gravity, Urine 07/13/2022 1.025  1.005 - 1.030 Final   Hgb urine dipstick 07/13/2022 NEGATIVE   NEGATIVE  Final   pH 07/13/2022 6.0  5.0 - 8.0 Final   Protein, ur 07/13/2022 NEGATIVE  NEGATIVE mg/dL Final   Urobilinogen, UA 07/13/2022 0.2  0.0 - 1.0 mg/dL Final   Nitrite 09/81/1914 NEGATIVE  NEGATIVE Final   Leukocytes,Ua 07/13/2022 NEGATIVE  NEGATIVE Final   Biochemical Testing Only. Please order routine urinalysis from main lab if confirmatory testing is needed.   hCG, Beta Chain, Quant, S 07/13/2022 <1  <5 mIU/mL Final   Comment:          GEST. AGE      CONC.  (mIU/mL)   <=1 WEEK        5 - 50     2 WEEKS       50 - 500     3 WEEKS       100 - 10,000     4 WEEKS     1,000 - 30,000     5 WEEKS     3,500 - 115,000   6-8 WEEKS     12,000 - 270,000    12 WEEKS     15,000 - 220,000        FEMALE AND NON-PREGNANT FEMALE:     LESS THAN 5 mIU/mL Performed at [facility], [address], [place], [place] [address]   Office Visit on 07/06/2022  Component Date Value Ref Range Status   TSH 07/06/2022 0.81  0.35 - 5.50 uIU/mL Final   Free T4 07/06/2022 1.17  0.60 - 1.60 ng/dL Final   Comment: Specimens from patients who are undergoing biotin therapy and /or ingesting biotin supplements may contain high levels of biotin.  The higher biotin concentration in these specimens interferes with this Free T4 assay.  Specimens that contain high levels  of biotin may cause false high results for this Free T4 assay.  Please interpret results in light of the total clinical presentation of the patient.     Sodium 07/06/2022 139  135 - 145 mEq/L Final   Potassium 07/06/2022 4.0  3.5 - 5.1 mEq/L Final   Chloride 07/06/2022 103  96 - 112 mEq/L Final   CO2 07/06/2022 29  19 - 32 mEq/L Final   Glucose, Bld 07/06/2022 96  70 - 99 mg/dL Final   BUN 62/13/0865 10  6 - 23 mg/dL Final   Creatinine, Ser 07/06/2022 0.80  0.40 - 1.20 mg/dL Final   Total Bilirubin 07/06/2022 0.7  0.2 - 1.2 mg/dL Final   Alkaline Phosphatase 07/06/2022 51  39 - 117 U/L Final   AST 07/06/2022 15  0 - 37 U/L Final   ALT  07/06/2022 11  0 - 35 U/L Final   Total Protein 07/06/2022 7.9  6.0 - 8.3 g/dL Final   Albumin 78/46/9629 4.8  3.5 - 5.2 g/dL Final   GFR 52/84/1324 98.96  >60.00 mL/min Final   Calculated using the CKD-EPI Creatinine Equation (2021)   Calcium 07/06/2022 10.1  8.4 - 10.5 mg/dL Final   WBC 40/01/2724 7.7  4.0 - 10.5 K/uL Final   RBC 07/06/2022 4.78  3.87 - 5.11 Mil/uL Final   Hemoglobin 07/06/2022 15.1 (H)  12.0 - 15.0 g/dL Final   HCT 36/64/4034 43.9  36.0 - 46.0 % Final   MCV 07/06/2022 91.9  78.0 - 100.0 fl Final   MCHC 07/06/2022 34.4  30.0 - 36.0 g/dL Final   RDW 74/25/9563 13.4  11.5 - 15.5 % Final   Platelets 07/06/2022 314.0  150.0 - 400.0 K/uL Final  Neutrophils Relative % 07/06/2022 47.8  43.0 - 77.0 % Final   Lymphocytes Relative 07/06/2022 38.3  12.0 - 46.0 % Final   Monocytes Relative 07/06/2022 11.8  3.0 - 12.0 % Final   Eosinophils Relative 07/06/2022 1.8  0.0 - 5.0 % Final   Basophils Relative 07/06/2022 0.3  0.0 - 3.0 % Final   Neutro Abs 07/06/2022 3.7  1.4 - 7.7 K/uL Final   Lymphs Abs 07/06/2022 3.0  0.7 - 4.0 K/uL Final   Monocytes Absolute 07/06/2022 0.9  0.1 - 1.0 K/uL Final   Eosinophils Absolute 07/06/2022 0.1  0.0 - 0.7 K/uL Final   Basophils Absolute 07/06/2022 0.0  0.0 - 0.1 K/uL Final   HIV 1&2 Ab, 4th Generation 07/06/2022 NON-REACTIVE  NON-REACTIVE Final   Comment: HIV-1 antigen and HIV-1/HIV-2 antibodies were not detected. There is no laboratory evidence of HIV infection. Marland Kitchen PLEASE NOTE: This information has been disclosed to you from records whose confidentiality may be protected by state law.  If your state requires such protection, then the state law prohibits you from making any further disclosure of the information without the specific written consent of the person to whom it pertains, or as otherwise permitted by law. A general authorization for the release of medical or other information is NOT sufficient for this purpose. . For additional  information please refer to http://education.[employer].com/faq/FAQ106 (This link is being provided for informational/ educational purposes only.) . Marland Kitchen The performance of this assay has not been clinically validated in patients less than 63 years old. .   Office Visit on 05/24/2022  Component Date Value Ref Range Status   High risk HPV 05/24/2022 Negative   Final   Adequacy 05/24/2022 Satisfactory for evaluation; transformation zone component PRESENT.   Final   Diagnosis 05/24/2022 - Negative for intraepithelial lesion or malignancy (NILM)   Final   Microorganisms 05/24/2022 Shift in flora suggestive of bacterial vaginosis   Final   Comment 05/24/2022 Normal Reference Range HPV - Negative   Final   Neisseria Gonorrhea 05/24/2022 Negative   Final   Chlamydia 05/24/2022 Negative   Final   Trichomonas 05/24/2022 Negative   Final   Bacterial Vaginitis (gardnerella) 05/24/2022 Positive (A)   Final   Candida Vaginitis 05/24/2022 Negative   Final   Candida Glabrata 05/24/2022 Negative   Final   Comment 05/24/2022 Normal Reference Range Bacterial Vaginosis - Negative   Final   Comment 05/24/2022 Normal Reference Range Candida Species - Negative   Final   Comment 05/24/2022 Normal Reference Range Candida Galbrata - Negative   Final   Comment 05/24/2022 Normal Reference Range Trichomonas - Negative   Final   Comment 05/24/2022 Normal Reference Ranger Chlamydia - Negative   Final   Comment 05/24/2022 Normal Reference Range Neisseria Gonorrhea - Negative   Final   Hep C Virus Ab 05/24/2022 Non Reactive  Non Reactive Final   Comment: HCV antibody alone does not differentiate between previously resolved infection and active infection. Equivocal and Reactive HCV antibody results should be followed up with an HCV RNA test to support the diagnosis of active HCV infection.   There may be more visits with results that are not included.    Allergies: Amoxicillin  Medications:  Facility  Ordered Medications  Medication   acetaminophen (TYLENOL) tablet 650 mg   alum & mag hydroxide-simeth (MAALOX/MYLANTA) 200-200-20 MG/5ML suspension 30 mL   magnesium hydroxide (MILK OF MAGNESIA) suspension 30 mL   hydrOXYzine (ATARAX) tablet 25 mg   traZODone (DESYREL) tablet  50 mg   LORazepam (ATIVAN) tablet 2 mg   lithium carbonate (ESKALITH) ER tablet 450 mg   OLANZapine (ZYPREXA) tablet 10 mg   propranolol (INDERAL) tablet 10 mg   ARIPiprazole (ABILIFY) tablet 30 mg   PTA Medications  Medication Sig   lithium carbonate (ESKALITH) 450 MG ER tablet Take 1 tablet (450 mg total) by mouth at bedtime. (Patient not taking: Reported on 08/23/2022)   propranolol (INDERAL) 10 MG tablet Take 1 tablet (10 mg total) by mouth 3 (three) times daily. (Patient taking differently: Take 10 mg by mouth 2 (two) times daily.)   risperiDONE (RISPERDAL) 1 MG tablet Take 1 tablet (1 mg total) by mouth 2 (two) times daily. (Patient not taking: Reported on 08/23/2022)   OLANZapine (ZYPREXA) 5 MG tablet Take 5 mg by mouth at bedtime. (Patient not taking: Reported on 08/23/2022)   ARISTADA 662 MG/2.4ML prefilled syringe SMARTSIG:1 Unspecified IM As Directed   ARIPiprazole (ABILIFY) 30 MG tablet Take 30 mg by mouth at bedtime.   naproxen (NAPROSYN) 500 MG tablet Take 1 tablet (500 mg total) by mouth 2 (two) times daily.   metroNIDAZOLE (FLAGYL) 500 MG tablet Take 1 tablet (500 mg total) by mouth 2 (two) times daily.    Medical Decision Making  Recommend overnight observation and re-evaluate tomorrow. TTS provider has attempted to contact mother to obtain collateral, unable to reach her via phone. TTS will continue to make efforts to secure collateral from mother. Restarted medications per patient report. She was able to provide this Clinical research associate with medications has been discharge home prescribed from her previous recent inpatient admission at [facility]. Lithium level remains subtherapeutic indicating patient is not  consistently taking medication. Ativan PRN ordered for agitation.  Recommendations  Based on my evaluation the patient does not appear to have an emergency medical condition. Observe overnight. Upheld IVC as patient appears to be psychotic. Psychiatry to re-evaluate tomorrow morning.  Joaquin Courts, FNP-C, PMHNP-C  09/10/22  2:59 PM

## 2022-09-10 NOTE — ED Notes (Signed)
Pt talking, singing to self while pacing on unit. Encouraged pt to lay down to sleep. She did sit in chair by recliner. Will continue to monitor for safety

## 2022-09-11 ENCOUNTER — Other Ambulatory Visit: Payer: Self-pay

## 2022-09-11 ENCOUNTER — Inpatient Hospital Stay (HOSPITAL_COMMUNITY)
Admission: AD | Admit: 2022-09-11 | Discharge: 2022-09-17 | DRG: 885 | Disposition: A | Discharge status: Home / Self Care | Payer: BC Managed Care – PPO | Source: Intra-hospital | Attending: Psychiatry | Admitting: Psychiatry

## 2022-09-11 ENCOUNTER — Encounter (HOSPITAL_COMMUNITY): Payer: Self-pay | Admitting: Registered Nurse

## 2022-09-11 DIAGNOSIS — Z880 Allergy status to penicillin: Secondary | ICD-10-CM

## 2022-09-11 DIAGNOSIS — F1210 Cannabis abuse, uncomplicated: Secondary | ICD-10-CM | POA: Diagnosis not present

## 2022-09-11 DIAGNOSIS — Z79899 Other long term (current) drug therapy: Secondary | ICD-10-CM | POA: Diagnosis not present

## 2022-09-11 DIAGNOSIS — Z811 Family history of alcohol abuse and dependence: Secondary | ICD-10-CM | POA: Diagnosis not present

## 2022-09-11 DIAGNOSIS — F3160 Bipolar disorder, current episode mixed, unspecified: Secondary | ICD-10-CM | POA: Diagnosis present

## 2022-09-11 DIAGNOSIS — Z8261 Family history of arthritis: Secondary | ICD-10-CM | POA: Diagnosis not present

## 2022-09-11 DIAGNOSIS — Z91410 Personal history of adult physical and sexual abuse: Secondary | ICD-10-CM | POA: Diagnosis not present

## 2022-09-11 DIAGNOSIS — Z91148 Patient's other noncompliance with medication regimen for other reason: Secondary | ICD-10-CM

## 2022-09-11 DIAGNOSIS — Z6372 Alcoholism and drug addiction in family: Secondary | ICD-10-CM | POA: Diagnosis not present

## 2022-09-11 DIAGNOSIS — F312 Bipolar disorder, current episode manic severe with psychotic features: Principal | ICD-10-CM | POA: Diagnosis present

## 2022-09-11 DIAGNOSIS — F17210 Nicotine dependence, cigarettes, uncomplicated: Secondary | ICD-10-CM | POA: Diagnosis present

## 2022-09-11 DIAGNOSIS — F17290 Nicotine dependence, other tobacco product, uncomplicated: Secondary | ICD-10-CM | POA: Diagnosis present

## 2022-09-11 DIAGNOSIS — F411 Generalized anxiety disorder: Secondary | ICD-10-CM | POA: Diagnosis not present

## 2022-09-11 DIAGNOSIS — Z833 Family history of diabetes mellitus: Secondary | ICD-10-CM | POA: Diagnosis not present

## 2022-09-11 DIAGNOSIS — F1220 Cannabis dependence, uncomplicated: Secondary | ICD-10-CM | POA: Diagnosis not present

## 2022-09-11 DIAGNOSIS — Z781 Physical restraint status: Secondary | ICD-10-CM

## 2022-09-11 DIAGNOSIS — Z8249 Family history of ischemic heart disease and other diseases of the circulatory system: Secondary | ICD-10-CM | POA: Diagnosis not present

## 2022-09-11 DIAGNOSIS — G4700 Insomnia, unspecified: Secondary | ICD-10-CM | POA: Diagnosis not present

## 2022-09-11 MED ORDER — ARIPIPRAZOLE 15 MG PO TABS
30.0000 mg | ORAL_TABLET | Freq: Every day | ORAL | Status: DC
  Administered 2022-09-11: 30 mg via ORAL
  Filled 2022-09-11 (×4): qty 2

## 2022-09-11 MED ORDER — POLYVINYL ALCOHOL 1.4 % OP SOLN
1.0000 [drp] | OPHTHALMIC | Status: DC | PRN
  Administered 2022-09-11: 1 [drp] via OPHTHALMIC
  Filled 2022-09-11: qty 15

## 2022-09-11 MED ORDER — TRAZODONE HCL 50 MG PO TABS
50.0000 mg | ORAL_TABLET | Freq: Every evening | ORAL | Status: DC | PRN
  Administered 2022-09-11 – 2022-09-16 (×5): 50 mg via ORAL
  Filled 2022-09-11 (×5): qty 1

## 2022-09-11 MED ORDER — OLANZAPINE 10 MG PO TABS: 10.0000 mg | ORAL_TABLET | Freq: Every day | ORAL | Status: DC | PRN

## 2022-09-11 MED ORDER — LORAZEPAM 1 MG PO TABS
2.0000 mg | ORAL_TABLET | Freq: Three times a day (TID) | ORAL | Status: DC | PRN
  Administered 2022-09-12 – 2022-09-16 (×2): 2 mg via ORAL
  Filled 2022-09-11 (×2): qty 2

## 2022-09-11 MED ORDER — ARIPIPRAZOLE 30 MG PO TABS: 30.0000 mg | ORAL_TABLET | Freq: Every day | ORAL | Status: DC

## 2022-09-11 MED ORDER — LITHIUM CARBONATE ER 450 MG PO TBCR
450.0000 mg | EXTENDED_RELEASE_TABLET | Freq: Every day | ORAL | Status: DC
  Administered 2022-09-11 – 2022-09-16 (×6): 450 mg via ORAL
  Filled 2022-09-11 (×9): qty 1

## 2022-09-11 MED ORDER — PROPRANOLOL HCL 10 MG PO TABS
10.0000 mg | ORAL_TABLET | Freq: Two times a day (BID) | ORAL | Status: DC
  Administered 2022-09-13 – 2022-09-17 (×6): 10 mg via ORAL
  Filled 2022-09-11 (×16): qty 1

## 2022-09-11 MED ORDER — HYDROXYZINE HCL 25 MG PO TABS: 25.0000 mg | ORAL_TABLET | Freq: Three times a day (TID) | ORAL | 0 refills | Status: DC | PRN

## 2022-09-11 MED ORDER — POLYVINYL ALCOHOL 1.4 % OP SOLN: 1.0000 [drp] | OPHTHALMIC | 0 refills | Status: DC | PRN

## 2022-09-11 MED ORDER — ALUM & MAG HYDROXIDE-SIMETH 200-200-20 MG/5ML PO SUSP: 30.0000 mL | ORAL | Status: DC | PRN

## 2022-09-11 MED ORDER — DIPHENHYDRAMINE HCL 50 MG/ML IJ SOLN: 50.0000 mg | Freq: Three times a day (TID) | INTRAMUSCULAR | Status: DC | PRN

## 2022-09-11 MED ORDER — ACETAMINOPHEN 325 MG PO TABS
650.0000 mg | ORAL_TABLET | Freq: Four times a day (QID) | ORAL | Status: DC | PRN
  Administered 2022-09-11 – 2022-09-12 (×4): 650 mg via ORAL
  Filled 2022-09-11 (×4): qty 2

## 2022-09-11 MED ORDER — DIPHENHYDRAMINE HCL 25 MG PO CAPS
50.0000 mg | ORAL_CAPSULE | Freq: Three times a day (TID) | ORAL | Status: DC | PRN
  Administered 2022-09-16: 50 mg via ORAL
  Filled 2022-09-11: qty 2

## 2022-09-11 MED ORDER — TRAZODONE HCL 50 MG PO TABS: 50.0000 mg | ORAL_TABLET | Freq: Every evening | ORAL | Status: DC | PRN

## 2022-09-11 MED ORDER — MAGNESIUM HYDROXIDE 400 MG/5ML PO SUSP: 30.0000 mL | Freq: Every day | ORAL | Status: DC | PRN

## 2022-09-11 MED ORDER — OLANZAPINE 10 MG PO TABS
10.0000 mg | ORAL_TABLET | Freq: Every day | ORAL | Status: DC | PRN
  Administered 2022-09-16: 10 mg via ORAL
  Filled 2022-09-11: qty 1

## 2022-09-11 MED ORDER — SODIUM CHLORIDE 0.9 % IN NEBU
INHALATION_SOLUTION | RESPIRATORY_TRACT | Status: AC
  Filled 2022-09-11: qty 3

## 2022-09-11 MED ORDER — HYDROXYZINE HCL 25 MG PO TABS
25.0000 mg | ORAL_TABLET | Freq: Three times a day (TID) | ORAL | Status: DC | PRN
  Administered 2022-09-11 – 2022-09-14 (×2): 25 mg via ORAL
  Filled 2022-09-11 (×2): qty 1

## 2022-09-11 MED ORDER — LITHIUM CARBONATE ER 450 MG PO TBCR: 450.0000 mg | EXTENDED_RELEASE_TABLET | Freq: Every day | ORAL | Status: DC

## 2022-09-11 MED ORDER — PROPRANOLOL HCL 10 MG PO TABS: 10.0000 mg | ORAL_TABLET | Freq: Two times a day (BID) | ORAL | Status: DC

## 2022-09-11 NOTE — ED Notes (Signed)
Pt was given meatloaf, veggies, cookie,and juice for lunch.  

## 2022-09-11 NOTE — ED Provider Notes (Signed)
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 07/17/2022 None Detected  NONE DETECTED (Cut Off Level 300 ng/mL) Final   POC Methadone UR 07/17/2022 None Detected  NONE DETECTED (Cut Off Level 300 ng/mL) Final   POC Oxycodone UR 07/17/2022 None Detected  NONE DETECTED (Cut Off Level 100 ng/mL) Final   POC Marijuana UR 07/17/2022 Positive (A)  NONE DETECTED (Cut Off Level 50 ng/mL) Final   Neisseria Gonorrhea 07/17/2022 Negative   Final   Chlamydia 07/17/2022 Negative   Final   Comment 07/17/2022 Normal Reference Ranger Chlamydia -  Negative   Final   Comment 07/17/2022 Normal Reference Range Neisseria Gonorrhea - Negative   Final   HIV Screen 4th Generation wRfx 07/17/2022 Non Reactive  Non Reactive Final   Performed at [facility], [address], [place], [place] [address]   hCG, Conley Rolls, Sharene Butters, Kathie Rhodes 07/17/2022 <1  <5 mIU/mL Final   Comment:          GEST. AGE      CONC.  (mIU/mL)   <=1 WEEK        5 - 50     2 WEEKS       50 - 500     3 WEEKS       100 - 10,000     4 WEEKS     1,000 - 30,000     5 WEEKS     3,500 - 115,000   6-8 WEEKS     12,000 - 270,000    12 WEEKS     15,000 - 220,000        FEMALE AND NON-PREGNANT FEMALE:     LESS THAN 5 mIU/mL Performed at [facility], [address], [place], [place] [address]    SARSCOV2ONAVIRUS 2 AG 07/17/2022 NEGATIVE  NEGATIVE Final   Comment: (NOTE) SARS-CoV-2 antigen NOT DETECTED.   Negative results are presumptive.  Negative results do not preclude SARS-CoV-2 infection and should not be used as the sole basis for treatment or other patient management decisions, including infection  control decisions, particularly in the presence of clinical signs and  symptoms consistent with COVID-19, or in those who have been in contact with the virus.  Negative results must be combined with clinical observations, patient history, and epidemiological information. The expected result is Negative.  Fact Sheet for Patients: https://www.jennings-kim.com/  Fact Sheet for Healthcare Providers:  https://alexander-rogers.biz/  This test is not yet approved or cleared by the [place] and  has been authorized for detection and/or diagnosis of SARS-CoV-2 by FDA under an Emergency Use Authorization (EUA).  This EUA will remain in effect (meaning this test can be used) for the duration of  the COV                          ID-19 declaration under Section 564(b)(1) of the Act, 21 U.S.C. section 360bbb-3(b)(1), unless the authorization  is terminated or revoked sooner.    Admission on 07/17/2022, Discharged on 07/17/2022  Component Date Value Ref Range Status   Sodium 07/17/2022 133 (L)  135 - 145 mmol/L Final   Potassium 07/17/2022 3.2 (L)  3.5 - 5.1 mmol/L Final   Chloride 07/17/2022 102  98 - 111 mmol/L Final   CO2 07/17/2022 22  22 - 32 mmol/L Final   Glucose, Bld 07/17/2022 119 (H)  70 - 99 mg/dL Final   Glucose reference range applies only to samples taken after fasting for at least 8 hours.   BUN 07/17/2022 10  6 - 20 mg/dL Final   Creatinine, Ser 07/17/2022 0.67  0.44 - 1.00 mg/dL Final   Calcium 16/01/9603 8.6 (L)  8.9 - 10.3 mg/dL Final   Total Protein 54/12/8117 7.2  6.5 - 8.1 g/dL Final   Albumin 14/78/2956 4.1  3.5 - 5.0 g/dL Final   AST 21/30/8657 29  15 - 41 U/L Final   ALT 07/17/2022 20  0 - 44 U/L Final   Alkaline Phosphatase 07/17/2022 51  38 - 126 U/L Final   Total Bilirubin 07/17/2022 1.2  0.3 - 1.2 mg/dL Final   GFR, Estimated 07/17/2022 >60  >60 mL/min Final   Comment: (NOTE) Calculated using the CKD-EPI Creatinine Equation (2021)    Anion gap 07/17/2022 9  5 - 15 Final   Performed at [facility], [address] [place], [place] [address]   Alcohol, Ethyl (B) 07/17/2022 <10  <10 mg/dL Final   Comment: (NOTE) Lowest detectable limit for serum alcohol is 10 mg/dL.  For medical purposes only. Performed at [facility], [address] [place], [place] [address]    WBC 07/17/2022 9.6  4.0 - 10.5 K/uL Final   RBC 07/17/2022 4.28  3.87 - 5.11 MIL/uL Final   Hemoglobin 07/17/2022 13.4  12.0 - 15.0 g/dL Final   HCT 41/32/4401 39.2  36.0 - 46.0 % Final   MCV 07/17/2022 91.6  80.0 - 100.0 fL Final   MCH 07/17/2022 31.3  26.0 - 34.0 pg Final   MCHC 07/17/2022 34.2  30.0 - 36.0 g/dL Final   RDW 02/72/5366 13.0  11.5 - 15.5 % Final   Platelets 07/17/2022 285  150 - 400 K/uL Final   nRBC 07/17/2022 0.0  0.0 - 0.2 % Final   Neutrophils Relative % 07/17/2022  46  % Final   Neutro Abs 07/17/2022 4.5  1.7 - 7.7 K/uL Final   Lymphocytes Relative 07/17/2022 41  % Final   Lymphs Abs 07/17/2022 3.9  0.7 - 4.0 K/uL Final   Monocytes Relative 07/17/2022 10  % Final   Monocytes Absolute 07/17/2022 0.9  0.1 - 1.0 K/uL Final   Eosinophils Relative 07/17/2022 2  % Final   Eosinophils Absolute 07/17/2022 0.2  0.0 - 0.5 K/uL Final   Basophils Relative 07/17/2022 1  % Final   Basophils Absolute 07/17/2022 0.1  0.0 -  0.1 K/uL Final   Immature Granulocytes 07/17/2022 0  % Final   Abs Immature Granulocytes 07/17/2022 0.02  0.00 - 0.07 K/uL Final   Performed at [facility], [address], [place], [place] [address]   I-stat hCG, quantitative 07/17/2022 <5.0  <5 mIU/mL Final   Comment 3 07/17/2022          Final   Comment:   GEST. AGE      CONC.  (mIU/mL)   <=1 WEEK        5 - 50     2 WEEKS       50 - 500     3 WEEKS       100 - 10,000     4 WEEKS     1,000 - 30,000        FEMALE AND NON-PREGNANT FEMALE:     LESS THAN 5 mIU/mL    Salicylate Lvl 07/17/2022 <7.0 (L)  7.0 - 30.0 mg/dL Final   Performed at [facility], [address], [place], [place] [address]   Acetaminophen (Tylenol), Serum 07/17/2022 <10 (L)  10 - 30 ug/mL Final   Comment: (NOTE) Therapeutic concentrations vary significantly. A range of 10-30 ug/mL  may be an effective concentration for many patients. However, some  are best treated at concentrations outside of this range. Acetaminophen concentrations >150 ug/mL at 4 hours after ingestion  and >50 ug/mL at 12 hours after ingestion are often associated with  toxic reactions.  Performed at [facility], [address], [place], [place] [address]    Lithium Lvl 07/17/2022 0.07 (L)  0.60 - 1.20 mmol/L Final   Performed at [facility], [address], [place], [place] [address]   hCG, Conley Rolls, Quant, Kathie Rhodes 07/17/2022 <1  <5 mIU/mL Final   Comment:           GEST. AGE      CONC.  (mIU/mL)   <=1 WEEK        5 - 50     2 WEEKS       50 - 500     3 WEEKS       100 - 10,000     4 WEEKS     1,000 - 30,000     5 WEEKS     3,500 - 115,000   6-8 WEEKS     12,000 - 270,000    12 WEEKS     15,000 - 220,000        FEMALE AND NON-PREGNANT FEMALE:     LESS THAN 5 mIU/mL Performed at [facility], [address] [place], [place] [address]   Admission on 07/13/2022, Discharged on 07/13/2022  Component Date Value Ref Range Status   Preg Test, Ur 07/13/2022 NEGATIVE  NEGATIVE Final   Comment:        THE SENSITIVITY OF THIS METHODOLOGY IS >24 mIU/mL    Glucose, UA 07/13/2022 NEGATIVE  NEGATIVE mg/dL Final   Bilirubin Urine 07/13/2022 NEGATIVE  NEGATIVE Final   Ketones, ur 07/13/2022 TRACE (A)  NEGATIVE mg/dL Final   Specific Gravity, Urine 07/13/2022 1.025  1.005 - 1.030 Final   Hgb urine dipstick 07/13/2022 NEGATIVE  NEGATIVE Final   pH 07/13/2022 6.0  5.0 - 8.0 Final   Protein, ur 07/13/2022 NEGATIVE  NEGATIVE mg/dL Final   Urobilinogen, UA 07/13/2022 0.2  0.0 - 1.0 mg/dL Final   Nitrite 13/11/6576 NEGATIVE  NEGATIVE Final  Leukocytes,Ua 07/13/2022 NEGATIVE  NEGATIVE Final   Biochemical Testing Only. Please order routine urinalysis from main lab if confirmatory testing is needed.   hCG, Beta Chain, Quant, S 07/13/2022 <1  <5 mIU/mL Final   Comment:          GEST. AGE      CONC.  (mIU/mL)   <=1 WEEK        5 - 50     2 WEEKS       50 - 500     3 WEEKS       100 - 10,000     4 WEEKS     1,000 - 30,000     5 WEEKS     3,500 - 115,000   6-8 WEEKS     12,000 - 270,000    12 WEEKS     15,000 - 220,000        FEMALE AND NON-PREGNANT FEMALE:     LESS THAN 5 mIU/mL Performed at [facility], [address] [place], [place] [address]   Office Visit on 07/06/2022  Component Date Value Ref Range Status   TSH 07/06/2022 0.81  0.35 - 5.50 uIU/mL Final   Free T4 07/06/2022 1.17  0.60 - 1.60 ng/dL Final   Comment: Specimens from  patients who are undergoing biotin therapy and /or ingesting biotin supplements may contain high levels of biotin.  The higher biotin concentration in these specimens interferes with this Free T4 assay.  Specimens that contain high levels  of biotin may cause false high results for this Free T4 assay.  Please interpret results in light of the total clinical presentation of the patient.     Sodium 07/06/2022 139  135 - 145 mEq/L Final   Potassium 07/06/2022 4.0  3.5 - 5.1 mEq/L Final   Chloride 07/06/2022 103  96 - 112 mEq/L Final   CO2 07/06/2022 29  19 - 32 mEq/L Final   Glucose, Bld 07/06/2022 96  70 - 99 mg/dL Final   BUN 82/95/6213 10  6 - 23 mg/dL Final   Creatinine, Ser 07/06/2022 0.80  0.40 - 1.20 mg/dL Final   Total Bilirubin 07/06/2022 0.7  0.2 - 1.2 mg/dL Final   Alkaline Phosphatase 07/06/2022 51  39 - 117 U/L Final   AST 07/06/2022 15  0 - 37 U/L Final   ALT 07/06/2022 11  0 - 35 U/L Final   Total Protein 07/06/2022 7.9  6.0 - 8.3 g/dL Final   Albumin 08/65/7846 4.8  3.5 - 5.2 g/dL Final   GFR 96/29/5284 98.96  >60.00 mL/min Final   Calculated using the CKD-EPI Creatinine Equation (2021)   Calcium 07/06/2022 10.1  8.4 - 10.5 mg/dL Final   WBC 13/24/4010 7.7  4.0 - 10.5 K/uL Final   RBC 07/06/2022 4.78  3.87 - 5.11 Mil/uL Final   Hemoglobin 07/06/2022 15.1 (H)  12.0 - 15.0 g/dL Final   HCT 27/25/3664 43.9  36.0 - 46.0 % Final   MCV 07/06/2022 91.9  78.0 - 100.0 fl Final   MCHC 07/06/2022 34.4  30.0 - 36.0 g/dL Final   RDW 40/34/7425 13.4  11.5 - 15.5 % Final   Platelets 07/06/2022 314.0  150.0 - 400.0 K/uL Final   Neutrophils Relative % 07/06/2022 47.8  43.0 - 77.0 % Final   Lymphocytes Relative 07/06/2022 38.3  12.0 - 46.0 % Final   Monocytes Relative 07/06/2022 11.8  3.0 - 12.0 % Final   Eosinophils Relative 07/06/2022 1.8  0.0 - 5.0 % Final   Basophils Relative 07/06/2022 0.3  0.0 - 3.0 % Final   Neutro Abs 07/06/2022 3.7  1.4 - 7.7 K/uL Final   Lymphs Abs 07/06/2022  3.0  0.7 - 4.0 K/uL Final   Monocytes Absolute 07/06/2022 0.9  0.1 - 1.0 K/uL Final   Eosinophils Absolute 07/06/2022 0.1  0.0 - 0.7 K/uL Final   Basophils Absolute 07/06/2022 0.0  0.0 - 0.1 K/uL Final   HIV 1&2 Ab, 4th Generation 07/06/2022 NON-REACTIVE  NON-REACTIVE Final   Comment: HIV-1 antigen and HIV-1/HIV-2 antibodies were not detected. There is no laboratory evidence of HIV infection. Marland Kitchen PLEASE NOTE: This information has been disclosed to you from records whose confidentiality may be protected by state law.  If your state requires such protection, then the state law prohibits you from making any further disclosure of the information without the specific written consent of the person to whom it pertains, or as otherwise permitted by law. A general authorization for the release of medical or other information is NOT sufficient for this purpose. . For additional information please refer to http://education.questdiagnostics.com/faq/FAQ106 (This link is being provided for informational/ educational purposes only.) . Marland Kitchen The performance of this assay has not been clinically validated in patients less than 32 years old. .   There may be more visits with results that are not included.    Blood Alcohol level:  Lab Results  Component Value Date   ETH <10 09/10/2022   ETH <10 07/28/2022    Metabolic Disorder Labs: Lab Results  Component Value Date   HGBA1C 5.2 09/10/2022   MPG 102.54 09/10/2022   MPG 108.28 07/17/2022   Lab Results  Component Value Date   PROLACTIN 92.6 (H) 02/22/2016   PROLACTIN  10/31/2009    35.4 (NOTE)     Reference Ranges:                 Female:                       2.1 -  17.1 ng/ml                 Female:   Pregnant          9.7 - 208.5 ng/mL                           Non Pregnant      2.8 -  29.2 ng/mL                           Post  Menopausal   1.8 -  20.3 ng/mL                     Lab Results  Component Value Date   CHOL 134 07/17/2022    TRIG 37 07/17/2022   HDL 70 07/17/2022   CHOLHDL 1.9 07/17/2022   VLDL 7 07/17/2022   LDLCALC 57 07/17/2022   LDLCALC 62 02/22/2016    Therapeutic Lab Levels: Lab Results  Component Value Date   LITHIUM <0.06 (L) 09/10/2022   LITHIUM <0.06 (L) 07/28/2022   No results found for: "VALPROATE" No results found for: "CBMZ"  Physical Findings   AIMS    Flowsheet Row [facility]  AIMS Total Score 0      AUDIT  Flowsheet Row Ad[facility] (Dis[facility]  Alcohol Use Disorder Identification Test Final Score (AUDIT) 0      GAD-7    Flowsheet Row Routine Prenatal from 08/20/2020 in [facility] for [facility] at [facility]  Total GAD-7 Score 0      PHQ2-9    Flowsheet Row Office Visit from 05/24/2022 in [facility] for [facility] Healthcare at [place] Nutrition from 08/25/2020 in [facility] at [facility] Routine Prenatal from 08/20/2020 in [facility] for [facility] at [place] Initial Prenatal from 06/25/2020 in [facility] for [facility] at [place] Office Visit from 01/22/2017 in Primary Care at [facility] Total Score 0 0 0 0 0  PHQ-9 Total Score 0 -- 0 1 --      Flowsheet Row ED from 09/10/2022 in [facility] ED from 08/17/2022 in [facility] at [facility] ED from 08/16/2022 in [facility] Emergency Department at [facility]  C-SSRS RISK CATEGORY No Risk No Risk No Risk        Musculoskeletal  Strength & Muscle Tone: within normal limits Gait & Station: normal Patient leans: N/A  Psychiatric Specialty Exam  Presentation  General Appearance:  Appropriate for Environment  Eye Contact: Good  Speech: Clear and Coherent; Normal Rate  Speech Volume: Normal  Handedness: Right   Mood and Affect   Mood: Anxious  Affect: Congruent; Appropriate   Thought Process  Thought Processes: Coherent  Descriptions of Associations:Intact  Orientation:Full (Time, Place and Person)  Thought Content:Logical  Diagnosis of Schizophrenia or Schizoaffective disorder in past: Yes  Duration of Psychotic Symptoms: Greater than six months   Hallucinations:Hallucinations: Auditory Description of Auditory Hallucinations: Patient denies auditory hallucinations but she is seen talking to someone not there  Ideas of Reference:Delusions  Suicidal Thoughts:Suicidal Thoughts: No  Homicidal Thoughts:Homicidal Thoughts: No   Sensorium  Memory: Immediate Good; Recent Good  Judgment: Fair  Insight: Fair   Chartered certified accountant: Fair  Attention Span: Fair  Recall: Fair  Fund of Knowledge: Good  Language: Good   Psychomotor Activity  Psychomotor Activity: Psychomotor Activity: Normal   Assets  Assets: Communication Skills; Desire for Improvement; Financial Resources/Insurance; Housing; Leisure Time; Physical Health; Resilience; Social Support   Sleep  Sleep: Sleep: Good Number of Hours of Sleep: 0 (PT will not provide hours at sleep)   Nutritional Assessment (For OBS and FBC admissions only) Has the patient had a weight loss or gain of 10 pounds or more in the last 3 months?: No Has the patient had a decrease in food intake/or appetite?: No Does the patient have dental problems?: No Does the patient have eating habits or behaviors that may be indicators of an eating disorder including binging or inducing vomiting?: No Has the patient recently lost weight without trying?: 0 Has the patient been eating poorly because of a decreased appetite?: 0 Malnutrition Screening Tool Score: 0    Physical Exam  Physical Exam Vitals and nursing note reviewed.  Constitutional:      General: She is not in acute distress.    Appearance: Normal appearance. She is  not ill-appearing.  HENT:     Head: Normocephalic.  Eyes:     Conjunctiva/sclera: Conjunctivae normal.  Cardiovascular:     Rate and Rhythm: Normal rate.  Pulmonary:     Effort: Pulmonary effort is normal. No respiratory distress.  Musculoskeletal:  General: Normal range of motion.     Cervical back: Normal range of motion.  Skin:    General: Skin is warm and dry.  Neurological:     Mental Status: She is alert and oriented to person, place, and time.  Psychiatric:        Attention and Perception: Attention and perception normal.        Mood and Affect: Mood is anxious.        Speech: Speech normal.        Behavior: Behavior normal. Behavior is cooperative.        Thought Content: Thought content normal. Thought content is not paranoid or delusional. Thought content does not include homicidal or suicidal ideation.        Judgment: Judgment is impulsive.    Review of Systems  Constitutional:        No complaints voiced   Psychiatric/Behavioral:  Positive for substance abuse. Depression: Stable. Hallucinations: Denies. Suicidal ideas: Denies.The patient does not have insomnia. Nervous/anxious: Stable.  All other systems reviewed and are negative.  Blood pressure 105/66, pulse 63, temperature 97.7 F (36.5 C), temperature source Oral, resp. rate 18, last menstrual period 08/15/2022, SpO2 100 %, unknown if currently breastfeeding. There is no height or weight on file to calculate BMI.  Treatment Plan Summary: Daily contact with patient to assess and evaluate symptoms and progress in treatment, Medication management, and Plan Inpatient psychiatric treatment  Tausha Milhoan, NP 09/11/2022 10:43 AM

## 2022-09-11 NOTE — ED Notes (Signed)
Rn just called [facility] for transport to [facility].

## 2022-09-11 NOTE — Progress Notes (Signed)
   09/11/22 2045  Psych Admission Type (Psych Patients Only)  Admission Status Involuntary  Psychosocial Assessment  Patient Complaints Anxiety;Sadness;Worrying  Eye Contact Fair  Facial Expression Anxious  Affect Sad;Anxious  Speech Soft  Interaction Assertive  Motor Activity Slow  Appearance/Hygiene Unremarkable  Behavior Characteristics Cooperative  Mood Pleasant;Anxious  Thought Process  Coherency Disorganized  Content Paranoia;Blaming others  Delusions Paranoid  Perception Hallucinations  Hallucination Auditory;Visual  Judgment Impaired  Confusion None  Danger to Self  Current suicidal ideation? Denies  Danger to Others  Danger to Others None reported or observed   On assessment, patient is alert and oriented.  Patient reports that she was hospitalized here previously.  Patient believes that her mother and the father of her son, Rosalyn Gess, are working against her.  Patient also believes she is pregnant and states,"And my mom knows it."  Patient is cooperative and denies SI/HI.  Patient endorses AVH.  Administered scheduled medications and PRN Tylenol, Hydroxyzine, and Trazodone per [facility] per patient request.  Patient is safe on the unit with q 15 minute safety checks.

## 2022-09-11 NOTE — ED Notes (Signed)
Patient requested eye drops for dry eyes

## 2022-09-11 NOTE — ED Notes (Signed)
Patient in milieu. Environment is secured. Will continue to monitor for safety. 

## 2022-09-11 NOTE — Progress Notes (Signed)
Pt was accepted to [facility] TODAY 09/11/2022, pending IVC paperwork faxed to [phone]. Bed assignment: 507-1  Pt meets inpatient criteria per Assunta Found, NP  Attending Physician will be Phineas Inches, MD  Report can be called to: - Adult unit: [phone]  Pt can arrive after pending discharges; [facility] to coordinate arrival time  Care Team Notified: [facility] Rona Ravens, RN, Durwin Reges, RN, and Assunta Found, NP  Cathie Beams, LCSW  09/11/2022 11:43 AM

## 2022-09-11 NOTE — ED Notes (Addendum)
Report called to Ethelene Browns, RN at 1515 at [facility].

## 2022-09-11 NOTE — ED Notes (Signed)
Pt sleeping at present, no distress noted.  Monitoring for safety. 

## 2022-09-11 NOTE — Tx Team (Signed)
Initial Treatment Plan 09/11/2022 6:31 PM Flannery Randel Cohen NFA:[id number]    PATIENT STRESSORS: Health problems   Marital or family conflict     PATIENT STRENGTHS: Ability for insight  Communication skills  Supportive family/friends    PATIENT IDENTIFIED PROBLEMS: "To work on my attitude"  Hallucinations  Paranoia  Anxiety               DISCHARGE CRITERIA:  Ability to meet basic life and health needs Adequate post-discharge living arrangements Motivation to continue treatment in a less acute level of care  PRELIMINARY DISCHARGE PLAN: Attend aftercare/continuing care group Outpatient therapy Return to previous living arrangement  PATIENT/FAMILY INVOLVEMENT: This treatment plan has been presented to and reviewed with the patient, Cheryl Cohen, and/or family member.  The patient and family have been given the opportunity to ask questions and make suggestions.  Clarene Critchley, RN 09/11/2022, 6:31 PM

## 2022-09-11 NOTE — ED Notes (Signed)
Patients blood pressure was 105/66 pulse 63 oxygen was 100% on room air provider states it's ok to give propanolol.

## 2022-09-11 NOTE — Progress Notes (Signed)
Admission Note: Patient is a 32 year old female admitted to the unit from [facility] under IVC status for audiovisual hallucinations,  medication noncompliance and aggressive behaviors towards family members and father of her children.  Patient believes she's six months pregnant by her new boyfriend.  Patient presents with anxious mood and affect.  Blames her mother for coming to the hospital.  Stated she's here to work on her attitudes.  Admission plan of care reviewed, consent signed.  Skin and personal belongings completed.  Skin is dry and intact.  No contraband found.  Patient oriented to the unit, staff and room.  Routine safety checks initiated.  Support and encouragement offered.  Verbalizes understanding of unit rules/protocols.  Patient is safe on the unit.

## 2022-09-11 NOTE — Group Note (Signed)
Date:  09/11/2022 Time:  9:21 PM  Group Topic/Focus:  Wrap-Up Group:   The focus of this group is to help patients review their daily goal of treatment and discuss progress on daily workbooks.    Participation Level:  Active  Participation Quality:  Appropriate  Affect:  Appropriate  Cognitive:  Appropriate  Insight: Appropriate  Engagement in Group:  Engaged  Modes of Intervention:  Education and Exploration  Additional Comments:  Patient attended and participated in group tonight. She reports that the significant thing that happen for her today was God woke her up this morning  and also she was able to some of her people.  Lita Mains [facility] 09/11/2022, 9:21 PM

## 2022-09-11 NOTE — ED Notes (Signed)
Patient  sleeping in no acute stress. RR even and unlabored .Environment secured .Will continue to monitor for safely. 

## 2022-09-11 NOTE — Plan of Care (Signed)
  Problem: Education: Goal: Verbalization of understanding the information provided will improve Outcome: Progressing   Problem: Activity: Goal: Interest or engagement in activities will improve Outcome: Progressing   

## 2022-09-11 NOTE — ED Notes (Signed)
Patient alert and oriented x 3. Denies SI/HI/AVH. Denies intent or plan to harm self or others. Routine conducted according to faculty protocol. Encourage patient to notify staff with any needs or concerns. Patient verbalized agreement and understanding. Will continue to monitor for safety.Patient was sitting at bedside talking  and laughing to her self.

## 2022-09-12 DIAGNOSIS — F312 Bipolar disorder, current episode manic severe with psychotic features: Secondary | ICD-10-CM

## 2022-09-12 MED ORDER — VITAMIN B-1 100 MG PO TABS
100.0000 mg | ORAL_TABLET | Freq: Every day | ORAL | Status: DC
  Administered 2022-09-13 – 2022-09-17 (×5): 100 mg via ORAL
  Filled 2022-09-12 (×7): qty 1

## 2022-09-12 MED ORDER — LOPERAMIDE HCL 2 MG PO CAPS: 2.0000 mg | ORAL_CAPSULE | ORAL | Status: AC | PRN

## 2022-09-12 MED ORDER — ARIPIPRAZOLE 10 MG PO TABS
10.0000 mg | ORAL_TABLET | Freq: Every day | ORAL | Status: DC
  Administered 2022-09-12: 10 mg via ORAL
  Filled 2022-09-12 (×3): qty 1

## 2022-09-12 MED ORDER — ADULT MULTIVITAMIN W/MINERALS CH
1.0000 | ORAL_TABLET | Freq: Every day | ORAL | Status: DC
  Administered 2022-09-13 – 2022-09-17 (×5): 1 via ORAL
  Filled 2022-09-12 (×7): qty 1

## 2022-09-12 MED ORDER — ONDANSETRON 4 MG PO TBDP: 4.0000 mg | ORAL_TABLET | Freq: Four times a day (QID) | ORAL | Status: AC | PRN

## 2022-09-12 MED ORDER — THIAMINE HCL 100 MG/ML IJ SOLN: 100.0000 mg | Freq: Once | INTRAMUSCULAR | Status: DC

## 2022-09-12 NOTE — Progress Notes (Signed)
  Patient is awake.  Patient has become more anxious and agitated.  Patient reports she keeps salivating and it's aggravating her.  Patient in room stating, "It's getting real in here, if yall don't believe in aliens."  Patient states, "I'm and alien."  Administered PRN Ativan per [facility] per patient request.

## 2022-09-12 NOTE — Plan of Care (Signed)
°  Problem: Education: °Goal: Emotional status will improve °Outcome: Progressing °Goal: Mental status will improve °Outcome: Progressing °  °Problem: Activity: °Goal: Interest or engagement in activities will improve °Outcome: Progressing °  °

## 2022-09-12 NOTE — Progress Notes (Signed)
1610 Patient received Tylenol 650 MG PO for level 5/10 tooth pain with improvement noted.  Patient a little drowsy in the AM.  Patient continues with delusions of pregnancy.  Visual and auditory hallucinations continue.  Patient is cooperative and compliant.  Patient is taking her meds as ordered. Propranolol held in AM and PM for BP readings of 101/75 and 100/68. Patient attended 1 out of 2 groups today.  Will continue to monitor while remains on Q 15 minute checks.

## 2022-09-12 NOTE — Progress Notes (Signed)
   09/12/22 0547  15 Minute Checks  Location Bedroom  Visual Appearance Calm  Behavior Composed  Sleep (Behavioral Health Patients Only)  Calculate sleep? (Click Yes once per 24 hr at 0600 safety check) Yes  Documented sleep last 24 hours 4.75

## 2022-09-12 NOTE — Progress Notes (Signed)
   09/12/22 2106  Psych Admission Type (Psych Patients Only)  Admission Status Involuntary  Psychosocial Assessment  Patient Complaints None  Eye Contact Fair  Facial Expression Anxious  Affect Anxious  Speech Soft;Slow  Interaction Assertive  Motor Activity Slow  Appearance/Hygiene Unremarkable  Behavior Characteristics Cooperative  Mood Anxious  Thought Process  Coherency Disorganized  Content Paranoia;Blaming others;Delusions  Delusions Paranoid  Perception Hallucinations  Hallucination Auditory;Visual  Judgment Impaired  Confusion None  Danger to Self  Current suicidal ideation? Denies  Danger to Others  Danger to Others None reported or observed
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
## 2022-09-12 NOTE — Group Note (Signed)
Recreation Therapy Group Note   Group Topic:Problem Solving  Group Date: 09/12/2022 Start Time: 1038 End Time: 1115 Facilitators: Yamin Swingler-McCall, LRT,CTRS Location: [address] Dayroom   Goal Area(s) Addresses:  Patient will effectively work with peer towards shared goal.  Patient will identify skills used to make activity successful.  Patient will share challenges and verbalize solution-driven approaches used. Patient will identify how skills used during activity can be used to reach post d/c goals.   Group Description:  [employer]. Patients were provided the following materials: 2 drinking straws, 5 rubber bands, 5 paper clips, 2 index cards and 2 drinking cups. Using the provided materials patients were asked to build a launching mechanism to launch a ping pong ball across the room, approximately 10 feet. Patients were divided into teams of 3-5. Instructions required all materials be incorporated into the device, functionality of items left to the peer group's discretion.   Affect/Mood: Drowsy   Participation Level: Moderate   Participation Quality: Independent   Behavior: Sedated   Speech/Thought Process: Slurred   Insight: None   Judgement: None   Modes of Intervention: STEM Activity   Patient Response to Interventions:  Receptive   Education Outcome:  Acknowledges education   Clinical Observations/Individualized Feedback: Pt participated as best she could.  Pt was attempting to help peers complete activity.  Pt was drowsy and had hard time staying awake.  Pt eventually went to room to lay down.     Plan: Continue to engage patient in RT group sessions 2-3x/week.   Rollen Selders-McCall, LRT,CTRS 09/12/2022 12:08 PM
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
## 2022-09-12 NOTE — Group Note (Unsigned)
Date:  09/14/2022 Time:  10:20 AM  Group Topic/Focus:  Goals Group:   The focus of this group is to help patients establish daily goals to achieve during treatment and discuss how the patient can incorporate goal setting into their daily lives to aide in recovery. Orientation:   The focus of this group is to educate the patient on the purpose and policies of crisis stabilization and provide a format to answer questions about their admission.  The group details unit policies and expectations of patients while admitted.    Participation Level:  Active  Participation Quality:  Appropriate and Attentive  Affect:  Appropriate  Cognitive:  Appropriate  Insight: Appropriate  Engagement in Group:  Engaged  Modes of Intervention:  Discussion  Additional Comments:  Patient attended goal group and was attentive the duration of the group. Patient's goal was to be more active during the day.   Cheryl Cohen T Kadey Mihalic 09/14/2022, 10:20 AM

## 2022-09-12 NOTE — Plan of Care (Signed)
  Problem: Activity: Goal: Interest or engagement in activities will improve Outcome: Progressing   Problem: Safety: Goal: Periods of time without injury will increase Outcome: Progressing   Problem: Coping: Goal: Coping ability will improve Outcome: Progressing

## 2022-09-12 NOTE — Progress Notes (Signed)
Adult Psychoeducational Group Note  Date:  09/12/2022 Time:  10:53 AM  Group Topic/Focus:  Goals Group:   The focus of this group is to help patients establish daily goals to achieve during treatment and discuss how the patient can incorporate goal setting into their daily lives to aide in recovery. Orientation:   The focus of this group is to educate the patient on the purpose and policies of crisis stabilization and provide a format to answer questions about their admission.  The group details unit policies and expectations of patients while admitted.  Participation Level:  NA   Participation Quality:  NA  Affect:  NA  Cognitive:  NA  Insight: NA  Engagement in Group:  NA  Modes of Intervention:  NA  Additional Comments:  Pt did not attend group  Cheryl Cohen 09/12/2022, 10:53 AM
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
## 2022-09-13 ENCOUNTER — Encounter (HOSPITAL_COMMUNITY): Payer: Self-pay

## 2022-09-13 DIAGNOSIS — F312 Bipolar disorder, current episode manic severe with psychotic features: Secondary | ICD-10-CM | POA: Diagnosis not present

## 2022-09-13 MED ORDER — IBUPROFEN 200 MG PO TABS
200.0000 mg | ORAL_TABLET | Freq: Four times a day (QID) | ORAL | Status: DC | PRN
  Administered 2022-09-13 – 2022-09-16 (×9): 200 mg via ORAL
  Filled 2022-09-13 (×9): qty 1

## 2022-09-13 MED ORDER — ARIPIPRAZOLE 10 MG PO TABS
20.0000 mg | ORAL_TABLET | Freq: Every day | ORAL | Status: DC
  Administered 2022-09-13 – 2022-09-16 (×4): 20 mg via ORAL
  Filled 2022-09-13 (×7): qty 2

## 2022-09-13 MED ORDER — BENZOCAINE 10 % MT GEL
Freq: Two times a day (BID) | OROMUCOSAL | Status: DC | PRN
  Administered 2022-09-14 – 2022-09-15 (×2): 1 via OROMUCOSAL
  Filled 2022-09-13: qty 9

## 2022-09-13 NOTE — Progress Notes (Signed)
   09/13/22 0600  15 Minute Checks  Location Dayroom  Visual Appearance Calm  Behavior Composed  Sleep (Behavioral Health Patients Only)  Calculate sleep? (Click Yes once per 24 hr at 0600 safety check) Yes  Documented sleep last 24 hours 6

## 2022-09-13 NOTE — [facility] Suicide Risk Assessment (Signed)
[facility] INPATIENT:  Family/Significant Other Suicide Prevention Education  Suicide Prevention Education:  Education Completed; Cheryl Cohen, [phone]  (name of family member/significant other) has been identified by the patient as the family member/significant other with whom the patient will be residing, and identified as the person(s) who will aid the patient in the event of a mental health crisis (suicidal ideations/suicide attempt).  With written consent from the patient, the family member/significant other has been provided the following suicide prevention education, prior to the and/or following the discharge of the patient.  CSW spke with mother who states that since April patient has had manic episodes.  She reports that patient has been drinking and smoking more and less med compliant.  She was given an injection on June 2nd.  She reports patient got a new boyfriend in January and he has been a bad influence.  She switched medications because she wasn't taken lithium and needed to be on an injection.  No guns/weapons.  No additional safety concerns. Patient lives independently in her home but mother will be on a cruise starting on Saturday for a week.   The suicide prevention education provided includes the following: Suicide risk factors Suicide prevention and interventions National Suicide Hotline telephone number [facility] assessment telephone number [facility] Emergency Assistance 911 [facility] and/or Residential Mobile Crisis Unit telephone number  Request made of family/significant other to: Remove weapons (e.g., guns, rifles, knives), all items previously/currently identified as safety concern.   Remove drugs/medications (over-the-counter, prescriptions, illicit drugs), all items previously/currently identified as a safety concern.  The family member/significant other verbalizes understanding of the suicide prevention education information provided.  The  family member/significant other agrees to remove the items of safety concern listed above.  Cheryl Cohen E Cheryl Cohen 09/13/2022, 2:13 PM

## 2022-09-13 NOTE — Group Note (Signed)
Date:  09/13/2022 Time:  8:49 PM  Group Topic/Focus:  Wrap-Up Group:   The focus of this group is to help patients review their daily goal of treatment and discuss progress on daily workbooks.    Participation Level:  Active  Participation Quality:  Appropriate  Affect:  Appropriate  Cognitive:  Appropriate  Insight: Appropriate  Engagement in Group:  Engaged  Modes of Intervention:  Education and Exploration  Additional Comments:  Patient attended and participated in group tonight. She reports that her day was great. She went outside with the group and played basket ball. She fellowship with her peers and the staff is great.  Lita Mains [facility] 09/13/2022, 8:49 PM
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
## 2022-09-13 NOTE — Progress Notes (Addendum)
   09/13/22 2039  Psych Admission Type (Psych Patients Only)  Admission Status Involuntary  Psychosocial Assessment  Patient Complaints Anxiety  Eye Contact Fair  Facial Expression Anxious;Animated  Affect Anxious  Speech Soft;Slow  Interaction Assertive  Motor Activity Slow  Appearance/Hygiene Unremarkable  Behavior Characteristics Cooperative  Mood Pleasant;Anxious;Preoccupied  Thought Process  Coherency Circumstantial;Disorganized  Content Paranoia;Preoccupation  Delusions Paranoid  Perception Hallucinations  Hallucination Auditory  Judgment Impaired  Confusion None  Danger to Self  Current suicidal ideation? Denies  Danger to Others  Danger to Others None reported or observed    On assessment, patient is observed in the hallway and dayroom interacting with peers and dancing.  Patient presents at medication window, mildly anxious.  Patient reports that she played football, basketball and cheerleading while outside today.  Patient denies SI/HI, but appears to be responding to internal stimuli.    Administered scheduled medications.  Writer provided patient with reassurance.  Patient is safe on the unit with q-15 minute safety checks.

## 2022-09-13 NOTE — Plan of Care (Signed)
°  Problem: Education: °Goal: Emotional status will improve °Outcome: Progressing °Goal: Mental status will improve °Outcome: Progressing °  °Problem: Activity: °Goal: Interest or engagement in activities will improve °Outcome: Progressing °  °

## 2022-09-13 NOTE — BH IP Treatment Plan (Signed)
Interdisciplinary Treatment and Diagnostic Plan Update  09/13/2022 Time of Session: 11:00 AM  Cheryl Cohen MRN: [id number]  Principal Diagnosis: Bipolar affective disorder, current episode manic with psychotic symptoms (HCC)  Secondary Diagnoses: Principal Problem:   Bipolar affective disorder, current episode manic with psychotic symptoms (HCC) Active Problems:   Generalized anxiety disorder   Insomnia   Current Medications:  Current Facility-Administered Medications  Medication Dose Route Frequency Provider Last Rate Last Admin   alum & mag hydroxide-simeth (MAALOX/MYLANTA) 200-200-20 MG/5ML suspension 30 mL  30 mL Oral Q4H PRN Rankin, Shuvon B, NP       ARIPiprazole (ABILIFY) tablet 10 mg  10 mg Oral QHS Nkwenti, Doris, NP   10 mg at 09/12/22 2106   benzocaine (ORAJEL) 10 % mucosal gel   Mouth/Throat BID PRN Onuoha, Chinwendu V, NP       diphenhydrAMINE (BENADRYL) capsule 50 mg  50 mg Oral TID PRN Rankin, Shuvon B, NP       Or   diphenhydrAMINE (BENADRYL) injection 50 mg  50 mg Intramuscular TID PRN Rankin, Shuvon B, NP       hydrOXYzine (ATARAX) tablet 25 mg  25 mg Oral TID PRN Rankin, Shuvon B, NP   25 mg at 09/11/22 2047   ibuprofen (ADVIL) tablet 200 mg  200 mg Oral Q6H PRN Onuoha, Chinwendu V, NP   200 mg at 09/13/22 3329   lithium carbonate (ESKALITH) ER tablet 450 mg  450 mg Oral QHS Rankin, Shuvon B, NP   450 mg at 09/12/22 2106   loperamide (IMODIUM) capsule 2-4 mg  2-4 mg Oral PRN Starleen Blue, NP       LORazepam (ATIVAN) tablet 2 mg  2 mg Oral Q8H PRN Rankin, Shuvon B, NP   2 mg at 09/12/22 0230   magnesium hydroxide (MILK OF MAGNESIA) suspension 30 mL  30 mL Oral Daily PRN Rankin, Shuvon B, NP       multivitamin with minerals tablet 1 tablet  1 tablet Oral Daily Nkwenti, Doris, NP   1 tablet at 09/13/22 0800   OLANZapine (ZYPREXA) tablet 10 mg  10 mg Oral Daily PRN Rankin, Shuvon B, NP       ondansetron (ZOFRAN-ODT) disintegrating tablet 4 mg  4 mg Oral Q6H PRN  Nkwenti, Doris, NP       propranolol (INDERAL) tablet 10 mg  10 mg Oral BID Rankin, Shuvon B, NP   10 mg at 09/13/22 0800   thiamine (Vitamin B-1) tablet 100 mg  100 mg Oral Daily Nkwenti, Doris, NP   100 mg at 09/13/22 0800   thiamine (VITAMIN B1) injection 100 mg  100 mg Intramuscular Once Starleen Blue, NP       traZODone (DESYREL) tablet 50 mg  50 mg Oral QHS PRN Rankin, Shuvon B, NP   50 mg at 09/11/22 2047   PTA Medications: Medications Prior to Admission  Medication Sig Dispense Refill Last Dose   ARIPiprazole (ABILIFY) 30 MG tablet Take 1 tablet (30 mg total) by mouth at bedtime.      ARISTADA 662 MG/2.4ML prefilled syringe Inject 662 mg into the muscle every 28 (twenty-eight) days.      hydrOXYzine (ATARAX) 25 MG tablet Take 1 tablet (25 mg total) by mouth 3 (three) times daily as needed for anxiety. 30 tablet 0    lithium carbonate (ESKALITH) 450 MG ER tablet Take 1 tablet (450 mg total) by mouth at bedtime.      naproxen (NAPROSYN) 500 MG tablet Take 1  tablet (500 mg total) by mouth 2 (two) times daily. (Patient not taking: Reported on 09/11/2022) 20 tablet 0    OLANZapine (ZYPREXA) 10 MG tablet Take 1 tablet (10 mg total) by mouth daily as needed (Agitation).      polyvinyl alcohol (LIQUIFILM TEARS) 1.4 % ophthalmic solution Place 1 drop into both eyes as needed for dry eyes. 15 mL 0    propranolol (INDERAL) 10 MG tablet Take 1 tablet (10 mg total) by mouth 2 (two) times daily.      traZODone (DESYREL) 50 MG tablet Take 1 tablet (50 mg total) by mouth at bedtime as needed for sleep.       Patient Stressors: Health problems   Marital or family conflict    Patient Strengths: Ability for insight  Communication skills  Supportive family/friends   Treatment Modalities: Medication Management, Group therapy, Case management,  1 to 1 session with clinician, Psychoeducation, Recreational therapy.   Physician Treatment Plan for Primary Diagnosis: Bipolar affective disorder, current  episode manic with psychotic symptoms (HCC) Long Term Goal(s): Improvement in symptoms so as ready for discharge   Short Term Goals: Ability to identify changes in lifestyle to reduce recurrence of condition will improve Ability to verbalize feelings will improve Ability to disclose and discuss suicidal ideas Ability to demonstrate self-control will improve Ability to identify and develop effective coping behaviors will improve Ability to maintain clinical measurements within normal limits will improve Compliance with prescribed medications will improve Ability to identify triggers associated with substance abuse/mental health issues will improve  Medication Management: Evaluate patient's response, side effects, and tolerance of medication regimen.  Therapeutic Interventions: 1 to 1 sessions, Unit Group sessions and Medication administration.  Evaluation of Outcomes: Not Progressing  Physician Treatment Plan for Secondary Diagnosis: Principal Problem:   Bipolar affective disorder, current episode manic with psychotic symptoms (HCC) Active Problems:   Generalized anxiety disorder   Insomnia  Long Term Goal(s): Improvement in symptoms so as ready for discharge   Short Term Goals: Ability to identify changes in lifestyle to reduce recurrence of condition will improve Ability to verbalize feelings will improve Ability to disclose and discuss suicidal ideas Ability to demonstrate self-control will improve Ability to identify and develop effective coping behaviors will improve Ability to maintain clinical measurements within normal limits will improve Compliance with prescribed medications will improve Ability to identify triggers associated with substance abuse/mental health issues will improve     Medication Management: Evaluate patient's response, side effects, and tolerance of medication regimen.  Therapeutic Interventions: 1 to 1 sessions, Unit Group sessions and Medication  administration.  Evaluation of Outcomes: Not Progressing   RN Treatment Plan for Primary Diagnosis: Bipolar affective disorder, current episode manic with psychotic symptoms (HCC) Long Term Goal(s): Knowledge of disease and therapeutic regimen to maintain health will improve  Short Term Goals: Ability to remain free from injury will improve, Ability to verbalize frustration and anger appropriately will improve, Ability to demonstrate self-control, Ability to participate in decision making will improve, Ability to verbalize feelings will improve, Ability to disclose and discuss suicidal ideas, Ability to identify and develop effective coping behaviors will improve, and Compliance with prescribed medications will improve  Medication Management: RN will administer medications as ordered by provider, will assess and evaluate patient's response and provide education to patient for prescribed medication. RN will report any adverse and/or side effects to prescribing provider.  Therapeutic Interventions: 1 on 1 counseling sessions, Psychoeducation, Medication administration, Evaluate responses to treatment, Monitor vital signs  and CBGs as ordered, Perform/monitor CIWA, COWS, AIMS and Fall Risk screenings as ordered, Perform wound care treatments as ordered.  Evaluation of Outcomes: Not Progressing   LCSW Treatment Plan for Primary Diagnosis: Bipolar affective disorder, current episode manic with psychotic symptoms (HCC) Long Term Goal(s): Safe transition to appropriate next level of care at discharge, Engage patient in therapeutic group addressing interpersonal concerns.  Short Term Goals: Engage patient in aftercare planning with referrals and resources, Increase social support, Increase ability to appropriately verbalize feelings, Increase emotional regulation, Facilitate acceptance of mental health diagnosis and concerns, Facilitate patient progression through stages of change regarding substance use  diagnoses and concerns, Identify triggers associated with mental health/substance abuse issues, and Increase skills for wellness and recovery  Therapeutic Interventions: Assess for all discharge needs, 1 to 1 time with Social worker, Explore available resources and support systems, Assess for adequacy in community support network, Educate family and significant other(s) on suicide prevention, Complete Psychosocial Assessment, Interpersonal group therapy.  Evaluation of Outcomes: Not Progressing   Progress in Treatment: Attending groups: Yes. Participating in groups: Yes. Taking medication as prescribed: Yes. Toleration medication: Yes. Family/Significant other contact made: Yes, individual(s) contacted:  [name] (mom) [phone] Patient understands diagnosis: No. Discussing patient identified problems/goals with staff: Yes. Medical problems stabilized or resolved: Yes. Denies suicidal/homicidal ideation: Yes. Issues/concerns per patient self-inventory: No.   New problem(s) identified: No, Describe:  None Reported   New Short Term/Long Term Goal(s):medication stabilization, elimination of SI thoughts, development of comprehensive mental wellness plan.    Patient Goals:  " Positive attitude, stay strong and be the best "   Discharge Plan or Barriers: Patient recently admitted. CSW will continue to follow and assess for appropriate referrals and possible discharge planning.    Reason for Continuation of Hospitalization: Delusions  Hallucinations Mania Medication stabilization Withdrawal symptoms  Estimated Length of Stay: 5-7 days   Last 3 [place] Suicide Severity Risk Score: Flowsheet Row Ad[facility] (Current) [facility] from 09/10/2022 in [facility] ED from 08/17/2022 in [facility] RISK CATEGORY No Risk No Risk No Risk       Last PHQ 2/9 Scores:     05/24/2022   11:33 AM 08/25/2020    5:33 PM 08/20/2020    9:13 AM  Depression screen PHQ 2/9  Decreased Interest 0 0 0  Down, Depressed, Hopeless 0 0 0  PHQ - 2 Score 0 0 0  Altered sleeping 0  0  Tired, decreased energy 0  0  Change in appetite 0  0  Feeling bad or failure about yourself  0  0  Trouble concentrating 0  0  Moving slowly or fidgety/restless 0  0  Suicidal thoughts 0  0  PHQ-9 Score 0  0  Difficult doing work/chores Not difficult at all      Scribe for Treatment Team: Beather Arbour 09/13/2022 2:57 PM

## 2022-09-13 NOTE — Progress Notes (Signed)
  Patient made aware of vitamin B injection during medication administration.  Per patient, she is going to bed immediately after taking her medications.  Vitamin B injection not given due to an insufficient amount of time, for this writer, to safely monitor patient for any reactions related to the injection while awake.

## 2022-09-13 NOTE — Progress Notes (Signed)
Pt visible in milieu majority of this shift. Denies SI, HI, AVH and pain when assessed "I'm good right now". Reported she slept well with good appetite. However, pt noted to be preoccupied, delusional, paranoid, hyper-religious and circumstantial on interactions. Pt believes she's pregnant "You know these men will just get you pregnant and abandoned you, like he did to me. I know my mom is also lying to me about my real dad, she got pregnant in [place] then [place] and came down here with this man. Now God is revealing everything to me. Even my son's father, he's not the father, I went with this guy before him, my son looks just like him. Now both of them agreed for this one to be my baby daddy". Observed pacing hall with inappropriate laughter, giggling and whispering to self. Remains medication compliant without adverse drug reactions. Emotional support, encouragement and reassurance offered. Safety checks maintained at Q 15 minutes intervals without incident. Pt attended scheduled groups, off unit for meals and courtyard break; returned without issues. Denies concerns at this time.

## 2022-09-13 NOTE — Progress Notes (Signed)
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  Patient's most recent admission to this hospital prior to this one was on 07/18/2022.  Patient was transferred at this time for treatment and stabilization of her symptoms.   Daily notes: Cheryl Cohen is seen her room. Chart reviewed. The chart findings discussed with the treatment team. She presents alert, oriented & seems to be aware of situation. She is visible on the unit, attending group sessions. She presents with a good affect, good eye contact & verbally responsive. She reports, "I came back here because I got into an argument with my mother again. I felt antagonized & unsupported by her. My mother was all in my face & in my case whenever me & my child's father had an argument. My child's father calls me bad names & my mother supported him. And I told my mother that I'm not taking it. I stopped taking my medicines for two days because I had a lot going on. I did not stop taking my medicines on purpose. But I have re-started it here. I'm feeling good since coming here. I have no anxiety or depression. I slept well last night. I have no complaint today". Cheryl Cohen currently denies any SIHI, AVH, delusional thoughts or paranoia. She does not appear to be responding to any internal stimuli. She did not bring up any pregnancy discussion during this follow-up evaluation. She focused the entire time on her relationship with her mother. There are no behavioral issues reported by staff. Her Abilify is increased from 10 mg to 20 mg. Some attempts were made to reach out to the patient's outpatient provider to inquire the reason for  switching her medications that she was discharged on in April, the phone stayed busy throughout the times these calls were made. Will try this provider again. Discussed this case with attending psychiatrist. See the treatment plan below.   Principal Problem: Bipolar affective disorder, current episode manic with psychotic symptoms (HCC)  Diagnosis: Principal Problem:   Bipolar affective disorder, current episode manic with psychotic symptoms (HCC) Active Problems:   Generalized anxiety disorder   Insomnia  Total Time spent with patient:  35 minutes.  Past Psychiatric History: See H&P.  Past Medical History:  Past Medical History:  Diagnosis Date   Anxiety    Bipolar 1 disorder (HCC)    hospitalized  3 x onset rx in 9th grade    Depression    Gestational diabetes    Gonorrhea    Hx of varicella    Syphilis    Twin pregnancy, delivered vaginally, IUFD stillborns 09/09/2015   IUFD  22 + weeks  still borns     Past Surgical History:  Procedure Laterality Date   DILATION AND EVACUATION N/A 09/09/2015   Procedure: DILATATION AND EVACUATION;  Surgeon: [name], MD;  Location: [facility];  Service: Gynecology;  Laterality: N/A;   TYMPANOSTOMY TUBE PLACEMENT     asage 2-3 yrs   Family History:  Family History  Problem Relation Age of Onset   Diabetes Mother    Arthritis Mother    Hypertension Father    Alcohol abuse Father  Alcoholism Father    Family Psychiatric  History: See H&P.  Social History:  Social History   Substance and Sexual Activity  Alcohol Use Yes     Social History   Substance and Sexual Activity  Drug Use Yes   Types: Marijuana    Social History   Socioeconomic History   Marital status: Single    Spouse name: Not on file   Number of children: Not on file   Years of education: Not on file   Highest education level: Some college, no degree  Occupational History    Comment: Geophysicist/field seismologist on [employer]  Tobacco Use    Smoking status: Every Day    Types: Cigars   Smokeless tobacco: Former   Tobacco comments:    1 cigarette a day  Vaping Use   Vaping Use: Never used  Substance and Sexual Activity   Alcohol use: Yes   Drug use: Yes    Types: Marijuana   Sexual activity: Yes    Birth control/protection: None  Other Topics Concern   Not on file  Social History Narrative   hh of 4    Lives at home  [place]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  In past    Worked also [employer]   Pet dog   Neg tad some caffiene       Now living at home  Had been with vine   Social Determinants of Health   Financial Resource Strain: Not on file  Food Insecurity: No Food Insecurity (09/11/2022)   Hunger Vital Sign    Worried About Running Out of Food in the Last Year: Never true    Ran Out of Food in the Last Year: Never true  Transportation Needs: Patient Declined (09/11/2022)   PRAPARE - Administrator, Civil Service (Medical): Patient declined    Lack of Transportation (Non-Medical): Patient declined  Physical Activity: Not on file  Stress: Not on file  Social Connections: Not on file   Additional Social History:   Sleep: Good  Appetite:  Good  Current Medications: Current Facility-Administered Medications  Medication Dose Route Frequency Provider Last Rate Last Admin   alum & mag hydroxide-simeth (MAALOX/MYLANTA) 200-200-20 MG/5ML suspension 30 mL  30 mL Oral Q4H PRN Rankin, Shuvon B, NP       ARIPiprazole (ABILIFY) tablet 20 mg  20 mg Oral QHS Nathifa Ritthaler I, NP       benzocaine (ORAJEL) 10 % mucosal gel   Mouth/Throat BID PRN Onuoha, Chinwendu V, NP       diphenhydrAMINE (BENADRYL) capsule 50 mg  50 mg Oral TID PRN Rankin, Shuvon B, NP       Or   diphenhydrAMINE (BENADRYL) injection 50 mg  50 mg Intramuscular TID PRN Rankin, Shuvon B, NP       hydrOXYzine (ATARAX) tablet 25 mg  25 mg Oral TID PRN Rankin, Shuvon B, NP   25 mg at 09/11/22 2047   ibuprofen (ADVIL) tablet 200 mg  200 mg Oral Q6H PRN  Onuoha, Chinwendu V, NP   200 mg at 09/13/22 9604   lithium carbonate (ESKALITH) ER tablet 450 mg  450 mg Oral QHS Rankin, Shuvon B, NP   450 mg at 09/12/22 2106   loperamide (IMODIUM) capsule 2-4 mg  2-4 mg Oral PRN Starleen Blue, NP       LORazepam (ATIVAN) tablet 2 mg  2 mg Oral Q8H PRN Rankin, Shuvon B, NP  2 mg at 09/12/22 0230   magnesium hydroxide (MILK OF MAGNESIA) suspension 30 mL  30 mL Oral Daily PRN Rankin, Shuvon B, NP       multivitamin with minerals tablet 1 tablet  1 tablet Oral Daily Nkwenti, Doris, NP   1 tablet at 09/13/22 0800   OLANZapine (ZYPREXA) tablet 10 mg  10 mg Oral Daily PRN Rankin, Shuvon B, NP       ondansetron (ZOFRAN-ODT) disintegrating tablet 4 mg  4 mg Oral Q6H PRN Starleen Blue, NP       propranolol (INDERAL) tablet 10 mg  10 mg Oral BID Rankin, Shuvon B, NP   10 mg at 09/13/22 1710   thiamine (Vitamin B-1) tablet 100 mg  100 mg Oral Daily Nkwenti, Doris, NP   100 mg at 09/13/22 0800   thiamine (VITAMIN B1) injection 100 mg  100 mg Intramuscular Once Starleen Blue, NP       traZODone (DESYREL) tablet 50 mg  50 mg Oral QHS PRN Rankin, Shuvon B, NP   50 mg at 09/11/22 2047   Lab Results: No results found for this or any previous visit (from the past 48 hour(s)).  Blood Alcohol level:  Lab Results  Component Value Date   ETH <10 09/10/2022   ETH <10 07/28/2022   Metabolic Disorder Labs: Lab Results  Component Value Date   HGBA1C 5.2 09/10/2022   MPG 102.54 09/10/2022   MPG 108.28 07/17/2022   Lab Results  Component Value Date   PROLACTIN 92.6 (H) 02/22/2016   PROLACTIN  10/31/2009    35.4 (NOTE)     Reference Ranges:                 Female:                       2.1 -  17.1 ng/ml                 Female:   Pregnant          9.7 - 208.5 ng/mL                           Non Pregnant      2.8 -  29.2 ng/mL                           Post  Menopausal   1.8 -  20.3 ng/mL                     Lab Results  Component Value Date   CHOL 134 07/17/2022    TRIG 37 07/17/2022   HDL 70 07/17/2022   CHOLHDL 1.9 07/17/2022   VLDL 7 07/17/2022   LDLCALC 57 07/17/2022   LDLCALC 62 02/22/2016    Physical Findings: AIMS: Facial and Oral Movements Muscles of Facial Expression: None, normal Lips and Perioral Area: None, normal Jaw: None, normal Tongue: None, normal,Extremity Movements Upper (arms, wrists, hands, fingers): None, normal Lower (legs, knees, ankles, toes): None, normal, Trunk Movements Neck, shoulders, hips: None, normal, Overall Severity Severity of abnormal movements (highest score from questions above): None, normal Incapacitation due to abnormal movements: None, normal Patient's awareness of abnormal movements (rate only patient's report): No Awareness, Dental Status Current problems with teeth and/or dentures?: No Does patient usually wear dentures?: No  CIWA:  CIWA-Ar Total: 0 COWS:     Musculoskeletal:  Strength & Muscle Tone: within normal limits Gait & Station: normal Patient leans: N/A  Psychiatric Specialty Exam:  Presentation  General Appearance:  Disheveled  Eye Contact: Fair  Speech: Pressured  Speech Volume: Increased  Handedness: Right   Mood and Affect  Mood: Anxious; Irritable  Affect: Congruent   Thought Process  Thought Processes: Disorganized  Descriptions of Associations:Tangential  Orientation:Partial  Thought Content:Illogical  History of Schizophrenia/Schizoaffective disorder:No  Duration of Psychotic Symptoms:Greater than six months  Hallucinations:Hallucinations: Auditory; Visual  Ideas of Reference:Paranoia; Delusions; Percusatory  Suicidal Thoughts:Suicidal Thoughts: No  Homicidal Thoughts:Homicidal Thoughts: No  Sensorium  Memory: Immediate Good  Judgment: Poor  Insight: Poor   Executive Functions  Concentration: Poor  Attention Span: Poor  Recall: Poor  Fund of Knowledge: Poor  Language: Poor   Psychomotor Activity  Psychomotor  Activity: Psychomotor Activity: Normal   Assets  Assets: Communication Skills; Resilience   Sleep  Sleep: Sleep: Good Number of Hours of Sleep: 7.5  Physical Exam: Physical Exam Vitals and nursing note reviewed.  HENT:     Mouth/Throat:     Pharynx: Oropharynx is clear.  Eyes:     Pupils: Pupils are equal, round, and reactive to light.  Cardiovascular:     Rate and Rhythm: Normal rate.     Pulses: Normal pulses.  Pulmonary:     Effort: Pulmonary effort is normal.  Genitourinary:    Comments: Deferred Musculoskeletal:        General: Normal range of motion.     Cervical back: Normal range of motion.  Skin:    General: Skin is warm and dry.  Neurological:     General: No focal deficit present.     Mental Status: She is alert and oriented to person, place, and time.    Review of Systems  Constitutional:  Negative for chills, diaphoresis and fever.  HENT:  Negative for congestion and sore throat.   Eyes:  Negative for blurred vision.  Respiratory:  Negative for cough, shortness of breath and wheezing.   Cardiovascular:  Negative for chest pain and palpitations.  Gastrointestinal:  Negative for abdominal pain, constipation, diarrhea, heartburn, nausea and vomiting.  Musculoskeletal:  Negative for joint pain and myalgias.  Neurological:  Negative for dizziness, tingling, tremors, sensory change, speech change, focal weakness, seizures, loss of consciousness, weakness and headaches.  Endo/Heme/Allergies:        Allergies: Amoxicillin.   Blood pressure 104/71, pulse 97, temperature 98.3 F (36.8 C), temperature source Oral, resp. rate 18, height 5\' 8"  (1.727 m), weight 64.9 kg, last menstrual period 08/15/2022, SpO2 100 %, unknown if currently breastfeeding. Body mass index is 21.74 kg/m.  Treatment Plan Summary: Daily contact with patient to assess and evaluate symptoms and progress in treatment and Medication management.   Continue inpatient hospitalization.  Will  continue today 09/13/2022 plan as below except where it is noted.   Diagnoses Principal Problem:   Bipolar affective disorder, current episode manic with psychotic symptoms (HCC) Active Problems:   Generalized anxiety disorder   Insomnia   Medications were started by admitting provider as follows: -Increased Abilify to 20 mg daily for mood stabilization/psychosis -Continue Lithium 450 mg po Q hs for mood stabilization-Level to be drawn on 06/14 @  -Continue Inderal 10 mg twice daily for anxiety -Continue Ativan Detox Protocol: CIWAs Q 6 H While awake with Ativan 1 mg if >10.   PRNS -Hydroxyzine 25 mg 3 times daily as needed for anxiety -Trazodone 50 mg nightly as needed for  sleep -Ativan 2 mg as needed for agitation every 8 hours as needed -Zyprexa 10 mg daily as needed for agitation -Continue Tylenol 650 mg every 6 hours PRN for mild pain -Continue Maalox 30 mg every 4 hrs PRN for indigestion -Continue Milk of Magnesia as needed every 6 hrs for constipation   Discharge Planning: Social work and case management to assist with discharge planning and identification of hospital follow-up needs prior to discharge Estimated LOS: 5-7 days Discharge Concerns: Need to establish a safety plan; Medication compliance and effectiveness Discharge Goals: Return home with outpatient referrals for mental health follow-up including medication management/psychotherapy  Armandina Stammer, NP, pmhnp, fnp-bc. 09/13/2022, 5:15 PM

## 2022-09-13 NOTE — Group Note (Signed)
Recreation Therapy Group Note   Group Topic:Self-Esteem  Group Date: 09/13/2022 Start Time: 1005 End Time: 1050 Facilitators: Selma Mink-McCall, LRT,CTRS Location: [address] Dayroom   Goal Area(s) Addresses:  Patient will successfully identify positive attributes about themselves.   Patient will successfully identify how self esteem in important.     Group Description: Patient asked to create a personalized collage on their construction paper color of choice. Patients were told to make the collage relative to self esteem; any thing that highlights positive things about them is allowed to be on the paper. Patients were given multiple craft materials to complete this activity. Patients were told to hang the collage in their room to help them acknowledge their positives when they are not feeling positive about themselves.   Affect/Mood: Appropriate   Participation Level: Engaged   Participation Quality: Independent   Behavior: Appropriate   Speech/Thought Process: Focused   Insight: Good   Judgement: Good   Modes of Intervention: Art   Patient Response to Interventions:  Engaged   Education Outcome:  Acknowledges education   Clinical Observations/Individualized Feedback: Pt was engaged and focused on activity.  Pt was bright and seemed to be enjoying herself.  Pt highlighted her motherhood by showing a picture of a woman and son.  Pt expressed wanting to be married in the future and her son being a ring barer, [place], having a big house and her love of dogs.  Pt also stated she loves "delicious food" and being healthy.  Pt described herself as a "queen at heart and love".  Pt went on to say she may love girls in the future.  Lastly, pt expressed aliens were already here and an alien invasion in coming.     Plan: Continue to engage patient in RT group sessions 2-3x/week.   Lasheena Frieze-McCall, LRT,CTRS 09/13/2022 12:47 PM

## 2022-09-14 DIAGNOSIS — F312 Bipolar disorder, current episode manic severe with psychotic features: Secondary | ICD-10-CM | POA: Diagnosis not present

## 2022-09-14 LAB — PREGNANCY, URINE: Preg Test, Ur: NEGATIVE

## 2022-09-14 MED ORDER — ARIPIPRAZOLE ER 400 MG IM SRER
400.0000 mg | INTRAMUSCULAR | Status: DC
  Administered 2022-09-15: 400 mg via INTRAMUSCULAR
  Filled 2022-09-14: qty 2

## 2022-09-14 NOTE — Progress Notes (Signed)
   09/14/22 0724  15 Minute Checks  Location Hallway  Visual Appearance Calm  Behavior Composed  Sleep (Behavioral Health Patients Only)  Calculate sleep? (Click Yes once per 24 hr at 0600 safety check) Yes  Documented sleep last 24 hours 3.25

## 2022-09-14 NOTE — Progress Notes (Signed)
   09/14/22 1940  Psych Admission Type (Psych Patients Only)  Admission Status Involuntary  Psychosocial Assessment  Patient Complaints None  Eye Contact Fair  Facial Expression Animated  Affect Appropriate to circumstance  Speech Soft;Logical/coherent  Interaction Assertive  Motor Activity Restless  Appearance/Hygiene Unremarkable  Behavior Characteristics Cooperative  Mood Pleasant  Thought Process  Coherency Circumstantial  Content Preoccupation  Delusions None reported or observed  Perception Hallucinations  Hallucination None reported or observed  Judgment Impaired  Confusion None  Danger to Self  Current suicidal ideation? Denies  Danger to Others  Danger to Others None reported or observed   Progress note   D: Pt seen at nurse's station. Pt denies SI, HI, AVH. Pt rates pain  0/10. Pt rates anxiety  0/10 and depression  0/10. Pt is animated. Pt attends group and encourages others to attend as well. Pt did not sleep well last night. Pt said the best thing about her day was that she got to go outside and "feel the sun on my face." Pt is on CIWA but denies alcohol or benzo use. No signs or symptoms observed. No other concerns noted at this time.  A: Pt provided support and encouragement. Pt given scheduled medication as prescribed. PRNs as appropriate. Q15 min checks for safety.   R: Pt safe on the unit. Will continue to monitor.

## 2022-09-14 NOTE — Group Note (Signed)
Occupational Therapy Group Note  Group Topic: Sleep Hygiene  Group Date: 09/14/2022 Start Time: 1430 End Time: 1500 Facilitators: Ted Mcalpine, OT   Group Description: Group encouraged increased participation and engagement through topic focused on sleep hygiene. Patients reflected on the quality of sleep they typically receive and identified areas that need improvement. Group was given background information on sleep and sleep hygiene, including common sleep disorders. Group members also received information on how to improve one's sleep and introduced a sleep diary as a tool that can be utilized to track sleep quality over a length of time. Group session ended with patients identifying one or more strategies they could utilize or implement into their sleep routine in order to improve overall sleep quality.        Therapeutic Goal(s):  Identify one or more strategies to improve overall sleep hygiene  Identify one or more areas of sleep that are negatively impacted (sleep too much, too little, etc)     Participation Level: Engaged   Participation Quality: Independent   Behavior: Appropriate   Speech/Thought Process: Relevant   Affect/Mood: Appropriate   Insight: Fair   Judgement: Fair      Modes of Intervention: Education  Patient Response to Interventions:  Attentive   Plan: Continue to engage patient in OT groups 2 - 3x/week.  09/14/2022  Ted Mcalpine, OT   Kerrin Champagne, OT

## 2022-09-14 NOTE — Progress Notes (Addendum)
Pt c/o toothache right lower jaw area. Pain score 8/10. Pt given PRNs as appropriate and hot pack. Will continue to monitor.  2328 - Pt states pain score is now 0/10. Medication and heat application effective. Will continue to monitor.

## 2022-09-14 NOTE — [facility] Group Notes (Signed)
Adult Psychoeducational Group Note  Date:  09/14/2022 Time:  3:43 PM  Group Topic/Focus:  Goals Group:   The focus of this group is to help patients establish daily goals to achieve during treatment and discuss how the patient can incorporate goal setting into their daily lives to aide in recovery. Orientation:   The focus of this group is to educate the patient on the purpose and policies of crisis stabilization and provide a format to answer questions about their admission.  The group details unit policies and expectations of patients while admitted.  Participation Level:  Active  Participation Quality:  Appropriate  Affect:  Appropriate  Cognitive:  Appropriate  Insight: Appropriate  Engagement in Group:  Engaged  Modes of Intervention:  Discussion  Additional Comments:  Pt attended the goals group and remained appropriate and engaged throughout the duration of the group.   Sheran Lawless 09/14/2022, 3:43 PM

## 2022-09-14 NOTE — [facility] Group Notes (Signed)
[facility] Group Notes:  (Nursing/MHT/Case Management/Adjunct)  Date:  09/14/2022  Time:  9:38 PM  Type of Therapy:  Group Therapy  Participation Level:  Active  Participation Quality:  Appropriate  Affect:  Appropriate  Cognitive:  Appropriate  Insight:  Appropriate  Engagement in Group:  Engaged  Modes of Intervention:  Activity, Education, and Problem-solving  Summary of Progress/Problems:Music Therapy as a way to relieve anxiety and/or stress.  Cheryl Cohen 09/14/2022, 9:38 PM

## 2022-09-14 NOTE — Progress Notes (Signed)
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  Patient's most recent admission to this hospital prior to this one was on 07/18/2022.  Patient was transferred at this time for treatment and stabilization of her symptoms.   Daily notes: Cheryl Cohen is seen her room. Chart reviewed. The chart findings discussed with the treatment team. She presents alert, oriented & aware of situation. She is visible on the unit, attending group sessions. She presents with a good affect, good eye contact & verbally responsive. She reports, "I'm doing very well today. I'm trying to be resilient. I feel very confident in myself today. I slept well last night. I'm taking my medications. I have no side effects. I will describe my mood as joyous. I can see myself going home to be a better mom for my son. I talked to my mom today. It was a good conversation. She is leaving for a cruise trip this Saturday. I'm not feeling like I'm pregnant any more because I know I'm not". Cheryl Cohen is beginning to show improving mood. She presents civil & proper during this evaluation. There are no behavioral issues reported by staff. We have scheduled the injectable form of her Abilify to be administered tomorrow 09-15-22. After the injection, staff will monitor tolerance & for any side effects. If all is well, she will be discharged in the next few days. Lithium level to be drawn in am. Continue current plan of care as already in progress.  Principal Problem: Bipolar affective disorder, current episode manic with psychotic symptoms (HCC)  Diagnosis: Principal Problem:   Bipolar affective disorder, current  episode manic with psychotic symptoms (HCC) Active Problems:   Generalized anxiety disorder   Insomnia  Total Time spent with patient:  35 minutes.  Past Psychiatric History: See H&P.  Past Medical History:  Past Medical History:  Diagnosis Date   Anxiety    Bipolar 1 disorder (HCC)    hospitalized  3 x onset rx in 9th grade    Depression    Gestational diabetes    Gonorrhea    Hx of varicella    Syphilis    Twin pregnancy, delivered vaginally, IUFD stillborns 09/09/2015   IUFD  22 + weeks  still borns     Past Surgical History:  Procedure Laterality Date   DILATION AND EVACUATION N/A 09/09/2015   Procedure: DILATATION AND EVACUATION;  Surgeon: [name], MD;  Location: [facility];  Service: Gynecology;  Laterality: N/A;   TYMPANOSTOMY TUBE PLACEMENT     asage 2-3 yrs   Family History:  Family History  Problem Relation Age of Onset   Diabetes Mother    Arthritis Mother    Hypertension Father    Alcohol abuse Father    Alcoholism Father    Family Psychiatric  History: See H&P.  Social History:  Social History   Substance and Sexual Activity  Alcohol Use Yes     Social History   Substance and Sexual Activity  Drug Use Yes   Types: Marijuana    Social History   Socioeconomic History   Marital status: Single    Spouse name: Not on file  Number of children: Not on file   Years of education: Not on file   Highest education level: Some college, no degree  Occupational History    Comment: Geophysicist/field seismologist on [employer]  Tobacco Use   Smoking status: Every Day    Types: Cigars   Smokeless tobacco: Former   Tobacco comments:    1 cigarette a day  Vaping Use   Vaping Use: Never used  Substance and Sexual Activity   Alcohol use: Yes   Drug use: Yes    Types: Marijuana   Sexual activity: Yes    Birth control/protection: None  Other Topics Concern   Not on file  Social History Narrative   hh of 4    Lives at home  Reserve to [place] for  [employer] nursing  In past    Worked also [employer]   Pet dog   Neg tad some caffiene       Now living at home  Had been with vine   Social Determinants of Health   Financial Resource Strain: Not on file  Food Insecurity: No Food Insecurity (09/11/2022)   Hunger Vital Sign    Worried About Running Out of Food in the Last Year: Never true    Ran Out of Food in the Last Year: Never true  Transportation Needs: Patient Declined (09/11/2022)   PRAPARE - Administrator, Civil Service (Medical): Patient declined    Lack of Transportation (Non-Medical): Patient declined  Physical Activity: Not on file  Stress: Not on file  Social Connections: Not on file   Additional Social History:   Sleep: Good  Appetite:  Good  Current Medications: Current Facility-Administered Medications  Medication Dose Route Frequency Provider Last Rate Last Admin   alum & mag hydroxide-simeth (MAALOX/MYLANTA) 200-200-20 MG/5ML suspension 30 mL  30 mL Oral Q4H PRN Rankin, Shuvon B, NP       ARIPiprazole (ABILIFY) tablet 20 mg  20 mg Oral QHS Azjah Pardo I, NP   20 mg at 09/13/22 2039   [START ON 09/15/2022] ARIPiprazole ER (ABILIFY MAINTENA) injection 400 mg  400 mg Intramuscular Q28 days Chaunda Vandergriff I, NP       benzocaine (ORAJEL) 10 % mucosal gel   Mouth/Throat BID PRN Onuoha, Chinwendu V, NP       diphenhydrAMINE (BENADRYL) capsule 50 mg  50 mg Oral TID PRN Rankin, Shuvon B, NP       Or   diphenhydrAMINE (BENADRYL) injection 50 mg  50 mg Intramuscular TID PRN Rankin, Shuvon B, NP       hydrOXYzine (ATARAX) tablet 25 mg  25 mg Oral TID PRN Rankin, Shuvon B, NP   25 mg at 09/14/22 0134   ibuprofen (ADVIL) tablet 200 mg  200 mg Oral Q6H PRN Onuoha, Chinwendu V, NP   200 mg at 09/14/22 1648   lithium carbonate (ESKALITH) ER tablet 450 mg  450 mg Oral QHS Rankin, Shuvon B, NP   450 mg at 09/13/22 2038   loperamide (IMODIUM) capsule 2-4 mg  2-4 mg Oral PRN Starleen Blue, NP       LORazepam (ATIVAN)  tablet 2 mg  2 mg Oral Q8H PRN Rankin, Shuvon B, NP   2 mg at 09/12/22 0230   magnesium hydroxide (MILK OF MAGNESIA) suspension 30 mL  30 mL Oral Daily PRN Rankin, Shuvon B, NP       multivitamin with minerals tablet 1 tablet  1 tablet Oral Daily  Starleen Blue, NP   1 tablet at 09/14/22 0819   OLANZapine (ZYPREXA) tablet 10 mg  10 mg Oral Daily PRN Rankin, Shuvon B, NP       ondansetron (ZOFRAN-ODT) disintegrating tablet 4 mg  4 mg Oral Q6H PRN Starleen Blue, NP       propranolol (INDERAL) tablet 10 mg  10 mg Oral BID Rankin, Shuvon B, NP   10 mg at 09/14/22 1646   thiamine (Vitamin B-1) tablet 100 mg  100 mg Oral Daily Nkwenti, Doris, NP   100 mg at 09/14/22 1610   thiamine (VITAMIN B1) injection 100 mg  100 mg Intramuscular Once Starleen Blue, NP       traZODone (DESYREL) tablet 50 mg  50 mg Oral QHS PRN Rankin, Shuvon B, NP   50 mg at 09/14/22 0134   Lab Results:  Results for orders placed or performed during the hospital encounter of 09/11/22 (from the past 48 hour(s))  Pregnancy, urine     Status: None   Collection Time: 09/13/22  5:29 PM  Result Value Ref Range   Preg Test, Ur NEGATIVE NEGATIVE    Comment:        THE SENSITIVITY OF THIS METHODOLOGY IS >25 mIU/mL. Performed at [facility], [address], [place], [place] [address]     Blood Alcohol level:  Lab Results  Component Value Date   [facility] <10 09/10/2022   ETH <10 07/28/2022   Metabolic Disorder Labs: Lab Results  Component Value Date   HGBA1C 5.2 09/10/2022   MPG 102.54 09/10/2022   MPG 108.28 07/17/2022   Lab Results  Component Value Date   PROLACTIN 92.6 (H) 02/22/2016   PROLACTIN  10/31/2009    35.4 (NOTE)     Reference Ranges:                 Female:                       2.1 -  17.1 ng/ml                 Female:   Pregnant          9.7 - 208.5 ng/mL                           Non Pregnant      2.8 -  29.2 ng/mL                           Post  Menopausal   1.8 -  20.3 ng/mL                      Lab Results  Component Value Date   CHOL 134 07/17/2022   TRIG 37 07/17/2022   HDL 70 07/17/2022   CHOLHDL 1.9 07/17/2022   VLDL 7 07/17/2022   LDLCALC 57 07/17/2022   LDLCALC 62 02/22/2016    Physical Findings: AIMS: Facial and Oral Movements Muscles of Facial Expression: None, normal Lips and Perioral Area: None, normal Jaw: None, normal Tongue: None, normal,Extremity Movements Upper (arms, wrists, hands, fingers): None, normal Lower (legs, knees, ankles, toes): None, normal, Trunk Movements Neck, shoulders, hips: None, normal, Overall Severity Severity of abnormal movements (highest score from questions above): None, normal Incapacitation due to abnormal movements: None, normal Patient's awareness of abnormal movements (rate only patient's report): No Awareness, Dental  Status Current problems with teeth and/or dentures?: No Does patient usually wear dentures?: No  CIWA:  CIWA-Ar Total: 1 COWS:     Musculoskeletal: Strength & Muscle Tone: within normal limits Gait & Station: normal Patient leans: N/A  Psychiatric Specialty Exam:  Presentation  General Appearance:  Casual; Fairly Groomed  Eye Contact: Good  Speech: Clear and Coherent; Normal Rate  Speech Volume: Normal  Handedness: Right   Mood and Affect  Mood: -- (Improving)  Affect: Congruent   Thought Process  Thought Processes: Coherent; Goal Directed; Linear  Descriptions of Associations:Intact  Orientation:Full (Time, Place and Person)  Thought Content:Logical  History of Schizophrenia/Schizoaffective disorder:No  Duration of Psychotic Symptoms:N/A  Hallucinations:Hallucinations: None Description of Auditory Hallucinations: NA Description of Visual Hallucinations: NA   Ideas of Reference:None  Suicidal Thoughts:Suicidal Thoughts: No   Homicidal Thoughts:Homicidal Thoughts: No   Sensorium  Memory: Immediate Good; Recent Good; Remote Good  Judgment: --  (Improving)  Insight: Present   Executive Functions  Concentration: Good  Attention Span: Good  Recall: -- (Improving)  Fund of Knowledge: Fair  Language: Good   Psychomotor Activity  Psychomotor Activity: Psychomotor Activity: Normal    Assets  Assets: Communication Skills; Desire for Improvement; Financial Resources/Insurance; Housing; Social Support; Resilience; Physical Health   Sleep  Sleep: Sleep: Good Number of Hours of Sleep: 8   Physical Exam: Physical Exam Vitals and nursing note reviewed.  HENT:     Mouth/Throat:     Pharynx: Oropharynx is clear.  Eyes:     Pupils: Pupils are equal, round, and reactive to light.  Cardiovascular:     Rate and Rhythm: Normal rate.     Pulses: Normal pulses.  Pulmonary:     Effort: Pulmonary effort is normal.  Genitourinary:    Comments: Deferred Musculoskeletal:        General: Normal range of motion.     Cervical back: Normal range of motion.  Skin:    General: Skin is warm and dry.  Neurological:     General: No focal deficit present.     Mental Status: She is alert and oriented to person, place, and time.    Review of Systems  Constitutional:  Negative for chills, diaphoresis and fever.  HENT:  Negative for congestion and sore throat.   Eyes:  Negative for blurred vision.  Respiratory:  Negative for cough, shortness of breath and wheezing.   Cardiovascular:  Negative for chest pain and palpitations.  Gastrointestinal:  Negative for abdominal pain, constipation, diarrhea, heartburn, nausea and vomiting.  Musculoskeletal:  Negative for joint pain and myalgias.  Neurological:  Negative for dizziness, tingling, tremors, sensory change, speech change, focal weakness, seizures, loss of consciousness, weakness and headaches.  Endo/Heme/Allergies:        Allergies: Amoxicillin.   Blood pressure 104/64, pulse 90, temperature 98.1 F (36.7 C), temperature source Oral, resp. rate 18, height 5\' 8"  (1.727  m), weight 64.9 kg, last menstrual period 08/15/2022, SpO2 100 %, unknown if currently breastfeeding. Body mass index is 21.74 kg/m.  Treatment Plan Summary: Daily contact with patient to assess and evaluate symptoms and progress in treatment and Medication management.   Continue inpatient hospitalization.  Will continue today 09/14/2022 plan as below except where it is noted.   Diagnoses Principal Problem:   Bipolar affective disorder, current episode manic with psychotic symptoms (HCC) Active Problems:   Generalized anxiety disorder   Insomnia   Medications were started by admitting provider as follows: -Continue Abilify 20 mg daily for  mood stabilization/psychosis.  -Abilify maintenna 400 mg IM on 09-15-22 @ 0800. -Continue Lithium 450 mg po Q hs for mood stabilization-Level to be drawn on 06/14 @  -Continue Inderal 10 mg twice daily for anxiety -Continue Ativan Detox Protocol: CIWAs Q 6 H While awake with Ativan 1 mg if >10.   PRNS -Hydroxyzine 25 mg 3 times daily as needed for anxiety -Trazodone 50 mg nightly as needed for sleep. -Ativan 2 mg as needed for agitation every 8 hours as needed -Zyprexa 10 mg daily as needed for agitation -Continue Tylenol 650 mg every 6 hours PRN for mild pain -Continue Maalox 30 mg every 4 hrs PRN for indigestion -Continue Milk of Magnesia as needed every 6 hrs for constipation   Discharge Planning: Social work and case management to assist with discharge planning and identification of hospital follow-up needs prior to discharge Estimated LOS: 5-7 days Discharge Concerns: Need to establish a safety plan; Medication compliance and effectiveness Discharge Goals: Return home with outpatient referrals for mental health follow-up including medication management/psychotherapy  Armandina Stammer, NP, pmhnp, fnp-bc. 09/14/2022, 6:20 PMPatient ID: Aleda Grana, female   DOB: [birth year], 31 y.o.   MRN: [id number]

## 2022-09-14 NOTE — Group Note (Signed)
Recreation Therapy Group Note   Group Topic:Leisure Education  Group Date: 09/14/2022 Start Time: 1003 End Time: 1045 Facilitators: Jeri Jeanbaptiste-McCall, LRT,CTRS Location: [address] Dayroom   Goal Area(s) Addresses:  Patient will identify positive leisure activities for use post discharge. Patient will identify at least one positive benefit of participation in leisure activities.   Group Description: [employer]. Patients were placed in a medium size circle. Patients were to hit the beach ball, as if playing volleyball, to each other. LRT will keep time while patients keep the ball going for as long as possible without it coming to a complete stop. When the ball comes to a stop, the time will start over. LRT will also play music in the background with a summer feel.   Affect/Mood: Appropriate   Participation Level: Engaged   Participation Quality: Independent   Behavior: Appropriate   Speech/Thought Process: Focused   Insight: Good   Judgement: Good   Modes of Intervention: Cooperative Play   Patient Response to Interventions:  Engaged   Education Outcome:  Acknowledges education   Clinical Observations/Individualized Feedback: Pt was engaged and focused throughout group. Pt was social and interacted well with peers during activity. Pt was humming along with music during the activity. Pt also expressed the activity showed her that "no matter what you go through in life, keep going and don't stop".     Plan: Continue to engage patient in RT group sessions 2-3x/week.   Madina Galati-McCall, LRT,CTRS 09/14/2022 11:39 AM

## 2022-09-15 DIAGNOSIS — F312 Bipolar disorder, current episode manic severe with psychotic features: Secondary | ICD-10-CM | POA: Diagnosis not present

## 2022-09-15 LAB — LITHIUM LEVEL: Lithium Lvl: 0.17 mmol/L — ABNORMAL LOW (ref 0.60–1.20)

## 2022-09-15 NOTE — BHH Group Notes (Signed)
Spiritual care group facilitated by Chaplain Dyanne Carrel, [facility]   Group focused on topic of strength. Group members reflected on what thoughts and feelings emerge when they hear this topic. They then engaged in facilitated dialog around how strength is present in their lives. This dialog focused on representing what strength had been to them in their lives (images and patterns given) and what they saw as helpful in their life now (what they needed / wanted).  Activity drew on narrative framework.  Patient Progress: Cheryl Cohen attended group and actively attended and participated in group conversation and activities. She shared about some of the stressors in her life now, especially with her mom and her baby's father.

## 2022-09-15 NOTE — BHH Group Notes (Signed)
Adult Psychoeducational Group Note  Date:  09/15/2022 Time:  9:17 PM  Group Topic/Focus:  Wrap-Up Group:   The focus of this group is to help patients review their daily goal of treatment and discuss progress on daily workbooks.  Participation Level:  Active  Participation Quality:  Appropriate  Affect:  Appropriate  Cognitive:  Appropriate  Insight: Appropriate  Engagement in Group:  Engaged  Modes of Intervention:  Discussion  Additional Comments:   Pt states that she had a good day and learned to concur strength during group today. Pt spoke with her family on the phone and talked with her doctors. Pt denied everything and stated one of her goals was to stay on her medications and be a good mother when she leaves.  Vevelyn Pat 09/15/2022, 9:17 PM

## 2022-09-15 NOTE — Group Note (Unsigned)
Date:  09/15/2022 Time:  2:23 PM  Group Topic/Focus:  Goals Group:   The focus of this group is to help patients establish daily goals to achieve during treatment and discuss how the patient can incorporate goal setting into their daily lives to aide in recovery. Orientation:   The focus of this group is to educate the patient on the purpose and policies of crisis stabilization and provide a format to answer questions about their admission.  The group details unit policies and expectations of patients while admitted.     Participation Level:  {BHH PARTICIPATION ZOXWR:60454}  Participation Quality:  {BHH PARTICIPATION QUALITY:22265}  Affect:  {BHH AFFECT:22266}  Cognitive:  {BHH COGNITIVE:22267}  Insight: {BHH Insight2:20797}  Engagement in Group:  {BHH ENGAGEMENT IN GROUP:22268}  Modes of Intervention:  {BHH MODES OF INTERVENTION:22269}  Additional Comments:  ***  Alailah Safley W Talyssa Gibas 09/15/2022, 2:23 PM

## 2022-09-15 NOTE — Progress Notes (Signed)
Pt c/o pain 10/10 in tooth right lower jaw. Shooting pain. PRNs given per MAR. Heat applied. Will continue to monitor.

## 2022-09-15 NOTE — Progress Notes (Signed)
   09/15/22 2045  Psych Admission Type (Psych Patients Only)  Admission Status Involuntary  Psychosocial Assessment  Patient Complaints None  Eye Contact Fair  Facial Expression Animated  Affect Appropriate to circumstance  Speech Soft;Logical/coherent  Interaction Assertive  Motor Activity Fidgety  Appearance/Hygiene Unremarkable  Behavior Characteristics Cooperative  Mood Pleasant  Thought Process  Coherency WDL  Content WDL  Delusions None reported or observed  Perception Hallucinations  Hallucination None reported or observed  Judgment Limited  Confusion None  Danger to Self  Current suicidal ideation? Denies  Danger to Others  Danger to Others None reported or observed   Progress note   D: Pt seen in dayroom and on the phone. Pt denies SI, HI, AVH. Pt rates pain  0/10. Says her tooth is feeling better today. Pt rates anxiety  0/10 and depression  0/10. Pt says she went to the gym today. Reports group attendance, good sleep and appetite. No other concerns noted at this time.  A: Pt provided support and encouragement. Pt given scheduled medication as prescribed. PRNs as appropriate. Q15 min checks for safety.   R: Pt safe on the unit. Will continue to monitor.

## 2022-09-15 NOTE — Progress Notes (Signed)
Pt given a heat pack for her right jaw area. Experiencing some toothache discomfort. Not time for PRN pain medication.

## 2022-09-15 NOTE — Progress Notes (Signed)
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  Patient's most recent admission to this hospital prior to this one was on 07/18/2022.  Patient was transferred at this time for treatment and stabilization of her symptoms.   Daily notes: Cheryl Cohen is seen her room. Chart reviewed. The chart findings discussed with the treatment team. She presents alert, oriented & aware of situation. She is visible on the unit, attending group sessions. She presents with a good affect, good eye contact & verbally responsive. She reports, "I'm doing well. I had my shot today. I'm feeling a lot better. I feel liberated. I talked to my mother, she will be leaving for her cruise trip tomorrow. The next person you all can call is my grandmother Cheryl Cohen #[phone]". Cheryl Cohen currently denies any SIHI, AVH, delusional thoughts or paranoia. She does not appear to be responding to any internal stimuli. There are no changes made on her current plan of care. Contacted patient's mother Cheryl Cohen [phone] for an alternate person to contact on behalf of [name] since she will be going on a cruise. Patient's mother states that she has given all the information to the social worker earlier. Continue current plan of care as already in progress.  Principal Problem: Bipolar affective disorder, current episode manic with psychotic symptoms (HCC)  Diagnosis: Principal Problem:   Bipolar affective disorder, current episode manic with psychotic symptoms (HCC) Active Problems:   Generalized anxiety disorder   Insomnia  Total Time spent with patient:  25 minutes.  Past Psychiatric History: See  H&P.  Past Medical History:  Past Medical History:  Diagnosis Date   Anxiety    Bipolar 1 disorder (HCC)    hospitalized  3 x onset rx in 9th grade    Depression    Gestational diabetes    Gonorrhea    Hx of varicella    Syphilis    Twin pregnancy, delivered vaginally, IUFD stillborns 09/09/2015   IUFD  22 + weeks  still borns     Past Surgical History:  Procedure Laterality Date   DILATION AND EVACUATION N/A 09/09/2015   Procedure: DILATATION AND EVACUATION;  Surgeon: [name], MD;  Location: [facility];  Service: Gynecology;  Laterality: N/A;   TYMPANOSTOMY TUBE PLACEMENT     asage 2-3 yrs   Family History:  Family History  Problem Relation Age of Onset   Diabetes Mother    Arthritis Mother    Hypertension Father    Alcohol abuse Father    Alcoholism Father    Family Psychiatric  History: See H&P.  Social History:  Social History   Substance and Sexual Activity  Alcohol Use Yes     Social History   Substance and Sexual Activity  Drug Use Yes   Types: Marijuana    Social History   Socioeconomic History   Marital status: Single    Spouse name: Not on file   Number of children: Not on file   Years of education: Not on file   Highest education level: Some college, no degree  Occupational History    Comment: Geophysicist/field seismologist on [employer]  Tobacco  Use   Smoking status: Every Day    Types: Cigars   Smokeless tobacco: Former   Tobacco comments:    1 cigarette a day  Vaping Use   Vaping Use: Never used  Substance and Sexual Activity   Alcohol use: Yes   Drug use: Yes    Types: Marijuana   Sexual activity: Yes    Birth control/protection: None  Other Topics Concern   Not on file  Social History Narrative   hh of 4    Lives at home  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  In past    Worked also [employer]   Pet dog   Neg tad some caffiene       Now living at home  Had been with vine   Social Determinants of Health   Financial  Resource Strain: Not on file  Food Insecurity: No Food Insecurity (09/11/2022)   Hunger Vital Sign    Worried About Running Out of Food in the Last Year: Never true    Ran Out of Food in the Last Year: Never true  Transportation Needs: Patient Declined (09/11/2022)   PRAPARE - Administrator, Civil Service (Medical): Patient declined    Lack of Transportation (Non-Medical): Patient declined  Physical Activity: Not on file  Stress: Not on file  Social Connections: Not on file   Additional Social History:   Sleep: Good  Appetite:  Good  Current Medications: Current Facility-Administered Medications  Medication Dose Route Frequency Provider Last Rate Last Admin   alum & mag hydroxide-simeth (MAALOX/MYLANTA) 200-200-20 MG/5ML suspension 30 mL  30 mL Oral Q4H PRN Rankin, Shuvon B, NP       ARIPiprazole (ABILIFY) tablet 20 mg  20 mg Oral QHS Manuel Dall I, NP   20 mg at 09/14/22 2036   ARIPiprazole ER (ABILIFY MAINTENA) injection 400 mg  400 mg Intramuscular Q28 days Armandina Stammer I, NP   400 mg at 09/15/22 0848   benzocaine (ORAJEL) 10 % mucosal gel   Mouth/Throat BID PRN Onuoha, Chinwendu V, NP   1 Application at 09/15/22 1445   diphenhydrAMINE (BENADRYL) capsule 50 mg  50 mg Oral TID PRN Rankin, Shuvon B, NP       Or   diphenhydrAMINE (BENADRYL) injection 50 mg  50 mg Intramuscular TID PRN Rankin, Shuvon B, NP       hydrOXYzine (ATARAX) tablet 25 mg  25 mg Oral TID PRN Rankin, Shuvon B, NP   25 mg at 09/14/22 0134   ibuprofen (ADVIL) tablet 200 mg  200 mg Oral Q6H PRN Onuoha, Chinwendu V, NP   200 mg at 09/15/22 1721   lithium carbonate (ESKALITH) ER tablet 450 mg  450 mg Oral QHS Rankin, Shuvon B, NP   450 mg at 09/14/22 2036   loperamide (IMODIUM) capsule 2-4 mg  2-4 mg Oral PRN Starleen Blue, NP       LORazepam (ATIVAN) tablet 2 mg  2 mg Oral Q8H PRN Rankin, Shuvon B, NP   2 mg at 09/12/22 0230   magnesium hydroxide (MILK OF MAGNESIA) suspension 30 mL  30 mL Oral Daily PRN  Rankin, Shuvon B, NP       multivitamin with minerals tablet 1 tablet  1 tablet Oral Daily Nkwenti, Doris, NP   1 tablet at 09/15/22 0836   OLANZapine (ZYPREXA) tablet 10 mg  10 mg Oral Daily PRN Rankin, Shuvon B, NP       ondansetron (ZOFRAN-ODT) disintegrating  tablet 4 mg  4 mg Oral Q6H PRN Starleen Blue, NP       propranolol (INDERAL) tablet 10 mg  10 mg Oral BID Rankin, Shuvon B, NP   10 mg at 09/15/22 1721   thiamine (Vitamin B-1) tablet 100 mg  100 mg Oral Daily Nkwenti, Doris, NP   100 mg at 09/15/22 1610   thiamine (VITAMIN B1) injection 100 mg  100 mg Intramuscular Once Starleen Blue, NP       traZODone (DESYREL) tablet 50 mg  50 mg Oral QHS PRN Rankin, Shuvon B, NP   50 mg at 09/14/22 2036   Lab Results:  No results found for this or any previous visit (from the past 48 hour(s)).   Blood Alcohol level:  Lab Results  Component Value Date   ETH <10 09/10/2022   ETH <10 07/28/2022   Metabolic Disorder Labs: Lab Results  Component Value Date   HGBA1C 5.2 09/10/2022   MPG 102.54 09/10/2022   MPG 108.28 07/17/2022   Lab Results  Component Value Date   PROLACTIN 92.6 (H) 02/22/2016   PROLACTIN  10/31/2009    35.4 (NOTE)     Reference Ranges:                 Female:                       2.1 -  17.1 ng/ml                 Female:   Pregnant          9.7 - 208.5 ng/mL                           Non Pregnant      2.8 -  29.2 ng/mL                           Post  Menopausal   1.8 -  20.3 ng/mL                     Lab Results  Component Value Date   CHOL 134 07/17/2022   TRIG 37 07/17/2022   HDL 70 07/17/2022   CHOLHDL 1.9 07/17/2022   VLDL 7 07/17/2022   LDLCALC 57 07/17/2022   LDLCALC 62 02/22/2016    Physical Findings: AIMS: Facial and Oral Movements Muscles of Facial Expression: None, normal Lips and Perioral Area: None, normal Jaw: None, normal Tongue: None, normal,Extremity Movements Upper (arms, wrists, hands, fingers): None, normal Lower (legs, knees, ankles,  toes): None, normal, Trunk Movements Neck, shoulders, hips: None, normal, Overall Severity Severity of abnormal movements (highest score from questions above): None, normal Incapacitation due to abnormal movements: None, normal Patient's awareness of abnormal movements (rate only patient's report): No Awareness, Dental Status Current problems with teeth and/or dentures?: No Does patient usually wear dentures?: No  CIWA:  CIWA-Ar Total: 0 COWS:     Musculoskeletal: Strength & Muscle Tone: within normal limits Gait & Station: normal Patient leans: N/A  Psychiatric Specialty Exam:  Presentation  General Appearance:  Casual; Fairly Groomed  Eye Contact: Good  Speech: Clear and Coherent; Normal Rate  Speech Volume: Normal  Handedness: Right   Mood and Affect  Mood: -- (Improving)  Affect: Congruent   Thought Process  Thought Processes: Coherent; Goal Directed; Linear  Descriptions of Associations:Intact  Orientation:Full (Time, Place  and Person)  Thought Content:Logical  History of Schizophrenia/Schizoaffective disorder:No  Duration of Psychotic Symptoms:N/A  Hallucinations:Hallucinations: None Description of Auditory Hallucinations: NA Description of Visual Hallucinations: NA   Ideas of Reference:None  Suicidal Thoughts:Suicidal Thoughts: No   Homicidal Thoughts:Homicidal Thoughts: No   Sensorium  Memory: Immediate Good; Recent Good; Remote Good  Judgment: -- (Improving)  Insight: Present   Executive Functions  Concentration: Good  Attention Span: Good  Recall: -- (Improving)  Fund of Knowledge: Fair  Language: Good   Psychomotor Activity  Psychomotor Activity: Psychomotor Activity: Normal   Assets  Assets: Communication Skills; Desire for Improvement; Financial Resources/Insurance; Housing; Social Support; Resilience; Physical Health   Sleep  Sleep: Sleep: Good Number of Hours of Sleep: 8  Physical  Exam: Physical Exam Vitals and nursing note reviewed.  HENT:     Mouth/Throat:     Pharynx: Oropharynx is clear.  Eyes:     Pupils: Pupils are equal, round, and reactive to light.  Cardiovascular:     Rate and Rhythm: Normal rate.     Pulses: Normal pulses.  Pulmonary:     Effort: Pulmonary effort is normal.  Genitourinary:    Comments: Deferred Musculoskeletal:        General: Normal range of motion.     Cervical back: Normal range of motion.  Skin:    General: Skin is warm and dry.  Neurological:     General: No focal deficit present.     Mental Status: She is alert and oriented to person, place, and time.    Review of Systems  Constitutional:  Negative for chills, diaphoresis and fever.  HENT:  Negative for congestion and sore throat.   Eyes:  Negative for blurred vision.  Respiratory:  Negative for cough, shortness of breath and wheezing.   Cardiovascular:  Negative for chest pain and palpitations.  Gastrointestinal:  Negative for abdominal pain, constipation, diarrhea, heartburn, nausea and vomiting.  Musculoskeletal:  Negative for joint pain and myalgias.  Neurological:  Negative for dizziness, tingling, tremors, sensory change, speech change, focal weakness, seizures, loss of consciousness, weakness and headaches.  Endo/Heme/Allergies:        Allergies: Amoxicillin.   Blood pressure 105/70, pulse 87, temperature 98.2 F (36.8 C), temperature source Oral, resp. rate 18, height 5\' 8"  (1.727 m), weight 64.9 kg, last menstrual period 08/15/2022, SpO2 100 %, unknown if currently breastfeeding. Body mass index is 21.74 kg/m.  Treatment Plan Summary: Daily contact with patient to assess and evaluate symptoms and progress in treatment and Medication management.   Continue inpatient hospitalization.  Will continue today 09/15/2022 plan as below except where it is noted.   Diagnoses Principal Problem:   Bipolar affective disorder, current episode manic with psychotic  symptoms (HCC) Active Problems:   Generalized anxiety disorder   Insomnia   Medications were started by admitting provider as follows: -Continue Abilify 20 mg daily for mood stabilization/psychosis.  -Completed) Abilify maintenna 400 mg IM on 09-15-22 @ 0800 (Completed. -Continue Lithium 450 mg po Q hs for mood stabilization-Level to be drawn on 06/14 @  -Continue Inderal 10 mg twice daily for anxiety -Continue Ativan Detox Protocol: CIWAs Q 6 H While awake with Ativan 1 mg if >10.   PRNS -Hydroxyzine 25 mg 3 times daily as needed for anxiety -Trazodone 50 mg nightly as needed for sleep. -Ativan 2 mg as needed for agitation every 8 hours as needed -Zyprexa 10 mg daily as needed for agitation -Continue Tylenol 650 mg every 6 hours PRN  for mild pain -Continue Maalox 30 mg every 4 hrs PRN for indigestion -Continue Milk of Magnesia as needed every 6 hrs for constipation   Discharge Planning: Social work and case management to assist with discharge planning and identification of hospital follow-up needs prior to discharge Estimated LOS: 5-7 days Discharge Concerns: Need to establish a safety plan; Medication compliance and effectiveness Discharge Goals: Return home with outpatient referrals for mental health follow-up including medication management/psychotherapy  Armandina Stammer, NP, pmhnp, fnp-bc. 09/15/2022, 6:05 PMPatient ID: Cheryl Cohen, female   DOB: 11-08-1990, 31 y.o.   MRN: [id number] Patient ID: Cheryl Cohen, female   DOB: 1990/12/24, 32 y.o.   MRN: [id number]

## 2022-09-16 DIAGNOSIS — F312 Bipolar disorder, current episode manic severe with psychotic features: Secondary | ICD-10-CM | POA: Diagnosis not present

## 2022-09-16 NOTE — Progress Notes (Signed)
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  Patient's most recent admission to this hospital prior to this one was on 07/18/2022.  Patient was transferred at this time for treatment and stabilization of her symptoms.   Daily notes: Cheryl Cohen is seen her room. Chart reviewed. The chart findings discussed with the treatment team. She presents alert, oriented & aware of situation. She is visible on the unit, attending group sessions. She presents with a good affect, good eye contact & verbally responsive. She reports, "I'm doing very well. I have no complaint today. I got my shot yesterday. I'm doing well on it. I had my blood drawn last evening. I slept well last night". Cheryl Cohen currently denies any SIHI, AVH, delusional thoughts or paranoia. She does not appear to be responding to any internal stimuli. She is scheduled for discharge tomorrow morning. Reviewed vital signs, stable. Lithium level 0.17. Continue current plan of care as already in progress.  Contacted patient's mother Cheryl Cohen [phone] yesterday for an alternate person to contact on behalf of Cheryl Cohen since she will be going on a cruise. Patient's mother states that she has given all the information to the social worker earlier. Continue current plan of care as already in progress.  Principal Problem: Bipolar affective disorder, current episode manic with psychotic symptoms (HCC)  Diagnosis: Principal Problem:   Bipolar affective disorder, current episode manic with psychotic symptoms (HCC) Active Problems:   Generalized anxiety disorder   Insomnia  Total Time spent with patient:  25  minutes.  Past Psychiatric History: See H&P.  Past Medical History:  Past Medical History:  Diagnosis Date   Anxiety    Bipolar 1 disorder (HCC)    hospitalized  3 x onset rx in 9th grade    Depression    Gestational diabetes    Gonorrhea    Hx of varicella    Syphilis    Twin pregnancy, delivered vaginally, IUFD stillborns 09/09/2015   IUFD  22 + weeks  still borns     Past Surgical History:  Procedure Laterality Date   DILATION AND EVACUATION N/A 09/09/2015   Procedure: DILATATION AND EVACUATION;  Surgeon: [name], MD;  Location: [facility];  Service: Gynecology;  Laterality: N/A;   TYMPANOSTOMY TUBE PLACEMENT     asage 2-3 yrs   Family History:  Family History  Problem Relation Age of Onset   Diabetes Mother    Arthritis Mother    Hypertension Father    Alcohol abuse Father    Alcoholism Father    Family Psychiatric  History: See H&P.  Social History:  Social History   Substance and Sexual Activity  Alcohol Use Yes     Social History   Substance and Sexual Activity  Drug Use Yes   Types: Marijuana    Social History   Socioeconomic History   Marital status: Single    Spouse name: Not on file   Number of children: Not on file   Years of education: Not on file   Highest education level: Some college, no degree  Occupational History    Comment: Geophysicist/field seismologist on [employer]  Tobacco Use   Smoking status: Every Day    Types: Cigars   Smokeless tobacco: Former   Tobacco comments:    1 cigarette a day  Vaping Use   Vaping Use: Never used  Substance and Sexual Activity   Alcohol use: Yes   Drug use: Yes    Types: Marijuana   Sexual activity: Yes    Birth control/protection: None  Other Topics Concern   Not on file  Social History Narrative   hh of 4    Lives at home  [place] 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  In past    Worked also [employer]   Pet dog   Neg tad some caffiene       Now living at home  Had been with vine   Social  Determinants of Health   Financial Resource Strain: Not on file  Food Insecurity: No Food Insecurity (09/11/2022)   Hunger Vital Sign    Worried About Running Out of Food in the Last Year: Never true    Ran Out of Food in the Last Year: Never true  Transportation Needs: Patient Declined (09/11/2022)   PRAPARE - Administrator, Civil Service (Medical): Patient declined    Lack of Transportation (Non-Medical): Patient declined  Physical Activity: Not on file  Stress: Not on file  Social Connections: Not on file   Additional Social History:   Sleep: Good  Appetite:  Good  Current Medications: Current Facility-Administered Medications  Medication Dose Route Frequency Provider Last Rate Last Admin   alum & mag hydroxide-simeth (MAALOX/MYLANTA) 200-200-20 MG/5ML suspension 30 mL  30 mL Oral Q4H PRN Rankin, Shuvon B, NP       ARIPiprazole (ABILIFY) tablet 20 mg  20 mg Oral QHS Challis Crill I, NP   20 mg at 09/15/22 2043   ARIPiprazole ER (ABILIFY MAINTENA) injection 400 mg  400 mg Intramuscular Q28 days Armandina Stammer I, NP   400 mg at 09/15/22 0848   benzocaine (ORAJEL) 10 % mucosal gel   Mouth/Throat BID PRN Onuoha, Chinwendu V, NP   1 Application at 09/15/22 1445   diphenhydrAMINE (BENADRYL) capsule 50 mg  50 mg Oral TID PRN Rankin, Shuvon B, NP       Or   diphenhydrAMINE (BENADRYL) injection 50 mg  50 mg Intramuscular TID PRN Rankin, Shuvon B, NP       hydrOXYzine (ATARAX) tablet 25 mg  25 mg Oral TID PRN Rankin, Shuvon B, NP   25 mg at 09/14/22 0134   ibuprofen (ADVIL) tablet 200 mg  200 mg Oral Q6H PRN Onuoha, Chinwendu V, NP   200 mg at 09/16/22 1417   lithium carbonate (ESKALITH) ER tablet 450 mg  450 mg Oral QHS Rankin, Shuvon B, NP   450 mg at 09/15/22 2044   LORazepam (ATIVAN) tablet 2 mg  2 mg Oral Q8H PRN Rankin, Shuvon B, NP   2 mg at 09/12/22 0230   magnesium hydroxide (MILK OF MAGNESIA) suspension 30 mL  30 mL Oral Daily PRN Rankin, Shuvon B, NP       multivitamin  with minerals tablet 1 tablet  1 tablet Oral Daily Nkwenti, Doris, NP   1 tablet at 09/16/22 0806   OLANZapine (ZYPREXA) tablet 10 mg  10 mg Oral Daily PRN Rankin, Shuvon B, NP       propranolol (INDERAL) tablet 10 mg  10 mg Oral BID Rankin, Shuvon B, NP   10 mg at 09/15/22 1721  thiamine (Vitamin B-1) tablet 100 mg  100 mg Oral Daily Nkwenti, Doris, NP   100 mg at 09/16/22 1610   thiamine (VITAMIN B1) injection 100 mg  100 mg Intramuscular Once Starleen Blue, NP       traZODone (DESYREL) tablet 50 mg  50 mg Oral QHS PRN Rankin, Shuvon B, NP   50 mg at 09/15/22 2043   Lab Results:  Results for orders placed or performed during the hospital encounter of 09/11/22 (from the past 48 hour(s))  Lithium level     Status: Abnormal   Collection Time: 09/15/22  6:21 PM  Result Value Ref Range   Lithium Lvl 0.17 (L) 0.60 - 1.20 mmol/L    Comment: Performed at [facility], [address] [place], [place] [address]     Blood Alcohol level:  Lab Results  Component Value Date   [facility] <10 09/10/2022   ETH <10 07/28/2022   Metabolic Disorder Labs: Lab Results  Component Value Date   HGBA1C 5.2 09/10/2022   MPG 102.54 09/10/2022   MPG 108.28 07/17/2022   Lab Results  Component Value Date   PROLACTIN 92.6 (H) 02/22/2016   PROLACTIN  10/31/2009    35.4 (NOTE)     Reference Ranges:                 Female:                       2.1 -  17.1 ng/ml                 Female:   Pregnant          9.7 - 208.5 ng/mL                           Non Pregnant      2.8 -  29.2 ng/mL                           Post  Menopausal   1.8 -  20.3 ng/mL                     Lab Results  Component Value Date   CHOL 134 07/17/2022   TRIG 37 07/17/2022   HDL 70 07/17/2022   CHOLHDL 1.9 07/17/2022   VLDL 7 07/17/2022   LDLCALC 57 07/17/2022   LDLCALC 62 02/22/2016    Physical Findings: AIMS: Facial and Oral Movements Muscles of Facial Expression: None, normal Lips and Perioral Area: None,  normal Jaw: None, normal Tongue: None, normal,Extremity Movements Upper (arms, wrists, hands, fingers): None, normal Lower (legs, knees, ankles, toes): None, normal, Trunk Movements Neck, shoulders, hips: None, normal, Overall Severity Severity of abnormal movements (highest score from questions above): None, normal Incapacitation due to abnormal movements: None, normal Patient's awareness of abnormal movements (rate only patient's report): No Awareness, Dental Status Current problems with teeth and/or dentures?: No Does patient usually wear dentures?: No  CIWA:  CIWA-Ar Total: 0 COWS:     Musculoskeletal: Strength & Muscle Tone: within normal limits Gait & Station: normal Patient leans: N/A  Psychiatric Specialty Exam:  Presentation  General Appearance:  Casual; Fairly Groomed  Eye Contact: Good  Speech: Clear and Coherent; Normal Rate  Speech Volume: Normal  Handedness: Right   Mood and Affect  Mood: -- (Improving)  Affect: Congruent   Thought Process  Thought  Processes: Coherent; Goal Directed; Linear  Descriptions of Associations:Intact  Orientation:Full (Time, Place and Person)  Thought Content:Logical  History of Schizophrenia/Schizoaffective disorder:No  Duration of Psychotic Symptoms:N/A  Hallucinations:No data recorded   Ideas of Reference:None  Suicidal Thoughts:No data recorded   Homicidal Thoughts:No data recorded   Sensorium  Memory: Immediate Good; Recent Good; Remote Good  Judgment: -- (Improving)  Insight: Present   Executive Functions  Concentration: Good  Attention Span: Good  Recall: -- (Improving)  Fund of Knowledge: Fair  Language: Good   Psychomotor Activity  Psychomotor Activity: No data recorded   Assets  Assets: Communication Skills; Desire for Improvement; Financial Resources/Insurance; Housing; Social Support; Resilience; Physical Health   Sleep  Sleep: No data  recorded  Physical Exam: Physical Exam Vitals and nursing note reviewed.  HENT:     Mouth/Throat:     Pharynx: Oropharynx is clear.  Eyes:     Pupils: Pupils are equal, round, and reactive to light.  Cardiovascular:     Rate and Rhythm: Normal rate.     Pulses: Normal pulses.  Pulmonary:     Effort: Pulmonary effort is normal.  Genitourinary:    Comments: Deferred Musculoskeletal:        General: Normal range of motion.     Cervical back: Normal range of motion.  Skin:    General: Skin is warm and dry.  Neurological:     General: No focal deficit present.     Mental Status: She is alert and oriented to person, place, and time.    Review of Systems  Constitutional:  Negative for chills, diaphoresis and fever.  HENT:  Negative for congestion and sore throat.   Eyes:  Negative for blurred vision.  Respiratory:  Negative for cough, shortness of breath and wheezing.   Cardiovascular:  Negative for chest pain and palpitations.  Gastrointestinal:  Negative for abdominal pain, constipation, diarrhea, heartburn, nausea and vomiting.  Musculoskeletal:  Negative for joint pain and myalgias.  Neurological:  Negative for dizziness, tingling, tremors, sensory change, speech change, focal weakness, seizures, loss of consciousness, weakness and headaches.  Endo/Heme/Allergies:        Allergies: Amoxicillin.   Blood pressure 96/69, pulse (!) 103, temperature 98.7 F (37.1 C), temperature source Oral, resp. rate 18, height 5\' 8"  (1.727 m), weight 64.9 kg, last menstrual period 08/15/2022, SpO2 99 %, unknown if currently breastfeeding. Body mass index is 21.74 kg/m.  Treatment Plan Summary: Daily contact with patient to assess and evaluate symptoms and progress in treatment and Medication management.   Continue inpatient hospitalization.  Will continue today 09/16/2022 plan as below except where it is noted.   Diagnoses Principal Problem:   Bipolar affective disorder, current episode  manic with psychotic symptoms (HCC) Active Problems:   Generalized anxiety disorder   Insomnia   Medications were started by admitting provider as follows: -Continue Abilify 20 mg daily for mood stabilization/psychosis.  -Completed) Abilify maintenna 400 mg IM on 09-15-22 @ 0800 (Completed. -Continue Lithium 450 mg po Q hs for mood stabilization-Level to be drawn on 06/14 @  -Continue Inderal 10 mg twice daily for anxiety -Continue Ativan Detox Protocol: CIWAs Q 6 H While awake with Ativan 1 mg if >10.   PRNS -Hydroxyzine 25 mg 3 times daily as needed for anxiety -Trazodone 50 mg nightly as needed for sleep. -Ativan 2 mg as needed for agitation every 8 hours as needed -Zyprexa 10 mg daily as needed for agitation -Continue Tylenol 650 mg every 6 hours PRN  for mild pain -Continue Maalox 30 mg every 4 hrs PRN for indigestion -Continue Milk of Magnesia as needed every 6 hrs for constipation   Discharge Planning: Social work and case management to assist with discharge planning and identification of hospital follow-up needs prior to discharge Estimated LOS: 5-7 days Discharge Concerns: Need to establish a safety plan; Medication compliance and effectiveness Discharge Goals: Return home with outpatient referrals for mental health follow-up including medication management/psychotherapy  Armandina Stammer, NP, pmhnp, fnp-bc. 09/16/2022, 4:28 PMPatient ID: Aleda Grana, female   DOB: April 20, 1990, 31 y.o.   MRN: [id number] Patient ID: Jakaia Ogan, female   DOB: November 24, 1990, 32 y.o.   MRN: [id number] Patient ID: Henlee Landeck, female   DOB: [birth year], 32 y.o.   MRN: [id number]

## 2022-09-16 NOTE — Progress Notes (Signed)
   09/16/22 0559  15 Minute Checks  Location Dayroom  Visual Appearance Calm  Behavior Composed  Sleep (Behavioral Health Patients Only)  Calculate sleep? (Click Yes once per 24 hr at 0600 safety check) Yes  Documented sleep last 24 hours 4

## 2022-09-16 NOTE — [facility] Group Notes (Signed)
Adult Psychoeducational Group Note  Date:  09/16/2022 Time:  8:57 PM  Group Topic/Focus:  Wrap-Up Group:   The focus of this group is to help patients review their daily goal of treatment and discuss progress on daily workbooks.  Participation Level:  Active  Participation Quality:  Appropriate  Affect:  Appropriate  Cognitive:  Appropriate  Insight: Appropriate  Engagement in Group:  Engaged  Modes of Intervention:  Discussion  Additional Comments:   Pt states that she had a good day and was able to participate in a lot of activities through out the day such as karaoke, volleyball in the gym, and being social on the unit. Pt is hopeful to stay positive and focused on treatment. Pt states she denied everything.   Cheryl Cohen 09/16/2022, 8:57 PM

## 2022-09-16 NOTE — Progress Notes (Signed)
Pt asking for pain medication because her tooth is still hurting. Pt not able to have another dose until 0500.

## 2022-09-16 NOTE — Progress Notes (Signed)
   09/16/22 2100  Psych Admission Type (Psych Patients Only)  Admission Status Involuntary  Psychosocial Assessment  Patient Complaints None  Eye Contact Fair  Facial Expression Animated  Affect Appropriate to circumstance  Speech Logical/coherent  Interaction Assertive  Motor Activity Fidgety  Appearance/Hygiene Unremarkable  Behavior Characteristics Cooperative  Mood Pleasant  Thought Process  Coherency WDL  Content WDL  Delusions None reported or observed  Perception WDL  Hallucination None reported or observed  Judgment Poor  Confusion None  Danger to Self  Current suicidal ideation? Denies  Agreement Not to Harm Self Yes  Description of Agreement verbal  Danger to Others  Danger to Others None reported or observed

## 2022-09-16 NOTE — Progress Notes (Signed)
   09/16/22 0912  Psych Admission Type (Psych Patients Only)  Admission Status Involuntary  Psychosocial Assessment  Patient Complaints None  Eye Contact Fair  Facial Expression Animated  Affect Appropriate to circumstance  Speech Logical/coherent  Interaction Assertive  Motor Activity Fidgety  Appearance/Hygiene Unremarkable  Behavior Characteristics Cooperative  Mood Pleasant  Thought Process  Coherency WDL  Content WDL  Delusions None reported or observed  Perception WDL  Hallucination None reported or observed  Judgment Limited  Confusion None  Danger to Self  Current suicidal ideation? Denies  Agreement Not to Harm Self Yes  Description of Agreement verbal  Danger to Others  Danger to Others None reported or observed

## 2022-09-16 NOTE — Plan of Care (Signed)
  Problem: Coping: Goal: Ability to verbalize frustrations and anger appropriately will improve Outcome: Progressing   Problem: Coping: Goal: Ability to demonstrate self-control will improve Outcome: Progressing   Problem: Physical Regulation: Goal: Ability to maintain clinical measurements within normal limits will improve Outcome: Progressing   Problem: Safety: Goal: Periods of time without injury will increase Outcome: Progressing   

## 2022-09-17 DIAGNOSIS — F312 Bipolar disorder, current episode manic severe with psychotic features: Secondary | ICD-10-CM | POA: Diagnosis not present

## 2022-09-17 MED ORDER — BENZOCAINE 10 % MT GEL: Freq: Two times a day (BID) | OROMUCOSAL | 0 refills | Status: DC | PRN

## 2022-09-17 MED ORDER — PROPRANOLOL HCL 10 MG PO TABS: 10.0000 mg | ORAL_TABLET | Freq: Two times a day (BID) | ORAL | 0 refills | Status: DC

## 2022-09-17 MED ORDER — TRAZODONE HCL 50 MG PO TABS: 50.0000 mg | ORAL_TABLET | Freq: Every evening | ORAL | 0 refills | Status: DC | PRN

## 2022-09-17 MED ORDER — HYDROXYZINE HCL 25 MG PO TABS: 25.0000 mg | ORAL_TABLET | Freq: Three times a day (TID) | ORAL | 0 refills | Status: DC | PRN

## 2022-09-17 MED ORDER — LITHIUM CARBONATE ER 450 MG PO TBCR: 450.0000 mg | EXTENDED_RELEASE_TABLET | Freq: Every day | ORAL | Status: DC

## 2022-09-17 MED ORDER — LITHIUM CARBONATE ER 450 MG PO TBCR: 450.0000 mg | EXTENDED_RELEASE_TABLET | Freq: Every day | ORAL | 0 refills | Status: DC

## 2022-09-17 MED ORDER — ARIPIPRAZOLE ER 400 MG IM SRER: 400.0000 mg | INTRAMUSCULAR | 0 refills | Status: DC

## 2022-09-17 MED ORDER — ARIPIPRAZOLE ER 400 MG IM SRER: 400.0000 mg | INTRAMUSCULAR | 0 refills | Status: AC

## 2022-09-17 MED ORDER — ARIPIPRAZOLE 20 MG PO TABS: 20.0000 mg | ORAL_TABLET | Freq: Every day | ORAL | 0 refills | Status: DC

## 2022-09-17 MED ORDER — ARIPIPRAZOLE 10 MG PO TABS: 20.0000 mg | ORAL_TABLET | Freq: Every day | ORAL | Status: DC

## 2022-09-17 NOTE — Discharge Summary (Signed)
Physician Discharge Summary Note  Patient:  Cheryl Cohen is an 32 y.o., female MRN:  [id number] DOB:  March 22, 1991 Patient phone:  [phone] (home)  Patient address:   [address] [place] [address],  Total Time spent with patient: 45 minutes  Date of Admission:  09/11/2022  Date of Discharge: 09/17/2022  Reason for Admission: Worsening psychosis & aggressive behaviors towards her family members in the context of nonmedication compliance and THC abuse.  Principal Problem: Bipolar affective disorder, current episode manic with psychotic symptoms [facility])  Discharge Diagnoses: Principal Problem:   Bipolar affective disorder, current episode manic with psychotic symptoms (HCC) Active Problems:   Generalized anxiety disorder   Insomnia  Past Psychiatric History: See H&P  Past Medical History:  Past Medical History:  Diagnosis Date   Anxiety    Bipolar 1 disorder (HCC)    hospitalized  3 x onset rx in 9th grade    Depression    Gestational diabetes    Gonorrhea    Hx of varicella    Syphilis    Twin pregnancy, delivered vaginally, IUFD stillborns 09/09/2015   IUFD  22 + weeks  still borns     Past Surgical History:  Procedure Laterality Date   DILATION AND EVACUATION N/A 09/09/2015   Procedure: DILATATION AND EVACUATION;  Surgeon: [name], MD;  Location: [facility];  Service: Gynecology;  Laterality: N/A;   TYMPANOSTOMY TUBE PLACEMENT     asage 2-3 yrs   Family History:  Family History  Problem Relation Age of Onset   Diabetes Mother    Arthritis Mother    Hypertension Father    Alcohol abuse Father    Alcoholism Father    Family Psychiatric  History: See H&P  Social History:  Social History   Substance and Sexual Activity  Alcohol Use Yes     Social History   Substance and Sexual Activity  Drug Use Yes   Types: Marijuana    Social History   Socioeconomic History   Marital status: Single    Spouse name: Not on file   Number of  children: Not on file   Years of education: Not on file   Highest education level: Some college, no degree  Occupational History    Comment: Geophysicist/field seismologist on [employer]  Tobacco Use   Smoking status: Every Day    Types: Cigars   Smokeless tobacco: Former   Tobacco comments:    1 cigarette a day  Vaping Use   Vaping Use: Never used  Substance and Sexual Activity   Alcohol use: Yes   Drug use: Yes    Types: Marijuana   Sexual activity: Yes    Birth control/protection: None  Other Topics Concern   Not on file  Social History Narrative   hh of 4    Lives at home  [place]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  In past    Worked also [employer]   Pet dog   Neg tad some caffiene       Now living at home  Had been with vine   Social Determinants of Health   Financial Resource Strain: Not on file  Food Insecurity: No Food Insecurity (09/11/2022)   Hunger Vital Sign    Worried About Running Out of Food in the Last Year: Never true    Ran Out of Food in the Last Year: Never true  Transportation Needs: Patient Declined (09/11/2022)   PRAPARE -  Administrator, Civil Service (Medical): Patient declined    Freight forwarder (Non-Medical): Patient declined  Physical Activity: Not on file  Stress: Not on file  Social Connections: Not on file   Hospital Course: (Per admission evaluation notes): 32 yo AAF with a prior mental health history of bipolar dd/o & THC abuse who presented to the [facility] health urgent care under IVC taken out by her family for psychosis & aggressive behaviors towards her family members in the context of nonmedication compliance and THC abuse.  Patient's most recent admission to this hospital was on 07/18/2022, discharged on 07-24-22.  Patient was transferred to the [facility] for mood stabilization treatments.   Prior to this discharge, Baili was seen & evaluated for mental health stability. The current laboratory findings were  reviewed (stable), nurses notes & vital signs were reviewed as well. There are no current mental health or medical issues that should prevent this discharge at this time. Patient is being discharged to continue mental health care/medication management as noted below.   After the above admission evaluation, Tawyna's presenting symptoms were noted. She was recommended for mood stabilization treatments by resumption of her previous treatment regimen. Her Lithium level on admission per lab result review was very low (0.06). It appeared that she was not adhering to taking her medications as recommended. She denies any having any issues such as side effects or adverse reactions from her treatment regimen that led to her not taking them. She was resumed, stabilized & discharged on the medications as listed on her discharge medication lists below. Besides the mood stabilization treatments, Kendra was also enrolled & participated in the group counseling sessions being offered & held on this unit. She learned coping skills. She presented no other significant pre-existing medical issues that required treatment other than minor tooth ache of which she was treated with orajel.  She tolerated her treatment regimen without any adverse effects or reactions reported. Tanishi's symptoms responded well to her treatment regimen warranting this discharge. Patient is also mentally/medically stable & agreeable to this discharge.  During the course of this hospitalization, Rimsha presented no instances of behavioral issues that required restraints or immediate intervention. Patient remained safe on the unit. There were no instances of self-harming behaviors. There were no threats to other patients/staff. Lizzy remained cooperative to her daily routines & in taking her treatment regimen as recommended by her treatment team. She participated in the group sessions and interacted with staff and other patients appropriately.   During the course of  this hospitalization, patient's symptoms responded well to her treatment regimen & her mood has improved. Envy currently presents mentally & medically stable to be discharged to continue mental health care & medication management on an outpatient as noted below. At this time of her hospital discharge, She presents alert, attentive, well related, pleasant, mood improved & currently presents euthymic. Her affect is appropriate & positively reactive, no thought disorder noted, no suicidal or self injurious ideations reported, no homicidal or violent ideations present, no hallucinations, no delusions, not internally preoccupied. She is future oriented. She denies any medication side effects, which we reviewed with her. She will continue further mental health care & medication management on an outpatient basis as noted below. She is provided with all the necessary information needed to make this appointment without problems. Mariea was able to engage in safety planning including plan to return to [facility] or contact emergency services if she feels unable to maintain her  own safety or the safety of others. Pt had no further questions, comments or concerns.  She left [facility] in no apparent distress with all personal belongings. Transportation per her grandparents.      Physical Findings: AIMS: 0 CIWA: 0 COWS: 0  Musculoskeletal: Strength & Muscle Tone: within normal limits Gait & Station: normal Patient leans: N/A  Psychiatric Specialty Exam:  Presentation  General Appearance:  Appropriate for Environment; Casual; Fairly Groomed  Eye Contact: Good  Speech: Clear and Coherent  Speech Volume: Normal  Handedness: Right  Mood and Affect  Mood: Euthymic  Affect: Appropriate; Congruent  Thought Process  Thought Processes: Coherent; Goal Directed; Linear  Descriptions of Associations:Intact  Orientation:Full (Time, Place and Person)  Thought Content:Logical  History of  Schizophrenia/Schizoaffective disorder:No  Duration of Psychotic Symptoms:N/A  Hallucinations:Hallucinations: None Description of Auditory Hallucinations: NA Description of Visual Hallucinations: NA  Ideas of Reference:None  Suicidal Thoughts:Suicidal Thoughts: No  Homicidal Thoughts:Homicidal Thoughts: No  Sensorium  Memory: Immediate Good; Recent Good; Remote Good  Judgment: Good  Insight: Good  Executive Functions  Concentration: Good  Attention Span: Good  Recall: Good  Fund of Knowledge: Fair  Language: Good  Psychomotor Activity  Psychomotor Activity: Psychomotor Activity: Normal  Assets  Assets: Communication Skills; Desire for Improvement; Financial Resources/Insurance; Housing; Physical Health; Resilience; Social Support  Sleep  Sleep: Sleep: Good Number of Hours of Sleep: 8  Physical Exam: Physical Exam Constitutional:      Appearance: Normal appearance.  HENT:     Head: Normocephalic.     Mouth/Throat:     Pharynx: Oropharynx is clear.  Cardiovascular:     Rate and Rhythm: Normal rate.     Pulses: Normal pulses.  Pulmonary:     Effort: Pulmonary effort is normal.  Genitourinary:    Comments: Deferred Musculoskeletal:        General: Normal range of motion.     Cervical back: Normal range of motion.  Skin:    General: Skin is warm and dry.  Neurological:     General: No focal deficit present.     Mental Status: She is alert and oriented to person, place, and time. Mental status is at baseline.    Review of Systems  Constitutional:  Negative for chills, diaphoresis and fever.  HENT:  Negative for congestion and sore throat.   Eyes:  Negative for blurred vision.  Respiratory:  Negative for cough, shortness of breath and wheezing.   Cardiovascular:  Negative for chest pain and palpitations.  Gastrointestinal:  Negative for abdominal pain, constipation, diarrhea, heartburn, nausea and vomiting.  Genitourinary:  Negative for  dysuria.  Musculoskeletal:  Negative for joint pain and myalgias.  Neurological:  Negative for dizziness, tingling, tremors, sensory change, speech change, focal weakness, seizures, loss of consciousness, weakness and headaches.  Endo/Heme/Allergies:        Allergies: Amoxicillin  Psychiatric/Behavioral:  Positive for substance abuse (Hx. THC use.). Negative for depression, hallucinations, memory loss and suicidal ideas. The patient is nervous/anxious (Stable upon discharge) and has insomnia (Hx of (stable on medication).).    Blood pressure 103/70, pulse (!) 102, temperature 98.3 F (36.8 C), temperature source Oral, resp. rate 16, height 5\' 8"  (1.727 m), weight 64.9 kg, last menstrual period 08/15/2022, SpO2 99 %, unknown if currently breastfeeding. Body mass index is 21.74 kg/m.   Social History   Tobacco Use  Smoking Status Every Day   Types: Cigars  Smokeless Tobacco Former  Tobacco Comments   1 cigarette a day  Tobacco Cessation:  N/A, patient does not currently use tobacco products  Blood Alcohol level:  Lab Results  Component Value Date   ETH <10 09/10/2022   ETH <10 07/28/2022   Metabolic Disorder Labs:  Lab Results  Component Value Date   HGBA1C 5.2 09/10/2022   MPG 102.54 09/10/2022   MPG 108.28 07/17/2022   Lab Results  Component Value Date   PROLACTIN 92.6 (H) 02/22/2016   PROLACTIN  10/31/2009    35.4 (NOTE)     Reference Ranges:                 Female:                       2.1 -  17.1 ng/ml                 Female:   Pregnant          9.7 - 208.5 ng/mL                           Non Pregnant      2.8 -  29.2 ng/mL                           Post  Menopausal   1.8 -  20.3 ng/mL                     Lab Results  Component Value Date   CHOL 134 07/17/2022   TRIG 37 07/17/2022   HDL 70 07/17/2022   CHOLHDL 1.9 07/17/2022   VLDL 7 07/17/2022   LDLCALC 57 07/17/2022   LDLCALC 62 02/22/2016    See Psychiatric Specialty Exam and Suicide Risk Assessment  completed by Attending Physician prior to discharge.  Discharge destination:  Home  Is patient on multiple antipsychotic therapies at discharge:  No   Has Patient had three or more failed trials of antipsychotic monotherapy by history:  No  Recommended Plan for Multiple Antipsychotic Therapies: NA  Allergies as of 09/17/2022       Reactions   Amoxicillin Rash, Other (See Comments)   Has patient had a PCN reaction causing immediate rash, facial/tongue/throat swelling, SOB or lightheadedness with hypotension: yes Has patient had a PCN reaction causing severe rash involving mucus membranes or skin necrosis: No Has patient had a PCN reaction that required hospitalization: No Has patient had a PCN reaction occurring within the last 10 years: Yes If all of the above answers are "NO", then may proceed with Cephalosporin use.        Medication List     STOP taking these medications    Aristada 662 MG/2.4ML prefilled syringe Generic drug: ARIPiprazole Lauroxil ER   naproxen 500 MG tablet Commonly known as: NAPROSYN   OLANZapine 10 MG tablet Commonly known as: ZYPREXA   polyvinyl alcohol 1.4 % ophthalmic solution Commonly known as: LIQUIFILM TEARS       TAKE these medications      Indication  ARIPiprazole 20 MG tablet Commonly known as: ABILIFY Take 1 tablet (20 mg total) by mouth at bedtime. For mood stability. What changed:  medication strength how much to take additional instructions  Indication: Manic Phase of Manic-Depression, MIXED BIPOLAR AFFECTIVE DISORDER, Schizophrenia   ARIPiprazole ER 400 MG Srer injection Commonly known as: ABILIFY MAINTENA Inject 2 mLs (400 mg total) into the muscle every 28 (twenty-eight) days. (  Due on 10-13-22): For mood stability Start taking on: October 13, 2022 What changed: You were already taking a medication with the same name, and this prescription was added. Make sure you understand how and when to take each.  Indication: Mood  stability.   benzocaine 10 % mucosal gel Commonly known as: ORAJEL Use as directed in the mouth or throat 2 (two) times daily as needed for mouth pain.  Indication: Toothache   hydrOXYzine 25 MG tablet Commonly known as: ATARAX Take 1 tablet (25 mg total) by mouth 3 (three) times daily as needed for anxiety.  Indication: Feeling Anxious   lithium carbonate 450 MG ER tablet Commonly known as: ESKALITH Take 1 tablet (450 mg total) by mouth at bedtime. For mood stabilization. What changed: additional instructions  Indication: Manic-Depression   propranolol 10 MG tablet Commonly known as: INDERAL Take 1 tablet (10 mg total) by mouth 2 (two) times daily. For anxiety What changed: additional instructions  Indication: Feeling Anxious, tachycardia   traZODone 50 MG tablet Commonly known as: DESYREL Take 1 tablet (50 mg total) by mouth at bedtime as needed for sleep.  Indication: Trouble Sleeping         Follow-up Information     [facility].   Why: You have an appointment for medication management services on 10/10/22 at 2:20 pm.  This appointment will be held in person.   Please contact your therapist Graylin Shiver to schedule an appointment.  ** MAKE SURE TO BRING YOUR DISCHARGE SUMMARY FROM THIS HOSPITALIZATION WITH YOU TO YOUR APPT. Contact information: [address] [place] [address] [phone]                Follow-up recommendation: Activity:  As tolerated Diet: As recommended by your primary care doctor. Keep all scheduled follow-up appointments as recommended.  Comments: Comments: Patient is recommended to follow-up care on an outpatient basis as noted above. Prescriptions sent to pt's pharmacy of choice at discharge.   Patient agreeable to plan.   Given opportunity to ask questions.   Appears to feel comfortable with discharge denies any current suicidal or homicidal thought. Patient is also instructed prior  to discharge to: Take all medications as prescribed by his/her mental healthcare provider. Report any adverse effects and or reactions from the medicines to his/her outpatient provider promptly. Patient has been instructed & cautioned: To not engage in alcohol and or illegal drug use while on prescription medicines. In the event of worsening symptoms, patient is instructed to call the crisis hotline, 911 and or go to the nearest ED for appropriate evaluation and treatment of symptoms. To follow-up with his/her primary care provider for your other medical issues, concerns and or health care needs.  Signed: Armandina Stammer, NP, pmhnp, fnp-bc. 09/17/2022, 9:57 AM

## 2022-09-17 NOTE — Progress Notes (Signed)
Pt discharged this afternoon. Pt left facility with grandparents. Pt removed all belongings and verbalized understanding of medications and discharge instructions. Pt denies SI/HI/AVH.

## 2022-09-17 NOTE — Progress Notes (Signed)
Pt complained of tooth ache, pt given Orajel and Advil, pt then went back to her room, became agitated because of pain and punch the wall. Pt was given agitation protocol thereafter with good relief. Pt is currently a sleep, will continue to monitor.

## 2022-09-17 NOTE — [facility] Suicide Risk Assessment (Signed)
Suicide Risk Assessment  Discharge Assessment    [facility] Discharge Suicide Risk Assessment   Principal Problem: Bipolar affective disorder, current episode manic with psychotic symptoms (HCC)  Discharge Diagnoses: Principal Problem:   Bipolar affective disorder, current episode manic with psychotic symptoms (HCC) Active Problems:   Generalized anxiety disorder   Insomnia  Total Time spent with patient:  Greater than 30 minutes  Musculoskeletal: Strength & Muscle Tone: within normal limits Gait & Station: normal Patient leans: N/A  Psychiatric Specialty Exam  Presentation  General Appearance:  Appropriate for Environment; Casual; Fairly Groomed  Eye Contact: Good  Speech: Clear and Coherent  Speech Volume: Normal  Handedness: Right   Mood and Affect  Mood: Euthymic  Duration of Depression Symptoms: Less than two weeks  Affect: Appropriate; Congruent  Thought Process  Thought Processes: Coherent; Goal Directed; Linear  Descriptions of Associations:Intact  Orientation:Full (Time, Place and Person)  Thought Content:Logical  History of Schizophrenia/Schizoaffective disorder:No  Duration of Psychotic Symptoms:N/A  Hallucinations:Hallucinations: None Description of Auditory Hallucinations: NA Description of Visual Hallucinations: NA  Ideas of Reference:None  Suicidal Thoughts:Suicidal Thoughts: No  Homicidal Thoughts:Homicidal Thoughts: No  Sensorium  Memory: Immediate Good; Recent Good; Remote Good  Judgment: Good  Insight: Good  Executive Functions  Concentration: Good  Attention Span: Good  Recall: Good  Fund of Knowledge: Fair  Language: Good  Psychomotor Activity  Psychomotor Activity: Psychomotor Activity: Normal  Assets  Assets: Communication Skills; Desire for Improvement; Financial Resources/Insurance; Housing; Physical Health; Resilience; Social Support  Sleep  Sleep: Sleep: Good Number of Hours of Sleep:  8   Physical Exam: See H&P. Blood pressure 103/70, pulse (!) 102, temperature 98.3 F (36.8 C), temperature source Oral, resp. rate 16, height 5\' 8"  (1.727 m), weight 64.9 kg, last menstrual period 08/15/2022, SpO2 99 %, unknown if currently breastfeeding. Body mass index is 21.74 kg/m.  Mental Status Per Nursing Assessment::   On Admission:  NA  Demographic Factors:  Female and Adolescent or young adult  Loss Factors: NA  Historical Factors: Impulsivity  Risk Reduction Factors:   Responsible for children under 41 years of age, Sense of responsibility to family, Living with another person, especially a relative, Positive social support, Positive therapeutic relationship, and Positive coping skills or problem solving skills  Continued Clinical Symptoms:  Bipolar Disorder:   Mixed State Alcohol/Substance Abuse/Dependencies Previous Psychiatric Diagnoses and Treatments  Cognitive Features That Contribute To Risk:  Closed-mindedness, Polarized thinking, and Thought constriction (tunnel vision)    Suicide Risk:  Minimal: No identifiable suicidal ideation.  Patients presenting with no risk factors but with morbid ruminations; may be classified as minimal risk based on the severity of the depressive symptoms   Follow-up Information     [facility] Go on 10[facility]10/2022.   Why: You have an appointment for medication management services on 10/10/22 at 2:20 pm.  This appointment will be held in person.   Please contact your therapist Graylin Shiver to schedule an appointment.  ** MAKE SURE TO BRING YOUR DISCHARGE SUMMARY FROM THIS HOSPITALIZATION WITH YOU TO YOUR APPT. Contact information: [address] [place] [address] [phone]                Plan Of Care/Follow-up recommendations:  See the discharge recommendation above.  Armandina Stammer, NP, pmhnp, fnp-bc 09/17/2022, 9:27 AM

## 2022-09-17 NOTE — [facility] Group Notes (Signed)
[facility] Group Notes:  (Nursing/MHT/Case Management/Adjunct)  Date:  09/17/2022  Time: 1000  Type of Therapy:  Psychoeducational Skills  Participation Level:  active  Participation Quality:  appropriate  Affect:  appropriate  Cognitive: appropriate  Insight:  engaged  Engagement in Group:  engaged, supportive  Modes of Intervention:  Discussion, Education, and Exploration  Summary of Progress/Problems: Patients were given two poems to read, one titled ''watch your thoughts for they become your actions'' and another '' the owl and the chimpanzee'' by Jacquelyne Balint. Patients were given education on positive reframing and asked to identify one negative belief about themselves they would like to heal from to impact positive mental well being. Discussion, education and education of healthy coping skills were also discussed in group. Pt shared and was appropriate.

## 2022-09-17 NOTE — Progress Notes (Signed)
  [facility] Adult Case Management Discharge Plan :  Will you be returning to the same living situation after discharge:  Yes,  Patient will be returning to her own apartment. At discharge, do you have transportation home?: Yes,  Patient's grandmother will provide transport home. Do you have the ability to pay for your medications: Yes,  patient has medical insurance to cover cost of medications.  Release of information consent forms completed and in the chart;  Patient's signature needed at discharge.  Patient to Follow up at:  Follow-up Information     [facility] Go on 10/10/2022.   Why: You have an appointment for medication management services on 10/10/22 at 2:20 pm.  This appointment will be held in person.   Please contact your therapist Graylin Shiver to schedule an appointment.  ** MAKE SURE TO BRING YOUR DISCHARGE SUMMARY FROM THIS HOSPITALIZATION WITH YOU TO YOUR APPT. Contact information: [address] [place] [address] [phone]                 Next level of care provider has access to [facility]:no  Safety Planning and Suicide Prevention discussed: Yes,  completed with patient's mother, [name]     Has patient been referred to the Quitline?: Patient does not use tobacco/nicotine products  Patient has been referred for addiction treatment: Patient refused referral for treatment.  [address] [place], [place] 09/17/2022, 9:27 AM

## 2022-09-17 NOTE — [facility] Group Notes (Signed)
[facility] Group Notes:  (Nursing/MHT/Case Management/Adjunct)  Date:  09/17/2022  Time: 0900  Type of Therapy:  Psychoeducational Skills/Daily Goals Group   Participation Level:  Active  Participation Quality:  Appropriate  Affect:  Appropriate  Cognitive:  Appropriate  Insight:  Appropriate  Engagement in Group:  Distracting and Engaged  Modes of Intervention:  Discussion, Education, and Exploration  Summary of Progress/Problems: Daily Goals/Orientation Group. Pts were asked to check in and share how they were feeling emotionally.

## 2022-09-29 ENCOUNTER — Encounter (HOSPITAL_COMMUNITY): Payer: Self-pay

## 2022-09-29 ENCOUNTER — Ambulatory Visit (HOSPITAL_COMMUNITY)
Admission: EM | Admit: 2022-09-29 | Discharge: 2022-09-29 | Disposition: A | Discharge status: Home / Self Care | Payer: BC Managed Care – PPO | Attending: Nurse Practitioner | Admitting: Nurse Practitioner

## 2022-09-29 DIAGNOSIS — K047 Periapical abscess without sinus: Secondary | ICD-10-CM

## 2022-09-29 MED ORDER — CLINDAMYCIN HCL 300 MG PO CAPS: ORAL_CAPSULE | ORAL | 0 refills | Status: AC

## 2022-09-29 NOTE — ED Provider Notes (Signed)
[facility]    CSN: [id number] Arrival date & time: 09/29/22  0818      History   Chief Complaint Chief Complaint  Patient presents with   Dental Pain    HPI Cheryl Cohen is a 32 y.o. female.   HPI She is in today complaining of right facial pain ongoing for several months.  She feels like it is related to her tooth.  She has pain that radiates up into her ear and into her eye.  She has been using over-the-counter Orajel with minimal to no relief along with BC and Tylenol for pain.  She has had a decreased appetite related to the pain.  She denies any fever.  She reports that she did have episodes of chills.She denies any difficulty swallowing. She denies headaches or dizziness, shortness of breath, chest pains, nausea or vomiting.  She reports that she is having some dental insurance issues.  She is getting established with a dentist.  Past Medical History:  Diagnosis Date   Anxiety    Bipolar 1 disorder (HCC)    hospitalized  3 x onset rx in 9th grade    Depression    Gestational diabetes    Gonorrhea    Hx of varicella    Syphilis    Twin pregnancy, delivered vaginally, IUFD stillborns 09/09/2015   IUFD  22 + weeks  still borns     Patient Active Problem List   Diagnosis Date Noted   Delta-9-tetrahydrocannabinol (THC) dependence (HCC) 07/19/2022   Generalized anxiety disorder 07/19/2022   Insomnia 07/19/2022   Bipolar affective disorder, mixed, severe, with psychotic behavior (HCC) 07/18/2022   Bipolar affective disorder, current episode manic with psychotic symptoms (HCC) 07/18/2022   Bipolar disorder, current episode hypomanic (HCC) 07/17/2022   Abnormal cervical Papanicolaou smear 10/14/2021   Gestational diabetes mellitus (GDM), antepartum 08/25/2020   Psychotic disorder with delusions (HCC)    Bipolar affective disorder, mixed, severe (HCC) 10/04/2016   Fetal demise, greater than 22 weeks, antepartum 09/08/2015   Suicidal ideation 10/06/2013    Family history of diabetes mellitus type II 01/11/2012    Past Surgical History:  Procedure Laterality Date   DILATION AND EVACUATION N/A 09/09/2015   Procedure: DILATATION AND EVACUATION;  Surgeon: [name], MD;  Location: [facility];  Service: Gynecology;  Laterality: N/A;   TYMPANOSTOMY TUBE PLACEMENT     asage 2-3 yrs    OB History     Gravida  6   Para  2   Term  1   Preterm  1   AB  1   Living  1      SAB      IAB  1   Ectopic      Multiple  1   Live Births  1            Home Medications    Prior to Admission medications   Medication Sig Start Date End Date Taking? Authorizing Provider  ARIPiprazole (ABILIFY) 20 MG tablet Take 1 tablet (20 mg total) by mouth at bedtime. For mood stability. 09/17/22  Yes Armandina Stammer I, NP  ARIPiprazole ER (ABILIFY MAINTENA) 400 MG SRER injection Inject 2 mLs (400 mg total) into the muscle every 28 (twenty-eight) days. (Due on 10-13-22): For mood stability 10/13/22  Yes Nwoko, Nicole Kindred I, NP  benzocaine (ORAJEL) 10 % mucosal gel Use as directed in the mouth or throat 2 (two) times daily as needed for mouth pain. 09/17/22  Yes Nwoko,  Nelda Marseille, NP  clindamycin (CLEOCIN) 300 MG capsule Take 2 capsules (600 mg total) by mouth daily for 1 day, THEN 1 capsule (300 mg total) 4 (four) times daily for 3 days. 09/29/22 10/03/22 Yes Barbette Merino, NP  hydrOXYzine (ATARAX) 25 MG tablet Take 1 tablet (25 mg total) by mouth 3 (three) times daily as needed for anxiety. 09/17/22  Yes Armandina Stammer I, NP  lithium carbonate (ESKALITH) 450 MG ER tablet Take 1 tablet (450 mg total) by mouth at bedtime. For mood stabilization. 09/17/22  Yes Armandina Stammer I, NP  propranolol (INDERAL) 10 MG tablet Take 1 tablet (10 mg total) by mouth 2 (two) times daily. For anxiety 09/17/22  Yes Armandina Stammer I, NP  traZODone (DESYREL) 50 MG tablet Take 1 tablet (50 mg total) by mouth at bedtime as needed for sleep. 09/17/22  Yes Sanjuana Kava, NP    Family History Family  History  Problem Relation Age of Onset   Diabetes Mother    Arthritis Mother    Hypertension Father    Alcohol abuse Father    Alcoholism Father     Social History Social History   Tobacco Use   Smoking status: Every Day    Types: Cigars   Smokeless tobacco: Former   Tobacco comments:    1 cigarette a day  Vaping Use   Vaping Use: Never used  Substance Use Topics   Alcohol use: Yes   Drug use: Yes    Types: Marijuana     Allergies   Amoxicillin   Review of Systems Review of Systems   Physical Exam Triage Vital Signs ED Triage Vitals  Enc Vitals Group     BP 09/29/22 0852 94/63     Pulse Rate 09/29/22 0852 83     Resp 09/29/22 0852 16     Temp 09/29/22 0852 98.2 F (36.8 C)     Temp Source 09/29/22 0852 Oral     SpO2 09/29/22 0852 97 %     Weight 09/29/22 0852 144 lb (65.3 kg)     Height 09/29/22 0852 5\' 8"  (1.727 m)     Head Circumference --      Peak Flow --      Pain Score 09/29/22 0848 10     Pain Loc --      Pain Edu? --      Excl. in GC? --    No data found.  Updated Vital Signs BP 94/63 (BP Location: Left Arm)   Pulse 83   Temp 98.2 F (36.8 C) (Oral)   Resp 16   Ht 5\' 8"  (1.727 m)   Wt 144 lb (65.3 kg)   LMP 09/17/2022 Comment: Pt reports having an abortion end of May 2023  SpO2 97%   BMI 21.90 kg/m   Visual Acuity Right Eye Distance:   Left Eye Distance:   Bilateral Distance:    Right Eye Near:   Left Eye Near:    Bilateral Near:     Physical Exam Constitutional:      General: She is not in acute distress.    Appearance: She is normal weight. She is not ill-appearing.  HENT:     Head: Normocephalic and atraumatic.     Right Ear: Tympanic membrane normal.     Left Ear: Tympanic membrane normal.     Nose: Nose normal.     Mouth/Throat:     Mouth: Mucous membranes are moist.     Comments: Broken right  lower wisdom tooth  Eyes:     Pupils: Pupils are equal, round, and reactive to light.  Cardiovascular:     Rate and  Rhythm: Normal rate.  Pulmonary:     Effort: Pulmonary effort is normal.  Musculoskeletal:        General: Normal range of motion.     Cervical back: Normal range of motion.  Skin:    General: Skin is warm.     Capillary Refill: Capillary refill takes less than 2 seconds.  Neurological:     General: No focal deficit present.     Mental Status: She is alert and oriented to person, place, and time.  Psychiatric:        Mood and Affect: Mood normal.      UC Treatments / Results  Labs (all labs ordered are listed, but only abnormal results are displayed) Labs Reviewed - No data to display  EKG   Radiology No results found.  Procedures Procedures (including critical care time)  Medications Ordered in UC Medications - No data to display  Initial Impression / Assessment and Plan / UC Course  I have reviewed the triage vital signs and the nursing notes.  Pertinent labs & imaging results that were available during my care of the patient were reviewed by me and considered in my medical decision making (see chart for details).     Dental pain Final Clinical Impressions(s) / UC Diagnoses   Final diagnoses:  Dental abscess     Discharge Instructions      You have a Dental abscess. You have been prescribed clindamycin 600 mg tabs 1 times daily  then 300 mg PO 4 times daily for 3 days. This is due to the Amoxicillin allergy. Please take as directed and complete the prescription as prescribed. If symptoms persist or get worse follow up at urgent care or with PCP    ED Prescriptions     Medication Sig Dispense Auth. Provider   clindamycin (CLEOCIN) 300 MG capsule Take 2 capsules (600 mg total) by mouth daily for 1 day, THEN 1 capsule (300 mg total) 4 (four) times daily for 3 days. 14 capsule Barbette Merino, NP      PDMP not reviewed this encounter.   Thad Ranger [place], [place] 09/29/22 [phone]

## 2022-09-29 NOTE — ED Triage Notes (Signed)
Patient here today with c/o upper and lower right side tooth pain X 2 months. She gets some relief with BC powders and Tylenol. She also uses Orajel with minimal relief.

## 2022-09-29 NOTE — Discharge Instructions (Addendum)
You have a Dental abscess. You have been prescribed clindamycin 600 mg tabs 1 times daily  then 300 mg PO 4 times daily for 3 days. This is due to the Amoxicillin allergy. Please take as directed and complete the prescription as prescribed. If symptoms persist or get worse follow up at urgent care or with PCP

## 2022-10-03 DIAGNOSIS — F909 Attention-deficit hyperactivity disorder, unspecified type: Secondary | ICD-10-CM | POA: Diagnosis not present

## 2022-10-03 DIAGNOSIS — F310 Bipolar disorder, current episode hypomanic: Secondary | ICD-10-CM | POA: Diagnosis not present

## 2022-10-17 DIAGNOSIS — F310 Bipolar disorder, current episode hypomanic: Secondary | ICD-10-CM | POA: Diagnosis not present

## 2022-10-17 DIAGNOSIS — F909 Attention-deficit hyperactivity disorder, unspecified type: Secondary | ICD-10-CM | POA: Diagnosis not present

## 2022-10-25 ENCOUNTER — Encounter: Payer: Self-pay | Admitting: Obstetrics and Gynecology

## 2022-10-25 ENCOUNTER — Ambulatory Visit (INDEPENDENT_AMBULATORY_CARE_PROVIDER_SITE_OTHER): Payer: BC Managed Care – PPO

## 2022-10-25 VITALS — BP 119/78 | HR 77

## 2022-10-25 DIAGNOSIS — Z3201 Encounter for pregnancy test, result positive: Secondary | ICD-10-CM | POA: Diagnosis not present

## 2022-10-25 LAB — POCT URINE PREGNANCY: Preg Test, Ur: POSITIVE — AB

## 2022-10-25 NOTE — Progress Notes (Signed)
Cheryl Cohen here for a UPT. Pt had a positive upt at home. LMP is 06/176/2024.     UPT in office Positive.    Reviewed medications and informed patient to begin a prenatal vitamin.   Pt to follow up 11/21/2022 for New OB intake.

## 2022-11-14 DIAGNOSIS — F310 Bipolar disorder, current episode hypomanic: Secondary | ICD-10-CM | POA: Diagnosis not present

## 2022-11-14 DIAGNOSIS — F909 Attention-deficit hyperactivity disorder, unspecified type: Secondary | ICD-10-CM | POA: Diagnosis not present

## 2022-11-21 ENCOUNTER — Other Ambulatory Visit (INDEPENDENT_AMBULATORY_CARE_PROVIDER_SITE_OTHER): Payer: BC Managed Care – PPO

## 2022-11-21 ENCOUNTER — Ambulatory Visit (INDEPENDENT_AMBULATORY_CARE_PROVIDER_SITE_OTHER): Payer: BC Managed Care – PPO | Admitting: *Deleted

## 2022-11-21 ENCOUNTER — Other Ambulatory Visit (HOSPITAL_COMMUNITY)
Admission: RE | Admit: 2022-11-21 | Discharge: 2022-11-21 | Disposition: A | Discharge status: Home / Self Care | Payer: BC Managed Care – PPO | Source: Ambulatory Visit | Attending: Obstetrics and Gynecology | Admitting: Obstetrics and Gynecology

## 2022-11-21 VITALS — BP 95/62 | HR 68 | Wt 150.0 lb

## 2022-11-21 DIAGNOSIS — Z1339 Encounter for screening examination for other mental health and behavioral disorders: Secondary | ICD-10-CM | POA: Diagnosis not present

## 2022-11-21 DIAGNOSIS — O3680X0 Pregnancy with inconclusive fetal viability, not applicable or unspecified: Secondary | ICD-10-CM

## 2022-11-21 DIAGNOSIS — O0990 Supervision of high risk pregnancy, unspecified, unspecified trimester: Secondary | ICD-10-CM | POA: Insufficient documentation

## 2022-11-21 DIAGNOSIS — O0991 Supervision of high risk pregnancy, unspecified, first trimester: Secondary | ICD-10-CM

## 2022-11-21 DIAGNOSIS — Z3A10 10 weeks gestation of pregnancy: Secondary | ICD-10-CM

## 2022-11-21 DIAGNOSIS — Z8759 Personal history of other complications of pregnancy, childbirth and the puerperium: Secondary | ICD-10-CM | POA: Insufficient documentation

## 2022-11-21 DIAGNOSIS — O364XX0 Maternal care for intrauterine death, not applicable or unspecified: Secondary | ICD-10-CM | POA: Insufficient documentation

## 2022-11-21 DIAGNOSIS — Z3A11 11 weeks gestation of pregnancy: Secondary | ICD-10-CM

## 2022-11-21 NOTE — Patient Instructions (Signed)
[facility] has a partnership with the [employer] to provide prenatal navigation for the most needed resources in our community. In order to see how we can help connect you to these resources we need consent to contact you. Please complete the very short consent using the link below:   English Link: https://[place].tfaforms.net/283?site=16  Spanish Link: https://[place].tfaforms.net/287?site=16   Options for Doula Care in the [place] Area  As you review your birthing options, consider having a birth doula. A doula is trained to provide support before, during and just after you give birth. There are also postpartum doulas that help you adjust to new parenthood.  While doulas do not provide medical care, they do provide emotional, physical and educational support. A few months before your baby arrives, doulas can help answer questions, ease concerns and help you create and support your birthing plan.    Doulas can help reduce your stress and comfort you and your partner. They can help you cope with labor by helping you use breathing techniques, massage, creative labor positioning, essential oils and affirmations.   Studies show that the benefits of having a doula include:   A more positive birth experience  Fewer requests for pain-relief medication  Less likelihood of cesarean section, commonly called a c-section   Doulas are typically hired via a fee and contract between you and the doula. We are happy to provide a list of the most active doulas in the area, all of whom are credentialed by [facility] and will not count as a visitor at your birth.  There are several options for no-cost doula care at our hospital, including:  [facility] Volunteer Doula Program Every Baby [place] Doula Program A Cure 4 Moms Doula Study (available only at [facility], [facility], [place] and [place] [facility] offices)  For more information on these programs or to receive  a list of doulas active in our area, please email [email or web address]         

## 2022-11-21 NOTE — Progress Notes (Signed)
New OB Intake  I connected with Cheryl Cohen  on 11/21/22 at 10:15 AM EDT by In Person Visit and verified that I am speaking with the correct person using two identifiers. Nurse is located at [facility] and pt is located at [place].  I discussed the limitations, risks, security and privacy concerns of performing an evaluation and management service by telephone and the availability of in person appointments. I also discussed with the patient that there may be a patient responsible charge related to this service. The patient expressed understanding and agreed to proceed.  I explained I am completing New OB Intake today. We discussed EDD of Not found.. Pt is [id number]. I reviewed her allergies, medications and Medical/Surgical/OB history.    Patient Active Problem List   Diagnosis Date Noted   IUFD at 20 weeks or more of gestation 11/21/2022   Delta-9-tetrahydrocannabinol [facility]) dependence (HCC) 07/19/2022   Generalized anxiety disorder 07/19/2022   Insomnia 07/19/2022   Bipolar affective disorder, mixed, severe, with psychotic behavior (HCC) 07/18/2022   Bipolar affective disorder, current episode manic with psychotic symptoms (HCC) 07/18/2022   Bipolar disorder, current episode hypomanic (HCC) 07/17/2022   Abnormal cervical Papanicolaou smear 10/14/2021   Gestational diabetes mellitus (GDM), antepartum 08/25/2020   Psychotic disorder with delusions (HCC)    Bipolar affective disorder, mixed, severe (HCC) 10/04/2016   Fetal demise, greater than 22 weeks, antepartum 09/08/2015   Suicidal ideation 10/06/2013   Family history of diabetes mellitus type II 01/11/2012    Concerns addressed today  Delivery Plans Plans to deliver at [facility]. Discussed the nature of our practice with multiple providers including residents and students. Due to the size of the practice, the delivering provider may not be the same as those providing prenatal care.   Patient is interested in water birth. Offered  upcoming OB visit with CNM to discuss further.  MyChart/Babyscripts MyChart access verified. I explained pt will have some visits in office and some virtually. Babyscripts instructions given and order placed. Patient verifies receipt of registration text/e-mail. Account successfully created and app downloaded.  Blood Pressure Cuff/Weight Scale Patient has private insurance; instructed to purchase blood pressure cuff and bring to first prenatal appt. Explained after first prenatal appt pt will check weekly and document in Babyscripts. Patient does not have weight scale; patient may purchase if they desire to track weight weekly in Babyscripts.  Anatomy US Explained first scheduled [place] will be around 19 weeks. Anatomy US scheduled for TBD at TBD.  Interested in [place]? If yes, send referral and doula dot phrase.   Is patient a candidate for Babyscripts Optimization? Yes  First visit review I reviewed new OB appt with patient. Explained pt will be seen by Dr. Berton Lan at first visit. Discussed Avelina Laine genetic screening with patient. Requested Panorama and Horizon.. Routine prenatal labs  collected at today's visit.    Last Pap Diagnosis  Date Value Ref Range Status  05/24/2022   Final   - Negative for intraepithelial lesion or malignancy (NILM)    Harrel Lemon, RN 11/21/2022  10:31 AM

## 2022-11-22 LAB — CERVICOVAGINAL ANCILLARY ONLY
Chlamydia: NEGATIVE
Comment: NEGATIVE
Comment: NORMAL
Neisseria Gonorrhea: NEGATIVE

## 2022-11-23 ENCOUNTER — Encounter: Payer: Self-pay | Admitting: Internal Medicine

## 2022-11-23 LAB — CBC/D/PLT+RPR+RH+ABO+RUBIGG...
Antibody Screen: NEGATIVE
Basophils Absolute: 0.1 10*3/uL (ref 0.0–0.2)
Basos: 0 %
EOS (ABSOLUTE): 0.2 10*3/uL (ref 0.0–0.4)
Eos: 2 %
HCV Ab: NONREACTIVE
HIV Screen 4th Generation wRfx: NONREACTIVE
Hematocrit: 42.7 % (ref 34.0–46.6)
Hemoglobin: 13.9 g/dL (ref 11.1–15.9)
Hepatitis B Surface Ag: NEGATIVE
Immature Grans (Abs): 0 10*3/uL (ref 0.0–0.1)
Immature Granulocytes: 0 %
Lymphocytes Absolute: 2.2 10*3/uL (ref 0.7–3.1)
Lymphs: 20 %
MCH: 30 pg (ref 26.6–33.0)
MCHC: 32.6 g/dL (ref 31.5–35.7)
MCV: 92 fL (ref 79–97)
Monocytes Absolute: 0.9 10*3/uL (ref 0.1–0.9)
Monocytes: 8 %
Neutrophils Absolute: 7.8 10*3/uL — ABNORMAL HIGH (ref 1.4–7.0)
Neutrophils: 70 %
Platelets: 268 10*3/uL (ref 150–450)
RBC: 4.63 x10E6/uL (ref 3.77–5.28)
RDW: 12.4 % (ref 11.7–15.4)
RPR Ser Ql: NONREACTIVE
Rh Factor: POSITIVE
Rubella Antibodies, IGG: 1 {index} (ref >0.99)
WBC: 11.3 10*3/uL — ABNORMAL HIGH (ref 3.4–10.8)

## 2022-11-23 LAB — HCV INTERPRETATION

## 2022-11-23 LAB — HEMOGLOBIN A1C
Est. average glucose Bld gHb Est-mCnc: 114 mg/dL
Hgb A1c MFr Bld: 5.6 % (ref 4.8–5.6)

## 2022-11-25 LAB — URINE CULTURE, OB REFLEX

## 2022-11-25 LAB — CULTURE, OB URINE

## 2022-11-28 LAB — HORIZON CUSTOM: REPORT SUMMARY: NEGATIVE

## 2022-11-30 LAB — PANORAMA PRENATAL TEST FULL PANEL:PANORAMA TEST PLUS 5 ADDITIONAL MICRODELETIONS: FETAL FRACTION: 7.4

## 2022-12-06 ENCOUNTER — Ambulatory Visit (INDEPENDENT_AMBULATORY_CARE_PROVIDER_SITE_OTHER): Payer: BC Managed Care – PPO | Admitting: Licensed Clinical Social Worker

## 2022-12-06 ENCOUNTER — Ambulatory Visit (INDEPENDENT_AMBULATORY_CARE_PROVIDER_SITE_OTHER): Payer: BC Managed Care – PPO | Admitting: Obstetrics and Gynecology

## 2022-12-06 ENCOUNTER — Other Ambulatory Visit (HOSPITAL_COMMUNITY)
Admission: RE | Admit: 2022-12-06 | Discharge: 2022-12-06 | Disposition: A | Discharge status: Home / Self Care | Payer: BC Managed Care – PPO | Source: Ambulatory Visit | Attending: Obstetrics and Gynecology | Admitting: Obstetrics and Gynecology

## 2022-12-06 VITALS — BP 96/61 | HR 85 | Wt 152.0 lb

## 2022-12-06 DIAGNOSIS — Z3A12 12 weeks gestation of pregnancy: Secondary | ICD-10-CM | POA: Diagnosis not present

## 2022-12-06 DIAGNOSIS — O0990 Supervision of high risk pregnancy, unspecified, unspecified trimester: Secondary | ICD-10-CM

## 2022-12-06 DIAGNOSIS — N898 Other specified noninflammatory disorders of vagina: Secondary | ICD-10-CM

## 2022-12-06 DIAGNOSIS — F3164 Bipolar disorder, current episode mixed, severe, with psychotic features: Secondary | ICD-10-CM

## 2022-12-06 DIAGNOSIS — B9689 Other specified bacterial agents as the cause of diseases classified elsewhere: Secondary | ICD-10-CM | POA: Diagnosis not present

## 2022-12-06 DIAGNOSIS — Z8632 Personal history of gestational diabetes: Secondary | ICD-10-CM | POA: Diagnosis not present

## 2022-12-06 DIAGNOSIS — O0991 Supervision of high risk pregnancy, unspecified, first trimester: Secondary | ICD-10-CM | POA: Diagnosis not present

## 2022-12-06 DIAGNOSIS — O98811 Other maternal infectious and parasitic diseases complicating pregnancy, first trimester: Secondary | ICD-10-CM | POA: Insufficient documentation

## 2022-12-06 DIAGNOSIS — O26891 Other specified pregnancy related conditions, first trimester: Secondary | ICD-10-CM | POA: Insufficient documentation

## 2022-12-06 DIAGNOSIS — F3163 Bipolar disorder, current episode mixed, severe, without psychotic features: Secondary | ICD-10-CM

## 2022-12-06 DIAGNOSIS — Z8759 Personal history of other complications of pregnancy, childbirth and the puerperium: Secondary | ICD-10-CM | POA: Diagnosis not present

## 2022-12-06 DIAGNOSIS — O23591 Infection of other part of genital tract in pregnancy, first trimester: Secondary | ICD-10-CM | POA: Insufficient documentation

## 2022-12-06 DIAGNOSIS — B3731 Acute candidiasis of vulva and vagina: Secondary | ICD-10-CM | POA: Insufficient documentation

## 2022-12-06 NOTE — Progress Notes (Addendum)
NOB, c/o vaginal itching, odor x 2 weeks.

## 2022-12-07 ENCOUNTER — Encounter: Payer: Self-pay | Admitting: Obstetrics and Gynecology

## 2022-12-07 DIAGNOSIS — Z8632 Personal history of gestational diabetes: Secondary | ICD-10-CM | POA: Insufficient documentation

## 2022-12-07 LAB — CERVICOVAGINAL ANCILLARY ONLY
Bacterial Vaginitis (gardnerella): POSITIVE — AB
Candida Glabrata: NEGATIVE
Candida Vaginitis: POSITIVE — AB
Comment: NEGATIVE
Comment: NEGATIVE
Comment: NEGATIVE

## 2022-12-07 NOTE — BH Specialist Note (Signed)
Integrated Behavioral Health Follow Up In-Person Visit  MRN: [id number] Name: Cheryl Cohen  Number of Integrated Behavioral Health Clinician visits: 2 Session Start time:  11:22am Session End time: 12:00pm Total time in minutes: 38 mins in person at [facility]   Types of Service: Individual psychotherapy  Interpretor:No. Interpretor Name and Language: none  Subjective: Cheryl Cohen is a 32 y.o. female accompanied by n/a Patient was referred by Dr. Berton Lan for bipolar disorder. Patient reports the following symptoms/concerns: hx of suicidal ideation, depressed mood, negative thought patterns, relationship conflict  Duration of problem: over one year ; Severity of problem: mild  Objective: Mood: good and Affect: Appropriate Risk of harm to self or others: No plan to harm self or others  Life Context: Family and Social: Lives in [place] with partner and son  School/Work: Employed at [employer]  Self-Care: n/a Life Changes: new pregnancy  Patient and/or Family's Strengths/Protective Factors: Concrete supports in place (healthy food, safe environments, etc.)  Goals Addressed: Patient will:  Reduce symptoms of: bipolar disorder   Increase knowledge and/or ability of: coping skills, healthy habits, and self-management skills   Demonstrate ability to: Increase healthy adjustment to current life circumstances  Progress towards Goals: Ongoing  Interventions: Interventions utilized:  Motivational Interviewing and Supportive Counseling Standardized Assessments completed: PHQ 9  Patient and/or Family Response: Cheryl Cohen was hypervigilant during visit. Cheryl Cohen reports she was looking for father of  baby. Cheryl Cohen reports she is following treatment plan with psychiatrist. Cheryl Cohen reports she is excited about current pregnancy and her mother is a big support. Cheryl Cohen reports relationship with father of baby has room to improve significantly however she is more focused on  herself.   Assessment: Patient currently experiencing bipolar disorder.   Patient may benefit from integrated behavioral heath and current behavioral health provider.  Plan: Follow up with behavioral health clinician on : next ob visit  Behavioral recommendations: Communicate need for added support when needed, avoid conflict and stressful encounters, follow bh treatment plan with psychiatrist and engage in positive self care techniques  Referral(s): Integrated [employer] (In Clinic) "From scale of 1-10, how likely are you to follow plan?":    Gwyndolyn Saxon, LCSW

## 2022-12-07 NOTE — Progress Notes (Signed)
Subjective:   Cheryl Cohen is a 32 y.o. [id number] at [redacted]w[redacted]d by early (10w) ultrasound being seen today for her first obstetrical visit.  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 Patient does intend to breast feed. Pregnancy history fully reviewed.  Patient reports  sciatica .  HISTORY: OB History  Gravida Para Term Preterm AB Living  6 2 1 1 3 1   SAB IAB Ectopic Multiple Live Births  0 3 0 1 1    # Outcome Date GA Lbr Len/2nd Weight Sex Type Anes PTL Lv  6 Current           5 IAB 2023          4 Term 11/02/20 [redacted]w[redacted]d / 00:19 7 lb 4.9 oz (3.314 kg) M Vag-Spont EPI  LIV     Name: KYSHIA, DONZE     Apgar1: 8  Apgar5: 9  3 IAB 2022          2 IAB 06/25/17 [redacted]w[redacted]d         1A Preterm 09/09/15 101w5d 00:50 / 00:25 10.4 oz (0.295 kg) F Vag-Spont None  FD     Name: Magner,[name]     Apgar1: 0  Apgar5: 0  1B Preterm 09/09/15 [redacted]w[redacted]d 00:50 / 00:43 13.9 oz (0.394 kg) F Vag-Spont None  FD     Name: Sandoval,[name]     Apgar1: 0  Apgar5: 0    Last pap smear: Lab Results  Component Value Date   DIAGPAP  05/24/2022    - Negative for intraepithelial lesion or malignancy (NILM)   HPVHIGH Negative 05/24/2022   Past Medical History:  Diagnosis Date   Anxiety    Bipolar 1 disorder (HCC)    hospitalized  3 x onset rx in 9th grade    Family history of diabetes mellitus type II 01/11/2012   Gestational diabetes    Gonorrhea    Hx of varicella    Psychotic disorder with delusions (HCC)    Suicidal ideation 10/06/2013   Syphilis    Twin pregnancy, delivered vaginally, IUFD stillborns 09/09/2015   IUFD  22 + weeks  still borns    Past Surgical History:  Procedure Laterality Date   DILATION AND EVACUATION N/A 09/09/2015   Procedure: DILATATION AND EVACUATION;  Surgeon: [name], MD;  Location: [facility];  Service: Gynecology;  Laterality:  N/A;   TYMPANOSTOMY TUBE PLACEMENT     asage 2-3 yrs   Family History  Problem Relation Age of Onset   Diabetes Mother    Arthritis Mother    Stroke Father    Hypertension Father    Alcohol abuse Father    Alcoholism Father    Social History   Tobacco Use   Smoking status: Former    Types: Cigars   Smokeless tobacco: Former   Tobacco comments:    1 cigarette a day  Vaping Use   Vaping status: Never Used  Substance Use Topics   Alcohol use: Not Currently   Drug use: Yes    Types: Marijuana   Allergies  Allergen Reactions   Amoxicillin Rash and Other (See Comments)    Has patient had a PCN reaction causing immediate rash, facial/tongue/throat swelling, SOB or lightheadedness with hypotension: yes Has patient had a PCN reaction causing severe rash involving mucus membranes or skin necrosis:  No Has patient had a PCN reaction that required hospitalization: No Has patient had a PCN reaction occurring within the last 10 years: Yes If all of the above answers are "NO", then may proceed with Cephalosporin use.    Current Outpatient Medications on File Prior to Visit  Medication Sig Dispense Refill   ARIPiprazole (ABILIFY) 20 MG tablet Take 1 tablet (20 mg total) by mouth at bedtime. For mood stability. (Patient not taking: Reported on 10/25/2022) 7 tablet 0   ARIPiprazole ER (ABILIFY MAINTENA) 400 MG SRER injection Inject 2 mLs (400 mg total) into the muscle every 28 (twenty-eight) days. (Due on 10-13-22): For mood stability 1 each 0   benzocaine (ORAJEL) 10 % mucosal gel Use as directed in the mouth or throat 2 (two) times daily as needed for mouth pain. (Patient not taking: Reported on 12/06/2022) 5.3 g 0   hydrOXYzine (ATARAX) 25 MG tablet Take 1 tablet (25 mg total) by mouth 3 (three) times daily as needed for anxiety. (Patient not taking: Reported on 10/25/2022) 75 tablet 0   lithium carbonate (ESKALITH) 450 MG ER tablet Take 1 tablet (450 mg total) by mouth at bedtime. For mood  stabilization. (Patient not taking: Reported on 11/21/2022) 30 tablet 0   Prenatal Vit-Fe Fumarate-FA (PRENATAL VITAMIN PO) Take 1 tablet by mouth daily.     propranolol (INDERAL) 10 MG tablet Take 1 tablet (10 mg total) by mouth 2 (two) times daily. For anxiety 60 tablet 0   traZODone (DESYREL) 50 MG tablet Take 1 tablet (50 mg total) by mouth at bedtime as needed for sleep. (Patient not taking: Reported on 10/25/2022) 30 tablet 0   No current facility-administered medications on file prior to visit.   Exam   Vitals:   12/06/22 1111  BP: 96/61  Pulse: 85  Weight: 152 lb (68.9 kg)   Fetal Heart Rate (bpm): 147  General:  Alert, oriented and cooperative. Patient is in no acute distress.  Breast: Deferred  Cardiovascular: Normal heart rate noted  Respiratory: Normal respiratory effort, no problems with respiration noted  Abdomen: Soft, non tender  Pain/Pressure: Absent     Extremities: Normal range of motion.  Edema: None  Mental Status: Normal mood and affect. Normal behavior. Normal judgment and thought content.    Assessment:   Pregnancy: [id number] Patient Active Problem List   Diagnosis Date Noted   History of IUFD 11/21/2022   Supervision of high risk pregnancy, antepartum 11/21/2022   Delta-9-tetrahydrocannabinol (THC) dependence (HCC) 07/19/2022   Generalized anxiety disorder 07/19/2022   Bipolar affective disorder, mixed, severe, with psychotic behavior (HCC) 07/18/2022   Abnormal cervical Papanicolaou smear 10/14/2021   Plan:  Supervision of high risk pregnancy, antepartum [O09.90] Initial labs reviewed and normal Continue prenatal vitamins. Genetic Screening previously completed and low risk Ultrasound discussed; fetal anatomic survey: ordered. Problem list reviewed and updated. The nature of [facility] with multiple MDs and other Advanced Practice Providers was explained to patient; also emphasized that residents, students are part  of our team. Routine obstetric precautions reviewed.  Vaginal odor -     Cervicovaginal ancillary only( [place])  History of IUFD G1 twin pregnancy at 25 weeks, documentation reports some concern for TTTS  Bipolar affective disorder, mixed, severe, with psychotic behavior (HCC) - Reports regular care with psychiatrist who is aware she is pregnant. She is unsure of her next appointment, but is due for abilify injection in September - On monthly abilify injections & propranolol  prn, stopped lithium and trazodone with +UPT - Reports good control of her mood symptoms with her current regimen - Integrated BH assessment completed today, see note for details   History of GDM A1c 5.6. Discussed utility of early 2h GTT, pt agreeable. Will schedule w/ next appt  Return in about 4 weeks (around 01/03/2023) for return OB at 16 weeks with 2h GTT.  Harvie Bridge, MD Obstetrician & Gynecologist, [facility]

## 2022-12-08 MED ORDER — MICONAZOLE NITRATE 2 % VA CREA: 1.0000 | TOPICAL_CREAM | Freq: Every day | VAGINAL | 0 refills | Status: AC

## 2022-12-08 MED ORDER — METRONIDAZOLE 500 MG PO TABS: 500.0000 mg | ORAL_TABLET | Freq: Two times a day (BID) | ORAL | 0 refills | Status: AC

## 2022-12-08 NOTE — Addendum Note (Signed)
Addended by: Harvie Bridge on: 12/08/2022 07:23 PM   Modules accepted: Orders

## 2022-12-18 DIAGNOSIS — F310 Bipolar disorder, current episode hypomanic: Secondary | ICD-10-CM | POA: Diagnosis not present

## 2022-12-18 DIAGNOSIS — F909 Attention-deficit hyperactivity disorder, unspecified type: Secondary | ICD-10-CM | POA: Diagnosis not present

## 2023-01-03 ENCOUNTER — Ambulatory Visit: Payer: BC Managed Care – PPO | Admitting: Obstetrics and Gynecology

## 2023-01-03 VITALS — BP 95/62 | HR 74 | Wt 151.2 lb

## 2023-01-03 DIAGNOSIS — Z23 Encounter for immunization: Secondary | ICD-10-CM

## 2023-01-03 DIAGNOSIS — F3163 Bipolar disorder, current episode mixed, severe, without psychotic features: Secondary | ICD-10-CM

## 2023-01-03 DIAGNOSIS — Z8632 Personal history of gestational diabetes: Secondary | ICD-10-CM

## 2023-01-03 DIAGNOSIS — O0990 Supervision of high risk pregnancy, unspecified, unspecified trimester: Secondary | ICD-10-CM

## 2023-01-03 DIAGNOSIS — Z8759 Personal history of other complications of pregnancy, childbirth and the puerperium: Secondary | ICD-10-CM

## 2023-01-03 DIAGNOSIS — Z3A16 16 weeks gestation of pregnancy: Secondary | ICD-10-CM

## 2023-01-03 NOTE — Progress Notes (Signed)
   PRENATAL VISIT NOTE  Subjective:  Cheryl Cohen is a 32 y.o. [phone] at [redacted]w[redacted]d being seen today for ongoing prenatal care.  She is currently monitored for the following issues for this high-risk pregnancy and has Abnormal cervical Papanicolaou smear; Bipolar affective disorder, mixed, severe, with psychotic behavior (HCC); Delta-9-tetrahydrocannabinol (THC) dependence (HCC); Generalized anxiety disorder; History of IUFD; Supervision of high risk pregnancy, antepartum; and History of gestational diabetes mellitus (GDM) on their problem list.  Patient reports no complaints.  Contractions: Not present. Vag. Bleeding: None.  Movement: Present. Denies leaking of fluid.   The following portions of the patient's history were reviewed and updated as appropriate: allergies, current medications, past family history, past medical history, past social history, past surgical history and problem list.   Objective:   Vitals:   01/03/23 1038  BP: 95/62  Pulse: 74  Weight: 151 lb 3.2 oz (68.6 kg)    Fetal Status: Fetal Heart Rate (bpm): 144   Movement: Present     General:  Alert, oriented and cooperative. Patient is in no acute distress.  Skin: Skin is warm and dry. No rash noted.   Cardiovascular: Normal heart rate noted  Respiratory: Normal respiratory effort, no problems with respiration noted  Abdomen: Soft, gravid, appropriate for gestational age.  Pain/Pressure: Absent     Pelvic: Cervical exam deferred        Extremities: Normal range of motion.  Edema: None  Mental Status: Normal mood and affect. Normal behavior. Normal judgment and thought content.   Assessment and Plan:  Pregnancy: [id number] at [redacted]w[redacted]d 1. Supervision of high risk pregnancy, antepartum [O09.90] Bp and FHR normal   2. Bipolar affective disorder, mixed, severe (HCC) Seeing psychiatrist Doing abilify injections and propranolol prn  Would like referral to counseling, referral placed and provided resource for [facility] as  well  3. History of IUFD @25  weeks w. twins  4. History of gestational diabetes mellitus (GDM) A1C 5.6, discussed at last visit doing early 2hr gtt, discussed lab visit next week and if normal repeating in third trimester  5. [redacted] weeks gestation of pregnancy AFP and flu shot today   Preterm labor symptoms and general obstetric precautions including but not limited to vaginal bleeding, contractions, leaking of fluid and fetal movement were reviewed in detail with the patient. Please refer to After Visit Summary for other counseling recommendations.   Return in four weeks for routine prenatal and 1 wk for early 2hrGTT  Future Appointments  Date Time Provider Department Center  01/26/2023  8:15 AM [facility] NURSE [facility]  01/26/2023  8:30 AM [facility] US3 [facility]  01/31/2023 10:55 AM Sue Lush, FNP [facility] None    Albertine Grates, FNP

## 2023-01-05 LAB — AFP, SERUM, OPEN SPINA BIFIDA
AFP MoM: 0.9
AFP Value: 32.6 ng/mL
Gest. Age on Collection Date: 16 wk
Maternal Age At EDD: 31.2 a
OSBR Risk 1 IN: 10000
Test Results:: NEGATIVE
Weight: 151 [lb_av]

## 2023-01-15 DIAGNOSIS — F909 Attention-deficit hyperactivity disorder, unspecified type: Secondary | ICD-10-CM | POA: Diagnosis not present

## 2023-01-15 DIAGNOSIS — F310 Bipolar disorder, current episode hypomanic: Secondary | ICD-10-CM | POA: Diagnosis not present

## 2023-01-26 ENCOUNTER — Ambulatory Visit (HOSPITAL_BASED_OUTPATIENT_CLINIC_OR_DEPARTMENT_OTHER): Payer: BC Managed Care – PPO

## 2023-01-26 ENCOUNTER — Other Ambulatory Visit: Payer: Self-pay | Admitting: *Deleted

## 2023-01-26 ENCOUNTER — Ambulatory Visit: Payer: BC Managed Care – PPO

## 2023-01-26 ENCOUNTER — Ambulatory Visit: Payer: BC Managed Care – PPO | Attending: Obstetrics and Gynecology | Admitting: *Deleted

## 2023-01-26 ENCOUNTER — Encounter: Payer: Self-pay | Admitting: *Deleted

## 2023-01-26 VITALS — BP 88/50 | HR 73

## 2023-01-26 DIAGNOSIS — Z369 Encounter for antenatal screening, unspecified: Secondary | ICD-10-CM | POA: Insufficient documentation

## 2023-01-26 DIAGNOSIS — O0990 Supervision of high risk pregnancy, unspecified, unspecified trimester: Secondary | ICD-10-CM

## 2023-01-26 DIAGNOSIS — O09292 Supervision of pregnancy with other poor reproductive or obstetric history, second trimester: Secondary | ICD-10-CM

## 2023-01-26 DIAGNOSIS — O99342 Other mental disorders complicating pregnancy, second trimester: Secondary | ICD-10-CM | POA: Diagnosis not present

## 2023-01-26 DIAGNOSIS — O321XX0 Maternal care for breech presentation, not applicable or unspecified: Secondary | ICD-10-CM | POA: Insufficient documentation

## 2023-01-26 DIAGNOSIS — O24112 Pre-existing diabetes mellitus, type 2, in pregnancy, second trimester: Secondary | ICD-10-CM | POA: Diagnosis not present

## 2023-01-26 DIAGNOSIS — F319 Bipolar disorder, unspecified: Secondary | ICD-10-CM | POA: Insufficient documentation

## 2023-01-26 DIAGNOSIS — Z3A20 20 weeks gestation of pregnancy: Secondary | ICD-10-CM | POA: Diagnosis not present

## 2023-01-26 DIAGNOSIS — Z8759 Personal history of other complications of pregnancy, childbirth and the puerperium: Secondary | ICD-10-CM | POA: Diagnosis not present

## 2023-01-31 ENCOUNTER — Ambulatory Visit (INDEPENDENT_AMBULATORY_CARE_PROVIDER_SITE_OTHER): Payer: BC Managed Care – PPO | Admitting: Obstetrics and Gynecology

## 2023-01-31 VITALS — BP 95/61 | HR 78 | Wt 159.0 lb

## 2023-01-31 DIAGNOSIS — Z8632 Personal history of gestational diabetes: Secondary | ICD-10-CM

## 2023-01-31 DIAGNOSIS — Z3A20 20 weeks gestation of pregnancy: Secondary | ICD-10-CM

## 2023-01-31 DIAGNOSIS — F3163 Bipolar disorder, current episode mixed, severe, without psychotic features: Secondary | ICD-10-CM

## 2023-01-31 DIAGNOSIS — O0990 Supervision of high risk pregnancy, unspecified, unspecified trimester: Secondary | ICD-10-CM

## 2023-01-31 DIAGNOSIS — Z8759 Personal history of other complications of pregnancy, childbirth and the puerperium: Secondary | ICD-10-CM

## 2023-01-31 NOTE — Progress Notes (Signed)
   PRENATAL VISIT NOTE  Subjective:  Cheryl Cohen is a 32 y.o. [phone] at [redacted]w[redacted]d being seen today for ongoing prenatal care.  She is currently monitored for the following issues for this high-risk pregnancy and has Abnormal cervical Papanicolaou smear; Bipolar affective disorder, mixed, severe, with psychotic behavior (HCC); Delta-9-tetrahydrocannabinol (THC) dependence (HCC); Generalized anxiety disorder; History of IUFD; Supervision of high risk pregnancy, antepartum; and History of gestational diabetes mellitus (GDM) on their problem list.  Patient reports no complaints.  Contractions: Not present. Vag. Bleeding: None.  Movement: Present. Denies leaking of fluid.   The following portions of the patient's history were reviewed and updated as appropriate: allergies, current medications, past family history, past medical history, past social history, past surgical history and problem list.   Objective:   Vitals:   01/31/23 1055  BP: 95/61  Pulse: 78  Weight: 159 lb (72.1 kg)    Fetal Status: Fetal Heart Rate (bpm): 135   Movement: Present     General:  Alert, oriented and cooperative. Patient is in no acute distress.  Skin: Skin is warm and dry. No rash noted.   Cardiovascular: Normal heart rate noted  Respiratory: Normal respiratory effort, no problems with respiration noted  Abdomen: Soft, gravid, appropriate for gestational age.  Pain/Pressure: Present     Pelvic: Cervical exam deferred        Extremities: Normal range of motion.  Edema: None  Mental Status: Normal mood and affect. Normal behavior. Normal judgment and thought content.   Assessment and Plan:  Pregnancy: [id number] at [redacted]w[redacted]d 1. Supervision of high risk pregnancy, antepartum [O09.90] BP and FHR normal  2. History of IUFD First pregnancy twins @25  weeks Follow up growth u/s 12/2  3. History of gestational diabetes mellitus (GDM) Early gtt coming up   4. Bipolar affective disorder, mixed, severe (HCC) Follow  by psychiatry, regular care. Doing abilify injections and propranolol prn   5. [redacted] weeks gestation of pregnancy   Preterm labor symptoms and general obstetric precautions including but not limited to vaginal bleeding, contractions, leaking of fluid and fetal movement were reviewed in detail with the patient. Please refer to After Visit Summary for other counseling recommendations.   Return in about 4 weeks (around 02/28/2023), or print work note, for OB VISIT (MD or APP).  Future Appointments  Date Time Provider Department Center  02/12/2023  8:00 AM [facility] LAB [facility] None  02/27/2023  3:50 PM Celedonio Savage, MD [facility] None  03/05/2023  8:30 AM [facility] US4 [facility]    Albertine Grates, FNP

## 2023-01-31 NOTE — Progress Notes (Signed)
Pt presents for ROB visit. Pt c/o increased vaginal discharge.

## 2023-02-05 ENCOUNTER — Encounter (HOSPITAL_COMMUNITY): Payer: Self-pay | Admitting: Obstetrics and Gynecology

## 2023-02-05 ENCOUNTER — Other Ambulatory Visit: Payer: Self-pay

## 2023-02-05 ENCOUNTER — Inpatient Hospital Stay (HOSPITAL_COMMUNITY)
Admission: AD | Admit: 2023-02-05 | Discharge: 2023-02-05 | Disposition: A | Discharge status: Home / Self Care | Payer: BC Managed Care – PPO | Attending: Obstetrics and Gynecology | Admitting: Obstetrics and Gynecology

## 2023-02-05 DIAGNOSIS — O98812 Other maternal infectious and parasitic diseases complicating pregnancy, second trimester: Secondary | ICD-10-CM | POA: Insufficient documentation

## 2023-02-05 DIAGNOSIS — Z3A21 21 weeks gestation of pregnancy: Secondary | ICD-10-CM | POA: Insufficient documentation

## 2023-02-05 DIAGNOSIS — Z0371 Encounter for suspected problem with amniotic cavity and membrane ruled out: Secondary | ICD-10-CM | POA: Diagnosis not present

## 2023-02-05 DIAGNOSIS — B3731 Acute candidiasis of vulva and vagina: Secondary | ICD-10-CM | POA: Insufficient documentation

## 2023-02-05 LAB — WET PREP, GENITAL
Clue Cells Wet Prep HPF POC: NONE SEEN
Sperm: NONE SEEN
Trich, Wet Prep: NONE SEEN
WBC, Wet Prep HPF POC: >=10 — AB (ref <10)

## 2023-02-05 LAB — URINALYSIS, ROUTINE W REFLEX MICROSCOPIC
Bilirubin Urine: NEGATIVE
Glucose, UA: NEGATIVE mg/dL
Hgb urine dipstick: NEGATIVE
Ketones, ur: NEGATIVE mg/dL
Nitrite: NEGATIVE
Protein, ur: NEGATIVE mg/dL
Specific Gravity, Urine: 1.019 (ref 1.005–1.030)
pH: 6 (ref 5.0–8.0)

## 2023-02-05 LAB — FETAL FIBRONECTIN: Fetal Fibronectin: NEGATIVE

## 2023-02-05 LAB — POCT FERN TEST: POCT Fern Test: NEGATIVE

## 2023-02-05 LAB — RUPTURE OF MEMBRANE (ROM)PLUS: Rom Plus: NEGATIVE

## 2023-02-05 MED ORDER — FLUCONAZOLE 150 MG PO TABS
150.0000 mg | ORAL_TABLET | Freq: Once | ORAL | Status: AC
  Administered 2023-02-05: 150 mg via ORAL
  Filled 2023-02-05: qty 1

## 2023-02-05 NOTE — MAU Provider Note (Signed)
Chief Complaint:  Rupture of Membranes   HPI  HPI: Cheryl Cohen is a 32 y.o. [phone] at 68w3dwho presents to maternity admissions reporting leakage of fluid for the past 2 days, clear and watery. Denies pain.   She reports good fetal movement, denies LOF, vaginal bleeding, vaginal itching/burning, urinary symptoms, h/a, dizziness, n/v, diarrhea, constipation or fever/chills.  She denies headache, visual changes or RUQ abdominal pain.   Past Medical History: Past Medical History:  Diagnosis Date   Anxiety    Bipolar 1 disorder (HCC)    hospitalized  3 x onset rx in 9th grade    Family history of diabetes mellitus type II 01/11/2012   Gestational diabetes    Gonorrhea    Hx of varicella    Psychotic disorder with delusions (HCC)    Suicidal ideation 10/06/2013   Syphilis    Twin pregnancy, delivered vaginally, IUFD stillborns 09/09/2015   IUFD  22 + weeks  still borns     Past obstetric history: OB History  Gravida Para Term Preterm AB Living  6 2 1 1 3 1   SAB IAB Ectopic Multiple Live Births  0 3 0 1 1    # Outcome Date GA Lbr Len/2nd Weight Sex Type Anes PTL Lv  6 Current           5 IAB 2023          4 Term 11/02/20 [redacted]w[redacted]d / 00:19 3314 g M Vag-Spont EPI  LIV  3 IAB 2022          2 IAB 06/25/17 [redacted]w[redacted]d         1A Preterm 09/09/15 [redacted]w[redacted]d 00:50 / 00:25 295 g F Vag-Spont None  FD  1B Preterm 09/09/15 [redacted]w[redacted]d 00:50 / 00:43 394 g F Vag-Spont None  FD    Past Surgical History: Past Surgical History:  Procedure Laterality Date   DILATION AND EVACUATION N/A 09/09/2015   Procedure: DILATATION AND EVACUATION;  Surgeon: [name], MD;  Location: [facility];  Service: Gynecology;  Laterality: N/A;   TYMPANOSTOMY TUBE PLACEMENT     asage 2-3 yrs    Family History: Family History  Problem Relation Age of Onset   Diabetes Mother    Arthritis Mother    Stroke Father    Hypertension Father    Alcohol abuse Father    Alcoholism Father     Social History: Social History    Tobacco Use   Smoking status: Former    Types: Cigars   Smokeless tobacco: Former   Tobacco comments:    1 cigarette a day  Advertising account planner   Vaping status: Never Used  Substance Use Topics   Alcohol use: Not Currently   Drug use: Not Currently    Types: Marijuana    Comment: Quit a few months ago    Allergies:  Allergies  Allergen Reactions   Amoxicillin Rash and Other (See Comments)    Has patient had a PCN reaction causing immediate rash, facial/tongue/throat swelling, SOB or lightheadedness with hypotension: yes Has patient had a PCN reaction causing severe rash involving mucus membranes or skin necrosis: No Has patient had a PCN reaction that required hospitalization: No Has patient had a PCN reaction occurring within the last 10 years: Yes If all of the above answers are "NO", then may proceed with Cephalosporin use.     Meds:  Medications Prior to Admission  Medication Sig Dispense Refill Last Dose   ARIPiprazole ER (ABILIFY MAINTENA) 400 MG SRER injection  Inject 2 mLs (400 mg total) into the muscle every 28 (twenty-eight) days. (Due on 10-13-22): For mood stability 1 each 0 Past Month   Prenatal Vit-Fe Fumarate-FA (PRENATAL VITAMIN PO) Take 1 tablet by mouth daily.   02/04/2023   propranolol (INDERAL) 10 MG tablet Take 1 tablet (10 mg total) by mouth 2 (two) times daily. For anxiety 60 tablet 0 Past Month   ARIPiprazole (ABILIFY) 20 MG tablet Take 1 tablet (20 mg total) by mouth at bedtime. For mood stability. (Patient not taking: Reported on 10/25/2022) 7 tablet 0    benzocaine (ORAJEL) 10 % mucosal gel Use as directed in the mouth or throat 2 (two) times daily as needed for mouth pain. (Patient not taking: Reported on 12/06/2022) 5.3 g 0    hydrOXYzine (ATARAX) 25 MG tablet Take 1 tablet (25 mg total) by mouth 3 (three) times daily as needed for anxiety. (Patient not taking: Reported on 10/25/2022) 75 tablet 0    lithium carbonate (ESKALITH) 450 MG ER tablet Take 1 tablet (450  mg total) by mouth at bedtime. For mood stabilization. (Patient not taking: Reported on 11/21/2022) 30 tablet 0    traZODone (DESYREL) 50 MG tablet Take 1 tablet (50 mg total) by mouth at bedtime as needed for sleep. (Patient not taking: Reported on 10/25/2022) 30 tablet 0     I have reviewed patient's Past Medical Hx, Surgical Hx, Family Hx, Social Hx, medications and allergies.   ROS:  Review of Systems Other systems negative  Physical Exam  Patient Vitals for the past 24 hrs:  BP Temp Temp src Pulse Resp SpO2 Height Weight  02/05/23 2157 -- -- -- -- -- 98 % -- --  02/05/23 2136 (!) 100/55 98.1 F (36.7 C) Oral 78 16 98 % 5\' 8"  (1.727 m) 74 kg   Constitutional: Well-developed, well-nourished female in no acute distress.  Cardiovascular: normal rate and rhythm Respiratory: normal effort, clear to auscultation bilaterally GI: Abd soft, non-tender, gravid appropriate for gestational age.   No rebound or guarding. MS: Extremities nontender, no edema, normal ROM Neurologic: Alert and oriented x 4.  GU: Neg CVAT.  PELVIC EXAM: Cervix pink, visually closed, without lesion, much white chunky discharge, vaginal walls and external genitalia normal Bimanual exam: Cervix firm, posterior, neg CMT, uterus nontender, Fundal Height consistent with dates, adnexa without tenderness, enlargement, or mass  FHT:  140    Labs: Results for orders placed or performed during the hospital encounter of 02/05/23 (from the past 24 hour(s))  Urinalysis, Routine w reflex microscopic -Urine, Clean Catch     Status: Abnormal   Collection Time: 02/05/23  9:40 PM  Result Value Ref Range   Color, Urine YELLOW YELLOW   APPearance HAZY (A) CLEAR   Specific Gravity, Urine 1.019 1.005 - 1.030   pH 6.0 5.0 - 8.0   Glucose, UA NEGATIVE NEGATIVE mg/dL   Hgb urine dipstick NEGATIVE NEGATIVE   Bilirubin Urine NEGATIVE NEGATIVE   Ketones, ur NEGATIVE NEGATIVE mg/dL   Protein, ur NEGATIVE NEGATIVE mg/dL   Nitrite  NEGATIVE NEGATIVE   Leukocytes,Ua MODERATE (A) NEGATIVE   RBC / HPF 0-5 0 - 5 RBC/hpf   WBC, UA 11-20 0 - 5 WBC/hpf   Bacteria, UA FEW (A) NONE SEEN   Squamous Epithelial / HPF 0-5 0 - 5 /HPF   Mucus PRESENT   Wet prep, genital     Status: Abnormal   Collection Time: 02/05/23 10:33 PM   Specimen: PATH Cytology Cervicovaginal Ancillary Only  Result Value Ref Range   Yeast Wet Prep HPF POC PRESENT (A) NONE SEEN   Trich, Wet Prep NONE SEEN NONE SEEN   Clue Cells Wet Prep HPF POC NONE SEEN NONE SEEN   WBC, Wet Prep HPF POC >=10 (A) <10   Sperm NONE SEEN   Rupture of Membrane (ROM) Plus     Status: None   Collection Time: 02/05/23 10:33 PM  Result Value Ref Range   Rom Plus NEGATIVE   Fetal fibronectin     Status: None   Collection Time: 02/05/23 10:33 PM  Result Value Ref Range   Fetal Fibronectin NEGATIVE NEGATIVE  Fern Test     Status: Normal   Collection Time: 02/05/23 10:48 PM  Result Value Ref Range   POCT Fern Test Negative = intact amniotic membranes    B/Positive/-- (08/20 1151)  Imaging:  [place] MFM OB DETAIL +14 WK  Result Date: 01/26/2023 ----------------------------------------------------------------------  OBSTETRICS REPORT                       (Signed Final 01/26/2023 10:59 am) ---------------------------------------------------------------------- Patient Info  ID #:       [id number]                          D.O.B.:  1990/07/19 (31 yrs)  Name:       Cheryl Cohen              Visit Date: 01/26/2023 09:19 am ---------------------------------------------------------------------- Performed By  Attending:        Ma Rings MD         Ref. Address:     [address]                                                             [address]                                                             [place]                                                             [id number]  Performed By:     Percell Boston           [facility]  Referred By:      [facility] ---------------------------------------------------------------------- Orders  #  Description                           Code        Ordered By  1  [place] MFM OB DETAIL +14 WK               76811.01    MICHAEL ERVIN ----------------------------------------------------------------------  #  Order #                     Accession #                Episode #  1  [id number]                   [id number]                 [id number] ---------------------------------------------------------------------- Indications  Poor obstetric history: Previous gestational   O09.299  diabetes  Poor obstetric history: Previous IUFD          O09.299  (stillbirth)  Bipolar disease during pregnancy               O99.340 F31.9  LR NIPS 7.4FF, AFP neg, Horizon neg  Encounter for antenatal screening,             Z36.9  unspecified  [redacted] weeks gestation of pregnancy                Z3A.20 ---------------------------------------------------------------------- Fetal Evaluation  Num Of Fetuses:         1  Fetal Heart Rate(bpm):  143  Cardiac Activity:       Observed  Presentation:           Breech  Placenta:               Posterior  P. Cord Insertion:      Visualized, central  Amniotic Fluid  AFI FV:      Within normal limits                              Largest Pocket(cm)                              4.4 ---------------------------------------------------------------------- Biometry  BPD:      45.2  mm     G. Age:  19w 5d         35  %    CI:        73.21   %    70 - 86  FL/HC:      18.7   %    16.8 - 19.8  HC:      167.9  mm     G. Age:  19w 3d         19  %    HC/AC:      1.04        1.09 - 1.39  AC:      160.9  mm     G.  Age:  21w 1d         81  %    FL/BPD:     69.5   %  FL:       31.4  mm     G. Age:  19w 5d         33  %    FL/AC:      19.5   %    20 - 24  HUM:      33.3  mm     G. Age:  21w 2d         87  %  Est. FW:     351  gm    0 lb 12 oz      68  % ---------------------------------------------------------------------- OB History  Gravidity:    7         Term:   1        Prem:   2        SAB:   0  TOP:          3       Ectopic:  0        Living: 1 ---------------------------------------------------------------------- Gestational Age  LMP:           18w 5d        Date:  09/17/22                  EDD:   06/24/23  U/S Today:     20w 0d                                        EDD:   06/15/23  Best:          Cherylann Parr 0d     Det. By:  U/S C R L  (11/21/22)    EDD:   06/15/23 ---------------------------------------------------------------------- Targeted Anatomy  Central Nervous System  Calvarium/Cranial V.:  Appears normal         Cereb./Vermis:          Appears normal  Cavum:                 Appears normal         Cisterna Magna:         Appears normal  Lateral Ventricles:    Appears normal         Midline Falx:           Appears normal  Choroid Plexus:        Appears normal  Spine  Cervical:              Appears normal         Sacral:                 Appears normal  Thoracic:              Appears normal  Shape/Curvature:        Appears normal  Lumbar:                Appears normal  Head/Neck  Face:                  Appears normal         Profile:                Appears normal  Lips:                  Appears normal         Orbits/Eyes:            Appears normal  Neck:                  Not well visualized    Mandible:               Appears normal  Nuchal Fold:           Appears normal         Maxilla:                Appears normal  Nasal Bone:            Present  Thorax  4 Chamber View:        Appears normal         SVC:                    Appears normal  Cardiac Activity:      Observed               Interventr. Septum:     Appears  normal  Cardiac Rhythm:        Normal                 Cardiac Axis:           Normal  Cardiac Situs:         Appears normal         Diaphragm:              Appears normal  Rt Outflow Tract:      Appears normal         3 Vessel View:          Appears normal  Lt Outflow Tract:      Appears normal         3 V Trachea View:       Appears normal  Aortic Arch:           Appears normal         IVC:                    Appears normal  Ductal Arch:           Appears normal         Crossing:               Appears normal  Abdomen  Ventral Wall:          Appears normal         Lt Kidney:              Appears normal  Cord Insertion:        Appears normal         Rt Kidney:              Appears normal  Situs:                 Appears normal         Bladder:                Appears normal  Stomach:               Appears normal  Extremities  Lt Humerus:            Appears normal         Lt Femur:               Appears normal  Rt Humerus:            Appears normal         Rt Femur:               Appears normal  Lt Forearm:            Appears normal         Lt Lower Leg:           Appears normal  Rt Forearm:            Appears normal         Rt Lower Leg:           Appears normal  Lt Hand:               Open hand nml          Lt Foot:                Nml heel/foot  Rt Hand:               Open hand nml          Rt Foot:                Nml heel/foot  Other  Umbilical Cord:        Normal 3-vessel ---------------------------------------------------------------------- Cervix Uterus Adnexa  Cervix  Length:            3.5  cm.  Normal appearance by transabdominal scan  Uterus  No abnormality visualized.  Right Ovary  Not visualized.  Left Ovary  Not visualized.  Cul De Sac  No free fluid seen.  Adnexa  No abnormality visualized ---------------------------------------------------------------------- Comments  This patient was seen for a detailed fetal anatomy scan due  to a history of bipolar disorder and generalized anxiety  disorder.  She is  currently treated with Abilify and takes  propranolol as needed for anxiety.  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  She denies any problems in her current pregnancy.  She had a cell free DNA test earlier in her pregnancy which  indicated a low risk for trisomy 25, 54, and 13. A female fetus  is predicted.  She was informed that the fetal growth and amniotic fluid  level were appropriate for her gestational age.  There were no obvious fetal anomalies noted on today's  ultrasound exam.  The patient was informed that anomalies may be missed due  to technical limitations. If the fetus is in a suboptimal position  or maternal habitus is increased, visualization of the fetus in  the maternal uterus may be impaired.  Due to her prior poor obstetrical history, we will continue to  follow her with growth ultrasounds throughout her pregnancy.  A follow-up exam was scheduled in 5 weeks. ----------------------------------------------------------------------                   Ma Rings, MD Electronically Signed Final Report   01/26/2023 10:59 am ----------------------------------------------------------------------    MAU Course/MDM: I have reviewed the triage vital signs and the nursing notes.   Pertinent labs & imaging results that were available during my care of the patient were reviewed by me and considered in my medical decision making (see chart for details).      I have reviewed her medical records including past results, notes and treatments.   I have ordered labs and reviewed results.  NST reviewed  Treatments in MAU included wet prep, GC/Chlamydia swab, FFN, fern, and ROM+.    Assessment: 1. [redacted] weeks gestation of pregnancy   2. Candida vaginitis   Single dose 150 mg Diflucan given prior to discharge.  All other swabs negative.   Plan: Discharge home Labor  precautions and fetal kick counts Follow up in Office for prenatal visits and recheck   Pt stable at time of discharge.  Wyn Forster, MD FMOB Fellow, Faculty practice [facility], Center for [facility]  02/05/2023 11:27 PM

## 2023-02-05 NOTE — MAU Note (Signed)
.  Cheryl Cohen is a 32 y.o. at [redacted]w[redacted]d here in MAU reporting: leaking fluid for the past 2 days - clear and watery. Denies VB or pain. +FM   Onset of complaint: 11/2 Pain score: 0 Vitals:   02/05/23 2136  BP: (!) 100/55  Pulse: 78  Resp: 16  Temp: 98.1 F (36.7 C)  SpO2: 98%     FHT:140 Lab orders placed from triage:  fern; UA

## 2023-02-06 LAB — GC/CHLAMYDIA PROBE AMP (~~LOC~~) NOT AT ARMC
Chlamydia: NEGATIVE
Comment: NEGATIVE
Comment: NORMAL
Neisseria Gonorrhea: NEGATIVE

## 2023-02-12 ENCOUNTER — Other Ambulatory Visit: Payer: BC Managed Care – PPO

## 2023-02-12 DIAGNOSIS — F909 Attention-deficit hyperactivity disorder, unspecified type: Secondary | ICD-10-CM | POA: Diagnosis not present

## 2023-02-12 DIAGNOSIS — F310 Bipolar disorder, current episode hypomanic: Secondary | ICD-10-CM | POA: Diagnosis not present

## 2023-02-14 ENCOUNTER — Other Ambulatory Visit: Payer: BC Managed Care – PPO

## 2023-02-14 DIAGNOSIS — Z8632 Personal history of gestational diabetes: Secondary | ICD-10-CM

## 2023-02-15 LAB — GLUCOSE TOLERANCE, 2 HOURS W/ 1HR
Glucose, 1 hour: 118 mg/dL (ref 70–179)
Glucose, 2 hour: 96 mg/dL (ref 70–152)
Glucose, Fasting: 86 mg/dL (ref 70–91)

## 2023-02-27 ENCOUNTER — Ambulatory Visit (INDEPENDENT_AMBULATORY_CARE_PROVIDER_SITE_OTHER): Payer: BC Managed Care – PPO | Admitting: Family Medicine

## 2023-02-27 ENCOUNTER — Other Ambulatory Visit (HOSPITAL_COMMUNITY)
Admission: RE | Admit: 2023-02-27 | Discharge: 2023-02-27 | Disposition: A | Discharge status: Home / Self Care | Payer: BC Managed Care – PPO | Source: Ambulatory Visit | Attending: Family Medicine | Admitting: Family Medicine

## 2023-02-27 VITALS — BP 105/62 | HR 84 | Wt 167.9 lb

## 2023-02-27 DIAGNOSIS — Z3A24 24 weeks gestation of pregnancy: Secondary | ICD-10-CM | POA: Insufficient documentation

## 2023-02-27 DIAGNOSIS — O26892 Other specified pregnancy related conditions, second trimester: Secondary | ICD-10-CM

## 2023-02-27 DIAGNOSIS — B9689 Other specified bacterial agents as the cause of diseases classified elsewhere: Secondary | ICD-10-CM | POA: Insufficient documentation

## 2023-02-27 DIAGNOSIS — Z8759 Personal history of other complications of pregnancy, childbirth and the puerperium: Secondary | ICD-10-CM

## 2023-02-27 DIAGNOSIS — N898 Other specified noninflammatory disorders of vagina: Secondary | ICD-10-CM | POA: Diagnosis present

## 2023-02-27 DIAGNOSIS — Z8632 Personal history of gestational diabetes: Secondary | ICD-10-CM

## 2023-02-27 DIAGNOSIS — O23592 Infection of other part of genital tract in pregnancy, second trimester: Secondary | ICD-10-CM | POA: Diagnosis not present

## 2023-02-27 DIAGNOSIS — B3731 Acute candidiasis of vulva and vagina: Secondary | ICD-10-CM | POA: Insufficient documentation

## 2023-02-27 DIAGNOSIS — O0990 Supervision of high risk pregnancy, unspecified, unspecified trimester: Secondary | ICD-10-CM

## 2023-02-27 DIAGNOSIS — O98812 Other maternal infectious and parasitic diseases complicating pregnancy, second trimester: Secondary | ICD-10-CM | POA: Insufficient documentation

## 2023-02-27 NOTE — Progress Notes (Signed)
C/O yellow white discharge

## 2023-02-27 NOTE — Progress Notes (Signed)
   PRENATAL VISIT NOTE  Subjective:  Cheryl Cohen is a 32 y.o. [phone] at [redacted]w[redacted]d being seen today for ongoing prenatal care.  She is currently monitored for the following issues for this high-risk pregnancy and has Abnormal cervical Papanicolaou smear; Bipolar affective disorder, mixed, severe, with psychotic behavior (HCC); Delta-9-tetrahydrocannabinol (THC) dependence (HCC); Generalized anxiety disorder; History of IUFD; Supervision of high risk pregnancy, antepartum; and History of gestational diabetes mellitus (GDM) on their problem list.  Patient reports no bleeding, no contractions, no cramping, and vaginal irritation.  Contractions: Not present. Vag. Bleeding: None.  Movement: Present. Denies leaking of fluid.   The following portions of the patient's history were reviewed and updated as appropriate: allergies, current medications, past family history, past medical history, past social history, past surgical history and problem list.   Objective:   Vitals:   02/27/23 1606  BP: 105/62  Pulse: 84  Weight: 167 lb 14.4 oz (76.2 kg)    Fetal Status: Fetal Heart Rate (bpm): 141   Movement: Present     General:  Alert, oriented and cooperative. Patient is in no acute distress.  Skin: Skin is warm and dry. No rash noted.   Cardiovascular: Normal heart rate noted  Respiratory: Normal respiratory effort, no problems with respiration noted  Abdomen: Soft, gravid, appropriate for gestational age.  Pain/Pressure: Absent     Pelvic: Cervical exam deferred        Extremities: Normal range of motion.  Edema: None  Mental Status: Normal mood and affect. Normal behavior. Normal judgment and thought content.   Assessment and Plan:  Pregnancy: [id number] at [redacted]w[redacted]d 1. Supervision of high risk pregnancy, antepartum [O09.90] FHR and BP appropriate today\ Continue routine prenatal care  2. History of gestational diabetes mellitus (GDM) Patient had normal early GCT Patient will be scheduled for  28-week GTT at next visit  3. History of IUFD At 25 weeks with twins and patient's first pregnancy Patient has growth ultrasound scheduled for 12/2  4. [redacted] weeks gestation of pregnancy  5. Vaginal discharge during pregnancy in second trimester Patient reports increased vaginal discharge.  May be physiologic discharge of pregnancy but could also be infection.  Self swab collected today. - Cervicovaginal ancillary only( [place])  Preterm labor symptoms and general obstetric precautions including but not limited to vaginal bleeding, contractions, leaking of fluid and fetal movement were reviewed in detail with the patient. Please refer to After Visit Summary for other counseling recommendations.   No follow-ups on file.  Future Appointments  Date Time Provider Department Center  03/05/2023  8:30 AM [facility] US[facility]  03/29/2023  8:30 AM [facility] None  03/29/2023  8:55 AM Warden Fillers, MD [facility] None    Celedonio Savage, MD

## 2023-02-28 ENCOUNTER — Telehealth: Payer: Self-pay | Admitting: Licensed Clinical Social Worker

## 2023-02-28 NOTE — Telephone Encounter (Signed)
[facility] contacted patient on this date to provide [facility] intro and schedule [facility] appointment. Appt scheduled for 03/15/2023.

## 2023-03-02 LAB — CERVICOVAGINAL ANCILLARY ONLY
Bacterial Vaginitis (gardnerella): POSITIVE — AB
Candida Glabrata: NEGATIVE
Candida Vaginitis: POSITIVE — AB
Chlamydia: NEGATIVE
Comment: NEGATIVE
Comment: NEGATIVE
Comment: NEGATIVE
Comment: NEGATIVE
Comment: NEGATIVE
Comment: NORMAL
Neisseria Gonorrhea: NEGATIVE
Trichomonas: NEGATIVE

## 2023-03-05 ENCOUNTER — Other Ambulatory Visit: Payer: Self-pay | Admitting: *Deleted

## 2023-03-05 ENCOUNTER — Ambulatory Visit: Payer: BC Managed Care – PPO | Attending: Obstetrics

## 2023-03-05 ENCOUNTER — Other Ambulatory Visit: Payer: Self-pay

## 2023-03-05 DIAGNOSIS — O09299 Supervision of pregnancy with other poor reproductive or obstetric history, unspecified trimester: Secondary | ICD-10-CM

## 2023-03-05 DIAGNOSIS — O09292 Supervision of pregnancy with other poor reproductive or obstetric history, second trimester: Secondary | ICD-10-CM | POA: Insufficient documentation

## 2023-03-05 DIAGNOSIS — Z8759 Personal history of other complications of pregnancy, childbirth and the puerperium: Secondary | ICD-10-CM

## 2023-03-05 DIAGNOSIS — Z3A25 25 weeks gestation of pregnancy: Secondary | ICD-10-CM

## 2023-03-05 DIAGNOSIS — F319 Bipolar disorder, unspecified: Secondary | ICD-10-CM | POA: Diagnosis not present

## 2023-03-05 DIAGNOSIS — O99342 Other mental disorders complicating pregnancy, second trimester: Secondary | ICD-10-CM | POA: Diagnosis not present

## 2023-03-05 MED ORDER — METRONIDAZOLE 500 MG PO TABS: 500.0000 mg | ORAL_TABLET | Freq: Two times a day (BID) | ORAL | 0 refills | Status: DC

## 2023-03-05 MED ORDER — TERCONAZOLE 0.4 % VA CREA: 1.0000 | TOPICAL_CREAM | Freq: Every day | VAGINAL | 0 refills | Status: DC

## 2023-03-05 NOTE — Progress Notes (Signed)
Flagyl and Terazol sent for recent +BV and yeast. See lab results

## 2023-03-06 MED ORDER — FLUCONAZOLE 150 MG PO TABS: 150.0000 mg | ORAL_TABLET | Freq: Once | ORAL | 0 refills | Status: AC

## 2023-03-06 NOTE — Addendum Note (Signed)
Addended by: Celedonio Savage on: 03/06/2023 09:44 AM   Modules accepted: Orders

## 2023-03-15 ENCOUNTER — Ambulatory Visit (INDEPENDENT_AMBULATORY_CARE_PROVIDER_SITE_OTHER): Payer: MEDICAID | Admitting: Licensed Clinical Social Worker

## 2023-03-15 DIAGNOSIS — F3163 Bipolar disorder, current episode mixed, severe, without psychotic features: Secondary | ICD-10-CM | POA: Diagnosis not present

## 2023-03-15 NOTE — BH Specialist Note (Signed)
Integrated Behavioral Health via Telemedicine Visit  03/20/2023 Cheryl Cohen [id number]  Number of Integrated Behavioral Health Clinician visits: 1- Initial Visit  Session Start time: 1330   Session End time: 1413  Total time in minutes: 43   Referring Provider: Albertine Cohen Patient/Family location: At home [facility] Provider location: At [facility] All persons participating in visit: Patient and Cheryl Cohen Types of Service: Family psychotherapy and Video visit  I connected with Cheryl Cohen and/or Cheryl Cohen's patient via  Telephone or Video Enabled Telemedicine Application  (Video is Caregility application) and verified that I am speaking with the correct person using two identifiers. Discussed confidentiality: Yes   I discussed the limitations of telemedicine and the availability of in person appointments.  Discussed there is a possibility of technology failure and discussed alternative modes of communication if that failure occurs.  I discussed that engaging in this telemedicine visit, they consent to the provision of behavioral healthcare and the services will be billed under their insurance.  Patient and/or legal guardian expressed understanding and consented to Telemedicine visit: Yes   Presenting Concerns: Patient and/or family reports the following symptoms/concerns: Increased anxiety symptoms during pregnancy.  Duration of problem: Months; Severity of problem: moderate  Patient and/or Family's Strengths/Protective Factors: Social and Emotional competence, Concrete supports in place (healthy food, safe environments, etc.), and Physical Health (exercise, healthy diet, medication compliance, etc.)  Goals Addressed: Patient will:  Reduce symptoms of: anxiety   Increase knowledge and/or ability of: coping skills and healthy habits   Demonstrate ability to: Increase healthy adjustment to current life circumstances  Progress towards  Goals: Ongoing  Interventions: Interventions utilized:  Mindfulness or Management consultant, Supportive Counseling, Psychoeducation and/or Health Education, and Supportive Reflection Standardized Assessments completed: Not Needed  Patient and/or Family Response: Patient attended today's virtual session and shared updates about her current circumstances.  She reports being pregnant with a baby girl, due in March 2025, and is also the mother of a two year old son. Patient expresses feelings of nervousness and anxiety about preparing for her baby's arrival, compounded by her current unemployment and ongoing search for a two-bedroom apartment to provide her family with adequate space.  Patient reports a diagnosis of bipolar affective disorder and reports being compliant with her treatment regiment, including a monthly injection. She states the medication is highly effective, enabling her to maintain healthy habits, positive thoughts and effective coping strategies.  Patient demonstrated resilience and optimism, focusing on managing her anxiety by taking things one day at a time and maintaining positive thoughts. She is actively seeking employment, including work from home opportunities or a position at her son's daycare.     Assessment: Patient currently experiencing increased nervousness and anxiety related to preparing for the arrival of her baby girl while managing her current responsibilities as a mother to her 31 year old son. She is also facing challenges with unemployment and searching for a 2 bedroom apartment. Despite these stressors, patient reports stability in her mental health due to medication compliance and maintains a positive outlook, focusing on managing her anxiety one day at a time.   Patient may benefit from continued support from integrated behavioral health to support healthy adjustment.  Plan: Follow up with behavioral health clinician on : 04/02/2023 Behavioral recommendations:  [name] is encouraged to continue medication compliance and utilizing current coping strategies. Focus on what you can control. Remember to take it one day at a time. Continue positive affirmations and thinking positively.  Referral(s): Integrated  Behavioral Health Services (In Clinic)  I discussed the assessment and treatment plan with the patient and/or parent/guardian. They were provided an opportunity to ask questions and all were answered. They agreed with the plan and demonstrated an understanding of the instructions.   They were advised to call back or seek an in-person evaluation if the symptoms worsen or if the condition fails to improve as anticipated.  Cheryl Cohen, LCSWA

## 2023-03-29 ENCOUNTER — Other Ambulatory Visit: Payer: 59

## 2023-03-29 ENCOUNTER — Other Ambulatory Visit (HOSPITAL_COMMUNITY)
Admission: RE | Admit: 2023-03-29 | Discharge: 2023-03-29 | Disposition: A | Discharge status: Home / Self Care | Payer: MEDICAID | Source: Ambulatory Visit | Attending: Obstetrics and Gynecology | Admitting: Obstetrics and Gynecology

## 2023-03-29 ENCOUNTER — Ambulatory Visit (INDEPENDENT_AMBULATORY_CARE_PROVIDER_SITE_OTHER): Payer: 59 | Admitting: Obstetrics and Gynecology

## 2023-03-29 VITALS — BP 89/59 | HR 106 | Wt 173.0 lb

## 2023-03-29 DIAGNOSIS — Z23 Encounter for immunization: Secondary | ICD-10-CM

## 2023-03-29 DIAGNOSIS — O26893 Other specified pregnancy related conditions, third trimester: Secondary | ICD-10-CM | POA: Insufficient documentation

## 2023-03-29 DIAGNOSIS — N898 Other specified noninflammatory disorders of vagina: Secondary | ICD-10-CM

## 2023-03-29 DIAGNOSIS — Z8632 Personal history of gestational diabetes: Secondary | ICD-10-CM

## 2023-03-29 DIAGNOSIS — O0990 Supervision of high risk pregnancy, unspecified, unspecified trimester: Secondary | ICD-10-CM

## 2023-03-29 DIAGNOSIS — Z8759 Personal history of other complications of pregnancy, childbirth and the puerperium: Secondary | ICD-10-CM

## 2023-03-29 DIAGNOSIS — Z3A28 28 weeks gestation of pregnancy: Secondary | ICD-10-CM

## 2023-03-29 NOTE — Progress Notes (Addendum)
Pt recently treated for BV/yeast - pt still having some irritation. Pt would like swab today. Pt is having some right side hip pain.  Flu and Tdap given today.   PHQ9 score 3. GAD score 1.

## 2023-03-29 NOTE — Progress Notes (Signed)
   PRENATAL VISIT NOTE  Subjective:  Cheryl Cohen is a 32 y.o. [phone] at [redacted]w[redacted]d being seen today for ongoing prenatal care.  She is currently monitored for the following issues for this high-risk pregnancy and has Abnormal cervical Papanicolaou smear; Bipolar affective disorder, mixed, severe, with psychotic behavior (HCC); Delta-9-tetrahydrocannabinol (THC) dependence (HCC); Generalized anxiety disorder; History of IUFD; Supervision of high risk pregnancy, antepartum; and History of gestational diabetes mellitus (GDM) on their problem list.  Patient doing well with no acute concerns today. She reports no complaints.  Contractions: Not present. Vag. Bleeding: None.  Movement: Present. Denies leaking of fluid.   The following portions of the patient's history were reviewed and updated as appropriate: allergies, current medications, past family history, past medical history, past social history, past surgical history and problem list. Problem list updated.  Objective:   Vitals:   03/29/23 0841  BP: (!) 89/59  Pulse: (!) 106  Weight: 173 lb (78.5 kg)    Fetal Status: Fetal Heart Rate (bpm): 140 Fundal Height: 29 cm Movement: Present     General:  Alert, oriented and cooperative. Patient is in no acute distress.  Skin: Skin is warm and dry. No rash noted.   Cardiovascular: Normal heart rate noted  Respiratory: Normal respiratory effort, no problems with respiration noted  Abdomen: Soft, gravid, appropriate for gestational age.  Pain/Pressure: Present     Pelvic: Cervical exam deferred        Extremities: Normal range of motion.     Mental Status:  Normal mood and affect. Normal behavior. Normal judgment and thought content.   Assessment and Plan:  Pregnancy: [id number] at [redacted]w[redacted]d  1. Supervision of high risk pregnancy, antepartum (Primary) Continue routine prenatal care  - Glucose Tolerance, 2 Hours w/1 Hour - RPR - CBC - HIV antibody (with reflex) - Tdap vaccine greater than or  equal to 7yo IM - Flu vaccine trivalent PF, 6mos and older(Flulaval,Afluria,Fluarix,Fluzone)  2. [redacted] weeks gestation of pregnancy  - Glucose Tolerance, 2 Hours w/1 Hour - RPR - CBC - HIV antibody (with reflex) - Tdap vaccine greater than or equal to 7yo IM - Flu vaccine trivalent PF, 6mos and older(Flulaval,Afluria,Fluarix,Fluzone)  3. History of gestational diabetes mellitus (GDM) 2 hour GTT today  4. History of IUFD Testing per MFM  5. Vaginal discharge during pregnancy in third trimester TOC from BV and yeast earlier in the month per pt request - Cervicovaginal ancillary only( )  Preterm labor symptoms and general obstetric precautions including but not limited to vaginal bleeding, contractions, leaking of fluid and fetal movement were reviewed in detail with the patient.  Please refer to After Visit Summary for other counseling recommendations.   Return in about 2 weeks (around 04/12/2023) for [facility], in person.   Mariel Aloe, MD Faculty Attending Center for [facility]

## 2023-03-30 LAB — CBC
Hematocrit: 33 % — ABNORMAL LOW (ref 34.0–46.6)
Hemoglobin: 10.8 g/dL — ABNORMAL LOW (ref 11.1–15.9)
MCH: 29.4 pg (ref 26.6–33.0)
MCHC: 32.7 g/dL (ref 31.5–35.7)
MCV: 90 fL (ref 79–97)
Platelets: 242 10*3/uL (ref 150–450)
RBC: 3.67 x10E6/uL — ABNORMAL LOW (ref 3.77–5.28)
RDW: 12.1 % (ref 11.7–15.4)
WBC: 12.8 10*3/uL — ABNORMAL HIGH (ref 3.4–10.8)

## 2023-03-30 LAB — CERVICOVAGINAL ANCILLARY ONLY
Bacterial Vaginitis (gardnerella): POSITIVE — AB
Candida Glabrata: NEGATIVE
Candida Vaginitis: POSITIVE — AB
Comment: NEGATIVE
Comment: NEGATIVE
Comment: NEGATIVE
Comment: NEGATIVE
Trichomonas: NEGATIVE

## 2023-03-30 LAB — HIV ANTIBODY (ROUTINE TESTING W REFLEX): HIV Screen 4th Generation wRfx: NONREACTIVE

## 2023-03-30 LAB — GLUCOSE TOLERANCE, 2 HOURS W/ 1HR
Glucose, 1 hour: 124 mg/dL (ref 70–179)
Glucose, 2 hour: 86 mg/dL (ref 70–152)
Glucose, Fasting: 85 mg/dL (ref 70–91)

## 2023-03-30 LAB — RPR: RPR Ser Ql: NONREACTIVE

## 2023-04-02 ENCOUNTER — Ambulatory Visit: Payer: BC Managed Care – PPO | Admitting: Licensed Clinical Social Worker

## 2023-04-02 ENCOUNTER — Encounter: Payer: Self-pay | Admitting: *Deleted

## 2023-04-02 NOTE — BH Specialist Note (Signed)
Patient no showed for today's visit. Virtual link sent to patient's phone and a phone call was provided. A VM was left.

## 2023-04-04 NOTE — L&D Delivery Note (Signed)
 OB/GYN Faculty Practice Delivery Note  Cheryl Cohen is a 33 y.o. [id number] s/p SVD at [redacted]w[redacted]d. She was admitted for SOL.   ROM: 1h 20m with light meconium fluid GBS Status:  Positive/-- (02/17 1514) Maximum Maternal Temperature: 98.20F  Labor Progress: Initial SVE: 5/70/-2. Augmented with AROM and Pitocin. Epidural. She then progressed to complete @1201 .   Delivery Date/Time: 06/09/2023 @1215   Delivery: Called to room and patient was complete and pushing. Head delivered ROA. No nuchal cord present. Shoulder and body delivered in usual fashion. Infant with spontaneous cry, placed on mother's abdomen, dried and stimulated. Cord clamped x 2 after 1-minute delay, and cut by FOB. Cord blood drawn. Placenta delivered spontaneously with gentle cord traction. Fundus firm with massage and Pitocin. Labia, perineum, vagina, and cervix inspected without lesion.   Post placental Mirena IUD placed  Baby Weight: pending  Placenta: 3 vessel, intact. Sent to L&D Complications: None Lacerations: None EBL: 35 mL Analgesia: Epidural   Infant:  APGAR (1 MIN): 8  APGAR (5 MINS): 9   Wyn Forster, MD OB Family Medicine Fellow, [facility] for [facility], [facility] 06/09/2023, 12:36 PM

## 2023-04-09 ENCOUNTER — Ambulatory Visit: Payer: BC Managed Care – PPO | Admitting: Licensed Clinical Social Worker

## 2023-04-09 ENCOUNTER — Ambulatory Visit (INDEPENDENT_AMBULATORY_CARE_PROVIDER_SITE_OTHER): Payer: 59 | Admitting: *Deleted

## 2023-04-09 ENCOUNTER — Other Ambulatory Visit (HOSPITAL_COMMUNITY)
Admission: RE | Admit: 2023-04-09 | Discharge: 2023-04-09 | Disposition: A | Discharge status: Home / Self Care | Payer: 59 | Source: Ambulatory Visit | Attending: Obstetrics and Gynecology | Admitting: Obstetrics and Gynecology

## 2023-04-09 VITALS — BP 101/67 | HR 86

## 2023-04-09 DIAGNOSIS — N760 Acute vaginitis: Secondary | ICD-10-CM | POA: Diagnosis not present

## 2023-04-09 DIAGNOSIS — N898 Other specified noninflammatory disorders of vagina: Secondary | ICD-10-CM | POA: Insufficient documentation

## 2023-04-09 DIAGNOSIS — B3731 Acute candidiasis of vulva and vagina: Secondary | ICD-10-CM | POA: Insufficient documentation

## 2023-04-09 DIAGNOSIS — B9689 Other specified bacterial agents as the cause of diseases classified elsewhere: Secondary | ICD-10-CM | POA: Diagnosis not present

## 2023-04-09 DIAGNOSIS — O0990 Supervision of high risk pregnancy, unspecified, unspecified trimester: Secondary | ICD-10-CM

## 2023-04-09 DIAGNOSIS — R69 Illness, unspecified: Secondary | ICD-10-CM

## 2023-04-09 NOTE — BH Specialist Note (Signed)
 Integrated Behavioral Health via Telemedicine Visit  04/11/2023 Cheryl Cohen [id number]  Number of Integrated Behavioral Health Clinician visits: 2- Second Visit  Session Start time: 1135    Session End time: 1142   Total time in minutes: 7   Referring Provider: Nidia Daring Patient/Family location: At work [facility] Provider location: Research Scientist (medical) All persons participating in visit: Patient and [facility] Types of Service: Individual psychotherapy and Video visit  I connected with Cheryl Cohen and/or Cheryl Cohen's patient via  Telephone or Video Enabled Telemedicine Application  (Video is Caregility application) and verified that I am speaking with the correct person using two identifiers. Discussed confidentiality: Yes   I discussed the limitations of telemedicine and the availability of in person appointments.  Discussed there is a possibility of technology failure and discussed alternative modes of communication if that failure occurs.  I discussed that engaging in this telemedicine visit, they consent to the provision of behavioral healthcare and the services will be billed under their insurance.  Patient and/or legal guardian expressed understanding and consented to Telemedicine visit: Yes   Presenting Concerns: Patient and/or family reports the following symptoms/concerns: decreased  symptoms.  Duration of problem: Months; Severity of problem: moderate  Patient and/or Family's Strengths/Protective Factors: Concrete supports in place (healthy food, safe environments, etc.), Physical Health (exercise, healthy diet, medication compliance, etc.), and Parental Resilience  Goals Addressed: Patient will:  Reduce symptoms of: anxiety   Increase knowledge and/or ability of: coping skills and healthy habits   Demonstrate ability to: Increase healthy adjustment to current life circumstances  Progress towards Goals: Ongoing  Interventions: Interventions utilized:   Supportive Counseling, Psychoeducation and/or Health Education, and Supportive Reflection Standardized Assessments completed: Not Needed  Patient and/or Family Response: The patient attended today's virtual session while at work and opted for a brief check-in. She reported continued compliance with her prescribed medications and shared that her pregnancy is progressing well. The patient noted that she and her family moved into a 2-bedroom home in December and are transitioning smoothly, enjoying the new space while preparing for the arrival of their baby this year.  The patient expressed a need for additional furniture and employment resources. She was informed that these resources would be provided via mychart.    Assessment: The patient appears to be managing her current responsibilities effectively while expressing appropriate concerns and needs for additional resources. She remains engaged in her treatment plan and demonstrates resilience during this transitional period.  Patient may benefit from support of integrated behavioral health as needed.  Plan: Follow up with behavioral health clinician on : No follow up scheduled at this time.  Behavioral recommendations: Schedule follow up appointment if symptoms worsen. Continue coping strategies and  medication compliance. Utilize provided resources sent in [place].  Referral(s): Integrated [employer] (In Clinic)  I discussed the assessment and treatment plan with the patient and/or parent/guardian. They were provided an opportunity to ask questions and all were answered. They agreed with the plan and demonstrated an understanding of the instructions.   They were advised to call back or seek an in-person evaluation if the symptoms worsen or if the condition fails to improve as anticipated.  Cheryl Cohen, LCSWA

## 2023-04-09 NOTE — Progress Notes (Signed)
 SUBJECTIVE:  33 y.o. female complains of grey and malodorous vaginal discharge for 5 day(s). Denies abnormal vaginal bleeding or significant pelvic pain or fever. No UTI symptoms. Denies history of known exposure to STD.  Patient's last menstrual period was 09/17/2022 (exact date).  OBJECTIVE:  She appears well, afebrile.   ASSESSMENT:  Vaginal Discharge  Vaginal Odor   PLAN:  GC, chlamydia, trichomonas, BVAG, CVAG probe sent to lab. Treatment: To be determined once lab results are received ROV prn if symptoms persist or worsen.

## 2023-04-10 LAB — CERVICOVAGINAL ANCILLARY ONLY
Bacterial Vaginitis (gardnerella): POSITIVE — AB
Candida Glabrata: NEGATIVE
Candida Vaginitis: POSITIVE — AB
Chlamydia: NEGATIVE
Comment: NEGATIVE
Comment: NEGATIVE
Comment: NEGATIVE
Comment: NEGATIVE
Comment: NEGATIVE
Comment: NORMAL
Neisseria Gonorrhea: NEGATIVE
Trichomonas: NEGATIVE

## 2023-04-10 MED ORDER — METRONIDAZOLE 500 MG PO TABS: 500.0000 mg | ORAL_TABLET | Freq: Two times a day (BID) | ORAL | 0 refills | Status: DC

## 2023-04-10 MED ORDER — TERCONAZOLE 0.8 % VA CREA: 1.0000 | TOPICAL_CREAM | Freq: Every day | VAGINAL | 0 refills | Status: DC

## 2023-04-10 NOTE — Addendum Note (Signed)
 Addended by: Catalina Antigua on: 04/10/2023 06:36 PM   Modules accepted: Orders

## 2023-04-23 ENCOUNTER — Ambulatory Visit (INDEPENDENT_AMBULATORY_CARE_PROVIDER_SITE_OTHER): Payer: 59 | Admitting: Advanced Practice Midwife

## 2023-04-23 ENCOUNTER — Encounter: Payer: Self-pay | Admitting: Advanced Practice Midwife

## 2023-04-23 VITALS — BP 106/73 | HR 95 | Wt 179.0 lb

## 2023-04-23 DIAGNOSIS — O0990 Supervision of high risk pregnancy, unspecified, unspecified trimester: Secondary | ICD-10-CM

## 2023-04-23 DIAGNOSIS — M5431 Sciatica, right side: Secondary | ICD-10-CM

## 2023-04-23 DIAGNOSIS — F411 Generalized anxiety disorder: Secondary | ICD-10-CM

## 2023-04-23 DIAGNOSIS — Z8759 Personal history of other complications of pregnancy, childbirth and the puerperium: Secondary | ICD-10-CM

## 2023-04-23 DIAGNOSIS — F1220 Cannabis dependence, uncomplicated: Secondary | ICD-10-CM

## 2023-04-23 DIAGNOSIS — Z8632 Personal history of gestational diabetes: Secondary | ICD-10-CM

## 2023-04-23 DIAGNOSIS — F3164 Bipolar disorder, current episode mixed, severe, with psychotic features: Secondary | ICD-10-CM

## 2023-04-23 DIAGNOSIS — Z3A32 32 weeks gestation of pregnancy: Secondary | ICD-10-CM | POA: Insufficient documentation

## 2023-04-23 DIAGNOSIS — R87612 Low grade squamous intraepithelial lesion on cytologic smear of cervix (LGSIL): Secondary | ICD-10-CM

## 2023-04-23 NOTE — Progress Notes (Signed)
Pt presents for ROB visit. Pt c/o pelvic pressure and sciatica.

## 2023-04-23 NOTE — Assessment & Plan Note (Signed)
01/30/14 LGSIL - No colpo on record 05/24/22 NILM  Consider PP pap with co testing

## 2023-04-23 NOTE — Progress Notes (Signed)
   PRENATAL VISIT NOTE  Subjective:  Cheryl Cohen is a 33 y.o. [phone] at [redacted]w[redacted]d being seen today for ongoing prenatal care.  She is currently monitored for the following issues for this high-risk pregnancy and has Abnormal cervical Papanicolaou smear; Bipolar affective disorder, mixed, severe, with psychotic behavior (HCC); Delta-9-tetrahydrocannabinol (THC) dependence (HCC); Generalized anxiety disorder; History of IUFD; Supervision of high risk pregnancy, antepartum; History of gestational diabetes mellitus (GDM); and [redacted] weeks gestation of pregnancy on their problem list.  Patient reports  right sided sciatica like pain in buttocks radiating down leg .  Contractions: Not present. Vag. Bleeding: None.  Movement: Present. Denies leaking of fluid.   The following portions of the patient's history were reviewed and updated as appropriate: allergies, current medications, past family history, past medical history, past social history, past surgical history and problem list.   Objective:   Vitals:   04/23/23 1431  BP: 106/73  Pulse: 95  Weight: 179 lb (81.2 kg)    Fetal Status: Fetal Heart Rate (bpm): 151   Movement: Present     General:  Alert, oriented and cooperative. Patient is in no acute distress.  Skin: Skin is warm and dry. No rash noted.   Cardiovascular: Normal heart rate noted  Respiratory: Normal respiratory effort, no problems with respiration noted  Abdomen: Soft, gravid, appropriate for gestational age.  Pain/Pressure: Present     Pelvic: Cervical exam deferred        Extremities: Normal range of motion.  Edema: None  Mental Status: Normal mood and affect. Normal behavior. Normal judgment and thought content.   Assessment and Plan:  Pregnancy: [id number] at [redacted]w[redacted]d  1. [redacted] weeks gestation of pregnancy (Primary)   2. Bipolar affective disorder, mixed, severe, with psychotic behavior (HCC) - stable on meds - Consider Early PP mood check  3. Delta-9-tetrahydrocannabinol  (THC) dependence (HCC)   4. Generalized anxiety disorder - Stable on meds   6. Supervision of high risk pregnancy, antepartum   7. History of gestational diabetes mellitus (GDM) GTT NM (04/30/23)  8. Sciatica of right side PT referral sent  9. 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  History of IUFD 8/24  mono/di twins at 25wks, Induced  ARIPiprazole ARIPiprazole ER Srer benzocaine hydrOXYzine lithium carbonate metroNIDAZOLE PRENATAL VITAMIN PO propranolol terconazole traZODone   Preterm labor symptoms and general obstetric precautions including but not limited to vaginal bleeding, contractions, leaking of fluid and fetal movement were reviewed in detail with the patient. Please refer to After Visit Summary for other counseling recommendations.   [facility] (around [facility]  Future Appointments  Date Time Provider Department Center  04/30/2023  3:30 PM [facility] US3 [facility]  05/07/2023  1:30 PM Allie Bossier, MD [facility] None

## 2023-04-25 ENCOUNTER — Ambulatory Visit: Payer: 59 | Admitting: Physical Therapy

## 2023-04-30 ENCOUNTER — Ambulatory Visit: Payer: 59 | Attending: Obstetrics and Gynecology

## 2023-04-30 DIAGNOSIS — O09299 Supervision of pregnancy with other poor reproductive or obstetric history, unspecified trimester: Secondary | ICD-10-CM | POA: Insufficient documentation

## 2023-04-30 DIAGNOSIS — Z3A33 33 weeks gestation of pregnancy: Secondary | ICD-10-CM

## 2023-04-30 DIAGNOSIS — F319 Bipolar disorder, unspecified: Secondary | ICD-10-CM

## 2023-04-30 DIAGNOSIS — Z8632 Personal history of gestational diabetes: Secondary | ICD-10-CM | POA: Diagnosis present

## 2023-04-30 DIAGNOSIS — O99343 Other mental disorders complicating pregnancy, third trimester: Secondary | ICD-10-CM

## 2023-04-30 DIAGNOSIS — O09293 Supervision of pregnancy with other poor reproductive or obstetric history, third trimester: Secondary | ICD-10-CM

## 2023-04-30 DIAGNOSIS — O3663X0 Maternal care for excessive fetal growth, third trimester, not applicable or unspecified: Secondary | ICD-10-CM

## 2023-04-30 DIAGNOSIS — Z8759 Personal history of other complications of pregnancy, childbirth and the puerperium: Secondary | ICD-10-CM | POA: Insufficient documentation

## 2023-04-30 NOTE — Therapy (Signed)
OUTPATIENT PHYSICAL THERAPY THORACOLUMBAR EVALUATION   Patient Name: Cheryl Cohen MRN: [id number] DOB:1990-04-23, 33 y.o., female Today's Date: 05/01/2023  END OF SESSION:  PT End of Session - 05/01/23 1221     Visit Number 1    Date for PT Re-Evaluation 06/26/23    Authorization Type [employer] (visits requested)    PT Start Time 1151    PT Stop Time 1224    PT Time Calculation (min) 33 min    Activity Tolerance Patient tolerated treatment well    Behavior During Therapy WFL for tasks assessed/performed             Past Medical History:  Diagnosis Date   Anxiety    Bipolar 1 disorder (HCC)    hospitalized  3 x onset rx in 9th grade    Family history of diabetes mellitus type II 01/11/2012   Gestational diabetes    Gonorrhea    Hx of varicella    Psychotic disorder with delusions (HCC)    Suicidal ideation 10/06/2013   Syphilis    Twin pregnancy, delivered vaginally, IUFD stillborns 09/09/2015   IUFD  22 + weeks  still borns    Past Surgical History:  Procedure Laterality Date   DILATION AND EVACUATION N/A 09/09/2015   Procedure: DILATATION AND EVACUATION;  Surgeon: [name], MD;  Location: [facility];  Service: Gynecology;  Laterality: N/A;   TYMPANOSTOMY TUBE PLACEMENT     asage 2-3 yrs   Patient Active Problem List   Diagnosis Date Noted   [redacted] weeks gestation of pregnancy 04/23/2023   History of gestational diabetes mellitus (GDM) 12/07/2022   History of IUFD 11/21/2022   Supervision of high risk pregnancy, antepartum 11/21/2022   Delta-9-tetrahydrocannabinol (THC) dependence (HCC) 07/19/2022   Generalized anxiety disorder 07/19/2022   Bipolar affective disorder, mixed, severe, with psychotic behavior (HCC) 07/18/2022   Abnormal cervical Papanicolaou smear 10/14/2021    PCP: Leland Johns, FNP  REFERRING PROVIDER: Colman Cater, NP  REFERRING DIAG: M54.31 (ICD-10-CM) - Sciatica of right side  Rationale for Evaluation and Treatment:  Rehabilitation  THERAPY DIAG:  Pain in right leg - Plan: PT plan of care cert/re-cert  Other low back pain - Plan: PT plan of care cert/re-cert  Cramp and spasm - Plan: PT plan of care cert/re-cert  ONSET DATE: 04/17/23  SUBJECTIVE:                                                                                                                                                                                           SUBJECTIVE STATEMENT: Pt is [redacted] weeks pregnant with due date of 06/15/23.  Pt reports Rt gluteal pain that has resolved somewhat over the past week.  Pt reports 8/10 up until 3-4 days ago This is pt's 2nd pregnancy and her child is 69 years old.   From MD note on 04/23/23:  Patient reports  right sided sciatica like pain in buttocks radiating down leg.    PERTINENT HISTORY:  Patient is pregnant ; anxiety; Bipolar disorder  PAIN: 05/01/23: Are you having pain? Yes: NPRS scale: 0-8/10 Pain location: Rt gluteals to lateral thigh Pain description: shooting aching  Aggravating factors: sleep on Rt side, walking, sitting long periods  Relieving factors: change of position   PRECAUTIONS: Other: Pt is pregnant  due 06/26/23  RED FLAGS: None   WEIGHT BEARING RESTRICTIONS: No  FALLS:  Has patient fallen in last 6 months? No  LIVING ENVIRONMENT: Lives with: lives with their family Lives in: House/apartment Stairs: Yes: External: to apartment  Has following equipment at home: None  OCCUPATION: no work   PLOF: Independent and Leisure: cares for her 67 year old child   PATIENT GOALS: reduce leg pain, sleep on Rt side without waking   NEXT MD VISIT: 05/07/23  OBJECTIVE:  Note: Objective measures were completed at Evaluation unless otherwise noted.   PATIENT SURVEYS:  Modified Oswestry 17/50=34% disability    COGNITION: Overall cognitive status: Within functional limits for tasks assessed     SENSATION: WFL  MUSCLE LENGTH: WFLs   POSTURE: increased lumbar lordosis  and flexed trunk   PALPATION: No palpable tenderness today  LUMBAR ROM:  WFLs with Lt and Rt flank pain with sidebending  LOWER EXTREMITY ROM:    WFLs without pain  LOWER EXTREMITY MMT:   4+/5 bil hip, 5/5 bil knee   GAIT: Gait is WNL  TREATMENT DATE:  04/30/25:  HEP established- see below   If treatment provided at initial evaluation, no treatment charged due to lack of authorization.                                                                                                                                    PATIENT EDUCATION:  Education details: Access Code: [phone] Person educated: Patient Education method: Explanation, Demonstration, and Handouts Education comprehension: verbalized understanding and returned demonstration  HOME EXERCISE PROGRAM: Access Code: [email or web address] URL: [email or web address] Date: 05/01/2023 Prepared by: Tresa Endo  Exercises - Cat-Camel  - 2-3 x daily - 7 x weekly - 1 sets - 10 reps - 5 hold - Child's Pose Stretch  - 2-3 x daily - 7 x weekly - 1 sets - 3 reps - 20 hold - Child's Pose with Sidebending  - 2-3 x daily - 7 x weekly - 1 sets - 3 reps - 20 hold - Clamshell  - 2 x daily - 7 x weekly - 1 sets - 10 reps - Seated Hamstring Stretch  - 3 x daily - 7 x weekly - 1 sets - 3 reps -  20 hold - Seated Piriformis Stretch with Trunk Bend  - 3 x daily - 7 x weekly - 1 sets - 3 reps - 20 hold  ASSESSMENT:  CLINICAL IMPRESSION: Patient is a 33 y.o. female who was seen today for physical therapy evaluation and treatment for Rt sided sciatica. Pt reports that pain began ~2 weeks ago and has reduced significantly in the past week.  ODI is 34% disability today and pt is most limited with sleep at night due to Lt gluteal pain.  Pain ranges from 0-8/10 in the Rt gluteals with sleep and standing.  Pt with all mobility WFLs and reports pain with lumbar side bending.  Pt will benefit from education during pregnancy to improve stability, flexiblity  and mechanics to reduce pain as her baby grows.  Patient will benefit from skilled PT to address the below impairments and improve overall function.   OBJECTIVE IMPAIRMENTS: decreased activity tolerance, impaired perceived functional ability, postural dysfunction, and pain.   ACTIVITY LIMITATIONS: standing and sleeping  PARTICIPATION LIMITATIONS: meal prep and community activity  PERSONAL FACTORS: 1 comorbidity: pt is pregnant   are also affecting patient's functional outcome.   REHAB POTENTIAL: Good  CLINICAL DECISION MAKING: Stable/uncomplicated  EVALUATION COMPLEXITY: Low   GOALS: Goals reviewed with patient? Yes  SHORT TERM GOALS: Target date: 05/29/2023    Be independent in initial HEP Baseline: Goal status: INITIAL  2.  Improve ODI to < or = to 20% disability  Baseline: 34% Goal status: INITIAL  3.  Report 50% improved quality and quantity of sleep due to reduced Rt LE pain  Baseline: 0-8/10 and waking nightly Goal status: INITIAL    LONG TERM GOALS: Target date: 06/26/2023    Be independent in advanced HEP Baseline:  Goal status: INITIAL  2.  Improve ODI to < or = to 10% disability  Baseline: 34% disability  Goal status: INITIAL  3.  Verbalize and demonstrate body mechanics modifications to lumbar protection with daily tasks  Baseline:  Goal status: INITIAL  4.  Sleep without waking with Rt LE pain > or = to 4 nights/wk Baseline:  Goal status: INITIAL   PLAN:  PT FREQUENCY: 2x/week  PT DURATION: 8 weeks  PLANNED INTERVENTIONS: 97110-Therapeutic exercises, 97530- Therapeutic activity, 97112- Neuromuscular re-education, 97535- Self Care, 98119- Manual therapy, [phone]- Canalith repositioning, [id number]- Aquatic Therapy, Patient/Family education, Balance training, Taping, Dry Needling, Joint mobilization, Spinal mobilization, Vestibular training, Cryotherapy, and Moist heat.  PLAN FOR NEXT SESSION: body mechanics education, flexibility, strength     Lorrene Reid, PT 05/01/23 1:33 PM   [facility] [address], [address] [place], [place] [address] Phone # [phone] Fax [phone]

## 2023-05-01 ENCOUNTER — Other Ambulatory Visit: Payer: Self-pay

## 2023-05-01 ENCOUNTER — Ambulatory Visit: Payer: 59 | Attending: Nurse Practitioner

## 2023-05-01 ENCOUNTER — Other Ambulatory Visit: Payer: Self-pay | Admitting: *Deleted

## 2023-05-01 DIAGNOSIS — M5431 Sciatica, right side: Secondary | ICD-10-CM | POA: Insufficient documentation

## 2023-05-01 DIAGNOSIS — Z3689 Encounter for other specified antenatal screening: Secondary | ICD-10-CM

## 2023-05-01 DIAGNOSIS — M79604 Pain in right leg: Secondary | ICD-10-CM | POA: Insufficient documentation

## 2023-05-01 DIAGNOSIS — M5459 Other low back pain: Secondary | ICD-10-CM | POA: Insufficient documentation

## 2023-05-01 DIAGNOSIS — R252 Cramp and spasm: Secondary | ICD-10-CM | POA: Insufficient documentation

## 2023-05-07 ENCOUNTER — Ambulatory Visit (INDEPENDENT_AMBULATORY_CARE_PROVIDER_SITE_OTHER): Payer: 59 | Admitting: Obstetrics & Gynecology

## 2023-05-07 VITALS — BP 101/59 | HR 100 | Wt 179.0 lb

## 2023-05-07 DIAGNOSIS — Z3A34 34 weeks gestation of pregnancy: Secondary | ICD-10-CM

## 2023-05-07 DIAGNOSIS — O0990 Supervision of high risk pregnancy, unspecified, unspecified trimester: Secondary | ICD-10-CM

## 2023-05-07 DIAGNOSIS — O99013 Anemia complicating pregnancy, third trimester: Secondary | ICD-10-CM | POA: Insufficient documentation

## 2023-05-07 DIAGNOSIS — Z8632 Personal history of gestational diabetes: Secondary | ICD-10-CM

## 2023-05-07 DIAGNOSIS — Z8759 Personal history of other complications of pregnancy, childbirth and the puerperium: Secondary | ICD-10-CM

## 2023-05-07 DIAGNOSIS — F3164 Bipolar disorder, current episode mixed, severe, with psychotic features: Secondary | ICD-10-CM

## 2023-05-07 DIAGNOSIS — O3663X0 Maternal care for excessive fetal growth, third trimester, not applicable or unspecified: Secondary | ICD-10-CM | POA: Insufficient documentation

## 2023-05-07 NOTE — Progress Notes (Signed)
 No c/o today

## 2023-05-07 NOTE — Progress Notes (Signed)
   PRENATAL VISIT NOTE  Subjective:  Cheryl Cohen is a 33 y.o. [id number] at [redacted]w[redacted]d being seen today for ongoing prenatal care.  She is currently monitored for the following issues for this high-risk pregnancy and has Bipolar affective disorder, mixed, severe, with psychotic behavior (HCC); Delta-9-tetrahydrocannabinol (THC) dependence (HCC); Generalized anxiety disorder; History of IUFD; Supervision of high risk pregnancy, antepartum; History of gestational diabetes mellitus (GDM); Excessive fetal growth affecting management of mother in third trimester, antepartum; and Anemia during pregnancy in third trimester on their problem list.  Patient reports no complaints.  Contractions: Not present. Vag. Bleeding: None.  Movement: Present. Denies leaking of fluid.   The following portions of the patient's history were reviewed and updated as appropriate: allergies, current medications, past family history, past medical history, past social history, past surgical history and problem list.   Objective:   Vitals:   05/07/23 1340  BP: (!) 101/59  Pulse: 100  Weight: 179 lb (81.2 kg)    Fetal Status:     Movement: Present     FH- 36cm FHR- 120s, visible large fetal movements during measuring  General:  Alert, oriented and cooperative. Patient is in no acute distress.  Skin: Skin is warm and dry. No rash noted.   Cardiovascular: Normal heart rate noted  Respiratory: Normal respiratory effort, no problems with respiration noted  Abdomen: Soft, gravid, appropriate for gestational age.  Pain/Pressure: Absent     Pelvic: Cervical exam deferred        Extremities: Normal range of motion.  Edema: None  Mental Status: Normal mood and affect. Normal behavior. Normal judgment and thought content.   Assessment and Plan:  Pregnancy: [id number] at [redacted]w[redacted]d 1. Bipolar affective disorder, mixed, severe, with psychotic behavior (HCC) (Primary) - well-controlled at present - rec 2 week PP visit  2. History  of IUFD (Premature twins)  3. Supervision of high risk pregnancy, antepartum   4. History of gestational diabetes mellitus (GDM) - normal glucola  5. [redacted] weeks gestation of pregnancy  6. Excessive fetal growth affecting management of pregnancy in third trimester, single or unspecified fetus - 97% growth on MFM scan, has follow up in about 2 weeks  7. Anemia during pregnancy in third trimester - We discussed eating an iron-rich diet and considering an OTC iron supplement  Preterm labor symptoms and general obstetric precautions including but not limited to vaginal bleeding, contractions, leaking of fluid and fetal movement were reviewed in detail with the patient. Please refer to After Visit Summary for other counseling recommendations.   Return in about 1 week (around 05/14/2023).  Future Appointments  Date Time Provider Department Center  05/09/2023 11:45 AM Edrick Oh, PT [facility] None  05/14/2023  2:30 PM Leftwich-Kirby, Wilmer Floor, CNM [facility] None  05/15/2023 12:30 PM Claude Manges, PT [facility] None  05/21/2023 12:30 PM Edrick Oh, PT [facility] None  05/21/2023  2:30 PM Allie Bossier, MD [facility] None  05/28/2023  1:30 PM Allie Bossier, MD [facility] None  05/28/2023  3:30 PM [facility]3 [facility]  05/29/2023  2:45 PM Claude Manges, PT [facility] None  06/04/2023  1:50 PM Lennart Pall, MD [facility] None  06/11/2023  1:50 PM Lennart Pall, MD [facility] None  06/18/2023  1:30 PM Lennart Pall, MD [facility] None    Allie Bossier, MD

## 2023-05-14 ENCOUNTER — Other Ambulatory Visit (HOSPITAL_COMMUNITY)
Admission: RE | Admit: 2023-05-14 | Discharge: 2023-05-14 | Disposition: A | Discharge status: Home / Self Care | Payer: MEDICAID | Source: Ambulatory Visit | Attending: Advanced Practice Midwife | Admitting: Advanced Practice Midwife

## 2023-05-14 ENCOUNTER — Ambulatory Visit (INDEPENDENT_AMBULATORY_CARE_PROVIDER_SITE_OTHER): Payer: MEDICAID | Admitting: Advanced Practice Midwife

## 2023-05-14 ENCOUNTER — Encounter: Payer: Self-pay | Admitting: Advanced Practice Midwife

## 2023-05-14 VITALS — BP 96/63 | HR 77 | Wt 180.4 lb

## 2023-05-14 DIAGNOSIS — Z3A35 35 weeks gestation of pregnancy: Secondary | ICD-10-CM | POA: Diagnosis not present

## 2023-05-14 DIAGNOSIS — Z8759 Personal history of other complications of pregnancy, childbirth and the puerperium: Secondary | ICD-10-CM

## 2023-05-14 DIAGNOSIS — N898 Other specified noninflammatory disorders of vagina: Secondary | ICD-10-CM | POA: Diagnosis not present

## 2023-05-14 DIAGNOSIS — O0990 Supervision of high risk pregnancy, unspecified, unspecified trimester: Secondary | ICD-10-CM

## 2023-05-14 MED ORDER — FLUCONAZOLE 150 MG PO TABS
ORAL_TABLET | ORAL | 0 refills | Status: DC
Start: 2023-05-14 — End: 2023-05-21

## 2023-05-14 NOTE — Progress Notes (Signed)
 Pt presents for ROB visit. Pt c/o yellow vaginal discharge and irritation.

## 2023-05-14 NOTE — Progress Notes (Signed)
   PRENATAL VISIT NOTE  Subjective:  Lakima Kinsey Scull is a 33 y.o. [phone] at [redacted]w[redacted]d being seen today for ongoing prenatal care.  She is currently monitored for the following issues for this high-risk pregnancy and has Bipolar affective disorder, mixed, severe, with psychotic behavior (HCC); Delta-9-tetrahydrocannabinol (THC) dependence (HCC); Generalized anxiety disorder; History of IUFD; Supervision of high risk pregnancy, antepartum; History of gestational diabetes mellitus (GDM); Excessive fetal growth affecting management of mother in third trimester, antepartum; and Anemia during pregnancy in third trimester on their problem list.  Patient reports no complaints.  Contractions: Not present. Vag. Bleeding: None.  Movement: Present. Denies leaking of fluid.   The following portions of the patient's history were reviewed and updated as appropriate: allergies, current medications, past family history, past medical history, past social history, past surgical history and problem list.   Objective:   Vitals:   05/14/23 1447  BP: 96/63  Pulse: 77  Weight: 180 lb 6.4 oz (81.8 kg)    Fetal Status: Fetal Heart Rate (bpm): 148 Fundal Height: 35 cm Movement: Present     General:  Alert, oriented and cooperative. Patient is in no acute distress.  Skin: Skin is warm and dry. No rash noted.   Cardiovascular: Normal heart rate noted  Respiratory: Normal respiratory effort, no problems with respiration noted  Abdomen: Soft, gravid, appropriate for gestational age.  Pain/Pressure: Present     Pelvic: Cervical exam deferred        Extremities: Normal range of motion.  Edema: None  Mental Status: Normal mood and affect. Normal behavior. Normal judgment and thought content.   Assessment and Plan:  Pregnancy: [id number] at [redacted]w[redacted]d 1. Supervision of high risk pregnancy, antepartum --Anticipatory guidance about next visits/weeks of pregnancy given.   2. History of IUFD (Primary) --Demise of twins at 25  weeks --Orders placed for antenatal testing - US  MFM FETAL BPP WO NON STRESS; Future  3. Vaginal irritation --Symptoms c/w yeast, will initiate treatment with swab pending  - Cervicovaginal ancillary only( [place]) - fluconazole  (DIFLUCAN ) 150 MG tablet; Take one tablet now and one tablet in three days  Dispense: 2 tablet; Refill: 0  4. [redacted] weeks gestation of pregnancy    Preterm labor symptoms and general obstetric precautions including but not limited to vaginal bleeding, contractions, leaking of fluid and fetal movement were reviewed in detail with the patient. Please refer to After Visit Summary for other counseling recommendations.   No follow-ups on file.  Future Appointments  Date Time Provider Department Center  05/21/2023 11:15 AM [facility] NURSE [facility]  05/21/2023 11:30 AM [facility] US6 [facility]  05/21/2023  2:30 PM Ana Balling, MD [facility] None  05/28/2023  1:30 PM Ana Balling, MD [facility] None  05/28/2023  3:30 PM [facility] US3 [facility]  05/29/2023  2:45 PM Penelope Bowie, PT [facility] None  06/04/2023  1:50 PM Izell Marsh, MD [facility] None  06/11/2023  1:50 PM Izell Marsh, MD [facility] None  06/18/2023  1:30 PM Izell Marsh, MD [facility] None    Arlester Bence, CNM

## 2023-05-15 ENCOUNTER — Encounter: Payer: 59 | Admitting: Physical Therapy

## 2023-05-16 LAB — CERVICOVAGINAL ANCILLARY ONLY
Bacterial Vaginitis (gardnerella): POSITIVE — AB
Candida Glabrata: NEGATIVE
Candida Vaginitis: POSITIVE — AB
Chlamydia: NEGATIVE
Comment: NEGATIVE
Comment: NEGATIVE
Comment: NEGATIVE
Comment: NEGATIVE
Comment: NEGATIVE
Comment: NORMAL
Neisseria Gonorrhea: NEGATIVE
Trichomonas: NEGATIVE

## 2023-05-19 ENCOUNTER — Encounter: Payer: Self-pay | Admitting: Advanced Practice Midwife

## 2023-05-21 ENCOUNTER — Ambulatory Visit: Payer: MEDICAID | Admitting: Obstetrics & Gynecology

## 2023-05-21 ENCOUNTER — Other Ambulatory Visit (HOSPITAL_COMMUNITY)
Admission: RE | Admit: 2023-05-21 | Discharge: 2023-05-21 | Disposition: A | Discharge status: Home / Self Care | Payer: MEDICAID | Source: Ambulatory Visit | Attending: Obstetrics & Gynecology | Admitting: Obstetrics & Gynecology

## 2023-05-21 ENCOUNTER — Ambulatory Visit: Payer: MEDICAID | Admitting: *Deleted

## 2023-05-21 ENCOUNTER — Ambulatory Visit: Payer: MEDICAID | Attending: Advanced Practice Midwife

## 2023-05-21 ENCOUNTER — Other Ambulatory Visit: Payer: Self-pay | Admitting: Advanced Practice Midwife

## 2023-05-21 VITALS — BP 104/62 | HR 99 | Wt 184.3 lb

## 2023-05-21 VITALS — BP 103/61 | HR 86

## 2023-05-21 DIAGNOSIS — O0990 Supervision of high risk pregnancy, unspecified, unspecified trimester: Secondary | ICD-10-CM

## 2023-05-21 DIAGNOSIS — O368390 Maternal care for abnormalities of the fetal heart rate or rhythm, unspecified trimester, not applicable or unspecified: Secondary | ICD-10-CM

## 2023-05-21 DIAGNOSIS — O99013 Anemia complicating pregnancy, third trimester: Secondary | ICD-10-CM

## 2023-05-21 DIAGNOSIS — Z8632 Personal history of gestational diabetes: Secondary | ICD-10-CM | POA: Diagnosis not present

## 2023-05-21 DIAGNOSIS — O3663X0 Maternal care for excessive fetal growth, third trimester, not applicable or unspecified: Secondary | ICD-10-CM | POA: Diagnosis not present

## 2023-05-21 DIAGNOSIS — Z8759 Personal history of other complications of pregnancy, childbirth and the puerperium: Secondary | ICD-10-CM

## 2023-05-21 DIAGNOSIS — Z3A35 35 weeks gestation of pregnancy: Secondary | ICD-10-CM

## 2023-05-21 DIAGNOSIS — F3164 Bipolar disorder, current episode mixed, severe, with psychotic features: Secondary | ICD-10-CM

## 2023-05-21 DIAGNOSIS — N898 Other specified noninflammatory disorders of vagina: Secondary | ICD-10-CM

## 2023-05-21 DIAGNOSIS — Z3A36 36 weeks gestation of pregnancy: Secondary | ICD-10-CM

## 2023-05-21 NOTE — Progress Notes (Signed)
   PRENATAL VISIT NOTE  Subjective:  Cheryl Cohen is a 33 y.o. [phone] at [redacted]w[redacted]d being seen today for ongoing prenatal care.  She is currently monitored for the following issues for this high-risk pregnancy and has Bipolar affective disorder, mixed, severe, with psychotic behavior (HCC); Delta-9-tetrahydrocannabinol (THC) dependence (HCC); Generalized anxiety disorder; History of IUFD; Supervision of high risk pregnancy, antepartum; History of gestational diabetes mellitus (GDM); Excessive fetal growth affecting management of mother in third trimester, antepartum; and Anemia during pregnancy in third trimester on their problem list.  Patient reports no complaints.  Contractions: Not present. Vag. Bleeding: None.  Movement: Present. Denies leaking of fluid.   The following portions of the patient's history were reviewed and updated as appropriate: allergies, current medications, past family history, past medical history, past social history, past surgical history and problem list.   Objective:   Vitals:   05/21/23 1444  BP: 104/62  Pulse: 99  Weight: 184 lb 4.8 oz (83.6 kg)    Fetal Status:     Movement: Present     General:  Alert, oriented and cooperative. Patient is in no acute distress.  Skin: Skin is warm and dry. No rash noted.   Cardiovascular: Normal heart rate noted  Respiratory: Normal respiratory effort, no problems with respiration noted  Abdomen: Soft, gravid, appropriate for gestational age.  Pain/Pressure: Present     Pelvic: Cervical exam performed in the presence of a chaperone       fingertip/50/-3/soft/mid  Extremities: Normal range of motion.  Edema: None  Mental Status: Normal mood and affect. Normal behavior. Normal judgment and thought content.   Assessment and Plan:  Pregnancy: [id number] at [redacted]w[redacted]d 1. Anemia during pregnancy in third trimester (Primary) - on PNVs with iron  2. Excessive fetal growth affecting management of pregnancy in third trimester,  single or unspecified fetus - 97% on MFM scan 04/30/2023 - weekly MFM BPPs  3. History of gestational diabetes mellitus (GDM) - normal 2 hour GTT  4. Supervision of high risk pregnancy, antepartum   5. History of IUFD - due to twin-twin transfusion in the past  6. Bipolar affective disorder, mixed, severe, with psychotic behavior (HCC) - stable on Abilify  7. [redacted] weeks gestation of pregnancy  - Culture, beta strep (group b only) - Cervicovaginal ancillary only( [place])  Preterm labor symptoms and general obstetric precautions including but not limited to vaginal bleeding, contractions, leaking of fluid and fetal movement were reviewed in detail with the patient. Please refer to After Visit Summary for other counseling recommendations.   Return in about 1 week (around 05/28/2023).  Future Appointments  Date Time Provider Department Center  05/28/2023  1:30 PM Allie Bossier, MD [facility] None  05/28/2023  3:30 PM [facility] US3 [facility]  05/29/2023  2:45 PM Claude Manges, PT [facility] None  06/04/2023  1:50 PM Lennart Pall, MD [facility] None  06/11/2023  1:50 PM Lennart Pall, MD [facility] None  06/18/2023  1:30 PM Lennart Pall, MD [facility] None    Allie Bossier, MD

## 2023-05-21 NOTE — Procedures (Signed)
Cheryl Cohen May 05, 1990 [redacted]w[redacted]d  Fetus A Non-Stress Test Interpretation for 05/21/23  Indication:  FHR decel  Fetal Heart Rate A Mode: External Baseline Rate (A): 130 bpm Variability: Moderate Accelerations: 15 x 15 Decelerations: None Multiple birth?: No  Uterine Activity Mode: Palpation, Toco Contraction Frequency (min): 2 ucs Contraction Duration (sec): 60-100 Contraction Quality: Mild Resting Tone Palpated: Relaxed Resting Time: Adequate  Interpretation (Fetal Testing) Nonstress Test Interpretation: Reactive Overall Impression: Reassuring for gestational age Comments: Dr. Judeth Cornfield rreviewed tracing

## 2023-05-22 LAB — CERVICOVAGINAL ANCILLARY ONLY
Chlamydia: NEGATIVE
Comment: NEGATIVE
Comment: NORMAL
Neisseria Gonorrhea: NEGATIVE

## 2023-05-24 LAB — CULTURE, BETA STREP (GROUP B ONLY): Strep Gp B Culture: POSITIVE — AB

## 2023-05-28 ENCOUNTER — Encounter: Payer: Self-pay | Admitting: Obstetrics & Gynecology

## 2023-05-28 ENCOUNTER — Ambulatory Visit (INDEPENDENT_AMBULATORY_CARE_PROVIDER_SITE_OTHER): Payer: MEDICAID | Admitting: Obstetrics & Gynecology

## 2023-05-28 ENCOUNTER — Other Ambulatory Visit: Payer: Self-pay | Admitting: *Deleted

## 2023-05-28 ENCOUNTER — Other Ambulatory Visit: Payer: Self-pay | Admitting: Obstetrics and Gynecology

## 2023-05-28 ENCOUNTER — Ambulatory Visit: Payer: MEDICAID | Attending: Obstetrics and Gynecology

## 2023-05-28 VITALS — BP 101/68 | HR 103 | Wt 185.0 lb

## 2023-05-28 DIAGNOSIS — O99820 Streptococcus B carrier state complicating pregnancy: Secondary | ICD-10-CM

## 2023-05-28 DIAGNOSIS — Z3689 Encounter for other specified antenatal screening: Secondary | ICD-10-CM

## 2023-05-28 DIAGNOSIS — O3663X0 Maternal care for excessive fetal growth, third trimester, not applicable or unspecified: Secondary | ICD-10-CM | POA: Diagnosis not present

## 2023-05-28 DIAGNOSIS — D649 Anemia, unspecified: Secondary | ICD-10-CM | POA: Diagnosis not present

## 2023-05-28 DIAGNOSIS — Z3A37 37 weeks gestation of pregnancy: Secondary | ICD-10-CM

## 2023-05-28 DIAGNOSIS — O368330 Maternal care for abnormalities of the fetal heart rate or rhythm, third trimester, not applicable or unspecified: Secondary | ICD-10-CM

## 2023-05-28 DIAGNOSIS — O0990 Supervision of high risk pregnancy, unspecified, unspecified trimester: Secondary | ICD-10-CM

## 2023-05-28 DIAGNOSIS — Z8759 Personal history of other complications of pregnancy, childbirth and the puerperium: Secondary | ICD-10-CM

## 2023-05-28 DIAGNOSIS — O99013 Anemia complicating pregnancy, third trimester: Secondary | ICD-10-CM | POA: Diagnosis not present

## 2023-05-28 DIAGNOSIS — O09293 Supervision of pregnancy with other poor reproductive or obstetric history, third trimester: Secondary | ICD-10-CM

## 2023-05-28 DIAGNOSIS — F319 Bipolar disorder, unspecified: Secondary | ICD-10-CM

## 2023-05-28 DIAGNOSIS — O99343 Other mental disorders complicating pregnancy, third trimester: Secondary | ICD-10-CM

## 2023-05-28 NOTE — Progress Notes (Signed)
 ROB 37.[redacted] wks  GA No unusual complaints Requests SVE

## 2023-05-28 NOTE — Progress Notes (Signed)
   PRENATAL VISIT NOTE  Subjective:  Cheryl Cohen is a 33 y.o. [phone] at [redacted]w[redacted]d being seen today for ongoing prenatal care.  She is currently monitored for the following issues for this low-risk pregnancy and has Bipolar affective disorder, mixed, severe, with psychotic behavior (HCC); Delta-9-tetrahydrocannabinol (THC) dependence (HCC); Generalized anxiety disorder; History of IUFD; Supervision of high risk pregnancy, antepartum; History of gestational diabetes mellitus (GDM); Excessive fetal growth affecting management of mother in third trimester, antepartum; Anemia during pregnancy in third trimester; and GBS (group B Streptococcus carrier), +RV culture, currently pregnant on their problem list.  Patient reports no complaints.  Contractions: Not present. Vag. Bleeding: None.  Movement: Present. Denies leaking of fluid.   The following portions of the patient's history were reviewed and updated as appropriate: allergies, current medications, past family history, past medical history, past social history, past surgical history and problem list.   Objective:   Vitals:   05/28/23 1333  BP: 101/68  Pulse: (!) 103  Weight: 185 lb (83.9 kg)    Fetal Status: Fetal Heart Rate (bpm): 140 Fundal Height: 36 cm Movement: Present  Presentation: Vertex  General:  Alert, oriented and cooperative. Patient is in no acute distress.  Skin: Skin is warm and dry. No rash noted.   Cardiovascular: Normal heart rate noted  Respiratory: Normal respiratory effort, no problems with respiration noted  Abdomen: Soft, gravid, appropriate for gestational age.  Pain/Pressure: Present     Pelvic: Cervical exam performed in the presence of a chaperone Dilation: Fingertip Effacement (%): 50 Station: Ballotable  Extremities: Normal range of motion.  Edema: None  Mental Status: Normal mood and affect. Normal behavior. Normal judgment and thought content.   Assessment and Plan:  Pregnancy: [id number] at [redacted]w[redacted]d 1.  History of IUFD (Primary) - weekly MFM BTT  2. Supervision of high risk pregnancy, antepartum   3. Excessive fetal growth affecting management of pregnancy in third trimester, single or unspecified fetus - EFW by Leopold's is normal, excellent pelvis for vaginal delivery  4. Anemia during pregnancy in third trimester - she is taking PNVs with iron  5. GBS (group B Streptococcus carrier), +RV culture, currently pregnant - treat in labor  Preterm labor symptoms and general obstetric precautions including but not limited to vaginal bleeding, contractions, leaking of fluid and fetal movement were reviewed in detail with the patient. Please refer to After Visit Summary for other counseling recommendations.   No follow-ups on file.  Future Appointments  Date Time Provider Department Center  05/28/2023  3:30 PM [facility]  06/04/2023  1:50 PM Lennart Pall, MD [facility] None  06/11/2023  1:50 PM Lennart Pall, MD [facility] None  06/18/2023  1:30 PM Lennart Pall, MD [facility] None    Allie Bossier, MD

## 2023-05-29 ENCOUNTER — Encounter: Payer: 59 | Admitting: Physical Therapy

## 2023-05-30 ENCOUNTER — Other Ambulatory Visit: Payer: Self-pay

## 2023-05-30 MED ORDER — DOXYLAMINE-PYRIDOXINE 10-10 MG PO TBEC: 2.0000 | DELAYED_RELEASE_TABLET | Freq: Every day | ORAL | 2 refills | Status: DC

## 2023-05-30 NOTE — Progress Notes (Signed)
 S/w pt and she state that she has been having vomiting and diarrhea. Sent safe med list and nausea medication per protocol. Advised pt that if she is unable to keep fluids down then be evaluated at the hospital. Pt agreed.

## 2023-06-04 ENCOUNTER — Ambulatory Visit: Payer: MEDICAID | Attending: Maternal & Fetal Medicine | Admitting: *Deleted

## 2023-06-04 ENCOUNTER — Ambulatory Visit: Payer: MEDICAID | Admitting: Obstetrics and Gynecology

## 2023-06-04 VITALS — Wt 184.0 lb

## 2023-06-04 VITALS — BP 99/62

## 2023-06-04 DIAGNOSIS — Z1339 Encounter for screening examination for other mental health and behavioral disorders: Secondary | ICD-10-CM

## 2023-06-04 DIAGNOSIS — Z3A38 38 weeks gestation of pregnancy: Secondary | ICD-10-CM

## 2023-06-04 DIAGNOSIS — F3164 Bipolar disorder, current episode mixed, severe, with psychotic features: Secondary | ICD-10-CM

## 2023-06-04 DIAGNOSIS — B9689 Other specified bacterial agents as the cause of diseases classified elsewhere: Secondary | ICD-10-CM

## 2023-06-04 DIAGNOSIS — Z8759 Personal history of other complications of pregnancy, childbirth and the puerperium: Secondary | ICD-10-CM | POA: Diagnosis not present

## 2023-06-04 DIAGNOSIS — O99013 Anemia complicating pregnancy, third trimester: Secondary | ICD-10-CM

## 2023-06-04 DIAGNOSIS — O0990 Supervision of high risk pregnancy, unspecified, unspecified trimester: Secondary | ICD-10-CM

## 2023-06-04 DIAGNOSIS — O09293 Supervision of pregnancy with other poor reproductive or obstetric history, third trimester: Secondary | ICD-10-CM | POA: Insufficient documentation

## 2023-06-04 DIAGNOSIS — O0993 Supervision of high risk pregnancy, unspecified, third trimester: Secondary | ICD-10-CM

## 2023-06-04 DIAGNOSIS — O99820 Streptococcus B carrier state complicating pregnancy: Secondary | ICD-10-CM

## 2023-06-04 DIAGNOSIS — N760 Acute vaginitis: Secondary | ICD-10-CM

## 2023-06-04 MED ORDER — METRONIDAZOLE 0.75 % VA GEL: 1.0000 | Freq: Every day | VAGINAL | 0 refills | Status: DC

## 2023-06-04 NOTE — Progress Notes (Signed)
   PRENATAL VISIT NOTE  Subjective:  Cheryl Cohen is a 33 y.o. [phone] at [redacted]w[redacted]d being seen today for ongoing prenatal care.  She is currently monitored for the following issues for this high-risk pregnancy and has Bipolar affective disorder, mixed, severe, with psychotic behavior (HCC); Delta-9-tetrahydrocannabinol (THC) dependence (HCC); Generalized anxiety disorder; History of IUFD; Supervision of high risk pregnancy, antepartum; History of gestational diabetes mellitus (GDM); Anemia during pregnancy in third trimester; and GBS (group B Streptococcus carrier), +RV culture, currently pregnant on their problem list.  Patient reports  vaginal discharge. Recently treated for yeast, those symptoms resolved but then she developed copious foul smelling discharge that started to look green this morning. Swab was positive for BV .  Contractions: Not present. Vag. Bleeding: None.  Movement: Present. Denies leaking of fluid.   The following portions of the patient's history were reviewed and updated as appropriate: allergies, current medications, past family history, past medical history, past social history, past surgical history and problem list.   Objective:   Vitals:   06/04/23 1352  Weight: 184 lb (83.5 kg)   Fetal Status: Fetal Heart Rate (bpm): 144   Movement: Present     General:  Alert, oriented and cooperative. Patient is in no acute distress.  Skin: Skin is warm and dry. No rash noted.   Cardiovascular: Normal heart rate noted  Respiratory: Normal respiratory effort, no problems with respiration noted  Abdomen: Soft, gravid, appropriate for gestational age.  Pain/Pressure: Absent      Assessment and Plan:  Pregnancy: [id number] at [redacted]w[redacted]d 1. Supervision of high risk pregnancy, antepartum (Primary) 2. [redacted] weeks gestation of pregnancy @ [redacted]w[redacted]d: 3324g (70%), cephalic, posterior fundal, normal AFI  3. Bipolar affective disorder, mixed, severe, with psychotic behavior [facility]) F/w  psychiatrist, continues on abilify injections No longer f/w therapist/counselor Reports mood is good  4. History of IUFD Weekly antenatal testing, NST scheduled with MFM today Growth [place] above Desires IOL at 39 weeks, scheduled for 3/7 at [redacted]w[redacted]d  5. Anemia during pregnancy in third trimester Not taking po iron, encouraged her to start  6. GBS (group B Streptococcus carrier), +RV culture, currently pregnant Amoxicillin allergy, dx 2015 after taking amox for strep throat, MD note with allergic dermatitis but not hives. Had previously tolerated a PCN prescription in 2014.  Will do ancef for GBS ppx  7. Bacterial vaginosis Requested metrogel. Reviewed recommendation for PO flagyl in pregnancy. Expressed her understanding and declines PO flagyl.  Rx for metrogel sent.   Please refer to After Visit Summary for other counseling recommendations.   Return in about 4 days (around 06/08/2023) for your induction!.  Future Appointments  Date Time Provider Department Center  06/08/2023  6:45 AM [facility] SCHED ROOM [facility] None  06/11/2023  1:50 PM Lennart Pall, MD [facility] None  06/18/2023  1:30 PM Lennart Pall, MD [facility] None   Lennart Pall, MD

## 2023-06-04 NOTE — Progress Notes (Signed)
 ROB, c/o green, foul odor vaginal discharge. Pt +BV and Yeast 05/14/23, Pt stated that she was not treated and would prefer to be treated with the gel.

## 2023-06-04 NOTE — Procedures (Signed)
 Cheryl Cohen 1991/02/15 [redacted]w[redacted]d  Fetus A Non-Stress Test Interpretation for 06/04/23  NST only  Indication:  hx IUFD  Fetal Heart Rate A Mode: External Baseline Rate (A): 135 bpm Variability: Moderate Accelerations: 15 x 15 Decelerations: None Multiple birth?: No  Uterine Activity Mode: Palpation, Toco Contraction Frequency (min): none Resting Tone Palpated: Relaxed  Interpretation (Fetal Testing) Nonstress Test Interpretation: Reactive Overall Impression: Reassuring for gestational age Comments: Dr. Judeth Cornfield reviewed tracing

## 2023-06-05 ENCOUNTER — Telehealth (HOSPITAL_COMMUNITY): Payer: Self-pay | Admitting: *Deleted

## 2023-06-05 ENCOUNTER — Encounter (HOSPITAL_COMMUNITY): Payer: Self-pay | Admitting: *Deleted

## 2023-06-05 NOTE — Telephone Encounter (Signed)
 Preadmission screen

## 2023-06-08 ENCOUNTER — Inpatient Hospital Stay (HOSPITAL_COMMUNITY): Admission: RE | Admit: 2023-06-08 | Payer: MEDICAID | Source: Home / Self Care | Admitting: Obstetrics and Gynecology

## 2023-06-08 ENCOUNTER — Inpatient Hospital Stay (HOSPITAL_COMMUNITY): Payer: MEDICAID | Attending: Obstetrics and Gynecology

## 2023-06-08 ENCOUNTER — Encounter (HOSPITAL_COMMUNITY): Payer: Self-pay | Admitting: Obstetrics and Gynecology

## 2023-06-08 ENCOUNTER — Inpatient Hospital Stay (HOSPITAL_COMMUNITY)
Admission: AD | Admit: 2023-06-08 | Discharge: 2023-06-08 | Disposition: A | Discharge status: Home / Self Care | Payer: MEDICAID | Source: Home / Self Care | Attending: Obstetrics and Gynecology | Admitting: Obstetrics and Gynecology

## 2023-06-08 DIAGNOSIS — Z3493 Encounter for supervision of normal pregnancy, unspecified, third trimester: Secondary | ICD-10-CM

## 2023-06-08 DIAGNOSIS — Z3A39 39 weeks gestation of pregnancy: Secondary | ICD-10-CM

## 2023-06-08 DIAGNOSIS — O479 False labor, unspecified: Secondary | ICD-10-CM

## 2023-06-08 DIAGNOSIS — F319 Bipolar disorder, unspecified: Secondary | ICD-10-CM | POA: Insufficient documentation

## 2023-06-08 DIAGNOSIS — O09293 Supervision of pregnancy with other poor reproductive or obstetric history, third trimester: Secondary | ICD-10-CM | POA: Insufficient documentation

## 2023-06-08 DIAGNOSIS — O99343 Other mental disorders complicating pregnancy, third trimester: Secondary | ICD-10-CM | POA: Insufficient documentation

## 2023-06-08 DIAGNOSIS — O99013 Anemia complicating pregnancy, third trimester: Secondary | ICD-10-CM | POA: Insufficient documentation

## 2023-06-08 LAB — POCT FERN TEST: POCT Fern Test: NEGATIVE — NL

## 2023-06-08 LAB — RUPTURE OF MEMBRANE (ROM)PLUS: Rom Plus: NEGATIVE

## 2023-06-08 NOTE — Discharge Instructions (Signed)
 We will call you back for your induction when a room becomes available.

## 2023-06-08 NOTE — MAU Note (Signed)
.  Cheryl Cohen is a 33 y.o. at [redacted]w[redacted]d here in MAU reporting: One gush of mucous that was water like around 0500 this morning. She reports there was a strip of pink within the mucous. Denies recent IC. Denies VB. +FM.   GBS+. Hx twin IUFD.   Onset of complaint: 0500 Pain score: Denies pain.  Vitals:   06/08/23 0810  BP: (!) 94/53  Pulse: (!) 102  Resp: 16  Temp: 97.7 F (36.5 C)  SpO2: 100%      FHT: 147 initial external Lab orders placed from triage: Crist Fat

## 2023-06-08 NOTE — MAU Provider Note (Signed)
 History    [id number]  Arrival date and time: 06/08/23 0756   Chief Complaint  Patient presents with   Rupture of Membranes   Vaginal Discharge   HPI Cheryl Cohen is a 33 y.o. at [redacted]w[redacted]d by [place] at [redacted]w[redacted]d with gush of fluid at 5am.   No continued LOF, no VB. Occ mild contractions, +FM.   She is scheduled for IOL today for history of IUFD.   Pregnancy is c/b: - GBS+ - rash (not hives). OK for ancef in labor - Hx IUFD - G1 pregnancy, twins at 25 weeks - Bipolar disorder - on injection abilify, f/w psychiatry - Anemia - third tri Hgb 10.8  B/Positive/-- (08/20 1151)  Past Medical History:  Diagnosis Date   Anxiety    Bipolar 1 disorder (HCC)    hospitalized  3 x onset rx in 9th grade    Family history of diabetes mellitus type II 01/11/2012   Gestational diabetes    Gonorrhea    Hx of varicella    Psychotic disorder with delusions (HCC)    Suicidal ideation 10/06/2013   Syphilis    Twin pregnancy, delivered vaginally, IUFD stillborns 09/09/2015   IUFD  22 + weeks  still borns     Past Surgical History:  Procedure Laterality Date   DILATION AND EVACUATION N/A 09/09/2015   Procedure: DILATATION AND EVACUATION;  Surgeon: [name], MD;  Location: [facility];  Service: Gynecology;  Laterality: N/A;   TYMPANOSTOMY TUBE PLACEMENT     asage 2-3 yrs    Family History  Problem Relation Age of Onset   Diabetes Mother    Arthritis Mother    Stroke Father    Hypertension Father    Alcohol abuse Father    Alcoholism Father     Social History   Socioeconomic History   Marital status: Single    Spouse name: Not on file   Number of children: Not on file   Years of education: Not on file   Highest education level: Some college, no degree  Occupational History    Comment: Geophysicist/field seismologist on [employer]  Tobacco Use   Smoking status: Former    Types: Cigars   Smokeless tobacco: Former   Tobacco comments:    1 cigarette a day  Advertising account planner   Vaping  status: Never Used  Substance and Sexual Activity   Alcohol use: Not Currently   Drug use: Not Currently    Types: Marijuana    Comment: Quit a few months ago   Sexual activity: Yes    Birth control/protection: None  Other Topics Concern   Not on file  Social History Narrative   hh of 4    Lives at home  [place] 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  In past    Worked also [employer]   Pet dog   Neg tad some caffiene       Now living at home  Had been with vine   Social Drivers of Health   Financial Resource Strain: Not on file  Food Insecurity: No Food Insecurity (06/08/2023)   Hunger Vital Sign    Worried About Running Out of Food in the Last Year: Never true    Ran Out of Food in the Last Year: Never true  Transportation Needs: No Transportation Needs (06/08/2023)   PRAPARE - Administrator, Civil Service (Medical): No    Lack of Transportation (Non-Medical): No  Physical Activity: Not  on file  Stress: Not on file  Social Connections: Unknown (06/08/2023)   Social Connection and Isolation Panel [NHANES]    Frequency of Communication with Friends and Family: More than three times a week    Frequency of Social Gatherings with Friends and Family: More than three times a week    Attends Religious Services: More than 4 times per year    Active Member of [employer] or Organizations: No    Attends Banker Meetings: Never    Marital Status: Patient declined  Catering manager Violence: Not At Risk (06/08/2023)   Humiliation, Afraid, Rape, and Kick questionnaire    Fear of Current or Ex-Partner: No    Emotionally Abused: No    Physically Abused: No    Sexually Abused: No    Allergies  Allergen Reactions   Amoxicillin Rash and Other (See Comments)    Has patient had a PCN reaction causing immediate rash, facial/tongue/throat swelling, SOB or lightheadedness with hypotension: yes Has patient had a PCN reaction causing severe rash involving mucus membranes or skin necrosis:  No Has patient had a PCN reaction that required hospitalization: No Has patient had a PCN reaction occurring within the last 10 years: Yes If all of the above answers are "NO", then may proceed with Cephalosporin use.     No current facility-administered medications on file prior to encounter.   Current Outpatient Medications on File Prior to Encounter  Medication Sig Dispense Refill   ARIPiprazole ER (ABILIFY MAINTENA) 400 MG SRER injection Inject 2 mLs (400 mg total) into the muscle every 28 (twenty-eight) days. (Due on 10-13-22): For mood stability 1 each 0   metroNIDAZOLE (METROGEL) 0.75 % vaginal gel Place 1 Applicatorful vaginally at bedtime. Apply one applicatorful to vagina at bedtime for 5 days 70 g 0   Prenatal Vit-Fe Fumarate-FA (PRENATAL VITAMIN PO) Take 1 tablet by mouth daily.     Doxylamine-Pyridoxine (DICLEGIS) 10-10 MG TBEC Take 2 tablets by mouth at bedtime. May add 1 tablet at breakfast &1 tablet at lunch if needed 100 tablet 2   Pertinent positives and negative per HPI, all others reviewed and negative  Physical Exam   BP 100/66   Pulse 90   Temp 97.7 F (36.5 C) (Oral)   Resp 16   Ht 5\' 8"  (1.727 m)   Wt 83.8 kg   LMP 09/17/2022 (Exact Date) Comment: Pt reports having an abortion end of May 2023  SpO2 98%   BMI 28.10 kg/m   Patient Vitals for the past 24 hrs:  BP Temp Temp src Pulse Resp SpO2 Height Weight  06/08/23 0953 100/66 -- -- 90 -- -- -- --  06/08/23 0940 -- -- -- -- -- 98 % -- --  06/08/23 0831 -- -- -- -- -- 96 % -- --  06/08/23 0810 (!) 94/53 97.7 F (36.5 C) Oral (!) 102 16 100 % 5\' 8"  (1.727 m) 83.8 kg    Physical Exam  Gen: A&Ox3, NAD, comfortable CV: Normal rate Resp: Normal effort Abd: Soft, non tender ROM+ negative, fern negative  FHT Baseline 140, moderate variability, +accels, no decels Toco: q35min Reactive and reassuring  Labs Results for orders placed or performed during the hospital encounter of 06/08/23 (from the past  24 hours)  Fern Test     Status: Normal   Collection Time: 06/08/23  8:41 AM  Result Value Ref Range   POCT Fern Test Negative = intact amniotic membranes   Rupture of Membrane (ROM) Plus  Status: None   Collection Time: 06/08/23  8:46 AM  Result Value Ref Range   Rom Plus NEGATIVE     Imaging No results found.  MAU Course  Procedures Lab Orders         Rupture of Membrane (ROM) Plus         Fern Test    No orders of the defined types were placed in this encounter.  Imaging Orders  No imaging studies ordered today    MDM moderate  Assessment and Plan  32yo presenting to r/o ROM  Membranes intact FHT reactive and reassuring Will return for IOL as scheduled when bed is available  Discharge home. Strict return precautions reviewed  Discharge Instructions     Activity as tolerated - No restrictions   Complete by: As directed    Call MD for:   Complete by: As directed    Worsening painful contractions, vaginal bleeding, water breaks, or baby not moving normally   Diet - low sodium heart healthy   Complete by: As directed        Lennart Pall, MD 06/08/23 10:10 AM  Allergies as of 06/08/2023       Reactions   Amoxicillin Rash, Other (See Comments)   Has patient had a PCN reaction causing immediate rash, facial/tongue/throat swelling, SOB or lightheadedness with hypotension: yes Has patient had a PCN reaction causing severe rash involving mucus membranes or skin necrosis: No Has patient had a PCN reaction that required hospitalization: No Has patient had a PCN reaction occurring within the last 10 years: Yes If all of the above answers are "NO", then may proceed with Cephalosporin use.        Medication List     STOP taking these medications    benzocaine 10 % mucosal gel Commonly known as: ORAJEL   hydrOXYzine 25 MG tablet Commonly known as: ATARAX   lithium carbonate 450 MG ER tablet Commonly known as: ESKALITH   metroNIDAZOLE 500 MG  tablet Commonly known as: Flagyl   propranolol 10 MG tablet Commonly known as: INDERAL   terconazole 0.4 % vaginal cream Commonly known as: TERAZOL 7   terconazole 0.8 % vaginal cream Commonly known as: TERAZOL 3   traZODone 50 MG tablet Commonly known as: DESYREL       TAKE these medications    ARIPiprazole ER 400 MG Srer injection Commonly known as: ABILIFY MAINTENA Inject 2 mLs (400 mg total) into the muscle every 28 (twenty-eight) days. (Due on 10-13-22): For mood stability What changed: Another medication with the same name was removed. Continue taking this medication, and follow the directions you see here.   Doxylamine-Pyridoxine 10-10 MG Tbec Commonly known as: Diclegis Take 2 tablets by mouth at bedtime. May add 1 tablet at breakfast &1 tablet at lunch if needed   metroNIDAZOLE 0.75 % vaginal gel Commonly known as: METROGEL Place 1 Applicatorful vaginally at bedtime. Apply one applicatorful to vagina at bedtime for 5 days   PRENATAL VITAMIN PO Take 1 tablet by mouth daily.

## 2023-06-09 ENCOUNTER — Encounter (HOSPITAL_COMMUNITY): Payer: Self-pay | Admitting: Obstetrics and Gynecology

## 2023-06-09 ENCOUNTER — Other Ambulatory Visit: Payer: Self-pay

## 2023-06-09 ENCOUNTER — Inpatient Hospital Stay (HOSPITAL_COMMUNITY): Payer: MEDICAID | Admitting: Anesthesiology

## 2023-06-09 ENCOUNTER — Inpatient Hospital Stay (HOSPITAL_COMMUNITY)
Admission: AD | Admit: 2023-06-09 | Discharge: 2023-06-10 | DRG: 807 | Disposition: A | Discharge status: Home / Self Care | Payer: MEDICAID | Attending: Obstetrics & Gynecology | Admitting: Obstetrics & Gynecology

## 2023-06-09 DIAGNOSIS — Z8249 Family history of ischemic heart disease and other diseases of the circulatory system: Secondary | ICD-10-CM

## 2023-06-09 DIAGNOSIS — O99824 Streptococcus B carrier state complicating childbirth: Principal | ICD-10-CM | POA: Diagnosis present

## 2023-06-09 DIAGNOSIS — O09292 Supervision of pregnancy with other poor reproductive or obstetric history, second trimester: Secondary | ICD-10-CM | POA: Diagnosis not present

## 2023-06-09 DIAGNOSIS — O99820 Streptococcus B carrier state complicating pregnancy: Secondary | ICD-10-CM

## 2023-06-09 DIAGNOSIS — O99013 Anemia complicating pregnancy, third trimester: Secondary | ICD-10-CM | POA: Diagnosis present

## 2023-06-09 DIAGNOSIS — Z3A39 39 weeks gestation of pregnancy: Secondary | ICD-10-CM

## 2023-06-09 DIAGNOSIS — Z8759 Personal history of other complications of pregnancy, childbirth and the puerperium: Secondary | ICD-10-CM

## 2023-06-09 DIAGNOSIS — F3164 Bipolar disorder, current episode mixed, severe, with psychotic features: Secondary | ICD-10-CM | POA: Diagnosis present

## 2023-06-09 DIAGNOSIS — O0990 Supervision of high risk pregnancy, unspecified, unspecified trimester: Principal | ICD-10-CM

## 2023-06-09 DIAGNOSIS — O479 False labor, unspecified: Secondary | ICD-10-CM | POA: Diagnosis not present

## 2023-06-09 DIAGNOSIS — Z833 Family history of diabetes mellitus: Secondary | ICD-10-CM

## 2023-06-09 DIAGNOSIS — Z87891 Personal history of nicotine dependence: Secondary | ICD-10-CM | POA: Diagnosis not present

## 2023-06-09 DIAGNOSIS — Z880 Allergy status to penicillin: Secondary | ICD-10-CM | POA: Diagnosis not present

## 2023-06-09 DIAGNOSIS — O26893 Other specified pregnancy related conditions, third trimester: Secondary | ICD-10-CM | POA: Diagnosis present

## 2023-06-09 DIAGNOSIS — Z8632 Personal history of gestational diabetes: Secondary | ICD-10-CM | POA: Diagnosis present

## 2023-06-09 DIAGNOSIS — O99344 Other mental disorders complicating childbirth: Secondary | ICD-10-CM | POA: Diagnosis not present

## 2023-06-09 DIAGNOSIS — Z3490 Encounter for supervision of normal pregnancy, unspecified, unspecified trimester: Secondary | ICD-10-CM | POA: Diagnosis present

## 2023-06-09 LAB — CBC
HCT: 33.1 % — ABNORMAL LOW (ref 36.0–46.0)
Hemoglobin: 10.7 g/dL — ABNORMAL LOW (ref 12.0–15.0)
MCH: 26.6 pg (ref 26.0–34.0)
MCHC: 32.3 g/dL (ref 30.0–36.0)
MCV: 82.3 fL (ref 80.0–100.0)
Platelets: 292 10*3/uL (ref 150–400)
RBC: 4.02 MIL/uL (ref 3.87–5.11)
RDW: 14.6 % (ref 11.5–15.5)
WBC: 12.4 10*3/uL — ABNORMAL HIGH (ref 4.0–10.5)
nRBC: 0 % (ref 0.0–0.2)

## 2023-06-09 LAB — TYPE AND SCREEN
ABO/RH(D): B POS
Antibody Screen: NEGATIVE

## 2023-06-09 LAB — RPR: RPR Ser Ql: NONREACTIVE

## 2023-06-09 MED ORDER — DIPHENHYDRAMINE HCL 50 MG/ML IJ SOLN: 12.5000 mg | INTRAMUSCULAR | Status: DC | PRN

## 2023-06-09 MED ORDER — ACETAMINOPHEN 325 MG PO TABS: 650.0000 mg | ORAL_TABLET | ORAL | Status: DC | PRN

## 2023-06-09 MED ORDER — WITCH HAZEL-GLYCERIN EX PADS: 1.0000 | MEDICATED_PAD | CUTANEOUS | Status: DC | PRN

## 2023-06-09 MED ORDER — LACTATED RINGERS IV SOLN: INTRAVENOUS | Status: DC

## 2023-06-09 MED ORDER — LACTATED RINGERS IV SOLN
500.0000 mL | Freq: Once | INTRAVENOUS | Status: AC
  Administered 2023-06-09: 500 mL via INTRAVENOUS

## 2023-06-09 MED ORDER — LIDOCAINE HCL (PF) 1 % IJ SOLN: 30.0000 mL | INTRAMUSCULAR | Status: DC | PRN

## 2023-06-09 MED ORDER — OXYCODONE HCL 5 MG PO TABS: 5.0000 mg | ORAL_TABLET | ORAL | Status: DC | PRN

## 2023-06-09 MED ORDER — PHENYLEPHRINE 80 MCG/ML (10ML) SYRINGE FOR IV PUSH (FOR BLOOD PRESSURE SUPPORT): 80.0000 ug | PREFILLED_SYRINGE | INTRAVENOUS | Status: DC | PRN

## 2023-06-09 MED ORDER — LEVONORGESTREL 20 MCG/DAY IU IUD
1.0000 | INTRAUTERINE_SYSTEM | Freq: Once | INTRAUTERINE | Status: AC
  Administered 2023-06-09: 1 via INTRAUTERINE
  Filled 2023-06-09: qty 1

## 2023-06-09 MED ORDER — METRONIDAZOLE 0.75 % VA GEL
1.0000 | Freq: Every day | VAGINAL | Status: DC
  Filled 2023-06-09: qty 70

## 2023-06-09 MED ORDER — TETANUS-DIPHTH-ACELL PERTUSSIS 5-2.5-18.5 LF-MCG/0.5 IM SUSY: 0.5000 mL | PREFILLED_SYRINGE | Freq: Once | INTRAMUSCULAR | Status: DC

## 2023-06-09 MED ORDER — SODIUM CHLORIDE 0.9% FLUSH: 3.0000 mL | INTRAVENOUS | Status: DC | PRN

## 2023-06-09 MED ORDER — LIDOCAINE HCL (PF) 1 % IJ SOLN
INTRAMUSCULAR | Status: DC | PRN
  Administered 2023-06-09 (×2): 4 mL via EPIDURAL

## 2023-06-09 MED ORDER — FENTANYL-BUPIVACAINE-NACL 0.5-0.125-0.9 MG/250ML-% EP SOLN
12.0000 mL/h | EPIDURAL | Status: DC | PRN
  Administered 2023-06-09: 12 mL/h via EPIDURAL
  Filled 2023-06-09: qty 250

## 2023-06-09 MED ORDER — TERBUTALINE SULFATE 1 MG/ML IJ SOLN: 0.2500 mg | Freq: Once | INTRAMUSCULAR | Status: DC | PRN

## 2023-06-09 MED ORDER — SENNOSIDES-DOCUSATE SODIUM 8.6-50 MG PO TABS: 2.0000 | ORAL_TABLET | ORAL | Status: DC

## 2023-06-09 MED ORDER — ONDANSETRON HCL 4 MG/2ML IJ SOLN: 4.0000 mg | INTRAMUSCULAR | Status: DC | PRN

## 2023-06-09 MED ORDER — SIMETHICONE 80 MG PO CHEW: 80.0000 mg | CHEWABLE_TABLET | ORAL | Status: DC | PRN

## 2023-06-09 MED ORDER — FENTANYL CITRATE (PF) 100 MCG/2ML IJ SOLN: 50.0000 ug | INTRAMUSCULAR | Status: DC | PRN

## 2023-06-09 MED ORDER — IBUPROFEN 600 MG PO TABS
600.0000 mg | ORAL_TABLET | Freq: Four times a day (QID) | ORAL | Status: DC
  Administered 2023-06-09 – 2023-06-10 (×4): 600 mg via ORAL
  Filled 2023-06-09 (×4): qty 1

## 2023-06-09 MED ORDER — LACTATED RINGERS IV SOLN
500.0000 mL | INTRAVENOUS | Status: DC | PRN
Start: 2023-06-09 — End: 2023-06-09

## 2023-06-09 MED ORDER — PRENATAL MULTIVITAMIN CH
1.0000 | ORAL_TABLET | Freq: Every day | ORAL | Status: DC
  Administered 2023-06-10: 1 via ORAL
  Filled 2023-06-09: qty 1

## 2023-06-09 MED ORDER — SODIUM CHLORIDE 0.9% FLUSH: 3.0000 mL | Freq: Two times a day (BID) | INTRAVENOUS | Status: DC

## 2023-06-09 MED ORDER — BENZOCAINE-MENTHOL 20-0.5 % EX AERO: 1.0000 | INHALATION_SPRAY | CUTANEOUS | Status: DC | PRN

## 2023-06-09 MED ORDER — DIBUCAINE (PERIANAL) 1 % EX OINT: 1.0000 | TOPICAL_OINTMENT | CUTANEOUS | Status: DC | PRN

## 2023-06-09 MED ORDER — SODIUM CHLORIDE 0.9 % IV SOLN: 250.0000 mL | INTRAVENOUS | Status: DC | PRN

## 2023-06-09 MED ORDER — EPHEDRINE 5 MG/ML INJ: 10.0000 mg | INTRAVENOUS | Status: DC | PRN

## 2023-06-09 MED ORDER — SOD CITRATE-CITRIC ACID 500-334 MG/5ML PO SOLN: 30.0000 mL | ORAL | Status: DC | PRN

## 2023-06-09 MED ORDER — ONDANSETRON HCL 4 MG/2ML IJ SOLN
4.0000 mg | Freq: Four times a day (QID) | INTRAMUSCULAR | Status: DC | PRN
  Administered 2023-06-09: 4 mg via INTRAVENOUS
  Filled 2023-06-09: qty 2

## 2023-06-09 MED ORDER — MISOPROSTOL 25 MCG QUARTER TABLET: 25.0000 ug | ORAL_TABLET | Freq: Once | ORAL | Status: DC

## 2023-06-09 MED ORDER — OXYTOCIN-SODIUM CHLORIDE 30-0.9 UT/500ML-% IV SOLN
2.5000 [IU]/h | INTRAVENOUS | Status: DC
  Administered 2023-06-09: 2.5 [IU]/h via INTRAVENOUS
  Filled 2023-06-09: qty 500

## 2023-06-09 MED ORDER — OXYTOCIN BOLUS FROM INFUSION
333.0000 mL | Freq: Once | INTRAVENOUS | Status: AC
  Administered 2023-06-09: 333 mL via INTRAVENOUS

## 2023-06-09 MED ORDER — OXYTOCIN-SODIUM CHLORIDE 30-0.9 UT/500ML-% IV SOLN
1.0000 m[IU]/min | INTRAVENOUS | Status: DC
  Administered 2023-06-09: 2 m[IU]/min via INTRAVENOUS

## 2023-06-09 MED ORDER — MEASLES, MUMPS & RUBELLA VAC IJ SOLR: 0.5000 mL | Freq: Once | INTRAMUSCULAR | Status: DC

## 2023-06-09 MED ORDER — MISOPROSTOL 50MCG HALF TABLET: 50.0000 ug | ORAL_TABLET | Freq: Once | ORAL | Status: DC

## 2023-06-09 MED ORDER — METRONIDAZOLE 0.75 % VA GEL: 1.0000 | Freq: Every day | VAGINAL | Status: DC

## 2023-06-09 MED ORDER — ONDANSETRON HCL 4 MG PO TABS: 4.0000 mg | ORAL_TABLET | ORAL | Status: DC | PRN

## 2023-06-09 MED ORDER — ARIPIPRAZOLE ER 400 MG IM SRER: 400.0000 mg | INTRAMUSCULAR | Status: DC

## 2023-06-09 MED ORDER — CEFAZOLIN SODIUM-DEXTROSE 1-4 GM/50ML-% IV SOLN: 1.0000 g | Freq: Three times a day (TID) | INTRAVENOUS | Status: DC

## 2023-06-09 MED ORDER — ZOLPIDEM TARTRATE 5 MG PO TABS: 5.0000 mg | ORAL_TABLET | Freq: Every evening | ORAL | Status: DC | PRN

## 2023-06-09 MED ORDER — COCONUT OIL OIL: 1.0000 | TOPICAL_OIL | Status: DC | PRN

## 2023-06-09 MED ORDER — CEFAZOLIN SODIUM-DEXTROSE 2-4 GM/100ML-% IV SOLN
2.0000 g | Freq: Once | INTRAVENOUS | Status: AC
  Administered 2023-06-09: 2 g via INTRAVENOUS
  Filled 2023-06-09: qty 100

## 2023-06-09 MED ORDER — DIPHENHYDRAMINE HCL 25 MG PO CAPS: 25.0000 mg | ORAL_CAPSULE | Freq: Four times a day (QID) | ORAL | Status: DC | PRN

## 2023-06-09 NOTE — Anesthesia Procedure Notes (Signed)
 Epidural Patient location during procedure: OB Start time: 06/09/2023 4:55 AM End time: 06/09/2023 5:00 AM  Staffing Anesthesiologist: Linton Rump, MD Performed: anesthesiologist   Preanesthetic Checklist Completed: patient identified, IV checked, site marked, risks and benefits discussed, surgical consent, monitors and equipment checked, pre-op evaluation and timeout performed  Epidural Patient position: sitting Prep: DuraPrep and site prepped and draped Patient monitoring: continuous pulse ox and blood pressure Approach: midline Location: L3-L4 Injection technique: LOR saline  Needle:  Needle type: Tuohy  Needle gauge: 17 G Needle length: 9 cm and 9 Needle insertion depth: 5 cm Catheter type: closed end flexible Catheter size: 19 Gauge Catheter at skin depth: 10 cm Test dose: negative  Assessment Events: blood not aspirated, no cerebrospinal fluid, injection not painful, no injection resistance, no paresthesia and negative IV test  Additional Notes The patient has requested an epidural for labor pain management. Risks and benefits including, but not limited to, infection, bleeding, local anesthetic toxicity, headache, hypotension, back pain, block failure, etc. were discussed with the patient. The patient expressed understanding and consented to the procedure. I confirmed that the patient has no bleeding disorders and is not taking blood thinners. I confirmed the patient's last platelet count with the nurse. A time-out was performed immediately prior to the procedure. Please see nursing documentation for vital signs. Sterile technique was used throughout the whole procedure. Once LOR achieved, the epidural catheter threaded easily without resistance. Aspiration of the catheter was negative for blood and CSF. The epidural was dosed slowly and an infusion was started.  1 attempt(s)Reason for block:procedure for pain

## 2023-06-09 NOTE — Progress Notes (Signed)
 Labor Progress Note Kailei Suellen Durocher is a 33 y.o. [phone] at [redacted]w[redacted]d presented for SOL S: Comfortable with epidural. Discussed role, risks, benefits of IUPC; pt agreeable  O:  BP (!) 99/54   Pulse 84   Temp 98 F (36.7 C) (Oral)   Resp 17   Ht 5\' 7"  (1.702 m)   Wt 83.1 kg   LMP 09/17/2022 (Exact Date) Comment: Pt reports having an abortion end of May 2023  SpO2 98%   BMI 28.71 kg/m   EFM: baseline 120, accels, no decels, moderate variability TOCO: q contractions  CVE: Dilation: 8 Effacement (%): 80 Station: -1 Presentation: Vertex Exam by:: Delrae Sawyers RN   A&P: 33 y.o. [id number] [redacted]w[redacted]d admitted for SOL #Labor: Progressing well. AROM clear.  Consider pitocin augmentation, if not delivered prior to next check.  #Pain: Epidural #FWB: Cat I #GBS positive; cefazolin  Wyn Forster, MD 10:38 AM

## 2023-06-09 NOTE — Anesthesia Preprocedure Evaluation (Addendum)
 Anesthesia Evaluation  Patient identified by MRN, date of birth, ID band Patient awake    Reviewed: Allergy & Precautions, NPO status , Patient's Chart, lab work & pertinent test results  History of Anesthesia Complications Negative for: history of anesthetic complications  Airway Mallampati: III  TM Distance: >3 FB Neck ROM: Full    Dental   Pulmonary former smoker   Pulmonary exam normal breath sounds clear to auscultation       Cardiovascular (-) hypertension+ dysrhythmias (iRBBB)  Rhythm:Regular Rate:Normal     Neuro/Psych  PSYCHIATRIC DISORDERS Anxiety  Bipolar Disorder   negative neurological ROS     GI/Hepatic negative GI ROS, Neg liver ROS,,,  Endo/Other  negative endocrine ROS    Renal/GU negative Renal ROS     Musculoskeletal   Abdominal   Peds  Hematology  (+) Blood dyscrasia, anemia Lab Results      Component                Value               Date                      WBC                      12.4 (H)            06/09/2023                HGB                      10.7 (L)            06/09/2023                HCT                      33.1 (L)            06/09/2023                MCV                      82.3                06/09/2023                PLT                      292                 06/09/2023              Anesthesia Other Findings H/o IUFD  Reproductive/Obstetrics (+) Pregnancy                             Anesthesia Physical Anesthesia Plan  ASA: 2  Anesthesia Plan: Epidural   Post-op Pain Management:    Induction:   PONV Risk Score and Plan:   Airway Management Planned: Natural Airway  Additional Equipment:   Intra-op Plan:   Post-operative Plan:   Informed Consent: I have reviewed the patients History and Physical, chart, labs and discussed the procedure including the risks, benefits and alternatives for the proposed anesthesia with the patient or  authorized representative who has indicated his/her understanding and acceptance.       Plan Discussed with:  Anesthesiologist  Anesthesia Plan Comments: (I have discussed risks of neuraxial anesthesia including but not limited to infection, bleeding, nerve injury, back pain, headache, seizures, and failure of block. Patient denies bleeding disorders and is not currently anticoagulated. Labs have been reviewed. Risks and benefits discussed. All patient's questions answered.  )        Anesthesia Quick Evaluation

## 2023-06-09 NOTE — MAU Note (Signed)
 Pt says she came yesterday at 0700- Is sch for an induction   was not called  but she came in for mucus plug .  She was evaluated for SROM - no VE- no UC's  So she went home. Tonight UC's strong since 6pm. PNC- Famina Denies HSV GBS- positive  Feels baby moving

## 2023-06-09 NOTE — MAU Note (Signed)

## 2023-06-09 NOTE — Discharge Summary (Signed)
 Postpartum Discharge Summary  Date of Service updated***     Patient Name: Cheryl Cohen DOB: 09-Jun-1990 MRN: [id number]  Date of admission: 06/09/2023 Delivery date:06/09/2023 Delivering provider:   Date of discharge: 06/09/2023  Admitting diagnosis: Encounter for induction of labor [Z34.90] Intrauterine pregnancy: [redacted]w[redacted]d     Secondary diagnosis:  Principal Problem:   Encounter for induction of labor Active Problems:   NSVD (normal spontaneous vaginal delivery)  Additional problems: ***    Discharge diagnosis: Term Pregnancy Delivered and Anemia                                              Post partum procedures: Post placental Mirena IUD placed Augmentation: AROM and Pitocin Complications: {OB Labor/Delivery Complications:20784}  Hospital course: Onset of Labor With Vaginal Delivery      33 y.o. yo [id number] at 106w1d was admitted in Active Labor on 06/09/2023. Labor course was uncomplicated.  Membrane Rupture Time/Date: 10:40 AM,06/09/2023  Delivery Method:  Operative Delivery:N/A Episiotomy:   Lacerations:    Patient had a postpartum course complicated by ***.  She is ambulating, tolerating a regular diet, passing flatus, and urinating well. Patient is discharged home in stable condition on 06/09/23.  Newborn Data: Birth date:06/09/2023 Birth time:12:15 PM Gender:Female Living status:  Apgars:8 ,9  Weight:   Magnesium Sulfate received: {Mag received:30440022} BMZ received: No Rhophylac:N/A MMR:Yes T-DaP:Given prenatally Flu: Yes RSV Vaccine received: No Transfusion:{Transfusion received:30440034}  Immunizations received: Immunization History  Administered Date(s) Administered   Influenza Split 01/11/2012   Influenza, Seasonal, Injecte, Preservative Fre 01/03/2023, 03/29/2023   Influenza,inj,Quad PF,6+ Mos 02/21/2016, 01/01/2019   Influenza-Unspecified 03/18/2020, 04/03/2020   PFIZER(Purple Top)SARS-COV-2 Vaccination 09/06/2019, 09/29/2019   PPD Test  11/03/2015, 06/30/2020   Tdap 08/20/2020, 03/29/2023    Physical exam  Vitals:   06/09/23 1031 06/09/23 1101 06/09/23 1131 06/09/23 1201  BP: (!) 99/54 (!) 97/52 (!) 100/58 103/61  Pulse: 84 81 82 86  Resp: 17  16 17   Temp:    97.9 F (36.6 C)  TempSrc:    Oral  SpO2:      Weight:      Height:       General: {Exam; general:21111117} Lochia: {Desc; appropriate/inappropriate:30686::"appropriate"} Uterine Fundus: {Desc; firm/soft:30687} Incision: {Exam; incision:21111123} DVT Evaluation: {Exam; dvt:2111122} Labs: Lab Results  Component Value Date   WBC 12.4 (H) 06/09/2023   HGB 10.7 (L) 06/09/2023   HCT 33.1 (L) 06/09/2023   MCV 82.3 06/09/2023   PLT 292 06/09/2023      Latest Ref Rng & Units 09/10/2022    2:28 PM  CMP  Glucose 70 - 99 mg/dL 92   BUN 6 - 20 mg/dL 10   Creatinine 6.96 - 1.00 mg/dL 2.95   Sodium 284 - 132 mmol/L 139   Potassium 3.5 - 5.1 mmol/L 3.8   Chloride 98 - 111 mmol/L 104   CO2 22 - 32 mmol/L 25   Calcium 8.9 - 10.3 mg/dL 9.3   Total Protein 6.5 - 8.1 g/dL 6.9   Total Bilirubin 0.3 - 1.2 mg/dL 0.7   Alkaline Phos 38 - 126 U/L 46   AST 15 - 41 U/L 28   ALT 0 - 44 U/L 21    Edinburgh Score:    11/30/2020    1:13 PM  Edinburgh Postnatal Depression Scale Screening Tool  I have been able to laugh  and see the funny side of things. 0  I have looked forward with enjoyment to things. 0  I have blamed myself unnecessarily when things went wrong. 0  I have been anxious or worried for no good reason. 0  I have felt scared or panicky for no good reason. 0  Things have been getting on top of me. 2  I have been so unhappy that I have had difficulty sleeping. 0  I have felt sad or miserable. 0  I have been so unhappy that I have been crying. 0  The thought of harming myself has occurred to me. 0  Edinburgh Postnatal Depression Scale Total 2   No data recorded  After visit meds:  Allergies as of 06/09/2023       Reactions   Amoxicillin Rash, Other  (See Comments)   Has patient had a PCN reaction causing immediate rash, facial/tongue/throat swelling, SOB or lightheadedness with hypotension: yes Has patient had a PCN reaction causing severe rash involving mucus membranes or skin necrosis: No Has patient had a PCN reaction that required hospitalization: No Has patient had a PCN reaction occurring within the last 10 years: Yes If all of the above answers are "NO", then may proceed with Cephalosporin use.     Med Rec must be completed prior to using this [facility]***        Discharge home in stable condition Infant Feeding: Bottle and Breast Infant Disposition:home with mother Discharge instruction: per After Visit Summary and Postpartum booklet. Activity: Advance as tolerated. Pelvic rest for 6 weeks.  Diet: routine diet Future Appointments:No future appointments. Follow up Visit:  Follow-up Information     [facility] Follow up.   Why: Routine postpartum care at 4-6 weeks Contact information: [address] [place] [address] [phone]               Message sent to [facility] 3/8  Please schedule this patient for a In person postpartum visit in 4 weeks with the following provider: Any provider. Additional Postpartum F/U:Postpartum Depression checkup  Low risk pregnancy complicated by:  hx IUFD twins Delivery mode:    Anticipated Birth Control:  PP IUD placed   06/09/2023 Wyn Forster, MD

## 2023-06-09 NOTE — Plan of Care (Signed)
PT DEMONSTRATED UNDERSTANDING 

## 2023-06-09 NOTE — Progress Notes (Signed)
 Labor Progress Note Cheryl Cohen is a 33 y.o. [id number] at [redacted]w[redacted]d presented for SOL S: Comfortable with epidural. Slow progression since arrival. Pt agreeable to starting pitocin now.   O:  BP (!) 100/58   Pulse 82   Temp 98 F (36.7 C) (Oral)   Resp 16   Ht 5\' 7"  (1.702 m)   Wt 83.1 kg   LMP 09/17/2022 (Exact Date) Comment: Pt reports having an abortion end of May 2023  SpO2 98%   BMI 28.71 kg/m   EFM: baseline 150, accels, no decels, moderate variability TOCO: q2-41min contractions, but MVUs 60  CVE: Dilation: 7 Effacement (%): 90 Station: -1 Presentation: Vertex Exam by:: Delrae Sawyers RN   A&P: 33 y.o. [id number] [redacted]w[redacted]d admitted for SOL #Labor: Progressing slowly since arrival. AROM clear with last check. Pt agreeable to start pitocin 2x2 given inadequate MVUs.  #Pain: Epidural #FWB: Cat I #GBS positive; cefazolin due to allergy  Wyn Forster, MD 11:40 AM

## 2023-06-09 NOTE — H&P (Signed)
 Cheryl Cohen is a 33 y.o. female [phone]  at [redacted]w[redacted]d presenting for active labor. Pt scheduled for induction but was not called today due to staffing.  She presented with painful regular contractions and was 5 cm/70%/-2 on arrival to MAU.  Hx significant for bipolar disorder, canniboid use, generalized anxiety disorder, Hx of IUFD mono/di twins at 25 weeks, Hx GDM previous pregnancy, anemia this pregnancy, and GBS positive.  [place] MFM OB Follow up on 05/28/23 at 37 weeks: FHR 140, cephalic presentation, posterior fundal placenta, AFI 13.42, EFW 3324 g, 70%.   NURSING  PROVIDER  Office Location [facility] Dating by LMP c/w U/S at 9 wks  [facility] Model Traditional Anatomy U/S WNL  Initiated care at  9wks                Language  English              LAB RESULTS   Support Person [employer] NIPS: LR AFP:     NT/IT (FT only)     Carrier Screen Horizon: neg x 4  Rhogam  B/Positive/-- (08/20 1151) A1C/GTT Early: 5.6  86/118/96 Third trimester: normal  Flu Vaccine 03/29/23    TDaP Vaccine  03/29/23 Blood Type B/Positive/-- (08/20 1151)B positive  Covid Vaccine Yes Antibody Negative (08/20 1151)negative    Rubella 1.00 (08/20 1151)immune  Feeding Plan both RPR Non Reactive (12/26 0834)  Contraception IUD HBsAg Negative (08/20 1151)  Circumcision Yes HIV Non Reactive (12/26 0834)  Pediatrician  [facility] HCVAb Non Reactive (08/20 1151)negative  Prenatal Classes       Pap Diagnosis  Date Value Ref Range Status  05/24/2022   Final   - Negative for intraepithelial lesion or malignancy (NILM)    BTLConsent  GC/CT Initial:  neg/neg 36wks:    VBAC  Consent  GBS Positive/-- (02/17 1514) For PCN allergy, check sensitivities        DME Rx [X]  BP cuff [ ]  Weight Scale Waterbirth  [ ]  Class [ ]  Consent [ ]  CNM visit  PHQ9 & GAD7 [X]  new OB [  ] 28 weeks  [X]  36 weeks Induction  [ ]  Orders Entered [ ] Foley Y/N   OB History     Gravida  7   Para  3   Term  1   Preterm  2    AB  3   Living  1      SAB  0   IAB  3   Ectopic  0   Multiple  1   Live Births  1          Past Medical History:  Diagnosis Date   Anxiety    Bipolar 1 disorder (HCC)    hospitalized  3 x onset rx in 9th grade    Family history of diabetes mellitus type II 01/11/2012   Gestational diabetes    Gonorrhea    Hx of varicella    Psychotic disorder with delusions (HCC)    Suicidal ideation 10/06/2013   Syphilis    Twin pregnancy, delivered vaginally, IUFD stillborns 09/09/2015   IUFD  22 + weeks  still borns    Past Surgical History:  Procedure Laterality Date   DILATION AND EVACUATION N/A 09/09/2015   Procedure: DILATATION AND EVACUATION;  Surgeon: [name], MD;  Location: [facility];  Service: Gynecology;  Laterality: N/A;   TYMPANOSTOMY TUBE PLACEMENT     asage 2-3 yrs   Family History: family history  includes Alcohol abuse in her father; Alcoholism in her father; Arthritis in her mother; Diabetes in her mother; Hypertension in her father; Stroke in her father. Social History:  reports that she has quit smoking. Her smoking use included cigars. She has quit using smokeless tobacco. She reports that she does not currently use alcohol. She reports that she does not currently use drugs after having used the following drugs: Marijuana.     Maternal Diabetes: No Genetic Screening: Normal Maternal Ultrasounds/Referrals: Normal Fetal Ultrasounds or other Referrals:  None Maternal Substance Abuse:  No Significant Maternal Medications:  None Significant Maternal Lab Results:  Group B Strep positive Number of Prenatal Visits:greater than 3 verified prenatal visits Maternal Vaccinations:TDap, Flu, and Covid Other Comments:  None  Review of Systems  Constitutional:  Negative for chills, fatigue and fever.  Eyes:  Negative for visual disturbance.  Respiratory:  Negative for shortness of breath.   Cardiovascular:  Negative for chest pain.  Gastrointestinal:  Positive  for abdominal pain. Negative for vomiting.  Genitourinary:  Negative for difficulty urinating, dysuria, flank pain, pelvic pain, vaginal bleeding, vaginal discharge and vaginal pain.  Neurological:  Negative for dizziness and headaches.  Psychiatric/Behavioral: Negative.     Maternal Medical History:  Reason for admission: Contractions.   Contractions: Onset was 3-5 hours ago.   Frequency: regular.   Perceived severity is moderate.   Fetal activity: Perceived fetal activity is normal.   Prenatal Complications - Diabetes: none.   Dilation: 7 Effacement (%): 100 Station: 0 Exam by:: Sharen Counter CNM Blood pressure (!) 95/53, pulse 90, temperature 98 F (36.7 C), temperature source Oral, resp. rate 20, height 5\' 7"  (1.702 m), weight 83.1 kg, last menstrual period 09/17/2022, SpO2 98%. Maternal Exam:  Uterine Assessment: Contraction strength is moderate.  Contraction duration is 5 minutes. Contraction frequency is regular.  Abdomen: Fetal presentation: vertex Cervix: Cervix evaluated by digital exam.     Fetal Exam Fetal Monitor Review: Mode: ultrasound.   Baseline rate: 130.  Variability: moderate (6-25 bpm).   Pattern: accelerations present and no decelerations.   Fetal State Assessment: Category I - tracings are normal.   Physical Exam Vitals and nursing note reviewed.  Constitutional:      Appearance: She is well-developed.  Cardiovascular:     Rate and Rhythm: Normal rate.     Heart sounds: Normal heart sounds.  Pulmonary:     Effort: Pulmonary effort is normal.     Breath sounds: Normal breath sounds.  Abdominal:     Palpations: Abdomen is soft.  Musculoskeletal:        General: Normal range of motion.     Cervical back: Normal range of motion.  Skin:    General: Skin is warm and dry.  Neurological:     Mental Status: She is alert and oriented to person, place, and time.  Psychiatric:        Behavior: Behavior normal.        Thought Content: Thought  content normal.        Judgment: Judgment normal.     Prenatal labs: ABO, Rh: --/--/B POS (03/08 0328) Antibody: NEG (03/08 0328) Rubella: 1.00 (08/20 1151) RPR: Non Reactive (12/26 0834)  HBsAg: Negative (08/20 1151)  HIV: Non Reactive (12/26 0834)  GBS: Positive/-- (02/17 1514)   Assessment/Plan: [id number]  at [redacted]w[redacted]d  admitted for active labor at term GBS positive, PCN allergy  Admit to L&D Ancef for GBS positive, mild allergy Pt plans to breast and formula  feed Postplacental Mirena IUD planned  Sharen Counter 06/09/2023, 9:52 AM

## 2023-06-10 LAB — CBC
HCT: 30.7 % — ABNORMAL LOW (ref 36.0–46.0)
Hemoglobin: 10.1 g/dL — ABNORMAL LOW (ref 12.0–15.0)
MCH: 26.6 pg (ref 26.0–34.0)
MCHC: 32.9 g/dL (ref 30.0–36.0)
MCV: 81 fL (ref 80.0–100.0)
Platelets: 263 10*3/uL (ref 150–400)
RBC: 3.79 MIL/uL — ABNORMAL LOW (ref 3.87–5.11)
RDW: 14.7 % (ref 11.5–15.5)
WBC: 17.3 10*3/uL — ABNORMAL HIGH (ref 4.0–10.5)
nRBC: 0 % (ref 0.0–0.2)

## 2023-06-10 LAB — BIRTH TISSUE RECOVERY COLLECTION (PLACENTA DONATION)

## 2023-06-10 MED ORDER — ACETAMINOPHEN 325 MG PO TABS: 650.0000 mg | ORAL_TABLET | Freq: Four times a day (QID) | ORAL | 1 refills | Status: AC | PRN

## 2023-06-10 MED ORDER — IBUPROFEN 600 MG PO TABS: 600.0000 mg | ORAL_TABLET | Freq: Four times a day (QID) | ORAL | 1 refills | Status: AC

## 2023-06-10 MED ORDER — ARIPIPRAZOLE ER 400 MG IM SRER
400.0000 mg | Freq: Once | INTRAMUSCULAR | Status: AC
  Administered 2023-06-10: 400 mg via INTRAMUSCULAR
  Filled 2023-06-10: qty 2

## 2023-06-10 MED ORDER — SENNOSIDES-DOCUSATE SODIUM 8.6-50 MG PO TABS: 2.0000 | ORAL_TABLET | Freq: Every evening | ORAL | 1 refills | Status: AC | PRN

## 2023-06-10 NOTE — Clinical Social Work Maternal (Addendum)
 CLINICAL SOCIAL WORK MATERNAL/CHILD NOTE  Patient Details  Name: Cheryl Cohen MRN: [id number] Date of Birth: [birth year]  Date:  06/10/2023  Clinical Social Worker Initiating Note:  Albertine Patricia, LCSWA Date/Time: Initiated:  06/10/23/1612     Child's Name:  Cheryl Cohen   Biological Parents:  Mother, Father (FOB: Demorris Shirlee Latch, age 32)   Need for Interpreter:  None   Reason for Referral:  Behavioral Health Concerns, Current Substance Use/Substance Use During Pregnancy     Address: Mailing Address: [address] [place] [address]   Current Address: [address] [place], [place] [address]   Phone number:  [phone] (home)     Additional phone number:   Household Members/Support Persons (HM/SP):   Household Member/Support Person 1   HM/SP Name Relationship DOB or Age  HM/SP -1 Arneshia Ade 11/02/2020  HM/SP -2        HM/SP -3        HM/SP -4        HM/SP -5        HM/SP -6        HM/SP -7        HM/SP -8          Natural Supports (not living in the home):  Parent, Spouse/significant other   Professional Supports: Other (Comment) (Psychiatrist, [facility]. Jannifer [facility] at [facility])   Employment: Unemployed   Type of Work:     Education:  Some Materials engineer arranged:    Surveyor, quantity Resources:  [employer]   Other Resources:  [employer], Sales executive     Cultural/Religious Considerations Which May Impact Care:  Per chart reviews, MOB identifies as Clinical research associate.  Strengths:  Ability to meet basic needs  , Home prepared for child  , Psychotropic Medications   Psychotropic Medications:  Abilify      Pediatrician:       Pediatrician List:   [facility]  [facility]      Pediatrician Fax Number:    Risk Factors/Current Problems:  Mental Health Concerns     Cognitive State:  Linear Thinking  , Able to Concentrate  ,  Alert  , Goal Oriented     Mood/Affect:  Euthymic  , Comfortable  , Relaxed  , Interested     CSW Assessment: CSW was consulted due to history of anxiety and bipolar disorder and maternal THC use. CSW met with MOB at bedside to complete assessment. When CSW entered room, MOB was observed laying in hospital bed. FOB was present asleep on couch. Infant was asleep on her back in bassinet. CSW introduced self and requested to speak with MOB alone. MOB provided verbal consent to complete consult with FOB present. CSW explained reason for consult. MOB presented oriented x4 with a euthymic mood and affect. MOB was agreeable to consult and remained engaged throughout encounter. MOB did not demonstrate any acute mental health signs/symptoms during consult.  CSW reviewed MOB's address and phone number on file. MOB reports the address listed in her chart is her mailing address and she currently lives at [address] [place] [address].   CSW inquired about MOB's mental health history. MOB acknowledged diagnoses of Bipolar Disorder with psychotic features and anxiety. MOB reports she was diagnosed with Bipolar Disorder in 2013, during her freshman year of college. MOB reports  she was diagnosed with anxiety in middle school. MOB acknowledged that she has been hospitalized inpatient on several occasions due to Bipolar disorder. MOB reports her last inpatient hospitalization was 09/2022, which is consistent with chart review. MOB reports "blacking out" and reports that she was endorsing auditory and visual hallucinations. MOB denied a history of command hallucinations. MOB reports feeling "good" and having a stable mood during pregnancy, stating the last time she experienced a manic episode was when she was hospitalized 09/2022. MOB denied endorsing AVH since 09/2022. CSW inquired about a history of postpartum depression/anxiety and/or postpartum psychosis, MOB denied a history of postpartum mental health concerns  after the birth of her other son. MOB reports she is followed by psychiatry and sees [facility]. MOB reports she receives monthly Abilify injections and was last given an Abilify injection today while inpatient. MOB reports she met with Integrated behavioral health therapist, Natasha Mead, [place] during her pregnancy, which she found helpful and expressed interest in future appointments. CSW to place referral. CSW inquired about coping skills. MOB identified listening to music, taking walks, dancing, and singing as coping skills. MOB identified her mother as a support and feels comfortable talking about mental health concerns with her. CSW assessed for safety. MOB denied current SI/HI.  CSW provided education regarding the baby blues period vs. perinatal mood disorders, discussed treatment and gave resources for mental health follow up if concerns arise.  CSW recommends self-evaluation during the postpartum time period using the New Mom Checklist from Postpartum Progress and encouraged MOB to contact a medical professional if symptoms are noted at any time.    MOB reports she has all needed items for infant except for a crib. CSW offered to bring MOB a pack n play to ensure MOB has a safe sleeping space for infant, MOB expressed appreciation. CSW delivered pack n play to room. MOB has chosen [facility] for infant's follow up care. CSW inquired about transportation barriers, MOB denied issues with transportation. CSW inquired about additional resource needs, MOB inquired about support with employment. CSW provided MOB with contact information to [facility]. MOB denied additional resource needs.  CSW informed MOB about hospital drug screen policy due to documented use of marijuana during pregnancy. CSW explained that infant's UDS was not collected; however infant's CDS would be monitored and a CPS report would be made if warranted. MOB  expressed understanding. CSW inquired about substance use during pregnancy. MOB denied substance use during pregnancy.   CSW provided review of Sudden Infant Death Syndrome (SIDS) precautions.    CSW identifies no further need for intervention and no barriers to discharge at this time.   CSW Plan/Description:  No Further Intervention Required/No Barriers to Discharge, Sudden Infant Death Syndrome (SIDS) Education, Perinatal Mood and Anxiety Disorder (PMADs) Education, Hospital Drug Screen Policy Information, CSW Will Continue to Monitor Umbilical Cord Tissue Drug Screen Results and Make Report if Reggie Pile, LCSWA 06/10/2023, 4:16 PM

## 2023-06-10 NOTE — Progress Notes (Signed)
 The Rn discussed with Connye Burkitt MD, the concerns the patient had about missing her mental health appointment and not receiving her injection medication for her bipolar . The MD stated she would talk with the patient and probably order a bipolar medication for her.

## 2023-06-10 NOTE — Anesthesia Postprocedure Evaluation (Signed)
 Anesthesia Post Note  Patient: Cheryl Cohen  Procedure(s) Performed: AN AD HOC LABOR EPIDURAL     Patient location during evaluation: Mother Baby Anesthesia Type: Epidural Level of consciousness: awake and alert Pain management: pain level controlled Vital Signs Assessment: post-procedure vital signs reviewed and stable Respiratory status: spontaneous breathing, nonlabored ventilation and respiratory function stable Cardiovascular status: stable Postop Assessment: no headache, no backache, epidural receding, no apparent nausea or vomiting, patient able to bend at knees, able to ambulate and adequate PO intake Anesthetic complications: no   No notable events documented.  Last Vitals:  Vitals:   06/09/23 2344 06/10/23 0404  BP:  103/71  Pulse: 81 72  Resp: 18 18  Temp: 36.6 C 36.6 C  SpO2: 98% 98%    Last Pain:  Vitals:   06/10/23 1338  TempSrc:   PainSc: 0-No pain   Pain Goal:                   [employer]

## 2023-06-11 ENCOUNTER — Other Ambulatory Visit: Payer: MEDICAID

## 2023-06-11 ENCOUNTER — Telehealth (HOSPITAL_COMMUNITY): Payer: Self-pay | Admitting: *Deleted

## 2023-06-11 ENCOUNTER — Encounter: Payer: 59 | Admitting: Obstetrics and Gynecology

## 2023-06-11 DIAGNOSIS — Z1331 Encounter for screening for depression: Secondary | ICD-10-CM

## 2023-06-11 NOTE — Telephone Encounter (Signed)
 During CSW assessment completed on 06/10/2023, patient requested IBH referral as she is newly postpartum and has a history of Bipolar Disorder with psychotic features. Spoke to Dr. Adrian Blackwater via [facility] about placing an order since patient's EPDS score was "0" in the hospital which is outside of this department's protocol for [facility] referral. Dr. Adrian Blackwater said that referral may be ordered.  Placed order for [facility] referral.    Salena Saner, RN 06/11/2023 13:01

## 2023-06-18 ENCOUNTER — Encounter: Payer: 59 | Admitting: Obstetrics and Gynecology

## 2023-06-19 ENCOUNTER — Telehealth (HOSPITAL_COMMUNITY): Payer: Self-pay

## 2023-06-19 NOTE — Telephone Encounter (Signed)
 06/19/2023 1922  Name: Cheryl Cohen MRN: [id number] DOB: March 20, 1991  Reason for Call:  Transition of Care Hospital Discharge Call  Contact Status: Patient Contact Status: Complete  Language assistant needed:          Follow-Up Questions: Do You Have Any Concerns About Your Health As You Heal From Delivery?: Yes What Concerns Do You Have About Your Health?: Patient asks how long postpartum healing takes. RN explained postpartum healing process and time frame for healing. RN also explained the importance of postpartum follow up appointment. Patient has no other questions or concerns. Do You Have Any Concerns About Your Infants Health?: No  Edinburgh Postnatal Depression Scale:  In the Past 7 Days: I have been able to laugh and see the funny side of things.: Not at all I have looked forward with enjoyment to things.: Rather less than I used to I have blamed myself unnecessarily when things went wrong.: No, never I have been anxious or worried for no good reason.: Yes, sometimes I have felt scared or panicky for no good reason.: No, not at all Things have been getting on top of me.: Yes, sometimes I haven't been coping as well as usual I have been so unhappy that I have had difficulty sleeping.: Not at all I have felt sad or miserable.: Not very often I have been so unhappy that I have been crying.: Only occasionally The thought of harming myself has occurred to me.: Never [name] Postnatal Depression Scale Total: (!) 10  RN told patient about Maternal Mental Health Resources ([facility], National Maternal Mental Health Hotline, Postpartum Support International, National Suicide and Crisis Lifeline). Also told patient about [facility] support group offerings. Will e-mail these resources to patient as well.  PHQ2-9 Depression Scale:     Discharge Follow-up: Edinburgh score requires follow up?: Yes (Patient already has an IBH referral in place. Patient states that she  went to counseling during pregnancy and plans to schedule an appointment.) Patient was advised of the following resources:: Breastfeeding Support Group, Support Group  Post-discharge interventions: Reviewed Newborn Safe Sleep Practices  Signature  [name]

## 2023-06-19 NOTE — Telephone Encounter (Signed)
 06/19/2023 1903  Name: Cheryl Cohen MRN: [id number] DOB: Dec 09, 1990  Reason for Call:  Transition of Care Hospital Discharge Call  Contact Status: Patient Contact Status: Message  Language assistant needed:          Follow-Up Questions:    [name] Postnatal Depression Scale:  In the Past 7 Days:    PHQ2-9 Depression Scale:     Discharge Follow-up:    Post-discharge interventions: NA  Signature  [name]

## 2023-07-02 ENCOUNTER — Ambulatory Visit

## 2023-07-17 NOTE — BH Specialist Note (Signed)
 Integrated Behavioral Health via Telemedicine Visit  07/31/2023 Cheryl Cohen [id number]  Number of Integrated Behavioral Health Clinician visits: 1- Initial Visit  Session Start time: 0845   Session End time: 0859  Total time in minutes: 14   Referring Provider: Werner Hamlet, DO Cheryl/Family location: Home [facility] Provider location: [facility]  All persons participating in visit: Cheryl Cohen and [name]   Types of Service: Individual psychotherapy and Video visit  I connected with [name] and/or [name]'s  n/a  via  Telephone or Video Enabled Telemedicine Application  (Video is Caregility application) and verified that I am speaking with the correct person using two identifiers. Discussed confidentiality: Yes   I discussed the limitations of telemedicine and the availability of in person appointments.  Discussed there is a possibility of technology failure and discussed alternative modes of communication if that failure occurs.  I discussed that engaging in this telemedicine visit, they consent to the provision of behavioral healthcare and the services will be billed under their insurance.  Cheryl and/or legal guardian expressed understanding and consented to Telemedicine visit: Yes   Presenting Concerns: Cheryl and/or family reports the following symptoms/concerns: Pt states that early postpartum she felt overwhelmed and had crying spells; mood improvement after first two weeks postpartum and starting back on Abilify . Pt is sleeping and eating well, has good support; is established with psychiatrist, Dr. Akintayo; no other questions or concerns.  Duration of problem: Ongoing; Severity of problem: mild  Cheryl and/or Family's Strengths/Protective Factors: Social connections, Concrete supports in place (healthy food, safe environments, etc.), Sense of purpose, and Physical Health  (exercise, healthy diet, medication compliance, etc.)  Goals Addressed: Cheryl will:  Maintain reduced symptoms of: mood instability    Demonstrate ability to: Increase healthy adjustment to current life circumstances and Increase adequate support systems for Cheryl/family  Progress towards Goals: Ongoing  Interventions: Interventions utilized:  Functional Assessment of ADLs and Psychoeducation and/or Health Education Standardized Assessments completed: GAD-7 and PHQ 9  Cheryl and/or Family Response: Cheryl agrees with treatment plan.   Assessment: Cheryl currently experiencing Bipolar 1 disorder (as previously diagnosed with psychiatry).   Cheryl may benefit from psychoeducation and brief therapeutic interventions regarding maintaining reduced symptoms of mood instability .  Plan: Follow up with behavioral health clinician on : Call Jeancarlo Leffler at [phone], as needed. Behavioral recommendations:  -Continue taking Abilify  as prescribed; continue seeing psychiatrist -Continue prioritizing healthy self-care (regular meals, adequate rest; allowing practical help from supportive friends and family) until at least postpartum medical appointment -Consider new mom support group as needed at either www.[email or web address].net or [email or web address]   Referral(s): Community Resources:  new mom support  I discussed the assessment and treatment plan with the Cheryl and/or parent/guardian. They were provided an opportunity to ask questions and all were answered. They agreed with the plan and demonstrated an understanding of the instructions.   They were advised to call back or seek an in-person evaluation if the symptoms worsen or if the condition fails to improve as anticipated.  Georgia Kipper, LCSW     07/31/2023    8:50 AM 06/04/2023    1:51 PM 03/29/2023    8:45 AM 11/21/2022   11:00 AM 05/24/2022   11:33 AM  Depression screen PHQ 2/9  Decreased Interest 0 0 0 0 0  Down,  Depressed, Hopeless 0 0 0 0 0  PHQ - 2 Score 0 0 0 0 0  Altered sleeping 0 0 0 0 0  Tired, decreased energy 0 0 1 0 0  Change in appetite 0 0 1 0 0  Feeling bad or failure about yourself  0 0 0 0 0  Trouble concentrating 0 0 0 0 0  Moving slowly or fidgety/restless 0 0 1 0 0  Suicidal thoughts 0 0 0 0 0  PHQ-9 Score 0 0 3 0 0  Difficult doing work/chores  Not difficult at all   Not difficult at all      07/31/2023    8:52 AM 06/04/2023    1:52 PM 03/29/2023    8:47 AM 11/21/2022   11:05 AM  GAD 7 : Generalized Anxiety Score  Nervous, Anxious, on Edge 0 0 1 0  Control/stop worrying 0 0 0 0  Worry too much - different things 0 0 0 0  Trouble relaxing 0 0 0 0  Restless 0 0 0 0  Easily annoyed or irritable 0 1 0 0  Afraid - awful might happen 0 0 0 0  Total GAD 7 Score 0 1 1 0  Anxiety Difficulty  Not difficult at all

## 2023-07-20 ENCOUNTER — Encounter

## 2023-07-24 ENCOUNTER — Ambulatory Visit: Admitting: Obstetrics & Gynecology

## 2023-07-31 ENCOUNTER — Ambulatory Visit: Payer: MEDICAID | Admitting: Clinical

## 2023-07-31 DIAGNOSIS — F319 Bipolar disorder, unspecified: Secondary | ICD-10-CM

## 2023-07-31 NOTE — Patient Instructions (Signed)
 [facility] at [facility] [address] [place], [place] [address] [phone] (main office) [phone] [facility] office)  New Parent Support Groups [email or web address].net [email or web address]  [place] [employer]  (Childcare options, Early childcare development, etc.) [email or web address]  [employer] Child Care Facility Search Engine  [email or web address]

## 2023-09-25 ENCOUNTER — Ambulatory Visit: Payer: MEDICAID | Admitting: Obstetrics

## 2023-10-03 ENCOUNTER — Other Ambulatory Visit (HOSPITAL_COMMUNITY)
Admission: RE | Admit: 2023-10-03 | Discharge: 2023-10-03 | Disposition: A | Discharge status: Home / Self Care | Payer: MEDICAID | Source: Ambulatory Visit | Attending: Family Medicine | Admitting: Family Medicine

## 2023-10-03 ENCOUNTER — Ambulatory Visit: Payer: Self-pay | Admitting: Family Medicine

## 2023-10-03 ENCOUNTER — Encounter: Payer: Self-pay | Admitting: Family Medicine

## 2023-10-03 ENCOUNTER — Ambulatory Visit: Payer: MEDICAID | Admitting: Family Medicine

## 2023-10-03 DIAGNOSIS — R8290 Unspecified abnormal findings in urine: Secondary | ICD-10-CM | POA: Diagnosis not present

## 2023-10-03 DIAGNOSIS — Z708 Other sex counseling: Secondary | ICD-10-CM | POA: Insufficient documentation

## 2023-10-03 DIAGNOSIS — N3001 Acute cystitis with hematuria: Secondary | ICD-10-CM

## 2023-10-03 LAB — POCT URINALYSIS DIPSTICK
Bilirubin, UA: NEGATIVE
Glucose, UA: NEGATIVE
Ketones, UA: NEGATIVE
Leukocytes, UA: NEGATIVE
Nitrite, UA: POSITIVE
Protein, UA: NEGATIVE
Spec Grav, UA: 1.02 (ref 1.010–1.025)
Urobilinogen, UA: 0.2 U/dL
pH, UA: 6 (ref 5.0–8.0)

## 2023-10-03 MED ORDER — NITROFURANTOIN MONOHYD MACRO 100 MG PO CAPS: 100.0000 mg | ORAL_CAPSULE | Freq: Two times a day (BID) | ORAL | 0 refills | Status: AC

## 2023-10-03 NOTE — Progress Notes (Signed)
 Post Partum Visit Note  Cheryl Cohen is a 33 y.o. [phone] female who presents for a postpartum visit. She is 16 weeks postpartum following a normal spontaneous vaginal delivery.  I have fully reviewed the prenatal and intrapartum course. The delivery was at 39 gestational weeks.  Anesthesia: epidural. Postpartum course has been good. Baby is doing well. Baby is feeding by bottle - Similac total comfort. Bleeding no bleeding. Bowel function is normal. Bladder function is normal. Patient is sexually active. Contraception method is IUD. Postpartum depression screening: negative.   The pregnancy intention screening data noted above was reviewed. Potential methods of contraception were discussed. The patient elected to proceed with No data recorded.   Edinburgh Postnatal Depression Scale - 10/03/23 1136       Edinburgh Postnatal Depression Scale:  In the Past 7 Days   I have been able to laugh and see the funny side of things. 0    I have looked forward with enjoyment to things. 0    I have blamed myself unnecessarily when things went wrong. 0    I have been anxious or worried for no good reason. 0    I have felt scared or panicky for no good reason. 0    Things have been getting on top of me. 0    I have been so unhappy that I have had difficulty sleeping. 0    I have felt sad or miserable. 0    I have been so unhappy that I have been crying. 0    The thought of harming myself has occurred to me. 0    Edinburgh Postnatal Depression Scale Total 0          Health Maintenance Due  Topic Date Due   Hepatitis B Vaccines (1 of 3 - 19+ 3-dose series) Never done   HPV VACCINES (1 - 3-dose SCDM series) Never done   COVID-19 Vaccine (3 - 2024-25 season) 12/03/2022    The following portions of the patient's history were reviewed and updated as appropriate: allergies, current medications, past family history, past medical history, past social history, past surgical history, and problem  list.  Review of Systems Pertinent items are noted in HPI.  Objective:  BP (!) 88/60   Pulse 76   Ht 5' 8 (1.727 m)   Wt 160 lb (72.6 kg)   LMP  (LMP Unknown)   Breastfeeding No   BMI 24.33 kg/m    General:  alert and cooperative   Breasts:  not indicated  Lungs: Breathing comfortably  Heart:  Regular rate  Abdomen: normal findings: nontender   GU exam:  Speculum exam performed in presence of chaperone. IUD strings visualized at os, trimmed to 3cm.       Assessment:    There are no diagnoses linked to this encounter.  Normal postpartum exam.   Plan:   Essential components of care per ACOG recommendations:  1.  Mood and well being: Patient with negative depression screening today. Reviewed local resources for support.  - Patient tobacco use? No.   - hx of drug use? No.    2. Infant care and feeding:  -Patient currently breastmilk feeding? No.  -Social determinants of health (SDOH) reviewed in EPIC. No concerns  3. Sexuality, contraception and birth spacing - Patient does not want a pregnancy in the next year.  - Reviewed reproductive life planning. Reviewed contraceptive methods based on pt preferences and effectiveness.  Patient desired s/p postplacental IUD placement  today.   - Discussed birth spacing of 18 months  4. Sleep and fatigue -Encouraged family/partner/community support of 4 hrs of uninterrupted sleep to help with mood and fatigue  5. Physical Recovery  - Discussed patients delivery and complications. She describes her labor as good. - Patient had a Vaginal, no problems at delivery. Patient had no lacerations. Perineal healing reviewed. Patient expressed understanding - Patient has urinary incontinence? No. - Patient is safe to resume physical and sexual activity  6.  Health Maintenance - HM due items addressed No - pap UTD - Last pap smear  Diagnosis  Date Value Ref Range Status  05/24/2022   Final   - Negative for intraepithelial lesion or  malignancy (NILM)   Pap smear not done at today's visit.  -Breast Cancer screening indicated? No.   7. Chronic Disease/Pregnancy Condition follow up: None  8. Malodorous urine: UA ordered  - PCP follow up  Alain Sor, MD Center for [facility], [facility]

## 2023-10-03 NOTE — Progress Notes (Signed)
 Pt presents for PP visit. Reports fishy odor and is requesting UTi and STI testing. C/O right sided abdominal pain for a few weeks.

## 2023-10-07 LAB — URINE CULTURE

## 2023-10-08 LAB — CERVICOVAGINAL ANCILLARY ONLY
Chlamydia: NEGATIVE
Comment: NEGATIVE
Comment: NEGATIVE
Comment: NORMAL
Neisseria Gonorrhea: NEGATIVE
Trichomonas: NEGATIVE

## 2023-12-15 ENCOUNTER — Ambulatory Visit (HOSPITAL_COMMUNITY)
Admission: EM | Admit: 2023-12-15 | Discharge: 2023-12-15 | Disposition: A | Discharge status: Home / Self Care | Payer: MEDICAID

## 2023-12-15 ENCOUNTER — Encounter (HOSPITAL_COMMUNITY): Payer: Self-pay | Admitting: Emergency Medicine

## 2023-12-15 DIAGNOSIS — R112 Nausea with vomiting, unspecified: Secondary | ICD-10-CM | POA: Diagnosis not present

## 2023-12-15 DIAGNOSIS — R197 Diarrhea, unspecified: Secondary | ICD-10-CM

## 2023-12-15 DIAGNOSIS — B349 Viral infection, unspecified: Secondary | ICD-10-CM

## 2023-12-15 MED ORDER — ONDANSETRON 4 MG PO TBDP: 4.0000 mg | ORAL_TABLET | Freq: Three times a day (TID) | ORAL | 0 refills | Status: AC | PRN

## 2023-12-15 NOTE — ED Provider Notes (Signed)
 [facility]    CSN: [id number] Arrival date & time: 12/15/23  1708      History   Chief Complaint Chief Complaint  Patient presents with   Diarrhea   Emesis    HPI Cheryl Cohen is a 33 y.o. female.   Patient presents with nausea, vomiting, and diarrhea that began this morning.  Patient states that she has had 5 episodes of vomiting today and about 2 episodes of diarrhea today.  Patient denies abdominal pain, fever, blood in vomit or stool, chest pain, shortness of breath cough, and congestion.  Patient denies any known sick exposures.  Patient denies taking any medication for symptoms.  The history is provided by the patient and medical records.  Diarrhea Associated symptoms: vomiting   Emesis Associated symptoms: diarrhea     Past Medical History:  Diagnosis Date   Anxiety    Bipolar 1 disorder (HCC)    hospitalized  3 x onset rx in 9th grade    Family history of diabetes mellitus type II 01/11/2012   Gestational diabetes    Gonorrhea    Hx of varicella    Psychotic disorder with delusions (HCC)    Suicidal ideation 10/06/2013   Syphilis    Twin pregnancy, delivered vaginally, IUFD stillborns 09/09/2015   IUFD  22 + weeks  still borns     Patient Active Problem List   Diagnosis Date Noted   Encounter for induction of labor 06/09/2023   NSVD (normal spontaneous vaginal delivery) 06/09/2023   GBS (group B Streptococcus carrier), +RV culture, currently pregnant 05/28/2023   Anemia during pregnancy in third trimester 05/07/2023   History of gestational diabetes mellitus (GDM) 12/07/2022   History of IUFD 11/21/2022   Supervision of high risk pregnancy, antepartum 11/21/2022   Delta-9-tetrahydrocannabinol (THC) dependence (HCC) 07/19/2022   Generalized anxiety disorder 07/19/2022   Bipolar affective disorder, mixed, severe, with psychotic behavior (HCC) 07/18/2022    Past Surgical History:  Procedure Laterality Date   DILATION AND EVACUATION  N/A 09/09/2015   Procedure: DILATATION AND EVACUATION;  Surgeon: Krystal Deaner, MD;  Location: [facility];  Service: Gynecology;  Laterality: N/A;   TYMPANOSTOMY TUBE PLACEMENT     asage 2-3 yrs    OB History     Gravida  7   Para  4   Term  2   Preterm  2   AB  3   Living  2      SAB  0   IAB  3   Ectopic  0   Multiple  1   Live Births  2            Home Medications    Prior to Admission medications   Medication Sig Start Date End Date Taking? Authorizing Provider  lithium  carbonate (ESKALITH ) 450 MG ER tablet Take 450 mg by mouth at bedtime. 11/14/23  Yes [provider]  ondansetron  (ZOFRAN -ODT) 4 MG disintegrating tablet Take 1 tablet (4 mg total) by mouth every 8 (eight) hours as needed for nausea or vomiting. 12/15/23  Yes Johnie Flaming A, NP  acetaminophen  (TYLENOL ) 325 MG tablet Take 2 tablets (650 mg total) by mouth every 6 (six) hours as needed (for pain scale < 4). 06/10/23   Nicholaus Almarie HERO, MD  ARIPiprazole  ER (ABILIFY  MAINTENA) 400 MG SRER injection Inject 2 mLs (400 mg total) into the muscle every 28 (twenty-eight) days. (Due on 10-13-22): For mood stability 10/13/22   Collene Mac FERNS, NP  ibuprofen  (  ADVIL ) 600 MG tablet Take 1 tablet (600 mg total) by mouth every 6 (six) hours. 06/10/23   Nicholaus Almarie HERO, MD  nitrofurantoin , macrocrystal-monohydrate, (MACROBID ) 100 MG capsule Take 1 capsule (100 mg total) by mouth 2 (two) times daily. 10/03/23   Kumar, Agnijita, MD  Prenatal Vit-Fe Fumarate-FA (PRENATAL VITAMIN PO) Take 1 tablet by mouth daily.    [provider]  senna-docusate (SENOKOT-S) 8.6-50 MG tablet Take 2 tablets by mouth at bedtime as needed for mild constipation. 06/10/23   Nicholaus Almarie HERO, MD    Family History Family History  Problem Relation Age of Onset   Diabetes Mother    Arthritis Mother    Stroke Father    Hypertension Father    Alcohol  abuse Father    Alcoholism Father     Social History Social History    Tobacco Use   Smoking status: Former    Types: Cigars   Smokeless tobacco: Former   Tobacco comments:    1 cigarette a day  Advertising account planner   Vaping status: Never Used  Substance Use Topics   Alcohol  use: Not Currently   Drug use: Not Currently    Types: Marijuana    Comment: Quit a few months ago     Allergies   Amoxicillin    Review of Systems Review of Systems  Gastrointestinal:  Positive for diarrhea and vomiting.   Per HPI  Physical Exam Triage Vital Signs ED Triage Vitals [12/15/23 1811]  Encounter Vitals Group     BP 99/69     Girls Systolic BP Percentile      Girls Diastolic BP Percentile      Boys Systolic BP Percentile      Boys Diastolic BP Percentile      Pulse Rate 91     Resp 16     Temp 97.8 F (36.6 C)     Temp Source Oral     SpO2 96 %     Weight      Height      Head Circumference      Peak Flow      Pain Score 0     Pain Loc      Pain Education      Exclude from Growth Chart    No data found.  Updated Vital Signs BP 99/69 (BP Location: Right Arm)   Pulse 91   Temp 97.8 F (36.6 C) (Oral)   Resp 16   LMP 12/10/2023 (Exact Date)   SpO2 96%   Visual Acuity Right Eye Distance:   Left Eye Distance:   Bilateral Distance:    Right Eye Near:   Left Eye Near:    Bilateral Near:     Physical Exam Vitals and nursing note reviewed.  Constitutional:      General: She is awake. She is not in acute distress.    Appearance: Normal appearance. She is well-developed and well-groomed. She is not ill-appearing.  Cardiovascular:     Rate and Rhythm: Normal rate and regular rhythm.  Pulmonary:     Effort: Pulmonary effort is normal.     Breath sounds: Normal breath sounds.  Abdominal:     General: Abdomen is flat. Bowel sounds are normal. There is no distension.     Palpations: Abdomen is soft.     Tenderness: There is no abdominal tenderness. There is no guarding or rebound.  Skin:    General: Skin is warm and dry.  Neurological:  Mental Status: She is alert.  Psychiatric:        Behavior: Behavior is cooperative.      UC Treatments / Results  Labs (all labs ordered are listed, but only abnormal results are displayed) Labs Reviewed - No data to display  EKG   Radiology No results found.  Procedures Procedures (including critical care time)  Medications Ordered in UC Medications - No data to display  Initial Impression / Assessment and Plan / UC Course  I have reviewed the triage vital signs and the nursing notes.  Pertinent labs & imaging results that were available during my care of the patient were reviewed by me and considered in my medical decision making (see chart for details).     Patient is overall well-appearing.  Vitals are stable.  Abdomen is flat, soft, and nontender.  Bowel sounds are normal.  No significant findings on exam.  Symptoms likely viral in nature.  Prescribed Zofran  as needed for nausea and vomiting.  Discussed over-the-counter medication as needed for symptoms.  Discussed importance of hydration.  Discussed follow-up, return, and strict ER precautions. Final Clinical Impressions(s) / UC Diagnoses   Final diagnoses:  Nausea vomiting and diarrhea  Viral illness     Discharge Instructions      As discussed I believe your symptoms are likely related to a viral illness. You can take Zofran  every 8 hours as needed for nausea and vomiting.  This will dissolve under your tongue. You can take over-the-counter Imodium  as needed for diarrhea. Make sure you are staying well-hydrated and getting plenty of rest. Follow-up with your primary care provider or return here as needed. If you develop worsening abdominal pain, excessive vomiting, persistent fevers, weakness, or passing out please seek immediate medical treatment in the emergency department.   ED Prescriptions     Medication Sig Dispense Auth. Provider   ondansetron  (ZOFRAN -ODT) 4 MG disintegrating tablet Take 1 tablet  (4 mg total) by mouth every 8 (eight) hours as needed for nausea or vomiting. 10 tablet Johnie Flaming A, NP      PDMP not reviewed this encounter.   Johnie Flaming A, NP 12/15/23 [phone]

## 2023-12-15 NOTE — Discharge Instructions (Signed)
 As discussed I believe your symptoms are likely related to a viral illness. You can take Zofran  every 8 hours as needed for nausea and vomiting.  This will dissolve under your tongue. You can take over-the-counter Imodium  as needed for diarrhea. Make sure you are staying well-hydrated and getting plenty of rest. Follow-up with your primary care provider or return here as needed. If you develop worsening abdominal pain, excessive vomiting, persistent fevers, weakness, or passing out please seek immediate medical treatment in the emergency department.

## 2023-12-15 NOTE — ED Triage Notes (Signed)
 Patient reports that since 3am she has had n/v/d. Denies any pain. Denies taking any medications for her symptoms.

## 2024-02-27 ENCOUNTER — Ambulatory Visit (INDEPENDENT_AMBULATORY_CARE_PROVIDER_SITE_OTHER): Payer: MEDICAID

## 2024-02-27 ENCOUNTER — Other Ambulatory Visit (HOSPITAL_COMMUNITY)
Admission: RE | Admit: 2024-02-27 | Discharge: 2024-02-27 | Disposition: A | Discharge status: Home / Self Care | Payer: MEDICAID | Source: Ambulatory Visit | Attending: Obstetrics and Gynecology | Admitting: Obstetrics and Gynecology

## 2024-02-27 VITALS — BP 106/67 | HR 67 | Wt 154.9 lb

## 2024-02-27 DIAGNOSIS — N898 Other specified noninflammatory disorders of vagina: Secondary | ICD-10-CM

## 2024-02-27 NOTE — Progress Notes (Signed)
..  SUBJECTIVE:  33 y.o. female complains of vaginal discharge for 1 week(s). Denies abnormal vaginal bleeding or fever.  Complains of occasional right sided pain/cramping No UTI symptoms. Denies history of known exposure to STD.  Patient's last menstrual period was 02/18/2024. Has IUD in place  OBJECTIVE:  She appears well, afebrile. Urine dipstick: not done.  ASSESSMENT:  Vaginal Discharge  Vaginal Odor   PLAN:  GC, chlamydia, trichomonas, BVAG, CVAG probe sent to lab. Treatment: To be determined once lab results are received ROV prn if symptoms persist or worsen. Advised that cramping could be related to potential infection of IUD, advised to follow up with provider if it does not improve.

## 2024-02-28 LAB — HEPATITIS B SURFACE ANTIGEN: Hepatitis B Surface Ag: NEGATIVE

## 2024-02-28 LAB — HEPATITIS C ANTIBODY: Hep C Virus Ab: NONREACTIVE

## 2024-02-28 LAB — SYPHILIS: RPR W/REFLEX TO RPR TITER AND TREPONEMAL ANTIBODIES, TRADITIONAL SCREENING AND DIAGNOSIS ALGORITHM: RPR Ser Ql: NONREACTIVE

## 2024-02-28 LAB — HIV ANTIBODY (ROUTINE TESTING W REFLEX): HIV Screen 4th Generation wRfx: NONREACTIVE

## 2024-02-29 LAB — CERVICOVAGINAL ANCILLARY ONLY
Bacterial Vaginitis (gardnerella): POSITIVE — AB
Candida Glabrata: NEGATIVE
Candida Vaginitis: POSITIVE — AB
Chlamydia: NEGATIVE
Comment: NEGATIVE
Comment: NEGATIVE
Comment: NEGATIVE
Comment: NEGATIVE
Comment: NEGATIVE
Comment: NORMAL
Neisseria Gonorrhea: NEGATIVE
Trichomonas: NEGATIVE

## 2024-03-01 ENCOUNTER — Ambulatory Visit: Payer: Self-pay | Admitting: Obstetrics and Gynecology

## 2024-03-01 MED ORDER — FLUCONAZOLE 150 MG PO TABS: 150.0000 mg | ORAL_TABLET | Freq: Once | ORAL | 0 refills | Status: AC

## 2024-03-01 MED ORDER — METRONIDAZOLE 500 MG PO TABS: 500.0000 mg | ORAL_TABLET | Freq: Two times a day (BID) | ORAL | 0 refills | Status: AC

## 2024-03-30 ENCOUNTER — Emergency Department (HOSPITAL_COMMUNITY)
Admission: EM | Admit: 2024-03-30 | Discharge: 2024-03-30 | Disposition: A | Discharge status: Home / Self Care | Payer: MEDICAID | Attending: Emergency Medicine | Admitting: Emergency Medicine

## 2024-03-30 ENCOUNTER — Encounter (HOSPITAL_COMMUNITY): Payer: Self-pay

## 2024-03-30 ENCOUNTER — Other Ambulatory Visit: Payer: Self-pay

## 2024-03-30 ENCOUNTER — Emergency Department (HOSPITAL_COMMUNITY): Payer: MEDICAID

## 2024-03-30 DIAGNOSIS — N760 Acute vaginitis: Secondary | ICD-10-CM | POA: Diagnosis not present

## 2024-03-30 DIAGNOSIS — N12 Tubulo-interstitial nephritis, not specified as acute or chronic: Secondary | ICD-10-CM | POA: Insufficient documentation

## 2024-03-30 DIAGNOSIS — Z87891 Personal history of nicotine dependence: Secondary | ICD-10-CM | POA: Insufficient documentation

## 2024-03-30 DIAGNOSIS — B9689 Other specified bacterial agents as the cause of diseases classified elsewhere: Secondary | ICD-10-CM

## 2024-03-30 DIAGNOSIS — R10A2 Flank pain, left side: Secondary | ICD-10-CM | POA: Diagnosis present

## 2024-03-30 LAB — CBC WITH DIFFERENTIAL/PLATELET
Abs Immature Granulocytes: 0.03 K/uL (ref 0.00–0.07)
Basophils Absolute: 0 K/uL (ref 0.0–0.1)
Basophils Relative: 0 %
Eosinophils Absolute: 0.1 K/uL (ref 0.0–0.5)
Eosinophils Relative: 1 %
HCT: 40.6 % (ref 36.0–46.0)
Hemoglobin: 13.8 g/dL (ref 12.0–15.0)
Immature Granulocytes: 0 %
Lymphocytes Relative: 15 %
Lymphs Abs: 1.5 K/uL (ref 0.7–4.0)
MCH: 31.2 pg (ref 26.0–34.0)
MCHC: 34 g/dL (ref 30.0–36.0)
MCV: 91.9 fL (ref 80.0–100.0)
Monocytes Absolute: 1.6 K/uL — ABNORMAL HIGH (ref 0.1–1.0)
Monocytes Relative: 17 %
Neutro Abs: 6.5 K/uL (ref 1.7–7.7)
Neutrophils Relative %: 67 %
Platelets: 272 K/uL (ref 150–400)
RBC: 4.42 MIL/uL (ref 3.87–5.11)
RDW: 12.6 % (ref 11.5–15.5)
WBC: 9.8 K/uL (ref 4.0–10.5)
nRBC: 0 % (ref 0.0–0.2)

## 2024-03-30 LAB — COMPREHENSIVE METABOLIC PANEL WITH GFR
ALT: 6 U/L (ref 0–44)
AST: 16 U/L (ref 15–41)
Albumin: 4.2 g/dL (ref 3.5–5.0)
Alkaline Phosphatase: 60 U/L (ref 38–126)
Anion gap: 11 (ref 5–15)
BUN: 7 mg/dL (ref 6–20)
CO2: 24 mmol/L (ref 22–32)
Calcium: 9.2 mg/dL (ref 8.9–10.3)
Chloride: 101 mmol/L (ref 98–111)
Creatinine, Ser: 0.68 mg/dL (ref 0.44–1.00)
GFR, Estimated: >60 mL/min
Glucose, Bld: 96 mg/dL (ref 70–99)
Potassium: 3.8 mmol/L (ref 3.5–5.1)
Sodium: 136 mmol/L (ref 135–145)
Total Bilirubin: 0.5 mg/dL (ref 0.0–1.2)
Total Protein: 7.5 g/dL (ref 6.5–8.1)

## 2024-03-30 LAB — URINALYSIS, ROUTINE W REFLEX MICROSCOPIC
Bilirubin Urine: NEGATIVE
Glucose, UA: NEGATIVE mg/dL
Ketones, ur: NEGATIVE mg/dL
Nitrite: POSITIVE — AB
Protein, ur: 100 mg/dL — AB
Specific Gravity, Urine: 1.016 (ref 1.005–1.030)
WBC, UA: >50 WBC/hpf (ref 0–5)
pH: 5 (ref 5.0–8.0)

## 2024-03-30 LAB — WET PREP, GENITAL
Sperm: NONE SEEN
Trich, Wet Prep: NONE SEEN
WBC, Wet Prep HPF POC: >=10 — AB
Yeast Wet Prep HPF POC: NONE SEEN

## 2024-03-30 LAB — LIPASE, BLOOD: Lipase: 20 U/L (ref 11–51)

## 2024-03-30 LAB — I-STAT CHEM 8, ED
BUN: 6 mg/dL (ref 6–20)
Calcium, Ion: 1.14 mmol/L — ABNORMAL LOW (ref 1.15–1.40)
Chloride: 102 mmol/L (ref 98–111)
Creatinine, Ser: 0.7 mg/dL (ref 0.44–1.00)
Glucose, Bld: 97 mg/dL (ref 70–99)
HCT: 41 % (ref 36.0–46.0)
Hemoglobin: 13.9 g/dL (ref 12.0–15.0)
Potassium: 3.7 mmol/L (ref 3.5–5.1)
Sodium: 138 mmol/L (ref 135–145)
TCO2: 24 mmol/L (ref 22–32)

## 2024-03-30 LAB — HCG, SERUM, QUALITATIVE: Preg, Serum: NEGATIVE

## 2024-03-30 MED ORDER — METRONIDAZOLE 500 MG PO TABS: 500.0000 mg | ORAL_TABLET | Freq: Two times a day (BID) | ORAL | 0 refills | Status: AC

## 2024-03-30 MED ORDER — CEFUROXIME AXETIL 500 MG PO TABS: 500.0000 mg | ORAL_TABLET | Freq: Two times a day (BID) | ORAL | 0 refills | Status: AC

## 2024-03-30 MED ORDER — KETOROLAC TROMETHAMINE 15 MG/ML IJ SOLN
15.0000 mg | Freq: Once | INTRAMUSCULAR | Status: AC
  Administered 2024-03-30: 15 mg via INTRAVENOUS
  Filled 2024-03-30: qty 1

## 2024-03-30 MED ORDER — IOHEXOL 350 MG/ML SOLN
75.0000 mL | Freq: Once | INTRAVENOUS | Status: AC | PRN
  Administered 2024-03-30: 75 mL via INTRAVENOUS

## 2024-03-30 NOTE — ED Provider Notes (Signed)
 " [facility] Provider Note   HPI/ROS    History obtained from patient.  Cheryl Cohen is a 33 y.o. female who presents for Abdominal Pain, Chills, and Dizziness and who  has a past medical history of Anxiety, Bipolar 1 disorder (HCC), Family history of diabetes mellitus type II (01/11/2012), Gestational diabetes, Gonorrhea, varicella, Psychotic disorder with delusions (HCC), Suicidal ideation (10/06/2013), Syphilis, and Twin pregnancy, delivered vaginally, IUFD stillborns (09/09/2015).  Patient presents today for left flank pain/left lower quadrant pain that has been going on for 3 to 4 days.  Also endorses increased urinary frequency and foul-smelling urine.  Also has foul-smelling vaginal discharge.  She is not concerned about STDs currently concerned about STDs, but states her vaginal discharge had a fishy smell.  Had very significant pain last night that was causing her to have difficulty sleeping.  Endorses chills at home without any fevers.  Denies any vomiting or diarrhea, but does endorse nausea.  MDM   I have reviewed the nursing documentation, vital signs, as well as the past medical history, surgical history, family history, and social history.  Initial Assessment:  Hemodynamically stable on initial evaluation.  Labs obtained in first look with no significant AKI, electrolyte abnormality, or transaminitis.  No significant leukocytosis, anemia, or thrombocytopenia.  Pregnancy test negative.  UA here positive for nitrates and leukocyte esterase.  CT findings consistent with pyelonephritis.  This to be consistent with her left-sided flank pain.  Will obtain wet prep and GC given patient's malodorous discharge.  She currently is not concerned she has STDs.  GC pending at this time and patient not very concerned about STDs, so we will hold off on prophylactic treatment.  Will be discharged home with cefuroxime  and Flagyl  for pyelonephritis and  BV  respectively.  Patient otherwise has no signs of peritonitis, significant abdominal pain, or hemodynamic instability at this time.  Did have a fever here, but improved after Toradol .  Not high risk or immunocompromise requiring admission for pyelonephritis.  Patient discharged home in hemodynamically stable condition with strict return precautions.  She plans follow-up with her PCP in 2 weeks for repeat UA and wet prep.  Disposition:  I discussed the plan for discharge with the patient and/or their surrogate at bedside prior to discharge and they were in agreement with the plan and verbalized understanding of the return precautions provided. All questions answered to the best of my ability. Ultimately, the patient was discharged in stable condition with stable vital signs. I am reassured that they are capable of close follow up and good social support at home.     This patient was staffed with Dr. Francesca who supervised the visit and agreed with the plan of care.   Due to the patients current presenting symptoms, physical exam findings, and the workup stated above, it is thought that the etiology of the patients current presentation is:  1. Pyelonephritis   2. BV (bacterial vaginosis)      Clinical Complexity A medically appropriate history, review of systems, and physical exam was performed.  Factors that affect the complexity of this encounter: assessment of correct protocol and laboratory work from this visit  My independent interpretations of diagnostic studies are documented in the ED course above.   If decision rules were used in this patient's evaluation, they are listed below.   Click here for ABCD2, HEART and other calculators  Patient's presentation is most consistent with acute illness / injury with  system symptoms.  MDM generated using voice dictation software and may contain dictation errors. Please contact me for any clarification or with any questions.    Physical  Exam, PMH, PSH, Family History, and Social Hsitory   Vitals:   03/30/24 1442 03/30/24 1758 03/30/24 2034 03/30/24 2226  BP: (!) 90/58 (!) 100/59 98/64   Pulse: 79 97 95   Resp: 14 15 18    Temp: 98.1 F (36.7 C) 99.8 F (37.7 C) (!) 100.4 F (38 C) 100.3 F (37.9 C)  TempSrc:   Oral Oral  SpO2: 98% 100% 100%     Physical Exam Constitutional:      Appearance: She is well-developed.  HENT:     Head: Normocephalic and atraumatic.  Cardiovascular:     Rate and Rhythm: Normal rate and regular rhythm.  Pulmonary:     Effort: Pulmonary effort is normal.     Breath sounds: No wheezing or rales.  Abdominal:     General: Abdomen is flat.     Palpations: Abdomen is soft.     Tenderness: There is abdominal tenderness in the suprapubic area. There is left CVA tenderness. There is no guarding or rebound.  Skin:    General: Skin is warm and dry.     Capillary Refill: Capillary refill takes less than 2 seconds.  Neurological:     General: No focal deficit present.     Mental Status: She is alert and oriented to person, place, and time.     Past Medical History:  Diagnosis Date   Anxiety    Bipolar 1 disorder (HCC)    hospitalized  3 x onset rx in 9th grade    Family history of diabetes mellitus type II 01/11/2012   Gestational diabetes    Gonorrhea    Hx of varicella    Psychotic disorder with delusions (HCC)    Suicidal ideation 10/06/2013   Syphilis    Twin pregnancy, delivered vaginally, IUFD stillborns 09/09/2015   IUFD  22 + weeks  still borns      Past Surgical History:  Procedure Laterality Date   DILATION AND EVACUATION N/A 09/09/2015   Procedure: DILATATION AND EVACUATION;  Surgeon: Krystal Deaner, MD;  Location: [facility];  Service: Gynecology;  Laterality: N/A;   TYMPANOSTOMY TUBE PLACEMENT     asage 2-3 yrs     Family History  Problem Relation Age of Onset   Diabetes Mother    Arthritis Mother    Stroke Father    Hypertension Father    Alcohol  abuse Father     Alcoholism Father     Social History   Tobacco Use   Smoking status: Former    Types: Cigars   Smokeless tobacco: Former   Tobacco comments:    1 cigarette a day  Substance Use Topics   Alcohol  use: Not Currently     Procedures   If procedures were preformed on this patient, they are listed below:  Procedures   Electronically signed by:   Glendia Carlin Ancona, M.D. PGY-2, Emergency Medicine   Please note that this documentation was produced with the assistance of voice-to-text technology and may contain errors.    Ancona Glendia, MD 03/30/24 2302    Francesca Elsie CROME, MD 03/30/24 [phone]  "

## 2024-03-30 NOTE — ED Provider Triage Note (Signed)
 Emergency Medicine Provider Triage Evaluation Note  Hulda Soha Thorup , a 33 y.o. female  was evaluated in triage.  Pt complains of left lower quadrant/flank pain, malodorous urine.  Symptoms have been ongoing for several days, reports pain was so severe last night that she could not sleep.  Denies dysuria, hematuria.  Endorses nausea but no vomiting.  Review of Systems  Positive: As above Negative: As above  Physical Exam  BP 98/67   Pulse 97   Temp 98.4 F (36.9 C)   Resp 16   LMP 03/28/2024 (Approximate)   SpO2 100%  Gen:   Awake, no distress   Resp:  Normal effort  MSK:   Moves extremities without difficulty  Other:  Abdomen is soft and nontender on exam, mild L CVA tenderness  Medical Decision Making  Medically screening exam initiated at 12:21 PM.  Appropriate orders placed.  Liboria Pranavi Aure was informed that the remainder of the evaluation will be completed by another provider, this initial triage assessment does not replace that evaluation, and the importance of remaining in the ED until their evaluation is complete.     Glendia Rocky SAILOR, [place] 03/30/24 1222

## 2024-03-30 NOTE — ED Triage Notes (Signed)
 Pt c.o LLQ pain, fever, chills, and lightheaded for the past few days. Some nausea, no vomiting. Pt also c.o foul odor to her urine.

## 2024-03-30 NOTE — Discharge Instructions (Addendum)
 You were seen today for flank pain. While you were here we monitored your vitals, preformed a physical exam, and labs and imaging. These were all reassuring and there is no indication for any further testing or intervention in the emergency department at this time.   Things to do:  - Follow up with your primary care provider within the next 1-2 weeks - Please pick up your prescription for metronidazole  and cefuroxime .  Take them as prescribed.  Follow-up with your PCP in 1-2 weeks for repeat UA and wet prep  Return to the emergency department if you have any new or worsening symptoms including worsening flank pain, continued symptoms despite multiple days of antibiotic treatment, heavy vaginal bleeding or vaginal pain, significant worsening dysuria, or if you have any other concerns.

## 2024-03-31 LAB — GC/CHLAMYDIA PROBE AMP (~~LOC~~) NOT AT ARMC
Chlamydia: NEGATIVE
Comment: NEGATIVE
Comment: NORMAL
Neisseria Gonorrhea: NEGATIVE
